# Patient Record
Sex: Male | Born: 1950 | Race: Asian | Hispanic: No | Marital: Married | Smoking: Never smoker
Health system: Southern US, Community
[De-identification: ages and names within clinical notes are randomized; demographics above are authoritative.]

## PROBLEM LIST (undated history)

## (undated) DIAGNOSIS — G8929 Other chronic pain: Secondary | ICD-10-CM

## (undated) DIAGNOSIS — N400 Enlarged prostate without lower urinary tract symptoms: Secondary | ICD-10-CM

## (undated) DIAGNOSIS — K219 Gastro-esophageal reflux disease without esophagitis: Secondary | ICD-10-CM

## (undated) DIAGNOSIS — M1990 Unspecified osteoarthritis, unspecified site: Secondary | ICD-10-CM

## (undated) DIAGNOSIS — E119 Type 2 diabetes mellitus without complications: Secondary | ICD-10-CM

## (undated) DIAGNOSIS — I10 Essential (primary) hypertension: Secondary | ICD-10-CM

## (undated) DIAGNOSIS — E782 Mixed hyperlipidemia: Secondary | ICD-10-CM

## (undated) DIAGNOSIS — M549 Dorsalgia, unspecified: Secondary | ICD-10-CM

## (undated) HISTORY — DX: Benign prostatic hyperplasia without lower urinary tract symptoms: N40.0

## (undated) HISTORY — DX: Type 2 diabetes mellitus without complications: E11.9

## (undated) HISTORY — DX: Essential (primary) hypertension: I10

## (undated) HISTORY — DX: Mixed hyperlipidemia: E78.2

## (undated) HISTORY — DX: Other chronic pain: M54.9

## (undated) HISTORY — DX: Other chronic pain: G89.29

## (undated) HISTORY — PX: CATARACT EXTRACTION: SUR2

---

## 2013-12-14 NOTE — Patient Instructions (Addendum)
Your procedure is scheduled on: 12/20/2013  Report to [facility] at  800   AM.  Call this number if you have problems the morning of surgery: [phone]   Do not eat food or drink liquids :After Midnight.      Take these medicines the morning of surgery with A SIP OF WATER: neurontin, claritin, zantac   Do not wear jewelry, make-up or nail polish.  Do not wear lotions, powders, or perfumes.   Do not shave 48 hours prior to surgery.  Do not bring valuables to the hospital.  Contacts, dentures or bridgework may not be worn into surgery.  Leave suitcase in the car. After surgery it may be brought to your room.  For patients admitted to the hospital, checkout time is 11:00 AM the day of discharge.   Patients discharged the day of surgery will not be allowed to drive home.  :     Please read over the following fact sheets that you were given: Coughing and Deep Breathing, Surgical Site Infection Prevention, Anesthesia Post-op Instructions and Care and Recovery After Surgery    Cataract A cataract is a clouding of the lens of the eye. When a lens becomes cloudy, vision is reduced based on the degree and nature of the clouding. Many cataracts reduce vision to some degree. Some cataracts make people more near-sighted as they develop. Other cataracts increase glare. Cataracts that are ignored and become worse can sometimes look white. The white color can be seen through the pupil. CAUSES   Aging. However, cataracts may occur at any age, even in newborns.   Certain drugs.   Trauma to the eye.   Certain diseases such as diabetes.   Specific eye diseases such as chronic inflammation inside the eye or a sudden attack of a rare form of glaucoma.   Inherited or acquired medical problems.  SYMPTOMS   Gradual, progressive drop in vision in the affected eye.   Severe, rapid visual loss. This most often happens when trauma is the cause.  DIAGNOSIS  To detect a cataract, an eye doctor examines  the lens. Cataracts are best diagnosed with an exam of the eyes with the pupils enlarged (dilated) by drops.  TREATMENT  For an early cataract, vision may improve by using different eyeglasses or stronger lighting. If that does not help your vision, surgery is the only effective treatment. A cataract needs to be surgically removed when vision loss interferes with your everyday activities, such as driving, reading, or watching TV. A cataract may also have to be removed if it prevents examination or treatment of another eye problem. Surgery removes the cloudy lens and usually replaces it with a substitute lens (intraocular lens, IOL).  At a time when both you and your doctor agree, the cataract will be surgically removed. If you have cataracts in both eyes, only one is usually removed at a time. This allows the operated eye to heal and be out of danger from any possible problems after surgery (such as infection or poor wound healing). In rare cases, a cataract may be doing damage to your eye. In these cases, your caregiver may advise surgical removal right away. The vast majority of people who have cataract surgery have better vision afterward. HOME CARE INSTRUCTIONS  If you are not planning surgery, you may be asked to do the following:  Use different eyeglasses.   Use stronger or brighter lighting.   Ask your eye doctor about reducing your medicine dose or  changing medicines if it is thought that a medicine caused your cataract. Changing medicines does not make the cataract go away on its own.   Become familiar with your surroundings. Poor vision can lead to injury. Avoid bumping into things on the affected side. You are at a higher risk for tripping or falling.   Exercise extreme care when driving or operating machinery.   Wear sunglasses if you are sensitive to bright light or experiencing problems with glare.  SEEK IMMEDIATE MEDICAL CARE IF:   You have a worsening or sudden vision loss.    You notice redness, swelling, or increasing pain in the eye.   You have a fever.  Document Released: 07/22/2005 Document Revised: 07/11/2011 Document Reviewed: 03/15/2011 [facility] Patient Information 2012 [place].PATIENT INSTRUCTIONS POST-ANESTHESIA  IMMEDIATELY FOLLOWING SURGERY:  Do not drive or operate machinery for the first twenty four hours after surgery.  Do not make any important decisions for twenty four hours after surgery or while taking narcotic pain medications or sedatives.  If you develop intractable nausea and vomiting or a severe headache please notify your doctor immediately.  FOLLOW-UP:  Please make an appointment with your surgeon as instructed. You do not need to follow up with anesthesia unless specifically instructed to do so.  WOUND CARE INSTRUCTIONS (if applicable):  Keep a dry clean dressing on the anesthesia/puncture wound site if there is drainage.  Once the wound has quit draining you may leave it open to air.  Generally you should leave the bandage intact for twenty four hours unless there is drainage.  If the epidural site drains for more than 36-48 hours please call the anesthesia department.  QUESTIONS?:  Please feel free to call your physician or the hospital operator if you have any questions, and they will be happy to assist you.

## 2013-12-15 ENCOUNTER — Encounter (HOSPITAL_COMMUNITY): Payer: Self-pay

## 2013-12-15 ENCOUNTER — Encounter (HOSPITAL_COMMUNITY)
Admission: RE | Admit: 2013-12-15 | Discharge: 2013-12-15 | Disposition: A | Discharge status: Home / Self Care | Source: Ambulatory Visit | Attending: Ophthalmology | Admitting: Ophthalmology

## 2013-12-15 ENCOUNTER — Encounter (HOSPITAL_COMMUNITY): Payer: Self-pay | Admitting: Pharmacy Technician

## 2013-12-15 DIAGNOSIS — Z01812 Encounter for preprocedural laboratory examination: Secondary | ICD-10-CM | POA: Insufficient documentation

## 2013-12-15 DIAGNOSIS — Z01810 Encounter for preprocedural cardiovascular examination: Secondary | ICD-10-CM | POA: Diagnosis not present

## 2013-12-15 HISTORY — DX: Gastro-esophageal reflux disease without esophagitis: K21.9

## 2013-12-15 HISTORY — DX: Type 2 diabetes mellitus without complications: E11.9

## 2013-12-15 HISTORY — DX: Unspecified osteoarthritis, unspecified site: M19.90

## 2013-12-15 LAB — BASIC METABOLIC PANEL
BUN: 27 mg/dL — ABNORMAL HIGH (ref 6–23)
CO2: 27 meq/L (ref 19–32)
Calcium: 10.4 mg/dL (ref 8.4–10.5)
Chloride: 100 mEq/L (ref 96–112)
Creatinine, Ser: 0.88 mg/dL (ref 0.50–1.35)
GFR calc Af Amer: >90 mL/min (ref >90)
GFR calc non Af Amer: >90 mL/min (ref >90)
Glucose, Bld: 207 mg/dL — ABNORMAL HIGH (ref 70–99)
Potassium: 5 mEq/L (ref 3.7–5.3)
SODIUM: 139 meq/L (ref 137–147)

## 2013-12-15 LAB — HEMOGLOBIN AND HEMATOCRIT, BLOOD
HEMATOCRIT: 40.3 % (ref 39.0–52.0)
HEMOGLOBIN: 13.6 g/dL (ref 13.0–17.0)

## 2013-12-15 NOTE — Pre-Procedure Instructions (Signed)
Patient given information to sign up for my chart at home. 

## 2013-12-20 ENCOUNTER — Ambulatory Visit (HOSPITAL_COMMUNITY): Admitting: Anesthesiology

## 2013-12-20 ENCOUNTER — Ambulatory Visit (HOSPITAL_COMMUNITY)
Admission: RE | Admit: 2013-12-20 | Discharge: 2013-12-20 | Disposition: A | Discharge status: Home / Self Care | Source: Ambulatory Visit | Attending: Ophthalmology | Admitting: Ophthalmology

## 2013-12-20 ENCOUNTER — Encounter (HOSPITAL_COMMUNITY): Admitting: Anesthesiology

## 2013-12-20 ENCOUNTER — Encounter (HOSPITAL_COMMUNITY)
Admission: RE | Disposition: A | Discharge status: Home / Self Care | Payer: Self-pay | Source: Ambulatory Visit | Attending: Ophthalmology

## 2013-12-20 ENCOUNTER — Encounter (HOSPITAL_COMMUNITY): Payer: Self-pay | Admitting: *Deleted

## 2013-12-20 DIAGNOSIS — H524 Presbyopia: Secondary | ICD-10-CM | POA: Insufficient documentation

## 2013-12-20 DIAGNOSIS — E1139 Type 2 diabetes mellitus with other diabetic ophthalmic complication: Secondary | ICD-10-CM | POA: Diagnosis not present

## 2013-12-20 DIAGNOSIS — K219 Gastro-esophageal reflux disease without esophagitis: Secondary | ICD-10-CM | POA: Insufficient documentation

## 2013-12-20 DIAGNOSIS — E11319 Type 2 diabetes mellitus with unspecified diabetic retinopathy without macular edema: Secondary | ICD-10-CM | POA: Insufficient documentation

## 2013-12-20 DIAGNOSIS — H52209 Unspecified astigmatism, unspecified eye: Secondary | ICD-10-CM | POA: Insufficient documentation

## 2013-12-20 DIAGNOSIS — I10 Essential (primary) hypertension: Secondary | ICD-10-CM | POA: Insufficient documentation

## 2013-12-20 DIAGNOSIS — H5210 Myopia, unspecified eye: Secondary | ICD-10-CM | POA: Insufficient documentation

## 2013-12-20 DIAGNOSIS — H2510 Age-related nuclear cataract, unspecified eye: Secondary | ICD-10-CM | POA: Diagnosis present

## 2013-12-20 HISTORY — PX: CATARACT EXTRACTION W/PHACO: SHX586

## 2013-12-20 LAB — GLUCOSE, CAPILLARY: GLUCOSE-CAPILLARY: 177 mg/dL — AB (ref 70–99)

## 2013-12-20 SURGERY — PHACOEMULSIFICATION, CATARACT, WITH IOL INSERTION
Anesthesia: Monitor Anesthesia Care | Site: Eye | Laterality: Left

## 2013-12-20 MED ORDER — LIDOCAINE HCL 3.5 % OP GEL
1.0000 "application " | Freq: Once | OPHTHALMIC | Status: AC
  Administered 2013-12-20: 1 via OPHTHALMIC

## 2013-12-20 MED ORDER — LIDOCAINE HCL 3.5 % OP GEL
OPHTHALMIC | Status: DC | PRN
  Administered 2013-12-20: 1 via OPHTHALMIC

## 2013-12-20 MED ORDER — CYCLOPENTOLATE-PHENYLEPHRINE OP SOLN OPTIME - NO CHARGE
OPHTHALMIC | Status: AC
  Filled 2013-12-20: qty 2

## 2013-12-20 MED ORDER — FENTANYL CITRATE 0.05 MG/ML IJ SOLN
25.0000 ug | INTRAMUSCULAR | Status: AC
  Administered 2013-12-20 (×2): 25 ug via INTRAVENOUS

## 2013-12-20 MED ORDER — FENTANYL CITRATE 0.05 MG/ML IJ SOLN
INTRAMUSCULAR | Status: AC
  Filled 2013-12-20: qty 2

## 2013-12-20 MED ORDER — CYCLOPENTOLATE-PHENYLEPHRINE 0.2-1 % OP SOLN
1.0000 [drp] | OPHTHALMIC | Status: AC
  Administered 2013-12-20 (×3): 1 [drp] via OPHTHALMIC

## 2013-12-20 MED ORDER — LACTATED RINGERS IV SOLN
INTRAVENOUS | Status: DC
  Administered 2013-12-20: 08:00:00 via INTRAVENOUS

## 2013-12-20 MED ORDER — POVIDONE-IODINE 5 % OP SOLN
OPHTHALMIC | Status: DC | PRN
  Administered 2013-12-20: 1 via OPHTHALMIC

## 2013-12-20 MED ORDER — MIDAZOLAM HCL 2 MG/2ML IJ SOLN
INTRAMUSCULAR | Status: AC
  Filled 2013-12-20: qty 2

## 2013-12-20 MED ORDER — EPINEPHRINE HCL 1 MG/ML IJ SOLN
INTRAOCULAR | Status: DC | PRN
  Administered 2013-12-20: 09:00:00

## 2013-12-20 MED ORDER — TETRACAINE HCL 0.5 % OP SOLN
OPHTHALMIC | Status: AC
  Filled 2013-12-20: qty 2

## 2013-12-20 MED ORDER — TETRACAINE HCL 0.5 % OP SOLN
1.0000 [drp] | OPHTHALMIC | Status: AC
  Administered 2013-12-20 (×3): 1 [drp] via OPHTHALMIC

## 2013-12-20 MED ORDER — MIDAZOLAM HCL 2 MG/2ML IJ SOLN
1.0000 mg | INTRAMUSCULAR | Status: DC | PRN
  Administered 2013-12-20: 2 mg via INTRAVENOUS

## 2013-12-20 MED ORDER — NA HYALUR & NA CHOND-NA HYALUR 0.55-0.5 ML IO KIT
PACK | INTRAOCULAR | Status: DC | PRN
  Administered 2013-12-20: 1 via OPHTHALMIC

## 2013-12-20 MED ORDER — BSS IO SOLN
INTRAOCULAR | Status: DC | PRN
  Administered 2013-12-20: 15 mL via INTRAOCULAR

## 2013-12-20 MED ORDER — LIDOCAINE HCL 3.5 % OP GEL
OPHTHALMIC | Status: AC
  Filled 2013-12-20: qty 1

## 2013-12-20 MED ORDER — TETRACAINE 0.5 % OP SOLN OPTIME - NO CHARGE
OPHTHALMIC | Status: DC | PRN
  Administered 2013-12-20: 1 [drp] via OPHTHALMIC

## 2013-12-20 MED ORDER — EPINEPHRINE HCL 1 MG/ML IJ SOLN
INTRAMUSCULAR | Status: AC
  Filled 2013-12-20: qty 1

## 2013-12-20 SURGICAL SUPPLY — 27 items
CAPSULAR TENSION RING-AMO (OPHTHALMIC RELATED) IMPLANT
CLOTH BEACON ORANGE TIMEOUT ST (SAFETY) ×3 IMPLANT
GLOVE BIO SURGEON STRL SZ7.5 (GLOVE) IMPLANT
GLOVE BIOGEL M 6.5 STRL (GLOVE) IMPLANT
GLOVE BIOGEL PI IND STRL 6.5 (GLOVE) ×1 IMPLANT
GLOVE BIOGEL PI IND STRL 7.0 (GLOVE) ×1 IMPLANT
GLOVE BIOGEL PI INDICATOR 6.5 (GLOVE) ×2
GLOVE BIOGEL PI INDICATOR 7.0 (GLOVE) ×2
GLOVE ECLIPSE 6.5 STRL STRAW (GLOVE) IMPLANT
GLOVE ECLIPSE 7.5 STRL STRAW (GLOVE) IMPLANT
GLOVE EXAM NITRILE LRG STRL (GLOVE) IMPLANT
GLOVE EXAM NITRILE MD LF STRL (GLOVE) IMPLANT
GLOVE SKINSENSE NS SZ6.5 (GLOVE)
GLOVE SKINSENSE NS SZ7.0 (GLOVE)
GLOVE SKINSENSE STRL SZ6.5 (GLOVE) IMPLANT
GLOVE SKINSENSE STRL SZ7.0 (GLOVE) IMPLANT
INST SET CATARACT ~~LOC~~ (KITS) ×3 IMPLANT
KIT VITRECTOMY (OPHTHALMIC RELATED) IMPLANT
PAD ARMBOARD 7.5X6 YLW CONV (MISCELLANEOUS) ×3 IMPLANT
PROC W NO LENS (INTRAOCULAR LENS)
PROC W SPEC LENS (INTRAOCULAR LENS)
PROCESS W NO LENS (INTRAOCULAR LENS) IMPLANT
PROCESS W SPEC LENS (INTRAOCULAR LENS) IMPLANT
RING MALYGIN (MISCELLANEOUS) IMPLANT
SIGHTPATH CAT PROC W REG LENS (Ophthalmic Related) ×3 IMPLANT
VISCOELASTIC ADDITIONAL (OPHTHALMIC RELATED) IMPLANT
WATER STERILE IRR 250ML POUR (IV SOLUTION) ×3 IMPLANT

## 2013-12-20 NOTE — H&P (Signed)
I have reviewed the pre printed H&P, the patient was re-examined, and I have identified no significant interval changes in the patient's medical condition.  There is no change in the plan of care since the history and physical of record. 

## 2013-12-20 NOTE — Op Note (Signed)
See scanned op note 

## 2013-12-20 NOTE — Transfer of Care (Signed)
Immediate Anesthesia Transfer of Care Note  Patient: Jimmy CowdenMuhammad Romm  Procedure(s) Performed: Procedure(s) with comments: CATARACT EXTRACTION PHACO AND INTRAOCULAR LENS PLACEMENT (IOC) (Left) - CDE: 0.97  Patient Location: Short Stay  Anesthesia Type:MAC  Level of Consciousness: awake, alert , oriented and patient cooperative  Airway & Oxygen Therapy: Patient Spontanous Breathing  Post-op Assessment: Report given to PACU RN, Post -op Vital signs reviewed and stable and Patient moving all extremities  Post vital signs: Reviewed and stable  Complications: No apparent anesthesia complications

## 2013-12-20 NOTE — Discharge Instructions (Signed)
Jimmy CowdenMuhammad Craig 12/20/2013 Dr. Lita MainsHaines Post operative Instructions for Cataract Patients  These instructions [facility] and pertain to the operative eye.  1.  Resume your normal diet and previous oral medicines.  2. Your Follow-up appointment is at Dr. Lita MainsHaines' office in [place] on 12/21/2013 at 2:45 pm.  3. You may leave the hospital when your driver is present and your nurse releases you.  4. Begin Pred Forte (prednisolone acetate 1%), Acular LS (ketorolac tromethamine .4%) and Gatifloxacin 0.5% eye drops; 1 drop each 4 times daily to operative eye. Begin 3 hours after discharge from Short Stay Unit.  Moxifloxacin 0.5% may be substituted for Gatifloxacin using the same instructions.  5. Page Dr. Lita MainsHaines via beeper [phone] for significant pain in or around operative eye that is not relieved by Tylenol.  6. If you took Plavix before surgery, restart it at the usual dose on the evening of surgery.  7. Wear dark glasses as necessary for excessive light sensitivity.  8. Do no forcefully rub you your operative eye.  9. Keep your operative eye dry for 1 week. You may gently clean your eyelids with a damp washcloth.  10. You may resume normal occupational activities in one week and resume driving as tolerated after the first post operative visit.  11. It is normal to have blurred vision and a scratchy sensation following surgery.  Dr. Lita MainsHaines: [phone]

## 2013-12-20 NOTE — Brief Op Note (Signed)
12/20/2013  9:47 AM  PATIENT:  Jimmy Craig  63 y.o. male  PRE-OPERATIVE DIAGNOSIS:  nuclear cataract left eye  POST-OPERATIVE DIAGNOSIS:  nuclear cataract left eye  PROCEDURE:  Procedure(s): CATARACT EXTRACTION PHACO AND INTRAOCULAR LENS PLACEMENT (IOC)  SURGEON:  Surgeon(s): Susa Simmondsarroll F Kenedee Molesky, MD  ASSISTANTS: Corky Singwendy Kendricfk, CST   ANESTHESIA STAFF: Anesthesiologist: Laurene FootmanLuis Gonzalez, MD CRNA: Despina Hiddenobert J Idacavage, CRNA  ANESTHESIA:   topical and MAC  REQUESTED LENS POWER: 17.50  LENS IMPLANT INFORMATION:  Alcon [id number]  +17.50  S/n [id number]  Exp 06/2014  CUMULATIVE DISSIPATED ENERGY:0.97  INDICATIONS:see office H&P  OP FINDINGS:dense NS  COMPLICATIONS:None  DICTATION #: none  PLAN OF CARE: as above  PATIENT DISPOSITION:  Short Stay

## 2013-12-20 NOTE — Anesthesia Postprocedure Evaluation (Signed)
  Anesthesia Post-op Note  Patient: Jimmy Craig  Procedure(s) Performed: Procedure(s) with comments: CATARACT EXTRACTION PHACO AND INTRAOCULAR LENS PLACEMENT (IOC) (Left) - CDE: 0.97  Patient Location: Short Stay  Anesthesia Type:MAC  Level of Consciousness: awake, alert , oriented and patient cooperative  Airway and Oxygen Therapy: Patient Spontanous Breathing  Post-op Pain: none  Post-op Assessment: Post-op Vital signs reviewed, Patient's Cardiovascular Status Stable, Respiratory Function Stable, Patent Airway and Pain level controlled  Post-op Vital Signs: Reviewed and stable  Last Vitals:  Filed Vitals:   12/20/13 0840  BP: 112/70  Temp:   Resp: 27    Complications: No apparent anesthesia complications

## 2013-12-20 NOTE — Anesthesia Preprocedure Evaluation (Addendum)
Anesthesia Evaluation  Patient identified by MRN, date of birth, ID band  Reviewed: Allergy & Precautions, H&P , NPO status , Patient's Chart, lab work & pertinent test results  Airway Mallampati: II TM Distance: >3 FB     Dental  (+) Teeth Intact   Pulmonary neg pulmonary ROS,  breath sounds clear to auscultation        Cardiovascular negative cardio ROS  Rhythm:Regular Rate:Normal     Neuro/Psych    GI/Hepatic GERD-  Medicated and Controlled,  Endo/Other  diabetes, Type 2, Oral Hypoglycemic Agents  Renal/GU      Musculoskeletal   Abdominal   Peds  Hematology   Anesthesia Other Findings   Reproductive/Obstetrics                         Anesthesia Physical Anesthesia Plan  ASA: II  Anesthesia Plan: MAC   Post-op Pain Management:    Induction: Intravenous  Airway Management Planned: Nasal Cannula  Additional Equipment:   Intra-op Plan:   Post-operative Plan:   Informed Consent: I have reviewed the patients History and Physical, chart, labs and discussed the procedure including the risks, benefits and alternatives for the proposed anesthesia with the patient or authorized representative who has indicated his/her understanding and acceptance.     Plan Discussed with:   Anesthesia Plan Comments:         Anesthesia Quick Evaluation

## 2013-12-21 ENCOUNTER — Encounter (HOSPITAL_COMMUNITY): Payer: Self-pay | Admitting: Ophthalmology

## 2014-11-25 ENCOUNTER — Encounter: Payer: Self-pay | Admitting: Family

## 2014-12-08 DIAGNOSIS — R0602 Shortness of breath: Secondary | ICD-10-CM | POA: Insufficient documentation

## 2014-12-13 ENCOUNTER — Encounter: Payer: Self-pay | Admitting: Family

## 2015-01-20 DIAGNOSIS — R42 Dizziness and giddiness: Secondary | ICD-10-CM | POA: Insufficient documentation

## 2015-08-30 ENCOUNTER — Encounter: Payer: Self-pay | Admitting: Family

## 2017-07-02 ENCOUNTER — Encounter: Payer: Self-pay | Admitting: Family

## 2017-08-08 DIAGNOSIS — M48061 Spinal stenosis, lumbar region without neurogenic claudication: Secondary | ICD-10-CM | POA: Insufficient documentation

## 2017-11-18 ENCOUNTER — Encounter: Payer: Self-pay | Admitting: Family

## 2018-08-14 ENCOUNTER — Ambulatory Visit (INDEPENDENT_AMBULATORY_CARE_PROVIDER_SITE_OTHER): Payer: PRIVATE HEALTH INSURANCE | Admitting: Family

## 2018-08-14 ENCOUNTER — Encounter: Payer: Self-pay | Admitting: Family

## 2018-08-14 VITALS — BP 134/73 | HR 78 | Temp 97.0°F | Ht 65.0 in | Wt 152.2 lb

## 2018-08-14 DIAGNOSIS — Z1159 Encounter for screening for other viral diseases: Secondary | ICD-10-CM

## 2018-08-14 DIAGNOSIS — E1169 Type 2 diabetes mellitus with other specified complication: Secondary | ICD-10-CM

## 2018-08-14 DIAGNOSIS — Z23 Encounter for immunization: Secondary | ICD-10-CM

## 2018-08-14 DIAGNOSIS — N401 Enlarged prostate with lower urinary tract symptoms: Secondary | ICD-10-CM | POA: Diagnosis not present

## 2018-08-14 DIAGNOSIS — K219 Gastro-esophageal reflux disease without esophagitis: Secondary | ICD-10-CM

## 2018-08-14 DIAGNOSIS — N400 Enlarged prostate without lower urinary tract symptoms: Secondary | ICD-10-CM | POA: Insufficient documentation

## 2018-08-14 DIAGNOSIS — E119 Type 2 diabetes mellitus without complications: Secondary | ICD-10-CM | POA: Insufficient documentation

## 2018-08-14 DIAGNOSIS — R351 Nocturia: Secondary | ICD-10-CM

## 2018-08-14 DIAGNOSIS — D509 Iron deficiency anemia, unspecified: Secondary | ICD-10-CM | POA: Insufficient documentation

## 2018-08-14 DIAGNOSIS — E785 Hyperlipidemia, unspecified: Secondary | ICD-10-CM

## 2018-08-14 LAB — BAYER DCA HB A1C WAIVED: HB A1C (BAYER DCA - WAIVED): 10.6 % — ABNORMAL HIGH (ref <7.0)

## 2018-08-14 MED ORDER — ESOMEPRAZOLE MAGNESIUM 40 MG PO CPDR: 40.0000 mg | DELAYED_RELEASE_CAPSULE | Freq: Every day | ORAL | 1 refills | Status: DC

## 2018-08-14 MED ORDER — ATORVASTATIN CALCIUM 40 MG PO TABS: 40.0000 mg | ORAL_TABLET | Freq: Every day | ORAL | 1 refills | Status: DC

## 2018-08-14 MED ORDER — BLOOD GLUCOSE METER KIT: PACK | 0 refills | Status: DC

## 2018-08-14 NOTE — Progress Notes (Signed)
Subjective:    Patient ID: Jimmy Craig, male    DOB: November 27, 1950, 68 y.o.   MRN: [id number]  Chief Complaint  Patient presents with  . New Patient (Initial Visit)   Pt presents to the office today to establish care.  Pt states he has not been on his medications because of costs, but now has Medicaid. He has been buying Human Insulin OTC at [facility] and giving himself 40-50 units BID.  Diabetes  He presents for his follow-up diabetic visit. He has type 2 diabetes mellitus. His disease course has been stable. There are no hypoglycemic associated symptoms. Associated symptoms include visual change. Pertinent negatives for diabetes include no blurred vision, no chest pain, no foot paresthesias and no weight loss. There are no hypoglycemic complications. Symptoms are stable. Diabetic complications include peripheral neuropathy. Pertinent negatives for diabetic complications include no CVA or heart disease. Risk factors for coronary artery disease include dyslipidemia, diabetes mellitus, male sex, hypertension and sedentary lifestyle. He is following a generally unhealthy diet. His overall blood glucose range is 180-200 mg/dl. Eye exam is current.  Hyperlipidemia  This is a chronic problem. The current episode started more than 1 year ago. The problem is uncontrolled. Recent lipid tests were reviewed and are high. Pertinent negatives include no chest pain. Current antihyperlipidemic treatment includes statins. The current treatment provides moderate improvement of lipids. Risk factors for coronary artery disease include dyslipidemia, diabetes mellitus, male sex, hypertension and a sedentary lifestyle.  Gastroesophageal Reflux  He reports no chest pain, no heartburn or no hoarse voice. This is a chronic problem. The current episode started more than 1 year ago. The problem occurs occasionally. The problem has been waxing and waning. The symptoms are aggravated by certain foods. Pertinent negatives  include no weight loss. He has tried a PPI for the symptoms. The treatment provided moderate relief.  Anemia  Presents for follow-up visit. Symptoms include malaise/fatigue. There has been no anorexia, bruising/bleeding easily or weight loss.  Arthritis  Presents for follow-up visit. He complains of pain and stiffness. The symptoms have been stable. Affected locations include the right MCP and left MCP. His pain is at a severity of 8/10. Pertinent negatives include no weight loss.  Benign Prostatic Hypertrophy  This is a recurrent problem. The current episode started more than 1 month ago. The problem has been waxing and waning since onset. Irritative symptoms include nocturia (2-4). Obstructive symptoms include an intermittent stream, a slower stream and straining.  COPD PT taking Symbicort BID.    Review of Systems  Constitutional: Positive for malaise/fatigue. Negative for weight loss.  HENT: Negative for hoarse voice.   Eyes: Negative for blurred vision.  Cardiovascular: Negative for chest pain.  Gastrointestinal: Negative for anorexia and heartburn.  Genitourinary: Positive for nocturia (2-4).  Musculoskeletal: Positive for arthritis and stiffness.  Hematological: Does not bruise/bleed easily.  All other systems reviewed and are negative.  Family History  Problem Relation Age of Onset  . Diabetes Mother     Social History   Socioeconomic History  . Marital status: Single    Spouse name: Not on file  . Number of children: Not on file  . Years of education: Not on file  . Highest education level: Not on file  Occupational History  . Not on file  Social Needs  . Financial resource strain: Not on file  . Food insecurity:    Worry: Not on file    Inability: Not on file  . Transportation needs:  Medical: Not on file    Non-medical: Not on file  Tobacco Use  . Smoking status: Never Smoker  . Smokeless tobacco: Never Used  Substance and Sexual Activity  . Alcohol use:  No  . Drug use: No  . Sexual activity: Yes    Birth control/protection: None  Lifestyle  . Physical activity:    Days per week: Not on file    Minutes per session: Not on file  . Stress: Not on file  Relationships  . Social connections:    Talks on phone: Not on file    Gets together: Not on file    Attends religious service: Not on file    Active member of club or organization: Not on file    Attends meetings of clubs or organizations: Not on file    Relationship status: Not on file  Other Topics Concern  . Not on file  Social History Narrative  . Not on file       Objective:   Physical Exam Vitals signs reviewed.  Constitutional:      General: He is not in acute distress.    Appearance: He is well-developed.  HENT:     Head: Normocephalic.     Right Ear: External ear normal.     Left Ear: External ear normal.  Eyes:     General:        Right eye: No discharge.        Left eye: No discharge.     Pupils: Pupils are equal, round, and reactive to light.  Neck:     Musculoskeletal: Normal range of motion and neck supple.     Thyroid: No thyromegaly.  Cardiovascular:     Rate and Rhythm: Normal rate and regular rhythm.     Heart sounds: Normal heart sounds. No murmur.  Pulmonary:     Effort: Pulmonary effort is normal. No respiratory distress.     Breath sounds: Decreased breath sounds present. No wheezing.  Abdominal:     General: Bowel sounds are normal. There is no distension.     Palpations: Abdomen is soft.     Tenderness: There is no abdominal tenderness.  Musculoskeletal: Normal range of motion.        General: No tenderness.  Skin:    General: Skin is warm and dry.     Findings: No erythema or rash.  Neurological:     Mental Status: He is alert and oriented to person, place, and time.     Cranial Nerves: No cranial nerve deficit.     Deep Tendon Reflexes: Reflexes are normal and symmetric.  Psychiatric:        Behavior: Behavior normal.         Thought Content: Thought content normal.        Judgment: Judgment normal.       BP 134/73   Pulse 78   Temp (!) 97 F (36.1 C) (Oral)   Ht 5' 5"  (1.651 m)   Wt 152 lb 3.2 oz (69 kg)   BMI 25.33 kg/m      Assessment & Plan:  Jimmy Craig comes in today with chief complaint of New Patient (Initial Visit)   Diagnosis and orders addressed:  1. Type 2 diabetes mellitus with other specified complication, without long-term current use of insulin (HCC) - CMP14+EGFR - Bayer DCA Hb A1c Waived - Microalbumin / creatinine urine ratio - blood glucose meter kit and supplies; Dispense based on patient and insurance preference. Use  up to four times daily as directed. (FOR ICD-10 E10.9, E11.9).  Dispense: 1 each; Refill: 0  2. Hyperlipidemia associated with type 2 diabetes mellitus (HCC) - CMP14+EGFR - Lipid panel - atorvastatin (LIPITOR) 40 MG tablet; Take 1 tablet (40 mg total) by mouth daily at 6 PM.  Dispense: 90 tablet; Refill: 1  3. GERD without esophagitis - CMP14+EGFR - esomeprazole (NEXIUM) 40 MG capsule; Take 1 capsule (40 mg total) by mouth daily at 12 noon.  Dispense: 90 capsule; Refill: 1  4. Benign prostatic hyperplasia with nocturia - CMP14+EGFR - PSA, total and free  5. Need for hepatitis C screening test - CMP14+EGFR - Hepatitis C antibody  6. Iron deficiency anemia, unspecified iron deficiency anemia type - Anemia Profile B  7. Encounter for immunization - Flu vaccine HIGH DOSE PF   Labs pending Health Maintenance reviewed- Will get Diabetic eye exam and colonoscopy results faxed to our office. Diet and exercise encouraged  Follow up plan: 1 month    Evelina Dun, FNP

## 2018-08-14 NOTE — Patient Instructions (Signed)
Diabetes Mellitus and Nutrition, Adult  When you have diabetes (diabetes mellitus), it is very important to have healthy eating habits because your blood sugar (glucose) levels are greatly affected by what you eat and drink. Eating healthy foods in the appropriate amounts, at about the same times every day, can help you:  · Control your blood glucose.  · Lower your risk of heart disease.  · Improve your blood pressure.  · Reach or maintain a healthy weight.  Every person with diabetes is different, and each person has different needs for a meal plan. Your health care provider may recommend that you work with a diet and nutrition specialist (dietitian) to make a meal plan that is best for you. Your meal plan may vary depending on factors such as:  · The calories you need.  · The medicines you take.  · Your weight.  · Your blood glucose, blood pressure, and cholesterol levels.  · Your activity level.  · Other health conditions you have, such as heart or kidney disease.  How do carbohydrates affect me?  Carbohydrates, also called carbs, affect your blood glucose level more than any other type of food. Eating carbs naturally raises the amount of glucose in your blood. Carb counting is a method for keeping track of how many carbs you eat. Counting carbs is important to keep your blood glucose at a healthy level, especially if you use insulin or take certain oral diabetes medicines.  It is important to know how many carbs you can safely have in each meal. This is different for every person. Your dietitian can help you calculate how many carbs you should have at each meal and for each snack.  Foods that contain carbs include:  · Bread, cereal, rice, pasta, and crackers.  · Potatoes and corn.  · Peas, beans, and lentils.  · Milk and yogurt.  · Fruit and juice.  · Desserts, such as cakes, cookies, ice cream, and candy.  How does alcohol affect me?  Alcohol can cause a sudden decrease in blood glucose (hypoglycemia),  especially if you use insulin or take certain oral diabetes medicines. Hypoglycemia can be a life-threatening condition. Symptoms of hypoglycemia (sleepiness, dizziness, and confusion) are similar to symptoms of having too much alcohol.  If your health care provider says that alcohol is safe for you, follow these guidelines:  · Limit alcohol intake to no more than 1 drink per day for nonpregnant women and 2 drinks per day for men. One drink equals 12 oz of beer, 5 oz of wine, or 1½ oz of hard liquor.  · Do not drink on an empty stomach.  · Keep yourself hydrated with water, diet soda, or unsweetened iced tea.  · Keep in mind that regular soda, juice, and other mixers may contain a lot of sugar and must be counted as carbs.  What are tips for following this plan?    Reading food labels  · Start by checking the serving size on the "Nutrition Facts" label of packaged foods and drinks. The amount of calories, carbs, fats, and other nutrients listed on the label is based on one serving of the item. Many items contain more than one serving per package.  · Check the total grams (g) of carbs in one serving. You can calculate the number of servings of carbs in one serving by dividing the total carbs by 15. For example, if a food has 30 g of total carbs, it would be equal to 2   servings of carbs.  · Check the number of grams (g) of saturated and trans fats in one serving. Choose foods that have low or no amount of these fats.  · Check the number of milligrams (mg) of salt (sodium) in one serving. Most people should limit total sodium intake to less than 2,300 mg per day.  · Always check the nutrition information of foods labeled as "low-fat" or "nonfat". These foods may be higher in added sugar or refined carbs and should be avoided.  · Talk to your dietitian to identify your daily goals for nutrients listed on the label.  Shopping  · Avoid buying canned, premade, or processed foods. These foods tend to be high in fat, sodium,  and added sugar.  · Shop around the outside edge of the grocery store. This includes fresh fruits and vegetables, bulk grains, fresh meats, and fresh dairy.  Cooking  · Use low-heat cooking methods, such as baking, instead of high-heat cooking methods like deep frying.  · Cook using healthy oils, such as olive, canola, or sunflower oil.  · Avoid cooking with butter, cream, or high-fat meats.  Meal planning  · Eat meals and snacks regularly, preferably at the same times every day. Avoid going long periods of time without eating.  · Eat foods high in fiber, such as fresh fruits, vegetables, beans, and whole grains. Talk to your dietitian about how many servings of carbs you can eat at each meal.  · Eat 4-6 ounces (oz) of lean protein each day, such as lean meat, chicken, fish, eggs, or tofu. One oz of lean protein is equal to:  ? 1 oz of meat, chicken, or fish.  ? 1 egg.  ? ¼ cup of tofu.  · Eat some foods each day that contain healthy fats, such as avocado, nuts, seeds, and fish.  Lifestyle  · Check your blood glucose regularly.  · Exercise regularly as told by your health care provider. This may include:  ? 150 minutes of moderate-intensity or vigorous-intensity exercise each week. This could be brisk walking, biking, or water aerobics.  ? Stretching and doing strength exercises, such as yoga or weightlifting, at least 2 times a week.  · Take medicines as told by your health care provider.  · Do not use any products that contain nicotine or tobacco, such as cigarettes and e-cigarettes. If you need help quitting, ask your health care provider.  · Work with a counselor or diabetes educator to identify strategies to manage stress and any emotional and social challenges.  Questions to ask a health care provider  · Do I need to meet with a diabetes educator?  · Do I need to meet with a dietitian?  · What number can I call if I have questions?  · When are the best times to check my blood glucose?  Where to find more  information:  · American Diabetes Association: diabetes.org  · Academy of Nutrition and Dietetics: www.eatright.org  · National Institute of Diabetes and Digestive and Kidney Diseases (NIH): www.niddk.nih.gov  Summary  · A healthy meal plan will help you control your blood glucose and maintain a healthy lifestyle.  · Working with a diet and nutrition specialist (dietitian) can help you make a meal plan that is best for you.  · Keep in mind that carbohydrates (carbs) and alcohol have immediate effects on your blood glucose levels. It is important to count carbs and to use alcohol carefully.  This information is not intended to   replace advice given to you by your health care provider. Make sure you discuss any questions you have with your health care provider.  Document Released: 04/18/2005 Document Revised: 02/19/2017 Document Reviewed: 08/26/2016  Elsevier Interactive Patient Education © 2019 Elsevier Inc.

## 2018-08-15 LAB — ANEMIA PROFILE B
BASOS: 1 %
Basophils Absolute: 0.1 10*3/uL (ref 0.0–0.2)
EOS (ABSOLUTE): 0.1 10*3/uL (ref 0.0–0.4)
Eos: 2 %
FERRITIN: 75 ng/mL (ref 30–400)
Folate: >20 ng/mL (ref >3.0)
HEMOGLOBIN: 13.5 g/dL (ref 13.0–17.7)
Hematocrit: 39.8 % (ref 37.5–51.0)
IMMATURE GRANS (ABS): 0 10*3/uL (ref 0.0–0.1)
IRON SATURATION: 23 % (ref 15–55)
IRON: 84 ug/dL (ref 38–169)
Immature Granulocytes: 0 %
LYMPHS: 38 %
Lymphocytes Absolute: 2.2 10*3/uL (ref 0.7–3.1)
MCH: 30.2 pg (ref 26.6–33.0)
MCHC: 33.9 g/dL (ref 31.5–35.7)
MCV: 89 fL (ref 79–97)
Monocytes Absolute: 0.5 10*3/uL (ref 0.1–0.9)
Monocytes: 9 %
Neutrophils Absolute: 2.9 10*3/uL (ref 1.4–7.0)
Neutrophils: 50 %
Platelets: 309 10*3/uL (ref 150–450)
RBC: 4.47 x10E6/uL (ref 4.14–5.80)
RDW: 12.6 % (ref 11.6–15.4)
RETIC CT PCT: 1.4 % (ref 0.6–2.6)
TIBC: 373 ug/dL (ref 250–450)
UIBC: 289 ug/dL (ref 111–343)
Vitamin B-12: >2000 pg/mL — ABNORMAL HIGH (ref 232–1245)
WBC: 5.8 10*3/uL (ref 3.4–10.8)

## 2018-08-15 LAB — CMP14+EGFR
ALT: 45 IU/L — ABNORMAL HIGH (ref 0–44)
AST: 29 IU/L (ref 0–40)
Albumin/Globulin Ratio: 1.6 (ref 1.2–2.2)
Albumin: 4.6 g/dL (ref 3.6–4.8)
Alkaline Phosphatase: 89 IU/L (ref 39–117)
BILIRUBIN TOTAL: 0.3 mg/dL (ref 0.0–1.2)
BUN/Creatinine Ratio: 24 (ref 10–24)
BUN: 24 mg/dL (ref 8–27)
CO2: 22 mmol/L (ref 20–29)
CREATININE: 0.98 mg/dL (ref 0.76–1.27)
Calcium: 9.8 mg/dL (ref 8.6–10.2)
Chloride: 98 mmol/L (ref 96–106)
GFR calc Af Amer: 93 mL/min/{1.73_m2} (ref >59)
GFR calc non Af Amer: 81 mL/min/{1.73_m2} (ref >59)
GLOBULIN, TOTAL: 2.9 g/dL (ref 1.5–4.5)
Glucose: 294 mg/dL — ABNORMAL HIGH (ref 65–99)
Potassium: 5 mmol/L (ref 3.5–5.2)
Sodium: 138 mmol/L (ref 134–144)
TOTAL PROTEIN: 7.5 g/dL (ref 6.0–8.5)

## 2018-08-15 LAB — LIPID PANEL
Chol/HDL Ratio: 6.8 ratio — ABNORMAL HIGH (ref 0.0–5.0)
Cholesterol, Total: 298 mg/dL — ABNORMAL HIGH (ref 100–199)
HDL: 44 mg/dL (ref >39)
LDL CALC: 190 mg/dL — AB (ref 0–99)
TRIGLYCERIDES: 320 mg/dL — AB (ref 0–149)
VLDL Cholesterol Cal: 64 mg/dL — ABNORMAL HIGH (ref 5–40)

## 2018-08-15 LAB — HEPATITIS C ANTIBODY: Hep C Virus Ab: <0.1 s/co ratio (ref 0.0–0.9)

## 2018-08-15 LAB — PSA, TOTAL AND FREE
PSA, Free Pct: 36.7 %
PSA, Free: 0.11 ng/mL
Prostate Specific Ag, Serum: 0.3 ng/mL (ref 0.0–4.0)

## 2018-08-18 ENCOUNTER — Other Ambulatory Visit: Payer: Self-pay | Admitting: Family

## 2018-08-18 MED ORDER — METFORMIN HCL 1000 MG PO TABS: 1000.0000 mg | ORAL_TABLET | Freq: Two times a day (BID) | ORAL | 3 refills | Status: DC

## 2018-08-18 MED ORDER — INSULIN DEGLUDEC 100 UNIT/ML ~~LOC~~ SOPN: 15.0000 [IU] | PEN_INJECTOR | Freq: Every day | SUBCUTANEOUS | 2 refills | Status: DC

## 2018-08-20 ENCOUNTER — Telehealth: Payer: Self-pay

## 2018-08-20 ENCOUNTER — Telehealth: Payer: Self-pay | Admitting: Family

## 2018-08-20 LAB — HM DIABETES EYE EXAM

## 2018-08-20 MED ORDER — INSULIN DETEMIR 100 UNIT/ML FLEXPEN: 15.0000 [IU] | PEN_INJECTOR | Freq: Every day | SUBCUTANEOUS | 11 refills | Status: DC

## 2018-08-20 NOTE — Telephone Encounter (Signed)
His blood pressure was 162/82 and 154/78 yesterday.  Advised to decrease sodium and keepa log to report back to provider.  He still suffers with shoulder pain but was advised to stop tylenol due to liver results.  Please send in medicine to help with this pain.  This has been treated at the urgent care before. Thanks

## 2018-08-20 NOTE — Telephone Encounter (Signed)
Insurance denied prior auth for Tresiba  Needs to fail [employer] and [employer]

## 2018-08-20 NOTE — Telephone Encounter (Signed)
[name] changed to Jimmy Craig per insurance. Continue with 15 units every day.

## 2018-08-20 NOTE — Telephone Encounter (Signed)
Patient aware and verbalizes understanding. 

## 2018-08-21 ENCOUNTER — Telehealth: Payer: Self-pay | Admitting: Family

## 2018-08-21 DIAGNOSIS — E1169 Type 2 diabetes mellitus with other specified complication: Secondary | ICD-10-CM

## 2018-08-21 MED ORDER — DICLOFENAC SODIUM 75 MG PO TBEC: 75.0000 mg | DELAYED_RELEASE_TABLET | Freq: Two times a day (BID) | ORAL | 1 refills | Status: DC

## 2018-08-21 NOTE — Telephone Encounter (Signed)
Patient needs his insulin "flex pen needles" called in to [facility].

## 2018-08-21 NOTE — Telephone Encounter (Signed)
Pt aware.

## 2018-08-21 NOTE — Telephone Encounter (Signed)
Diclofenac Prescription sent to pharmacy. This is a NSAID, so do not take any other motrin or ibuprofen with this. Pain can cause increase in blood pressure. If it continues to be elevated we will need to see you in the office. Please make sure he has an appointment to see me in one month from his last appointment.

## 2018-08-24 MED ORDER — PEN NEEDLES 32G X 4 MM MISC: 1.0000 "application " | Freq: Every day | 11 refills | Status: DC

## 2018-08-24 MED ORDER — LISINOPRIL 10 MG PO TABS: 10.0000 mg | ORAL_TABLET | Freq: Every day | ORAL | 3 refills | Status: DC

## 2018-08-24 NOTE — Telephone Encounter (Signed)
PT in the office today with wife. His BP was 151/82, heart rate 94. He states he has been monitoring his BP at [facility] and it was been 150's-160's every time. He is worried about this.  We will send in Lisinopril 10 mg.

## 2018-09-17 ENCOUNTER — Telehealth: Payer: Self-pay | Admitting: Family

## 2018-09-25 NOTE — Telephone Encounter (Signed)
Multiple attempts made to contact patient.  Has upcoming appt on 09/29/2018.  This encounter will now be closed

## 2018-09-29 ENCOUNTER — Ambulatory Visit (INDEPENDENT_AMBULATORY_CARE_PROVIDER_SITE_OTHER): Payer: PRIVATE HEALTH INSURANCE | Admitting: Family

## 2018-09-29 ENCOUNTER — Encounter: Payer: Self-pay | Admitting: Family

## 2018-09-29 VITALS — BP 140/71 | HR 69 | Temp 97.3°F | Ht 65.0 in | Wt 148.8 lb

## 2018-09-29 DIAGNOSIS — M7582 Other shoulder lesions, left shoulder: Secondary | ICD-10-CM

## 2018-09-29 DIAGNOSIS — E1169 Type 2 diabetes mellitus with other specified complication: Secondary | ICD-10-CM

## 2018-09-29 DIAGNOSIS — I10 Essential (primary) hypertension: Secondary | ICD-10-CM

## 2018-09-29 DIAGNOSIS — E1159 Type 2 diabetes mellitus with other circulatory complications: Secondary | ICD-10-CM

## 2018-09-29 DIAGNOSIS — I152 Hypertension secondary to endocrine disorders: Secondary | ICD-10-CM | POA: Insufficient documentation

## 2018-09-29 MED ORDER — FLUTICASONE PROPIONATE 50 MCG/ACT NA SUSP: 2.0000 | Freq: Two times a day (BID) | NASAL | 1 refills | Status: DC

## 2018-09-29 MED ORDER — INSULIN DETEMIR 100 UNIT/ML FLEXPEN: 50.0000 [IU] | PEN_INJECTOR | Freq: Every day | SUBCUTANEOUS | 11 refills | Status: DC

## 2018-09-29 MED ORDER — CETIRIZINE HCL 10 MG PO TABS: 10.0000 mg | ORAL_TABLET | Freq: Every day | ORAL | 2 refills | Status: DC

## 2018-09-29 MED ORDER — ERTUGLIFLOZIN L-PYROGLUTAMICAC 15 MG PO TABS: 15.0000 mg | ORAL_TABLET | Freq: Every day | ORAL | 5 refills | Status: DC

## 2018-09-29 MED ORDER — LISINOPRIL 10 MG PO TABS: 10.0000 mg | ORAL_TABLET | Freq: Every day | ORAL | 3 refills | Status: DC

## 2018-09-29 MED ORDER — INSULIN DEGLUDEC 100 UNIT/ML ~~LOC~~ SOPN: 45.0000 [IU] | PEN_INJECTOR | Freq: Every day | SUBCUTANEOUS | 11 refills | Status: DC

## 2018-09-29 NOTE — Patient Instructions (Signed)
Diabetes Mellitus and Nutrition, Adult  When you have diabetes (diabetes mellitus), it is very important to have healthy eating habits because your blood sugar (glucose) levels are greatly affected by what you eat and drink. Eating healthy foods in the appropriate amounts, at about the same times every day, can help you:  · Control your blood glucose.  · Lower your risk of heart disease.  · Improve your blood pressure.  · Reach or maintain a healthy weight.  Every person with diabetes is different, and each person has different needs for a meal plan. Your health care provider may recommend that you work with a diet and nutrition specialist (dietitian) to make a meal plan that is best for you. Your meal plan may vary depending on factors such as:  · The calories you need.  · The medicines you take.  · Your weight.  · Your blood glucose, blood pressure, and cholesterol levels.  · Your activity level.  · Other health conditions you have, such as heart or kidney disease.  How do carbohydrates affect me?  Carbohydrates, also called carbs, affect your blood glucose level more than any other type of food. Eating carbs naturally raises the amount of glucose in your blood. Carb counting is a method for keeping track of how many carbs you eat. Counting carbs is important to keep your blood glucose at a healthy level, especially if you use insulin or take certain oral diabetes medicines.  It is important to know how many carbs you can safely have in each meal. This is different for every person. Your dietitian can help you calculate how many carbs you should have at each meal and for each snack.  Foods that contain carbs include:  · Bread, cereal, rice, pasta, and crackers.  · Potatoes and corn.  · Peas, beans, and lentils.  · Milk and yogurt.  · Fruit and juice.  · Desserts, such as cakes, cookies, ice cream, and candy.  How does alcohol affect me?  Alcohol can cause a sudden decrease in blood glucose (hypoglycemia),  especially if you use insulin or take certain oral diabetes medicines. Hypoglycemia can be a life-threatening condition. Symptoms of hypoglycemia (sleepiness, dizziness, and confusion) are similar to symptoms of having too much alcohol.  If your health care provider says that alcohol is safe for you, follow these guidelines:  · Limit alcohol intake to no more than 1 drink per day for nonpregnant women and 2 drinks per day for men. One drink equals 12 oz of beer, 5 oz of wine, or 1½ oz of hard liquor.  · Do not drink on an empty stomach.  · Keep yourself hydrated with water, diet soda, or unsweetened iced tea.  · Keep in mind that regular soda, juice, and other mixers may contain a lot of sugar and must be counted as carbs.  What are tips for following this plan?    Reading food labels  · Start by checking the serving size on the "Nutrition Facts" label of packaged foods and drinks. The amount of calories, carbs, fats, and other nutrients listed on the label is based on one serving of the item. Many items contain more than one serving per package.  · Check the total grams (g) of carbs in one serving. You can calculate the number of servings of carbs in one serving by dividing the total carbs by 15. For example, if a food has 30 g of total carbs, it would be equal to 2   servings of carbs.  · Check the number of grams (g) of saturated and trans fats in one serving. Choose foods that have low or no amount of these fats.  · Check the number of milligrams (mg) of salt (sodium) in one serving. Most people should limit total sodium intake to less than 2,300 mg per day.  · Always check the nutrition information of foods labeled as "low-fat" or "nonfat". These foods may be higher in added sugar or refined carbs and should be avoided.  · Talk to your dietitian to identify your daily goals for nutrients listed on the label.  Shopping  · Avoid buying canned, premade, or processed foods. These foods tend to be high in fat, sodium,  and added sugar.  · Shop around the outside edge of the grocery store. This includes fresh fruits and vegetables, bulk grains, fresh meats, and fresh dairy.  Cooking  · Use low-heat cooking methods, such as baking, instead of high-heat cooking methods like deep frying.  · Cook using healthy oils, such as olive, canola, or sunflower oil.  · Avoid cooking with butter, cream, or high-fat meats.  Meal planning  · Eat meals and snacks regularly, preferably at the same times every day. Avoid going long periods of time without eating.  · Eat foods high in fiber, such as fresh fruits, vegetables, beans, and whole grains. Talk to your dietitian about how many servings of carbs you can eat at each meal.  · Eat 4-6 ounces (oz) of lean protein each day, such as lean meat, chicken, fish, eggs, or tofu. One oz of lean protein is equal to:  ? 1 oz of meat, chicken, or fish.  ? 1 egg.  ? ¼ cup of tofu.  · Eat some foods each day that contain healthy fats, such as avocado, nuts, seeds, and fish.  Lifestyle  · Check your blood glucose regularly.  · Exercise regularly as told by your health care provider. This may include:  ? 150 minutes of moderate-intensity or vigorous-intensity exercise each week. This could be brisk walking, biking, or water aerobics.  ? Stretching and doing strength exercises, such as yoga or weightlifting, at least 2 times a week.  · Take medicines as told by your health care provider.  · Do not use any products that contain nicotine or tobacco, such as cigarettes and e-cigarettes. If you need help quitting, ask your health care provider.  · Work with a counselor or diabetes educator to identify strategies to manage stress and any emotional and social challenges.  Questions to ask a health care provider  · Do I need to meet with a diabetes educator?  · Do I need to meet with a dietitian?  · What number can I call if I have questions?  · When are the best times to check my blood glucose?  Where to find more  information:  · American Diabetes Association: diabetes.org  · Academy of Nutrition and Dietetics: www.eatright.org  · National Institute of Diabetes and Digestive and Kidney Diseases (NIH): www.niddk.nih.gov  Summary  · A healthy meal plan will help you control your blood glucose and maintain a healthy lifestyle.  · Working with a diet and nutrition specialist (dietitian) can help you make a meal plan that is best for you.  · Keep in mind that carbohydrates (carbs) and alcohol have immediate effects on your blood glucose levels. It is important to count carbs and to use alcohol carefully.  This information is not intended to   replace advice given to you by your health care provider. Make sure you discuss any questions you have with your health care provider.  Document Released: 04/18/2005 Document Revised: 02/19/2017 Document Reviewed: 08/26/2016  Elsevier Interactive Patient Education © 2019 Elsevier Inc.

## 2018-09-29 NOTE — Progress Notes (Signed)
Subjective:    Patient ID: Jimmy Craig, male    DOB: [birth year], 68 y.o.   MRN: [id number]  Chief Complaint  Patient presents with  . Diabetes    one month recheck   PT presents to the office today to recheck diabetes. He was seen on 08/14/18 with an A1C pf 10.6. We added Levemir 15 units nightly.   He states he blood sugars were elevated so he increased his Levemir to 25 units BID.  Diabetes  He presents for his follow-up diabetic visit. He has type 2 diabetes mellitus. His disease course has been stable. There are no hypoglycemic associated symptoms. Pertinent negatives for diabetes include no blurred vision, no foot paresthesias and no visual change. Symptoms are stable. Pertinent negatives for diabetic complications include no heart disease or nephropathy. His overall blood glucose range is 140-180 mg/dl.  Hypertension  This is a chronic problem. The current episode started more than 1 year ago. The problem has been resolved since onset. The problem is controlled. Pertinent negatives include no blurred vision, peripheral edema or shortness of breath. Risk factors for coronary artery disease include dyslipidemia, diabetes mellitus and sedentary lifestyle. The current treatment provides moderate improvement.  Shoulder Pain   The pain is present in the left shoulder. This is a recurrent problem. The current episode started more than 1 month ago. There has been no history of extremity trauma. The problem occurs intermittently. The problem has been waxing and waning. The quality of the pain is described as aching. The pain is at a severity of 5/10. He has tried acetaminophen for the symptoms.      Review of Systems  Eyes: Negative for blurred vision.  Respiratory: Negative for shortness of breath.   All other systems reviewed and are negative.      Objective:   Physical Exam Vitals signs reviewed.  Constitutional:      General: He is not in acute distress.    Appearance: He is  well-developed.  HENT:     Head: Normocephalic.     Right Ear: Tympanic membrane normal.     Left Ear: Tympanic membrane normal.  Eyes:     General:        Right eye: No discharge.        Left eye: No discharge.     Pupils: Pupils are equal, round, and reactive to light.  Neck:     Musculoskeletal: Normal range of motion and neck supple.     Thyroid: No thyromegaly.  Cardiovascular:     Rate and Rhythm: Normal rate and regular rhythm.     Heart sounds: Normal heart sounds. No murmur.  Pulmonary:     Effort: Pulmonary effort is normal. No respiratory distress.     Breath sounds: Normal breath sounds. No wheezing.  Abdominal:     General: Bowel sounds are normal. There is no distension.     Palpations: Abdomen is soft.     Tenderness: There is no abdominal tenderness.  Musculoskeletal: Normal range of motion.        General: No tenderness.  Skin:    General: Skin is warm and dry.     Findings: No erythema or rash.  Neurological:     Mental Status: He is alert and oriented to person, place, and time.     Cranial Nerves: No cranial nerve deficit.     Deep Tendon Reflexes: Reflexes are normal and symmetric.  Psychiatric:        Behavior: Behavior  normal.        Thought Content: Thought content normal.        Judgment: Judgment normal.       BP 140/71   Pulse 69   Temp (!) 97.3 F (36.3 C) (Oral)   Ht '5\' 5"'$  (1.651 m)   Wt 148 lb 12.8 oz (67.5 kg)   BMI 24.76 kg/m      Assessment & Plan:  Jimmy Craig comes in today with chief complaint of Diabetes (one month recheck)   Diagnosis and orders addressed:  1. Type 2 diabetes mellitus with other specified complication, without long-term current use of insulin ([place]) Will add Steglatro today Will change Levemir to [place], will decrease to 45 units daily from 50 units. Coupon card given.  Strict low carb diet  Follow up in 1 month  - CMP14+EGFR - Microalbumin / creatinine urine ratio - Ertugliflozin  L-PyroglutamicAc (STEGLATRO) 15 MG TABS; Take 15 mg by mouth daily.  Dispense: 30 tablet; Refill: 5  2. Hypertension associated with diabetes ([place]) - CMP14+EGFR - lisinopril (PRINIVIL,ZESTRIL) 10 MG tablet; Take 1 tablet (10 mg total) by mouth daily.  Dispense: 90 tablet; Refill: 3  3. Tendinitis of left rotator cuff Continue Diclofenac 75 mg BID with food - Ambulatory referral to Physical Therapy    Evelina Dun, FNP

## 2018-09-30 LAB — CMP14+EGFR
ALT: 42 IU/L (ref 0–44)
AST: 29 IU/L (ref 0–40)
Albumin/Globulin Ratio: 1.8 (ref 1.2–2.2)
Albumin: 4.4 g/dL (ref 3.8–4.8)
Alkaline Phosphatase: 79 IU/L (ref 39–117)
BUN/Creatinine Ratio: 27 — ABNORMAL HIGH (ref 10–24)
BUN: 26 mg/dL (ref 8–27)
Bilirubin Total: 0.2 mg/dL (ref 0.0–1.2)
CO2: 23 mmol/L (ref 20–29)
CREATININE: 0.98 mg/dL (ref 0.76–1.27)
Calcium: 9.6 mg/dL (ref 8.6–10.2)
Chloride: 101 mmol/L (ref 96–106)
GFR calc Af Amer: 93 mL/min/{1.73_m2} (ref >59)
GFR calc non Af Amer: 81 mL/min/{1.73_m2} (ref >59)
GLOBULIN, TOTAL: 2.5 g/dL (ref 1.5–4.5)
Glucose: 169 mg/dL — ABNORMAL HIGH (ref 65–99)
Potassium: 4.9 mmol/L (ref 3.5–5.2)
Sodium: 140 mmol/L (ref 134–144)
Total Protein: 6.9 g/dL (ref 6.0–8.5)

## 2018-09-30 LAB — MICROALBUMIN / CREATININE URINE RATIO
CREATININE, UR: 211.8 mg/dL
Microalb/Creat Ratio: 16 mg/g creat (ref 0–29)
Microalbumin, Urine: 33.6 ug/mL

## 2018-10-06 ENCOUNTER — Ambulatory Visit: Payer: PRIVATE HEALTH INSURANCE | Attending: Family | Admitting: Physical Therapy

## 2018-10-06 ENCOUNTER — Encounter: Payer: Self-pay | Admitting: Physical Therapy

## 2018-10-06 DIAGNOSIS — G8929 Other chronic pain: Secondary | ICD-10-CM | POA: Insufficient documentation

## 2018-10-06 DIAGNOSIS — M25512 Pain in left shoulder: Secondary | ICD-10-CM | POA: Insufficient documentation

## 2018-10-06 NOTE — Therapy (Signed)
[facility] [address] [place], [place], [address] Phone: [phone]   Fax:  [phone]  Physical Therapy Evaluation  Patient Details  Name: Jimmy Craig MRN: [id number] Date of Birth: 1950/09/08 Referring Provider (PT): Jannifer Rodney   Encounter Date: 10/06/2018  PT End of Session - 10/06/18 1359    Visit Number  1    Number of Visits  12    Date for PT Re-Evaluation  11/17/18    PT Start Time  0110    PT Stop Time  0154    PT Time Calculation (min)  44 min    Activity Tolerance  Patient tolerated treatment well    Behavior During Therapy  [facility] for tasks assessed/performed       Past Medical History:  Diagnosis Date  . Arthritis   . Diabetes mellitus without complication (HCC)   . GERD (gastroesophageal reflux disease)     Past Surgical History:  Procedure Laterality Date  . CATARACT EXTRACTION Right   . CATARACT EXTRACTION W/PHACO Left 12/20/2013   Procedure: CATARACT EXTRACTION PHACO AND INTRAOCULAR LENS PLACEMENT (IOC);  Surgeon: Susa Simmonds, MD;  Location: [facility];  Service: Ophthalmology;  Laterality: Left;  CDE: 0.97    There were no vitals filed for this visit.   Subjective Assessment - 10/06/18 1337    Subjective  The patient presents to PT with c/o left sided neck and shoulder pain.  He reports in July of 2019 lifting something when he felt like a bee sting in his left arm.  He thought the pain would go away but it did not.  He also reports a feeling of "ants" crawling on his left arm.  His pain is rated at a 6/10 and can rise a bit higher when lifting boxes (10-15#) at work.  rest decreases pain.    Pertinent History  DM, arthritis.    Patient Stated Goals  Get out of pain.    Currently in Pain?  Yes    Pain Score  6     Pain Location  Shoulder    Pain Orientation  Left    Pain Descriptors / Indicators  --   Pain.   Pain Type  Acute pain    Pain Frequency  Intermittent    Aggravating Factors   See  above.    Pain Relieving Factors  See above.         [facility] PT Assessment - 10/06/18 0001      Assessment   Medical Diagnosis  Tendonitis of left rotator cuff.    Referring Provider (PT)  Jannifer Rodney    Onset Date/Surgical Date  --   July 2019.     Precautions   Precautions  None      Restrictions   Weight Bearing Restrictions  No      Balance Screen   Has the patient fallen in the past 6 months  No    Has the patient had a decrease in activity level because of a fear of falling?   No    Is the patient reluctant to leave their home because of a fear of falling?   No      Home Environment   Living Environment  Private residence      Prior Function   Level of Independence  Independent      Posture/Postural Control   Posture/Postural Control  Postural limitations    Postural Limitations  Rounded Shoulders;Forward head    Posture Comments  --  Protracted scapulae.     Deep Tendon Reflexes   DTR Assessment Site  Biceps;Brachioradialis;Triceps   Bilateral.   Biceps DTR  0    Brachioradialis DTR  2+    Triceps DTR  2+      ROM / Strength   AROM / PROM / Strength  AROM;Strength      AROM   Overall AROM Comments  Full active left shoulder range of motion.  Active left cervical rotation= 45 degrees and right= 60 degrees.      Strength   Overall Strength Comments  Essentially normal left shoulder strength.      Palpation   Palpation comment  Tender over left cervical paraspinal musculature and left UT.  Mild pain complaints along left scapular border.        Special Tests    Special Tests  Rotator Cuff Impingement    Rotator Cuff Impingment tests  Neer impingement test;Hawkins- Kennedy test      Neer Impingement test    Findings  Negative    Side  Left      Hawkins-Kennedy test   Findings  Negative    Side  Left      Ambulation/Gait   Gait Comments  WNL.                Objective measurements completed on examination: See above findings.       [facility] Adult PT Treatment/Exercise - 10/06/18 0001      Modalities   Modalities  Electrical Stimulation;Moist Heat      Moist Heat Therapy   Number Minutes Moist Heat  15 Minutes    Moist Heat Location  Cervical      Electrical Stimulation   Electrical Stimulation Location  Left cervical/medial scap border and left UT    Electrical Stimulation Action  IFC    Electrical Stimulation Parameters  80-150 Hz x 15 minutes.    Electrical Stimulation Goals  Tone;Pain               PT Short Term Goals - 10/06/18 1426      PT SHORT TERM GOAL #1   Title  STG's=LTG's.        PT Long Term Goals - 10/06/18 1426      PT LONG TERM GOAL #1   Title  Ind with a HEP.    Time  6    Period  Weeks    Status  New      PT LONG TERM GOAL #2   Title  Left cervical rotation= 60 degrees.    Time  6    Period  Weeks    Status  New      PT LONG TERM GOAL #3   Title  Perform ADL's with pain not > 2/10.    Time  6    Period  Weeks    Status  New             Plan - 10/06/18 1407    Clinical Impression Statement  The patient presents to OPPT with an onset of left shoulder pain after lifting an object in July of 2019.  He is also reporting left sided neck pain and a felling likes "ants" crawling of his left UE.  He was found to have limited cervical rotation bilaterally.  He essentially has full left shoulder active range of motion.  Left shoulder special testing is negative.  He is tender over his left cervical paraspinal musculature, medial scpaular border and  UT.  Patient will benefit from skilled physical therapy intervention to address deficits and pain.    Personal Factors and Comorbidities  Comorbidity 1    Comorbidities  DM, arthritis.    Stability/Clinical Decision Making  Stable/Uncomplicated    Clinical Decision Making  Low    Rehab Potential  Good    PT Frequency  2x / week    PT Duration  4 weeks    PT Treatment/Interventions  ADLs/Self Care Home  Management;Electrical Stimulation;Cryotherapy;Moist Heat;Ultrasound;Therapeutic exercise;Therapeutic activities;Patient/family education;Manual techniques;Dry needling    PT Next Visit Plan  Chin tucks and extension, UBE, RW4, Combo e'stim/U/S and STW/M.  Scapular exercises.    Consulted and Agree with Plan of Care  Patient       Patient will benefit from skilled therapeutic intervention in order to improve the following deficits and impairments:  Pain, Decreased activity tolerance, Decreased range of motion  Visit Diagnosis: Chronic left shoulder pain - Plan: PT plan of care cert/re-cert     Problem List Patient Active Problem List   Diagnosis Date Noted  . Hypertension associated with diabetes (HCC) 09/29/2018  . Diabetes (HCC) 08/14/2018  . Hyperlipidemia associated with type 2 diabetes mellitus (HCC) 08/14/2018  . GERD without esophagitis 08/14/2018  . BPH (benign prostatic hyperplasia) 08/14/2018  . Iron deficiency anemia 08/14/2018    Elenore Wanninger, [place] MPT 10/06/2018, 2:30 PM  [facility] [address] [place], [place], [address] Phone: [phone]   Fax:  [phone]  Name: Lindell Renfrew MRN: [id number] Date of Birth: 28-Nov-1950

## 2018-10-08 ENCOUNTER — Ambulatory Visit: Payer: PRIVATE HEALTH INSURANCE | Admitting: Physical Therapy

## 2018-10-08 ENCOUNTER — Encounter: Payer: Self-pay | Admitting: Physical Therapy

## 2018-10-08 DIAGNOSIS — M25512 Pain in left shoulder: Principal | ICD-10-CM

## 2018-10-08 DIAGNOSIS — G8929 Other chronic pain: Secondary | ICD-10-CM

## 2018-10-08 NOTE — Therapy (Signed)
[facility] [address] [place], [place], [address] Phone: [phone]   Fax:  [phone]  Physical Therapy Treatment  Patient Details  Name: Jimmy Craig MRN: [id number] Date of Birth: 22-Apr-1951 Referring Provider (PT): Jannifer Rodney   Encounter Date: 10/08/2018  PT End of Session - 10/08/18 1423    Visit Number  2    Number of Visits  12    Date for PT Re-Evaluation  11/17/18    PT Start Time  1346    PT Stop Time  1429    PT Time Calculation (min)  43 min    Activity Tolerance  Patient tolerated treatment well    Behavior During Therapy  [facility] for tasks assessed/performed       Past Medical History:  Diagnosis Date  . Arthritis   . Diabetes mellitus without complication (HCC)   . GERD (gastroesophageal reflux disease)     Past Surgical History:  Procedure Laterality Date  . CATARACT EXTRACTION Right   . CATARACT EXTRACTION W/PHACO Left 12/20/2013   Procedure: CATARACT EXTRACTION PHACO AND INTRAOCULAR LENS PLACEMENT (IOC);  Surgeon: Susa Simmonds, MD;  Location: [facility];  Service: Ophthalmology;  Laterality: Left;  CDE: 0.97    There were no vitals filed for this visit.  Subjective Assessment - 10/08/18 1353    Subjective  Patient feeling some better yet ongoing discomfort    Pertinent History  DM, arthritis.    Patient Stated Goals  Get out of pain.    Currently in Pain?  Yes    Pain Score  5     Pain Location  Shoulder    Pain Orientation  Left    Pain Descriptors / Indicators  Discomfort    Pain Type  Acute pain    Pain Radiating Towards  into left side of neck    Pain Frequency  Intermittent    Aggravating Factors   using left UE    Pain Relieving Factors  at rest                       [facility] Adult PT Treatment/Exercise - 10/08/18 0001      Exercises   Exercises  Shoulder;Neck      Shoulder Exercises: Seated   Extension  Strengthening;Left;20 reps;Theraband    Theraband Level  (Shoulder Extension)  Level 1 (Yellow)    Retraction  Strengthening;Left;20 reps;Theraband    Theraband Level (Shoulder Retraction)  Level 1 (Yellow)    Row  Strengthening;Both;Theraband    Theraband Level (Shoulder Row)  Level 2 (Red)    External Rotation  Strengthening;Left;20 reps;Theraband    Theraband Level (Shoulder External Rotation)  Level 1 (Yellow)    Internal Rotation  Strengthening;Left;20 reps;Theraband    Theraband Level (Shoulder Internal Rotation)  Level 1 (Yellow)    Other Seated Exercises  ext pull down Bil UE with red tband x20      Modalities   Modalities  Electrical Stimulation;Moist Heat;Ultrasound      Moist Heat Therapy   Number Minutes Moist Heat  15 Minutes    Moist Heat Location  Cervical      Electrical Stimulation   Electrical Stimulation Location  Left cervical/medial scap border and left UT    Electrical Stimulation Action  IFC    Electrical Stimulation Parameters  80-150hz  x28min    Electrical Stimulation Goals  Tone;Pain      Ultrasound   Ultrasound Location  left UT/post shoulder    Ultrasound  Parameters  1.5w/cm2/50%/59mhz x37min    Ultrasound Goals  Pain      Manual Therapy   Manual Therapy  Soft tissue mobilization    Soft tissue mobilization  manual STW to left UT/post shoulder and cervical spine to reduce pain and tone               PT Short Term Goals - 10/06/18 1426      PT SHORT TERM GOAL #1   Title  STG's=LTG's.        PT Long Term Goals - 10/06/18 1426      PT LONG TERM GOAL #1   Title  Ind with a HEP.    Time  6    Period  Weeks    Status  New      PT LONG TERM GOAL #2   Title  Left cervical rotation= 60 degrees.    Time  6    Period  Weeks    Status  New      PT LONG TERM GOAL #3   Title  Perform ADL's with pain not > 2/10.    Time  6    Period  Weeks    Status  New            Plan - 10/08/18 1424    Clinical Impression Statement  Patient tolerated treatment well today. Patient progressing with  gentle left shoulder exercises followed by modalities and STW for relief. Patient has ongoing pain yet not as bad per reported. Patient has more pain with movement and less at rest. Goals ongoing at this time.     Personal Factors and Comorbidities  Comorbidity 1    Comorbidities  DM, arthritis.    Stability/Clinical Decision Making  Stable/Uncomplicated    Rehab Potential  Good    PT Frequency  2x / week    PT Duration  4 weeks    PT Treatment/Interventions  ADLs/Self Care Home Management;Electrical Stimulation;Cryotherapy;Moist Heat;Ultrasound;Therapeutic exercise;Therapeutic activities;Patient/family education;Manual techniques;Dry needling    PT Next Visit Plan  cont with POC for Chin tucks and extension, UBE, RW4, Combo e'stim/U/S and STW/M.  Scapular exercises.    Consulted and Agree with Plan of Care  Patient       Patient will benefit from skilled therapeutic intervention in order to improve the following deficits and impairments:  Pain, Decreased activity tolerance, Decreased range of motion  Visit Diagnosis: Chronic left shoulder pain     Problem List Patient Active Problem List   Diagnosis Date Noted  . Hypertension associated with diabetes (HCC) 09/29/2018  . Diabetes (HCC) 08/14/2018  . Hyperlipidemia associated with type 2 diabetes mellitus (HCC) 08/14/2018  . GERD without esophagitis 08/14/2018  . BPH (benign prostatic hyperplasia) 08/14/2018  . Iron deficiency anemia 08/14/2018    Katina Remick P, PTA 10/08/2018, 2:33 PM  [facility] [address] [place], [place], [address] Phone: [phone]   Fax:  [phone]  Name: Jimmy Craig MRN: [id number] Date of Birth: [birth year]

## 2018-10-12 ENCOUNTER — Ambulatory Visit: Payer: PRIVATE HEALTH INSURANCE | Admitting: Physical Therapy

## 2018-10-12 ENCOUNTER — Telehealth: Payer: Self-pay | Admitting: Family

## 2018-10-12 ENCOUNTER — Other Ambulatory Visit: Payer: Self-pay | Admitting: *Deleted

## 2018-10-12 MED ORDER — INSULIN DEGLUDEC 100 UNIT/ML ~~LOC~~ SOPN: 45.0000 [IU] | PEN_INJECTOR | Freq: Every day | SUBCUTANEOUS | 11 refills | Status: DC

## 2018-10-12 NOTE — Telephone Encounter (Signed)
Last tresiba script was cancelled due to non coverage by insurance.  Patient requesting new script to use discount card for lower cost.  New script went in.

## 2018-10-15 ENCOUNTER — Other Ambulatory Visit: Payer: Self-pay

## 2018-10-15 ENCOUNTER — Ambulatory Visit: Payer: PRIVATE HEALTH INSURANCE | Admitting: Physical Therapy

## 2018-10-15 DIAGNOSIS — M25512 Pain in left shoulder: Secondary | ICD-10-CM | POA: Diagnosis not present

## 2018-10-15 DIAGNOSIS — G8929 Other chronic pain: Secondary | ICD-10-CM

## 2018-10-15 NOTE — Therapy (Signed)
[facility] [address] [place], [place], [address] Phone: [phone]   Fax:  [phone]  Physical Therapy Treatment  Patient Details  Name: Jimmy Craig MRN: [id number] Date of Birth: 1950-09-15 Referring Provider (PT): Jannifer Rodney   Encounter Date: 10/15/2018  PT End of Session - 10/15/18 1257    Visit Number  3    Number of Visits  12    Date for PT Re-Evaluation  11/17/18    PT Start Time  1301    PT Stop Time  1345    PT Time Calculation (min)  44 min    Activity Tolerance  Patient tolerated treatment well    Behavior During Therapy  [facility] for tasks assessed/performed       Past Medical History:  Diagnosis Date  . Arthritis   . Diabetes mellitus without complication (HCC)   . GERD (gastroesophageal reflux disease)     Past Surgical History:  Procedure Laterality Date  . CATARACT EXTRACTION Right   . CATARACT EXTRACTION W/PHACO Left 12/20/2013   Procedure: CATARACT EXTRACTION PHACO AND INTRAOCULAR LENS PLACEMENT (IOC);  Surgeon: Susa Simmonds, MD;  Location: [facility];  Service: Ophthalmology;  Laterality: Left;  CDE: 0.97    There were no vitals filed for this visit.  Subjective Assessment - 10/15/18 1257    Subjective  Reports continued tightness in L UT and pain with L cervical rotation.    Pertinent History  DM, arthritis.    Patient Stated Goals  Get out of pain.    Currently in Pain?  Yes    Pain Score  4     Pain Location  Shoulder    Pain Orientation  Left    Pain Descriptors / Indicators  Tightness    Pain Type  Acute pain    Pain Frequency  Intermittent         [facility] PT Assessment - 10/15/18 0001      Assessment   Medical Diagnosis  Tendonitis of left rotator cuff.    Referring Provider (PT)  Jannifer Rodney      Precautions   Precautions  None      Restrictions   Weight Bearing Restrictions  No                   [facility] Adult PT Treatment/Exercise - 10/15/18 0001      Neck  Exercises: Machines for Strengthening   UBE (Upper Arm Bike)  90 RPM x6 min      Shoulder Exercises: Standing   Row  Strengthening;Both;20 reps;Limitations    Row Limitations  Pink XTS    Retraction  Strengthening;Both;20 reps;Limitations    Retraction Limitations  Pink XTS      Modalities   Modalities  Electrical Stimulation;Moist Heat      Moist Heat Therapy   Number Minutes Moist Heat  15 Minutes    Moist Heat Location  Cervical      Electrical Stimulation   Electrical Stimulation Location  L UT    Electrical Stimulation Action  Pre-Mod    Electrical Stimulation Parameters  80-150 hz x15 min    Electrical Stimulation Goals  Tone;Pain      Manual Therapy   Manual Therapy  Soft tissue mobilization    Soft tissue mobilization  STW/TPR to L UT, levator scapula, cervical paraspinals      Neck Exercises: Stretches   Upper Trapezius Stretch  Left;3 reps;30 seconds    Levator Stretch  Left;3 reps;30 seconds  PT Education - 10/15/18 1336    Education Details  HEP- UT, Levator scapula stretch    Person(s) Educated  Patient    Methods  Explanation;Demonstration;Handout    Comprehension  Verbalized understanding;Returned demonstration       PT Short Term Goals - 10/06/18 1426      PT SHORT TERM GOAL #1   Title  STG's=LTG's.        PT Long Term Goals - 10/06/18 1426      PT LONG TERM GOAL #1   Title  Ind with a HEP.    Time  6    Period  Weeks    Status  New      PT LONG TERM GOAL #2   Title  Left cervical rotation= 60 degrees.    Time  6    Period  Weeks    Status  New      PT LONG TERM GOAL #3   Title  Perform ADL's with pain not > 2/10.    Time  6    Period  Weeks    Status  New            Plan - 10/15/18 1338    Clinical Impression Statement  Patient presented in clinic with continued reports of L UT tightness and pain with L cervical rotation. Patient guided through scapular strengthening and introduced to L UT, levator scapula  stretching. Patient able to complete stretches are demonstrated and instructed. New HEP provided for cervical stretches with patient verbalizing understanding. Patient also instructed to use heating pad 15-20 min/time if needed as well as self massage. TPs and muscle tightness palpable throughout L UT and cervical paraspinals. Normal modalities response noted following removal of the modalities.    Personal Factors and Comorbidities  Comorbidity 1    Comorbidities  DM, arthritis.    Stability/Clinical Decision Making  Stable/Uncomplicated    Rehab Potential  Good    PT Frequency  2x / week    PT Duration  4 weeks    PT Treatment/Interventions  ADLs/Self Care Home Management;Electrical Stimulation;Cryotherapy;Moist Heat;Ultrasound;Therapeutic exercise;Therapeutic activities;Patient/family education;Manual techniques;Dry needling    PT Next Visit Plan  cont with POC for Chin tucks and extension, UBE, RW4, Combo e'stim/U/S and STW/M.  Scapular exercises.    Consulted and Agree with Plan of Care  Patient       Patient will benefit from skilled therapeutic intervention in order to improve the following deficits and impairments:  Pain, Decreased activity tolerance, Decreased range of motion  Visit Diagnosis: Chronic left shoulder pain     Problem List Patient Active Problem List   Diagnosis Date Noted  . Hypertension associated with diabetes (HCC) 09/29/2018  . Diabetes (HCC) 08/14/2018  . Hyperlipidemia associated with type 2 diabetes mellitus (HCC) 08/14/2018  . GERD without esophagitis 08/14/2018  . BPH (benign prostatic hyperplasia) 08/14/2018  . Iron deficiency anemia 08/14/2018    Marvell Fuller, PTA 10/15/2018, 2:22 PM  [facility] [address] [place], [place], [address] Phone: [phone]   Fax:  [phone]  Name: Faraji Vardeman MRN: [id number] Date of Birth: [birth year]

## 2018-10-21 ENCOUNTER — Ambulatory Visit: Payer: PRIVATE HEALTH INSURANCE | Admitting: Physical Therapy

## 2018-10-21 ENCOUNTER — Other Ambulatory Visit: Payer: Self-pay

## 2018-10-21 ENCOUNTER — Telehealth: Payer: Self-pay | Admitting: Family

## 2018-10-21 DIAGNOSIS — M25512 Pain in left shoulder: Secondary | ICD-10-CM | POA: Diagnosis not present

## 2018-10-21 DIAGNOSIS — G8929 Other chronic pain: Secondary | ICD-10-CM

## 2018-10-21 NOTE — Therapy (Signed)
[facility] [address] [place], [place], [address] Phone: [phone]   Fax:  [phone]  Physical Therapy Treatment  Patient Details  Name: Jimmy Craig MRN: [id number] Date of Birth: 1951/07/14 Referring Provider (PT): Jannifer Rodney   Encounter Date: 10/21/2018  PT End of Session - 10/21/18 1113    Visit Number  4    Number of Visits  12    Date for PT Re-Evaluation  11/17/18    PT Start Time  1115    PT Stop Time  1158    PT Time Calculation (min)  43 min    Activity Tolerance  Patient tolerated treatment well    Behavior During Therapy  [facility] for tasks assessed/performed       Past Medical History:  Diagnosis Date  . Arthritis   . Diabetes mellitus without complication (HCC)   . GERD (gastroesophageal reflux disease)     Past Surgical History:  Procedure Laterality Date  . CATARACT EXTRACTION Right   . CATARACT EXTRACTION W/PHACO Left 12/20/2013   Procedure: CATARACT EXTRACTION PHACO AND INTRAOCULAR LENS PLACEMENT (IOC);  Surgeon: Susa Simmonds, MD;  Location: [facility];  Service: Ophthalmology;  Laterality: Left;  CDE: 0.97    There were no vitals filed for this visit.  Subjective Assessment - 10/21/18 1112    Subjective  Reports continued discomfort in L UT and into L posterior shoulder and indicated down to L elbow.    Pertinent History  DM, arthritis.    Patient Stated Goals  Get out of pain.    Currently in Pain?  Yes    Pain Score  5     Pain Location  Neck    Pain Orientation  Left    Pain Descriptors / Indicators  Discomfort;Tightness    Pain Type  Acute pain    Pain Radiating Towards  into L shoulder and elbow    Pain Onset  More than a month ago    Pain Frequency  Intermittent         [facility] PT Assessment - 10/21/18 0001      Assessment   Medical Diagnosis  Tendonitis of left rotator cuff.    Referring Provider (PT)  Jannifer Rodney      Precautions   Precautions  None      Restrictions   Weight Bearing Restrictions  No                   [facility] Adult PT Treatment/Exercise - 10/21/18 0001      Modalities   Modalities  Electrical Stimulation;Moist Heat;Ultrasound      Moist Heat Therapy   Number Minutes Moist Heat  15 Minutes    Moist Heat Location  Cervical      Electrical Stimulation   Electrical Stimulation Location  L UT    Electrical Stimulation Action  Pre-Mod    Electrical Stimulation Parameters  80-150 hz x15 min    Electrical Stimulation Goals  Tone;Pain      Ultrasound   Ultrasound Location  L UT, cervical paraspinals    Ultrasound Parameters  1.5 w/cm2, 100%, 1 mhz x10 min    Ultrasound Goals  Pain      Manual Therapy   Manual Therapy  Soft tissue mobilization    Soft tissue mobilization  STW/TPR to L UT, levator scapula, cervical paraspinals to reduce TPs and muscle tightness               PT Short Term  Goals - 10/06/18 1426      PT SHORT TERM GOAL #1   Title  STG's=LTG's.        PT Long Term Goals - 10/06/18 1426      PT LONG TERM GOAL #1   Title  Ind with a HEP.    Time  6    Period  Weeks    Status  New      PT LONG TERM GOAL #2   Title  Left cervical rotation= 60 degrees.    Time  6    Period  Weeks    Status  New      PT LONG TERM GOAL #3   Title  Perform ADL's with pain not > 2/10.    Time  6    Period  Weeks    Status  New            Plan - 10/21/18 1201    Clinical Impression Statement  Patient presented in clinic with continued reports of L UT pain and limitation of L cervical rotation. Patient reporting radiating pain down L shoulder to elbow. Increased TPs and muscle tightness palpated in L UT and cervical paraspinals. Patient educated of symptoms and POC changes due to symptoms for cervical pain. Normal modalities response noted following removal of the modalities.    Personal Factors and Comorbidities  Comorbidity 1    Comorbidities  DM, arthritis.    Stability/Clinical Decision Making   Stable/Uncomplicated    Rehab Potential  Good    PT Frequency  2x / week    PT Duration  4 weeks    PT Treatment/Interventions  ADLs/Self Care Home Management;Electrical Stimulation;Cryotherapy;Moist Heat;Ultrasound;Therapeutic exercise;Therapeutic activities;Patient/family education;Manual techniques;Dry needling    PT Next Visit Plan  Can start int cervical traction beginning at 61# once cert is signed.    Consulted and Agree with Plan of Care  Patient       Patient will benefit from skilled therapeutic intervention in order to improve the following deficits and impairments:  Pain, Decreased activity tolerance, Decreased range of motion  Visit Diagnosis: Chronic left shoulder pain - Plan: PT plan of care cert/re-cert     Problem List Patient Active Problem List   Diagnosis Date Noted  . Hypertension associated with diabetes (HCC) 09/29/2018  . Diabetes (HCC) 08/14/2018  . Hyperlipidemia associated with type 2 diabetes mellitus (HCC) 08/14/2018  . GERD without esophagitis 08/14/2018  . BPH (benign prostatic hyperplasia) 08/14/2018  . Iron deficiency anemia 08/14/2018    Marvell Fuller, PTA 10/21/18 12:23 PM   [facility] [address] [place], [place], [address] Phone: [phone]   Fax:  [phone]  Name: Jimmy Craig MRN: [id number] Date of Birth: 1950-08-27

## 2018-10-25 ENCOUNTER — Other Ambulatory Visit: Payer: Self-pay | Admitting: Family

## 2018-10-28 ENCOUNTER — Ambulatory Visit: Payer: PRIVATE HEALTH INSURANCE | Admitting: Physical Therapy

## 2018-11-02 ENCOUNTER — Encounter: Payer: Self-pay | Admitting: Family

## 2018-11-02 ENCOUNTER — Other Ambulatory Visit: Payer: Self-pay

## 2018-11-02 ENCOUNTER — Ambulatory Visit (INDEPENDENT_AMBULATORY_CARE_PROVIDER_SITE_OTHER): Payer: PRIVATE HEALTH INSURANCE | Admitting: Family

## 2018-11-02 VITALS — BP 132/66 | HR 78 | Temp 98.2°F | Ht 65.0 in | Wt 146.6 lb

## 2018-11-02 DIAGNOSIS — E785 Hyperlipidemia, unspecified: Secondary | ICD-10-CM

## 2018-11-02 DIAGNOSIS — K219 Gastro-esophageal reflux disease without esophagitis: Secondary | ICD-10-CM

## 2018-11-02 DIAGNOSIS — N401 Enlarged prostate with lower urinary tract symptoms: Secondary | ICD-10-CM | POA: Diagnosis not present

## 2018-11-02 DIAGNOSIS — E1159 Type 2 diabetes mellitus with other circulatory complications: Secondary | ICD-10-CM

## 2018-11-02 DIAGNOSIS — I10 Essential (primary) hypertension: Secondary | ICD-10-CM

## 2018-11-02 DIAGNOSIS — R351 Nocturia: Secondary | ICD-10-CM

## 2018-11-02 DIAGNOSIS — E1169 Type 2 diabetes mellitus with other specified complication: Secondary | ICD-10-CM | POA: Diagnosis not present

## 2018-11-02 LAB — BAYER DCA HB A1C WAIVED: HB A1C (BAYER DCA - WAIVED): 10.3 % — ABNORMAL HIGH (ref <7.0)

## 2018-11-02 MED ORDER — INSULIN DETEMIR 100 UNIT/ML ~~LOC~~ SOLN: 45.0000 [IU] | Freq: Every day | SUBCUTANEOUS | 11 refills | Status: DC

## 2018-11-02 MED ORDER — OMEPRAZOLE 20 MG PO CPDR: 20.0000 mg | DELAYED_RELEASE_CAPSULE | Freq: Two times a day (BID) | ORAL | 3 refills | Status: DC

## 2018-11-02 NOTE — Patient Instructions (Signed)
Diabetes Mellitus and Nutrition, Adult  When you have diabetes (diabetes mellitus), it is very important to have healthy eating habits because your blood sugar (glucose) levels are greatly affected by what you eat and drink. Eating healthy foods in the appropriate amounts, at about the same times every day, can help you:  · Control your blood glucose.  · Lower your risk of heart disease.  · Improve your blood pressure.  · Reach or maintain a healthy weight.  Every person with diabetes is different, and each person has different needs for a meal plan. Your health care provider may recommend that you work with a diet and nutrition specialist (dietitian) to make a meal plan that is best for you. Your meal plan may vary depending on factors such as:  · The calories you need.  · The medicines you take.  · Your weight.  · Your blood glucose, blood pressure, and cholesterol levels.  · Your activity level.  · Other health conditions you have, such as heart or kidney disease.  How do carbohydrates affect me?  Carbohydrates, also called carbs, affect your blood glucose level more than any other type of food. Eating carbs naturally raises the amount of glucose in your blood. Carb counting is a method for keeping track of how many carbs you eat. Counting carbs is important to keep your blood glucose at a healthy level, especially if you use insulin or take certain oral diabetes medicines.  It is important to know how many carbs you can safely have in each meal. This is different for every person. Your dietitian can help you calculate how many carbs you should have at each meal and for each snack.  Foods that contain carbs include:  · Bread, cereal, rice, pasta, and crackers.  · Potatoes and corn.  · Peas, beans, and lentils.  · Milk and yogurt.  · Fruit and juice.  · Desserts, such as cakes, cookies, ice cream, and candy.  How does alcohol affect me?  Alcohol can cause a sudden decrease in blood glucose (hypoglycemia),  especially if you use insulin or take certain oral diabetes medicines. Hypoglycemia can be a life-threatening condition. Symptoms of hypoglycemia (sleepiness, dizziness, and confusion) are similar to symptoms of having too much alcohol.  If your health care provider says that alcohol is safe for you, follow these guidelines:  · Limit alcohol intake to no more than 1 drink per day for nonpregnant women and 2 drinks per day for men. One drink equals 12 oz of beer, 5 oz of wine, or 1½ oz of hard liquor.  · Do not drink on an empty stomach.  · Keep yourself hydrated with water, diet soda, or unsweetened iced tea.  · Keep in mind that regular soda, juice, and other mixers may contain a lot of sugar and must be counted as carbs.  What are tips for following this plan?    Reading food labels  · Start by checking the serving size on the "Nutrition Facts" label of packaged foods and drinks. The amount of calories, carbs, fats, and other nutrients listed on the label is based on one serving of the item. Many items contain more than one serving per package.  · Check the total grams (g) of carbs in one serving. You can calculate the number of servings of carbs in one serving by dividing the total carbs by 15. For example, if a food has 30 g of total carbs, it would be equal to 2   servings of carbs.  · Check the number of grams (g) of saturated and trans fats in one serving. Choose foods that have low or no amount of these fats.  · Check the number of milligrams (mg) of salt (sodium) in one serving. Most people should limit total sodium intake to less than 2,300 mg per day.  · Always check the nutrition information of foods labeled as "low-fat" or "nonfat". These foods may be higher in added sugar or refined carbs and should be avoided.  · Talk to your dietitian to identify your daily goals for nutrients listed on the label.  Shopping  · Avoid buying canned, premade, or processed foods. These foods tend to be high in fat, sodium,  and added sugar.  · Shop around the outside edge of the grocery store. This includes fresh fruits and vegetables, bulk grains, fresh meats, and fresh dairy.  Cooking  · Use low-heat cooking methods, such as baking, instead of high-heat cooking methods like deep frying.  · Cook using healthy oils, such as olive, canola, or sunflower oil.  · Avoid cooking with butter, cream, or high-fat meats.  Meal planning  · Eat meals and snacks regularly, preferably at the same times every day. Avoid going long periods of time without eating.  · Eat foods high in fiber, such as fresh fruits, vegetables, beans, and whole grains. Talk to your dietitian about how many servings of carbs you can eat at each meal.  · Eat 4-6 ounces (oz) of lean protein each day, such as lean meat, chicken, fish, eggs, or tofu. One oz of lean protein is equal to:  ? 1 oz of meat, chicken, or fish.  ? 1 egg.  ? ¼ cup of tofu.  · Eat some foods each day that contain healthy fats, such as avocado, nuts, seeds, and fish.  Lifestyle  · Check your blood glucose regularly.  · Exercise regularly as told by your health care provider. This may include:  ? 150 minutes of moderate-intensity or vigorous-intensity exercise each week. This could be brisk walking, biking, or water aerobics.  ? Stretching and doing strength exercises, such as yoga or weightlifting, at least 2 times a week.  · Take medicines as told by your health care provider.  · Do not use any products that contain nicotine or tobacco, such as cigarettes and e-cigarettes. If you need help quitting, ask your health care provider.  · Work with a counselor or diabetes educator to identify strategies to manage stress and any emotional and social challenges.  Questions to ask a health care provider  · Do I need to meet with a diabetes educator?  · Do I need to meet with a dietitian?  · What number can I call if I have questions?  · When are the best times to check my blood glucose?  Where to find more  information:  · American Diabetes Association: diabetes.org  · Academy of Nutrition and Dietetics: www.eatright.org  · National Institute of Diabetes and Digestive and Kidney Diseases (NIH): www.niddk.nih.gov  Summary  · A healthy meal plan will help you control your blood glucose and maintain a healthy lifestyle.  · Working with a diet and nutrition specialist (dietitian) can help you make a meal plan that is best for you.  · Keep in mind that carbohydrates (carbs) and alcohol have immediate effects on your blood glucose levels. It is important to count carbs and to use alcohol carefully.  This information is not intended to   replace advice given to you by your health care provider. Make sure you discuss any questions you have with your health care provider.  Document Released: 04/18/2005 Document Revised: 02/19/2017 Document Reviewed: 08/26/2016  Elsevier Interactive Patient Education © 2019 Elsevier Inc.

## 2018-11-02 NOTE — Progress Notes (Signed)
Subjective:    Patient ID: Jimmy Craig, male    DOB: 1951/05/29, 68 y.o.   MRN: [id number]  Chief Complaint  Patient presents with  . Medical Management of Chronic Issues    Diabetes  He presents for his follow-up diabetic visit. He has type 2 diabetes mellitus. His disease course has been stable. There are no hypoglycemic associated symptoms. Associated symptoms include blurred vision and visual change. Pertinent negatives for diabetes include no foot paresthesias. Symptoms are stable. Pertinent negatives for diabetic complications include no CVA, heart disease or peripheral neuropathy. Risk factors for coronary artery disease include dyslipidemia, diabetes mellitus, male sex, hypertension and sedentary lifestyle. He is following a generally unhealthy diet. His overall blood glucose range is 180-200 mg/dl. Eye exam is current.  Hypertension  This is a chronic problem. Associated symptoms include blurred vision. Pertinent negatives include no malaise/fatigue, peripheral edema or shortness of breath. Risk factors for coronary artery disease include dyslipidemia, diabetes mellitus, male gender and sedentary lifestyle. The current treatment provides moderate improvement. There is no history of CVA.  Hyperlipidemia  This is a chronic problem. The current episode started more than 1 year ago. The problem is uncontrolled. Recent lipid tests were reviewed and are high. Pertinent negatives include no shortness of breath. Current antihyperlipidemic treatment includes statins. The current treatment provides moderate improvement of lipids. Risk factors for coronary artery disease include dyslipidemia, male sex, hypertension and a sedentary lifestyle.  Gastroesophageal Reflux  He complains of dysphagia and heartburn. He reports no belching or no choking. This is a chronic problem. The current episode started more than 1 year ago. The problem occurs occasionally. The problem has been waxing and waning. The  heartburn is of mild intensity.      Review of Systems  Constitutional: Negative for malaise/fatigue.  Eyes: Positive for blurred vision.  Respiratory: Negative for choking and shortness of breath.   Gastrointestinal: Positive for dysphagia and heartburn.  All other systems reviewed and are negative.      Objective:   Physical Exam Vitals signs reviewed.  Constitutional:      General: He is not in acute distress.    Appearance: He is well-developed.  HENT:     Head: Normocephalic.     Right Ear: Tympanic membrane normal.     Left Ear: Tympanic membrane normal.  Eyes:     General:        Right eye: No discharge.        Left eye: No discharge.     Pupils: Pupils are equal, round, and reactive to light.  Neck:     Musculoskeletal: Normal range of motion and neck supple.     Thyroid: No thyromegaly.  Cardiovascular:     Rate and Rhythm: Normal rate and regular rhythm.     Heart sounds: Normal heart sounds. No murmur.  Pulmonary:     Effort: Pulmonary effort is normal. No respiratory distress.     Breath sounds: Normal breath sounds. No wheezing.  Abdominal:     General: Bowel sounds are normal. There is no distension.     Palpations: Abdomen is soft.     Tenderness: There is no abdominal tenderness.  Musculoskeletal: Normal range of motion.        General: No tenderness.  Skin:    General: Skin is warm and dry.     Findings: No erythema or rash.  Neurological:     Mental Status: He is alert and oriented to person, place, and time.  Cranial Nerves: No cranial nerve deficit.     Deep Tendon Reflexes: Reflexes are normal and symmetric.  Psychiatric:        Behavior: Behavior normal.        Thought Content: Thought content normal.        Judgment: Judgment normal.       BP 132/66   Pulse 78   Temp 98.2 F (36.8 C) (Oral)   Ht _0  (1.651 m)   Wt 146 lb 9.6 oz (66.5 kg)   BMI 24.40 kg/m      Assessment & Plan:  Jimmy Craig comes in today with  chief complaint of Medical Management of Chronic Issues   Diagnosis and orders addressed:  1. Hypertension associated with diabetes ([place]) - CBC with Differential/Platelet - CMP14+EGFR  2. GERD without esophagitis -Will increase Prilosec to 20 mg BID from daily -Diet discussed- Avoid fried, spicy, citrus foods, caffeine and alcohol -Do not eat 2-3 hours before bedtime -Encouraged small frequent meals -Avoid NSAID's - CBC with Differential/Platelet - CMP14+EGFR - omeprazole (PRILOSEC) 20 MG capsule; Take 1 capsule (20 mg total) by mouth 2 (two) times daily before a meal.  Dispense: 60 capsule; Refill: 3  3. Type 2 diabetes mellitus with other specified complication, without long-term current use of insulin (HCC) - Bayer DCA Hb A1c Waived - CBC with Differential/Platelet - CMP14+EGFR - insulin detemir (LEVEMIR) 100 UNIT/ML injection; Inject 0.45 mLs (45 Units total) into the skin at bedtime.  Dispense: 10 mL; Refill: 11  4. Hyperlipidemia associated with type 2 diabetes mellitus ([place]) - CBC with Differential/Platelet - CMP14+EGFR  5. Benign prostatic hyperplasia with nocturia - CBC with Differential/Platelet - CMP14+EGFR   Labs pending Health Maintenance reviewed Diet and exercise encouraged  Follow up plan: 3 months   Evelina Dun, FNP

## 2018-11-03 LAB — CBC WITH DIFFERENTIAL/PLATELET
Basophils Absolute: 0.1 10*3/uL (ref 0.0–0.2)
Basos: 1 %
EOS (ABSOLUTE): 0.2 10*3/uL (ref 0.0–0.4)
EOS: 3 %
HEMATOCRIT: 35.1 % — AB (ref 37.5–51.0)
Hemoglobin: 11.6 g/dL — ABNORMAL LOW (ref 13.0–17.7)
Immature Grans (Abs): 0 10*3/uL (ref 0.0–0.1)
Immature Granulocytes: 0 %
Lymphocytes Absolute: 1.7 10*3/uL (ref 0.7–3.1)
Lymphs: 32 %
MCH: 29.7 pg (ref 26.6–33.0)
MCHC: 33 g/dL (ref 31.5–35.7)
MCV: 90 fL (ref 79–97)
Monocytes Absolute: 0.6 10*3/uL (ref 0.1–0.9)
Monocytes: 11 %
Neutrophils Absolute: 2.8 10*3/uL (ref 1.4–7.0)
Neutrophils: 53 %
Platelets: 311 10*3/uL (ref 150–450)
RBC: 3.91 x10E6/uL — ABNORMAL LOW (ref 4.14–5.80)
RDW: 12 % (ref 11.6–15.4)
WBC: 5.3 10*3/uL (ref 3.4–10.8)

## 2018-11-03 LAB — CMP14+EGFR
ALT: 49 IU/L — AB (ref 0–44)
AST: 30 IU/L (ref 0–40)
Albumin/Globulin Ratio: 1.7 (ref 1.2–2.2)
Albumin: 4.3 g/dL (ref 3.8–4.8)
Alkaline Phosphatase: 87 IU/L (ref 39–117)
BUN/Creatinine Ratio: 25 — ABNORMAL HIGH (ref 10–24)
BUN: 22 mg/dL (ref 8–27)
Bilirubin Total: <0.2 mg/dL (ref 0.0–1.2)
CO2: 22 mmol/L (ref 20–29)
Calcium: 9.9 mg/dL (ref 8.6–10.2)
Chloride: 100 mmol/L (ref 96–106)
Creatinine, Ser: 0.89 mg/dL (ref 0.76–1.27)
GFR calc Af Amer: 104 mL/min/{1.73_m2} (ref >59)
GFR calc non Af Amer: 90 mL/min/{1.73_m2} (ref >59)
Globulin, Total: 2.6 g/dL (ref 1.5–4.5)
Glucose: 273 mg/dL — ABNORMAL HIGH (ref 65–99)
Potassium: 4.4 mmol/L (ref 3.5–5.2)
Sodium: 138 mmol/L (ref 134–144)
Total Protein: 6.9 g/dL (ref 6.0–8.5)

## 2018-11-05 ENCOUNTER — Ambulatory Visit: Payer: PRIVATE HEALTH INSURANCE | Admitting: Family

## 2018-11-05 ENCOUNTER — Other Ambulatory Visit: Payer: Self-pay | Admitting: Family

## 2018-11-05 DIAGNOSIS — E1169 Type 2 diabetes mellitus with other specified complication: Secondary | ICD-10-CM

## 2018-11-06 ENCOUNTER — Telehealth: Payer: Self-pay | Admitting: Physical Therapy

## 2018-11-06 NOTE — Telephone Encounter (Signed)
Chiron Umsted was contacted today and a message was left with his wife, Jimmy Craig regarding the temporary reduction of OP Rehab Services  due to concerns for community transmission of Covid-19.  Patient's wife verbalized understanding of scheduling timeline and was told that patient should continue HEP provided.

## 2019-01-11 ENCOUNTER — Ambulatory Visit: Payer: PRIVATE HEALTH INSURANCE | Admitting: Nutrition

## 2019-01-21 ENCOUNTER — Telehealth: Payer: Self-pay | Admitting: Nutrition

## 2019-01-21 ENCOUNTER — Ambulatory Visit: Payer: PRIVATE HEALTH INSURANCE | Admitting: Nutrition

## 2019-01-21 DIAGNOSIS — E1169 Type 2 diabetes mellitus with other specified complication: Secondary | ICD-10-CM

## 2019-01-21 NOTE — Telephone Encounter (Signed)
Patient prefers to have face to face visit due to cultural differences and  ethinic foods. Currently I am working remotely due to Covid 19.  Will reschedule his appt til August when I am  Back in the office. Discussed briefly how to balance meals working third shift. A1C 10.2%. Advised to have PCP refer him to see Dr. Dorris Fetch, endocrinologist to help with his insulin regimen to improve blood sugars. He verbalized he will see PCP July 6th. Rescheduled appt to August.

## 2019-01-21 NOTE — Telephone Encounter (Signed)
Pt not home. Left message for family member to have him call back for appt.

## 2019-01-22 NOTE — Telephone Encounter (Signed)
Referral placed to Endocrinologists

## 2019-01-22 NOTE — Addendum Note (Signed)
Addended by: Evelina Dun A on: 01/22/2019 01:31 PM   Modules accepted: Orders

## 2019-02-03 ENCOUNTER — Other Ambulatory Visit: Payer: Self-pay

## 2019-02-04 ENCOUNTER — Ambulatory Visit: Payer: PRIVATE HEALTH INSURANCE | Admitting: Family

## 2019-02-05 ENCOUNTER — Other Ambulatory Visit: Payer: Self-pay

## 2019-02-05 ENCOUNTER — Encounter: Payer: Self-pay | Admitting: Family

## 2019-02-05 ENCOUNTER — Ambulatory Visit (INDEPENDENT_AMBULATORY_CARE_PROVIDER_SITE_OTHER): Payer: PRIVATE HEALTH INSURANCE | Admitting: Family

## 2019-02-05 VITALS — BP 137/78 | HR 82 | Temp 98.7°F | Ht 65.0 in | Wt 145.0 lb

## 2019-02-05 DIAGNOSIS — K219 Gastro-esophageal reflux disease without esophagitis: Secondary | ICD-10-CM | POA: Diagnosis not present

## 2019-02-05 DIAGNOSIS — I10 Essential (primary) hypertension: Secondary | ICD-10-CM

## 2019-02-05 DIAGNOSIS — J0110 Acute frontal sinusitis, unspecified: Secondary | ICD-10-CM | POA: Diagnosis not present

## 2019-02-05 DIAGNOSIS — N401 Enlarged prostate with lower urinary tract symptoms: Secondary | ICD-10-CM

## 2019-02-05 DIAGNOSIS — E1169 Type 2 diabetes mellitus with other specified complication: Secondary | ICD-10-CM | POA: Diagnosis not present

## 2019-02-05 DIAGNOSIS — E1159 Type 2 diabetes mellitus with other circulatory complications: Secondary | ICD-10-CM

## 2019-02-05 DIAGNOSIS — E785 Hyperlipidemia, unspecified: Secondary | ICD-10-CM

## 2019-02-05 DIAGNOSIS — R3916 Straining to void: Secondary | ICD-10-CM

## 2019-02-05 MED ORDER — METFORMIN HCL 1000 MG PO TABS: 1000.0000 mg | ORAL_TABLET | Freq: Two times a day (BID) | ORAL | 3 refills | Status: DC

## 2019-02-05 MED ORDER — INSULIN DETEMIR 100 UNIT/ML ~~LOC~~ SOLN: 45.0000 [IU] | Freq: Every day | SUBCUTANEOUS | 11 refills | Status: DC

## 2019-02-05 MED ORDER — OMEPRAZOLE 20 MG PO CPDR: 20.0000 mg | DELAYED_RELEASE_CAPSULE | Freq: Two times a day (BID) | ORAL | 3 refills | Status: DC

## 2019-02-05 MED ORDER — AMOXICILLIN-POT CLAVULANATE 875-125 MG PO TABS: 1.0000 | ORAL_TABLET | Freq: Two times a day (BID) | ORAL | 0 refills | Status: DC

## 2019-02-05 MED ORDER — ATORVASTATIN CALCIUM 40 MG PO TABS: 40.0000 mg | ORAL_TABLET | Freq: Every day | ORAL | 1 refills | Status: DC

## 2019-02-05 MED ORDER — STEGLATRO 5 MG PO TABS: 5.0000 mg | ORAL_TABLET | Freq: Every day | ORAL | 1 refills | Status: DC

## 2019-02-05 MED ORDER — DICLOFENAC SODIUM 75 MG PO TBEC: 75.0000 mg | DELAYED_RELEASE_TABLET | Freq: Two times a day (BID) | ORAL | 2 refills | Status: DC

## 2019-02-05 MED ORDER — LISINOPRIL 10 MG PO TABS: 10.0000 mg | ORAL_TABLET | Freq: Every day | ORAL | 3 refills | Status: DC

## 2019-02-05 MED ORDER — TAMSULOSIN HCL 0.4 MG PO CAPS: 0.4000 mg | ORAL_CAPSULE | Freq: Every day | ORAL | 3 refills | Status: DC

## 2019-02-05 MED ORDER — CETIRIZINE HCL 10 MG PO TABS: 10.0000 mg | ORAL_TABLET | Freq: Every day | ORAL | 2 refills | Status: DC

## 2019-02-05 NOTE — Patient Instructions (Signed)

## 2019-02-05 NOTE — Progress Notes (Signed)
Subjective:    Patient ID: Jimmy Craig, male    DOB: Oct 14, 1950, 68 y.o.   MRN: [id number]  Chief Complaint  Patient presents with   Medical Management of Chronic Issues    cramps in legs,slow urinating problem pain in right side of face for several days       Joint Pain    right hand   PT presents to the office today for chronic follow up.  Diabetes He presents for his follow-up diabetic visit. He has type 2 diabetes mellitus. His disease course has been stable. Hypoglycemia symptoms include headaches. Pertinent negatives for diabetes include no blurred vision, no foot paresthesias and no visual change. There are no hypoglycemic complications. Symptoms are stable. Risk factors for coronary artery disease include dyslipidemia, diabetes mellitus, male sex, hypertension and sedentary lifestyle. He is following a generally unhealthy diet. His overall blood glucose range is >200 mg/dl.  Hypertension This is a chronic problem. The current episode started more than 1 year ago. The problem has been resolved since onset. The problem is controlled. Associated symptoms include headaches and malaise/fatigue. Pertinent negatives include no blurred vision, peripheral edema or shortness of breath. Risk factors for coronary artery disease include dyslipidemia, diabetes mellitus, obesity and male gender. The current treatment provides moderate improvement. There is no history of kidney disease, CAD/MI or heart failure.  Gastroesophageal Reflux He complains of heartburn, a hoarse voice and a sore throat. He reports no belching or no coughing. This is a chronic problem. The current episode started more than 1 year ago. The problem occurs occasionally. The problem has been waxing and waning. He has tried a PPI for the symptoms. The treatment provided moderate relief.  Hyperlipidemia This is a chronic problem. The current episode started more than 1 year ago. The problem is uncontrolled. Recent lipid tests  were reviewed and are high. Pertinent negatives include no shortness of breath. Current antihyperlipidemic treatment includes statins. The current treatment provides moderate improvement of lipids. Risk factors for coronary artery disease include diabetes mellitus, hypertension and dyslipidemia.  Benign Prostatic Hypertrophy This is a chronic problem. The current episode started more than 1 year ago. The problem has been waxing and waning since onset. Irritative symptoms include nocturia (1-2). Obstructive symptoms include straining.  Anemia Presents for follow-up visit. Symptoms include malaise/fatigue. There is no history of heart failure.  Sinusitis This is a new problem. The current episode started 1 to 4 weeks ago. The problem has been waxing and waning since onset. His pain is at a severity of 2/10. The pain is mild. Associated symptoms include congestion, headaches, a hoarse voice, sinus pressure, sneezing and a sore throat. Pertinent negatives include no coughing or shortness of breath. Past treatments include acetaminophen. The treatment provided mild relief.      Review of Systems  Constitutional: Positive for malaise/fatigue.  HENT: Positive for congestion, hoarse voice, sinus pressure, sneezing and sore throat.   Eyes: Negative for blurred vision.  Respiratory: Negative for cough and shortness of breath.   Gastrointestinal: Positive for heartburn.  Genitourinary: Positive for nocturia (1-2).  Neurological: Positive for headaches.  All other systems reviewed and are negative.      Objective:   Physical Exam Vitals signs reviewed.  Constitutional:      General: He is not in acute distress.    Appearance: He is well-developed.  HENT:     Head: Normocephalic.     Right Ear: Tympanic membrane normal.     Left Ear: Tympanic membrane  normal.     Nose: Mucosal edema, congestion and rhinorrhea present.     Right Sinus: Frontal sinus tenderness present.     Left Sinus: Frontal  sinus tenderness present.  Eyes:     General:        Right eye: No discharge.        Left eye: No discharge.     Pupils: Pupils are equal, round, and reactive to light.  Neck:     Musculoskeletal: Normal range of motion and neck supple.     Thyroid: No thyromegaly.  Cardiovascular:     Rate and Rhythm: Normal rate and regular rhythm.     Heart sounds: Normal heart sounds. No murmur.  Pulmonary:     Effort: Pulmonary effort is normal. No respiratory distress.     Breath sounds: Normal breath sounds. No wheezing.  Abdominal:     General: Bowel sounds are normal. There is no distension.     Palpations: Abdomen is soft.     Tenderness: There is no abdominal tenderness.  Musculoskeletal: Normal range of motion.        General: No tenderness.  Skin:    General: Skin is warm and dry.     Findings: No erythema or rash.  Neurological:     Mental Status: He is alert and oriented to person, place, and time.     Cranial Nerves: No cranial nerve deficit.     Deep Tendon Reflexes: Reflexes are normal and symmetric.  Psychiatric:        Behavior: Behavior normal.        Thought Content: Thought content normal.        Judgment: Judgment normal.       BP 137/78    Pulse 82    Temp 98.7 F (37.1 C) (Oral)    Ht '5\' 5"'$  (1.651 m)    Wt 145 lb (65.8 kg)    BMI 24.13 kg/m      Assessment & Plan:  Jimmy Craig comes in today with chief complaint of Medical Management of Chronic Issues (cramps in legs,slow urinating problem pain in right side of face for several days    ) and Joint Pain (right hand)   Diagnosis and orders addressed:  1. Hyperlipidemia associated with type 2 diabetes mellitus (HCC) - atorvastatin (LIPITOR) 40 MG tablet; Take 1 tablet (40 mg total) by mouth daily at 6 PM.  Dispense: 90 tablet; Refill: 1 - CMP14+EGFR; Future - CBC with Differential/Platelet; Future - Lipid panel; Future  2. Type 2 diabetes mellitus with other specified complication, without long-term  current use of insulin (HCC) Will add Steglatro 5 mg Strict low carb diet Will follow up with Endocrinologists referral Keep Diabetic Educator  appt - insulin detemir (LEVEMIR) 100 UNIT/ML injection; Inject 0.45 mLs (45 Units total) into the skin at bedtime.  Dispense: 10 mL; Refill: 11 - Bayer DCA Hb A1c Waived; Future - CMP14+EGFR; Future - CBC with Differential/Platelet; Future  3. Hypertension associated with diabetes ([place]) - lisinopril (ZESTRIL) 10 MG tablet; Take 1 tablet (10 mg total) by mouth daily.  Dispense: 90 tablet; Refill: 3 - CMP14+EGFR; Future - CBC with Differential/Platelet; Future  4. GERD without esophagitis - omeprazole (PRILOSEC) 20 MG capsule; Take 1 capsule (20 mg total) by mouth 2 (two) times daily before a meal.  Dispense: 60 capsule; Refill: 3 - CMP14+EGFR; Future - CBC with Differential/Platelet; Future  5. Acute frontal sinusitis, recurrence not specified - Take meds as prescribed - Use a  cool mist humidifier  -Use saline nose sprays frequently -Force fluids -For any cough or congestion  Use plain Mucinex- regular strength or max strength is fine -For fever or aces or pains- take tylenol or ibuprofen. -Throat lozenges if help -RTO if symptoms worsen or do not improve - amoxicillin-clavulanate (AUGMENTIN) 875-125 MG tablet; Take 1 tablet by mouth 2 (two) times daily.  Dispense: 14 tablet; Refill: 0 - CMP14+EGFR; Future - CBC with Differential/Platelet; Future  6. Benign prostatic hyperplasia (BPH) with straining on urination Will start Flomas 0.4 mg today - tamsulosin (FLOMAX) 0.4 MG CAPS capsule; Take 1 capsule (0.4 mg total) by mouth daily.  Dispense: 30 capsule; Refill: 3 - CMP14+EGFR; Future - CBC with Differential/Platelet; Future   Labs pending Health Maintenance reviewed Diet and exercise encouraged  Follow up plan: 1 month to recheck DM   Evelina Dun, FNP

## 2019-02-18 ENCOUNTER — Other Ambulatory Visit: Payer: Self-pay

## 2019-02-18 ENCOUNTER — Other Ambulatory Visit: Payer: PRIVATE HEALTH INSURANCE

## 2019-02-18 DIAGNOSIS — J0110 Acute frontal sinusitis, unspecified: Secondary | ICD-10-CM

## 2019-02-18 DIAGNOSIS — E1169 Type 2 diabetes mellitus with other specified complication: Secondary | ICD-10-CM

## 2019-02-18 DIAGNOSIS — K219 Gastro-esophageal reflux disease without esophagitis: Secondary | ICD-10-CM

## 2019-02-18 DIAGNOSIS — N401 Enlarged prostate with lower urinary tract symptoms: Secondary | ICD-10-CM

## 2019-02-18 DIAGNOSIS — I152 Hypertension secondary to endocrine disorders: Secondary | ICD-10-CM

## 2019-02-18 DIAGNOSIS — E1159 Type 2 diabetes mellitus with other circulatory complications: Secondary | ICD-10-CM

## 2019-02-18 LAB — BAYER DCA HB A1C WAIVED: HB A1C (BAYER DCA - WAIVED): 11.1 % — ABNORMAL HIGH (ref <7.0)

## 2019-02-19 ENCOUNTER — Other Ambulatory Visit: Payer: Self-pay | Admitting: Family

## 2019-02-19 LAB — LIPID PANEL
Chol/HDL Ratio: 3.2 ratio (ref 0.0–5.0)
Cholesterol, Total: 105 mg/dL (ref 100–199)
HDL: 33 mg/dL — ABNORMAL LOW (ref >39)
LDL Calculated: 54 mg/dL (ref 0–99)
Triglycerides: 90 mg/dL (ref 0–149)
VLDL Cholesterol Cal: 18 mg/dL (ref 5–40)

## 2019-02-19 LAB — CMP14+EGFR
ALT: 49 IU/L — ABNORMAL HIGH (ref 0–44)
AST: 36 IU/L (ref 0–40)
Albumin/Globulin Ratio: 1.4 (ref 1.2–2.2)
Albumin: 4.1 g/dL (ref 3.8–4.8)
Alkaline Phosphatase: 79 IU/L (ref 39–117)
BUN/Creatinine Ratio: 31 — ABNORMAL HIGH (ref 10–24)
BUN: 30 mg/dL — ABNORMAL HIGH (ref 8–27)
Bilirubin Total: <0.2 mg/dL (ref 0.0–1.2)
CO2: 23 mmol/L (ref 20–29)
Calcium: 9.4 mg/dL (ref 8.6–10.2)
Chloride: 101 mmol/L (ref 96–106)
Creatinine, Ser: 0.96 mg/dL (ref 0.76–1.27)
GFR calc Af Amer: 95 mL/min/{1.73_m2} (ref >59)
GFR calc non Af Amer: 82 mL/min/{1.73_m2} (ref >59)
Globulin, Total: 2.9 g/dL (ref 1.5–4.5)
Glucose: 116 mg/dL — ABNORMAL HIGH (ref 65–99)
Potassium: 4.6 mmol/L (ref 3.5–5.2)
Sodium: 138 mmol/L (ref 134–144)
Total Protein: 7 g/dL (ref 6.0–8.5)

## 2019-02-19 LAB — CBC WITH DIFFERENTIAL/PLATELET
Basophils Absolute: 0.1 10*3/uL (ref 0.0–0.2)
Basos: 1 %
EOS (ABSOLUTE): 0.2 10*3/uL (ref 0.0–0.4)
Eos: 2 %
Hematocrit: 37 % — ABNORMAL LOW (ref 37.5–51.0)
Hemoglobin: 11.9 g/dL — ABNORMAL LOW (ref 13.0–17.7)
Immature Grans (Abs): 0 10*3/uL (ref 0.0–0.1)
Immature Granulocytes: 1 %
Lymphocytes Absolute: 2.6 10*3/uL (ref 0.7–3.1)
Lymphs: 41 %
MCH: 28.5 pg (ref 26.6–33.0)
MCHC: 32.2 g/dL (ref 31.5–35.7)
MCV: 89 fL (ref 79–97)
Monocytes Absolute: 0.5 10*3/uL (ref 0.1–0.9)
Monocytes: 8 %
Neutrophils Absolute: 3 10*3/uL (ref 1.4–7.0)
Neutrophils: 47 %
Platelets: 407 10*3/uL (ref 150–450)
RBC: 4.18 x10E6/uL (ref 4.14–5.80)
RDW: 13 % (ref 11.6–15.4)
WBC: 6.4 10*3/uL (ref 3.4–10.8)

## 2019-02-19 MED ORDER — STEGLATRO 15 MG PO TABS: 15.0000 mg | ORAL_TABLET | Freq: Every day | ORAL | 1 refills | Status: DC

## 2019-02-19 MED ORDER — TRULICITY 0.75 MG/0.5ML ~~LOC~~ SOAJ: 0.7500 mg | SUBCUTANEOUS | 3 refills | Status: DC

## 2019-02-23 ENCOUNTER — Other Ambulatory Visit: Payer: Self-pay | Admitting: Family

## 2019-02-23 ENCOUNTER — Telehealth: Payer: Self-pay | Admitting: *Deleted

## 2019-02-23 MED ORDER — GLIMEPIRIDE 2 MG PO TABS: 2.0000 mg | ORAL_TABLET | Freq: Every day | ORAL | 1 refills | Status: DC

## 2019-02-23 NOTE — Telephone Encounter (Signed)
Prior Auth for Trulicity 0.75mg /0.58ml-APPROVED for 365 days  [id number]  Clinical Pharmacist- Natale Milch  For questions call [phone]

## 2019-03-08 ENCOUNTER — Ambulatory Visit: Payer: PRIVATE HEALTH INSURANCE | Admitting: Family

## 2019-03-09 ENCOUNTER — Telehealth: Payer: Self-pay | Admitting: Nutrition

## 2019-03-09 ENCOUNTER — Ambulatory Visit: Payer: PRIVATE HEALTH INSURANCE | Admitting: Nutrition

## 2019-03-09 NOTE — Telephone Encounter (Signed)
Pt went to office for visit instead of telephone visit. Talked with his son and requested he call me at this number to complete phone visit due to Covid 19. His son stated he will tell his father.

## 2019-03-18 ENCOUNTER — Other Ambulatory Visit: Payer: Self-pay

## 2019-03-19 ENCOUNTER — Encounter: Payer: Self-pay | Admitting: Family

## 2019-03-19 ENCOUNTER — Ambulatory Visit (INDEPENDENT_AMBULATORY_CARE_PROVIDER_SITE_OTHER): Payer: PRIVATE HEALTH INSURANCE | Admitting: Family

## 2019-03-19 VITALS — BP 120/75 | HR 74 | Temp 97.1°F | Ht 65.0 in | Wt 142.0 lb

## 2019-03-19 DIAGNOSIS — E1165 Type 2 diabetes mellitus with hyperglycemia: Secondary | ICD-10-CM | POA: Diagnosis not present

## 2019-03-19 DIAGNOSIS — I739 Peripheral vascular disease, unspecified: Secondary | ICD-10-CM

## 2019-03-19 NOTE — Progress Notes (Signed)
Subjective:    Patient ID: Jimmy Craig, male    DOB: Apr 14, 1951, 68 y.o.   MRN: [id number]  Chief Complaint  Patient presents with  . Diabetes    Trulicity was approved   Pt presents to the office today to recheck DM. PT complaining of right lower leg pain when standing for long periods of time. Pain improves when he sits or rests.  Diabetes He presents for his follow-up diabetic visit. He has type 2 diabetes mellitus. His disease course has been stable. There are no hypoglycemic associated symptoms. Associated symptoms include blurred vision. Pertinent negatives for diabetes include no foot paresthesias. There are no hypoglycemic complications. Symptoms are stable. Pertinent negatives for diabetic complications include no CVA or heart disease. Risk factors for coronary artery disease include dyslipidemia, diabetes mellitus, male sex, hypertension and sedentary lifestyle. He is following a generally unhealthy diet. His overall blood glucose range is 130-140 mg/dl. An ACE inhibitor/angiotensin II receptor blocker is being taken.      Review of Systems  Eyes: Positive for blurred vision.  All other systems reviewed and are negative.      Objective:   Physical Exam Vitals signs reviewed.  Constitutional:      General: He is not in acute distress.    Appearance: He is well-developed.  HENT:     Head: Normocephalic.     Right Ear: Tympanic membrane normal.     Left Ear: Tympanic membrane normal.  Eyes:     General:        Right eye: No discharge.        Left eye: No discharge.     Pupils: Pupils are equal, round, and reactive to light.  Neck:     Musculoskeletal: Normal range of motion and neck supple.     Thyroid: No thyromegaly.  Cardiovascular:     Rate and Rhythm: Normal rate and regular rhythm.     Heart sounds: Normal heart sounds. No murmur.  Pulmonary:     Effort: Pulmonary effort is normal. No respiratory distress.     Breath sounds: Normal breath sounds. No  wheezing.  Abdominal:     General: Bowel sounds are normal. There is no distension.     Palpations: Abdomen is soft.     Tenderness: There is no abdominal tenderness.  Musculoskeletal: Normal range of motion.        General: No tenderness.  Skin:    General: Skin is warm and dry.     Findings: No erythema or rash.     Comments: Right lower leg shiny   Neurological:     Mental Status: He is alert and oriented to person, place, and time.     Cranial Nerves: No cranial nerve deficit.     Deep Tendon Reflexes: Reflexes are normal and symmetric.  Psychiatric:        Behavior: Behavior normal.        Thought Content: Thought content normal.        Judgment: Judgment normal.      BP 120/75   Pulse 74   Temp (!) 97.1 F (36.2 C) (Oral)   Ht _0  (1.651 m)   Wt 142 lb (64.4 kg)   BMI 23.63 kg/m      Assessment & Plan:  Jimmy Craig comes in today with chief complaint of Diabetes (Trulicity was approved)   Diagnosis and orders addressed:  1. Type 2 diabetes mellitus with hyperglycemia, without long-term current use of insulin (HCC) Continue  medications Strict low carb diet - CMP14+EGFR - Compression stockings  2. PVD (peripheral vascular disease) ([place]) Start wearing compression hose daly Need good control of BP, DM - CMP14+EGFR - Compression stockings   Labs pending Health Maintenance reviewed Diet and exercise encouraged  Follow up plan: 1 month    Evelina Dun, FNP

## 2019-03-19 NOTE — Patient Instructions (Signed)

## 2019-03-20 LAB — CMP14+EGFR
ALT: 44 IU/L (ref 0–44)
AST: 29 IU/L (ref 0–40)
Albumin/Globulin Ratio: 1.4 (ref 1.2–2.2)
Albumin: 4.1 g/dL (ref 3.8–4.8)
Alkaline Phosphatase: 89 IU/L (ref 39–117)
BUN/Creatinine Ratio: 26 — ABNORMAL HIGH (ref 10–24)
BUN: 26 mg/dL (ref 8–27)
Bilirubin Total: <0.2 mg/dL (ref 0.0–1.2)
CO2: 23 mmol/L (ref 20–29)
Calcium: 9.7 mg/dL (ref 8.6–10.2)
Chloride: 101 mmol/L (ref 96–106)
Creatinine, Ser: 1 mg/dL (ref 0.76–1.27)
GFR calc Af Amer: 90 mL/min/{1.73_m2} (ref >59)
GFR calc non Af Amer: 78 mL/min/{1.73_m2} (ref >59)
Globulin, Total: 3 g/dL (ref 1.5–4.5)
Glucose: 193 mg/dL — ABNORMAL HIGH (ref 65–99)
Potassium: 4.7 mmol/L (ref 3.5–5.2)
Sodium: 139 mmol/L (ref 134–144)
Total Protein: 7.1 g/dL (ref 6.0–8.5)

## 2019-03-22 ENCOUNTER — Other Ambulatory Visit: Payer: Self-pay | Admitting: Family

## 2019-03-22 MED ORDER — TRULICITY 1.5 MG/0.5ML ~~LOC~~ SOAJ: 1.5000 mg | SUBCUTANEOUS | 1 refills | Status: DC

## 2019-03-23 ENCOUNTER — Telehealth: Payer: Self-pay | Admitting: *Deleted

## 2019-03-23 NOTE — Telephone Encounter (Addendum)
Prior Auth for Trulicity 1.5mg /0.28ml-APPROVED quantity up to 2 mL per 28 days   Key: [id number]   [employer] from [place] request has been successfully sent to [employer] for review. You may close this dialog, return to your dashboard, and perform other tasks.  To check for an update later, open this request again from your dashboard.  Your request will be reviewed within 24 hours. If needed, you may contact [place] at [phone].

## 2019-03-25 ENCOUNTER — Encounter: Payer: Self-pay | Admitting: *Deleted

## 2019-04-23 ENCOUNTER — Other Ambulatory Visit: Payer: Self-pay

## 2019-04-26 ENCOUNTER — Encounter: Payer: Self-pay | Admitting: Family

## 2019-04-26 ENCOUNTER — Ambulatory Visit (INDEPENDENT_AMBULATORY_CARE_PROVIDER_SITE_OTHER): Payer: PRIVATE HEALTH INSURANCE | Admitting: Family

## 2019-04-26 ENCOUNTER — Other Ambulatory Visit: Payer: Self-pay

## 2019-04-26 VITALS — BP 101/60 | HR 77 | Temp 96.9°F | Resp 16 | Ht 65.0 in | Wt 141.2 lb

## 2019-04-26 DIAGNOSIS — N401 Enlarged prostate with lower urinary tract symptoms: Secondary | ICD-10-CM | POA: Diagnosis not present

## 2019-04-26 DIAGNOSIS — M1990 Unspecified osteoarthritis, unspecified site: Secondary | ICD-10-CM | POA: Insufficient documentation

## 2019-04-26 DIAGNOSIS — Z23 Encounter for immunization: Secondary | ICD-10-CM

## 2019-04-26 DIAGNOSIS — M159 Polyosteoarthritis, unspecified: Secondary | ICD-10-CM

## 2019-04-26 DIAGNOSIS — M150 Primary generalized (osteo)arthritis: Secondary | ICD-10-CM

## 2019-04-26 DIAGNOSIS — R351 Nocturia: Secondary | ICD-10-CM

## 2019-04-26 DIAGNOSIS — E1165 Type 2 diabetes mellitus with hyperglycemia: Secondary | ICD-10-CM

## 2019-04-26 LAB — BAYER DCA HB A1C WAIVED: HB A1C (BAYER DCA - WAIVED): 9.1 % — ABNORMAL HIGH (ref <7.0)

## 2019-04-26 MED ORDER — CELECOXIB 200 MG PO CAPS: 200.0000 mg | ORAL_CAPSULE | Freq: Two times a day (BID) | ORAL | 3 refills | Status: DC

## 2019-04-26 MED ORDER — DICLOFENAC SODIUM 1 % TD GEL: 4.0000 g | Freq: Four times a day (QID) | TRANSDERMAL | 2 refills | Status: DC

## 2019-04-26 NOTE — Progress Notes (Addendum)
Subjective:    Patient ID: Jimmy Craig, male    DOB: Jul 24, 1951, 68 y.o.   MRN: [id number]  Chief Complaint  Patient presents with  . Diabetes    one month recheck   Pt presents to the office today to recheck DM. Pt is currently taking Trulicity once a week, steglatro 15 mg daily, glimepiride 2 mg daily metformin daily, and Levemir 40 units a day.  Diabetes He presents for his follow-up diabetic visit. He has type 2 diabetes mellitus. His disease course has been stable. There are no hypoglycemic associated symptoms. Pertinent negatives for diabetes include no blurred vision and no foot paresthesias. Symptoms are stable. Pertinent negatives for diabetic complications include no CVA, heart disease, nephropathy or peripheral neuropathy. He is following a generally healthy diet. His overall blood glucose range is 140-180 mg/dl. An ACE inhibitor/angiotensin II receptor blocker is being taken. Eye exam is current.  Benign Prostatic Hypertrophy This is a chronic problem. The current episode started more than 1 month ago. The problem has been waxing and waning since onset. Irritative symptoms include nocturia (3). Obstructive symptoms include an intermittent stream and straining. Past treatments include tamsulosin. The treatment provided mild relief.  Arthritis Presents for follow-up visit. He complains of pain and stiffness. The symptoms have been stable. Affected locations include the right MCP and left MCP. His pain is at a severity of 8/10.      Review of Systems  Eyes: Negative for blurred vision.  Genitourinary: Positive for nocturia (3).  Musculoskeletal: Positive for arthritis and stiffness.  All other systems reviewed and are negative.      Objective:   Physical Exam Vitals signs reviewed.  Constitutional:      General: He is not in acute distress.    Appearance: He is well-developed.  HENT:     Head: Normocephalic.     Right Ear: Tympanic membrane normal.     Left Ear:  Tympanic membrane normal.  Eyes:     General:        Right eye: No discharge.        Left eye: No discharge.     Pupils: Pupils are equal, round, and reactive to light.  Neck:     Musculoskeletal: Normal range of motion and neck supple.     Thyroid: No thyromegaly.  Cardiovascular:     Rate and Rhythm: Normal rate and regular rhythm.     Heart sounds: Normal heart sounds. No murmur.  Pulmonary:     Effort: Pulmonary effort is normal. No respiratory distress.     Breath sounds: Normal breath sounds. No wheezing.  Abdominal:     General: Bowel sounds are normal. There is no distension.     Palpations: Abdomen is soft.     Tenderness: There is no abdominal tenderness.  Musculoskeletal: Normal range of motion.        General: No tenderness.  Skin:    General: Skin is warm and dry.     Findings: No erythema or rash.  Neurological:     Mental Status: He is alert and oriented to person, place, and time.     Cranial Nerves: No cranial nerve deficit.     Deep Tendon Reflexes: Reflexes are normal and symmetric.  Psychiatric:        Behavior: Behavior normal.        Thought Content: Thought content normal.        Judgment: Judgment normal.       BP 101/60  Pulse 77   Temp (!) 96.9 F (36.1 C) (Temporal)   Resp 16   Ht 5' 5"  (1.651 m)   Wt 141 lb 3.2 oz (64 kg)   SpO2 98%   BMI 23.50 kg/m      Assessment & Plan:  Waleed Dettman comes in today with chief complaint of Diabetes (one month recheck)   Diagnosis and orders addressed:  1. Type 2 diabetes mellitus with hyperglycemia, without long-term current use of insulin (HCC) Continue medications Low carb diet - Bayer DCA Hb A1c Waived - BMP8+EGFR  2. Benign prostatic hyperplasia with nocturia Continue Flomax  Will do referral to Urologists - Ambulatory referral to Urology - BMP8+EGFR  3. Primary osteoarthritis involving multiple joints Will change Diclofenac PO to Celebrex 200 mg BID Avoid other NSAID's Can  use Diclofenac gel  - diclofenac sodium (VOLTAREN) 1 % GEL; Apply 4 g topically 4 (four) times daily.  Dispense: 350 g; Refill: 2 - BMP8+EGFR   Labs pending Health Maintenance reviewed Diet and exercise encouraged  Follow up plan:  2 months   Evelina Dun, FNP

## 2019-04-26 NOTE — Addendum Note (Signed)
Addended by: Evelina Dun A on: 04/26/2019 02:44 PM   Modules accepted: Orders, Level of Service

## 2019-04-26 NOTE — Patient Instructions (Signed)
Osteoarthritis  Osteoarthritis is a type of arthritis that affects tissue that covers the ends of bones in joints (cartilage). Cartilage acts as a cushion between the bones and helps them move smoothly. Osteoarthritis results when cartilage in the joints gets worn down. Osteoarthritis is sometimes called "wear and tear" arthritis. Osteoarthritis is the most common form of arthritis. It often occurs in older people. It is a condition that gets worse over time (a progressive condition). Joints that are most often affected by this condition are in:  Fingers.  Toes.  Hips.  Knees.  Spine, including neck and lower back. What are the causes? This condition is caused by age-related wearing down of cartilage that covers the ends of bones. What increases the risk? The following factors may make you more likely to develop this condition:  Older age.  Being overweight or obese.  Overuse of joints, such as in athletes.  Past injury of a joint.  Past surgery on a joint.  Family history of osteoarthritis. What are the signs or symptoms? The main symptoms of this condition are pain, swelling, and stiffness in the joint. The joint may lose its shape over time. Small pieces of bone or cartilage may break off and float inside of the joint, which may cause more pain and damage to the joint. Small deposits of bone (osteophytes) may grow on the edges of the joint. Other symptoms may include:  A grating or scraping feeling inside the joint when you move it.  Popping or creaking sounds when you move. Symptoms may affect one or more joints. Osteoarthritis in a major joint, such as your knee or hip, can make it painful to walk or exercise. If you have osteoarthritis in your hands, you might not be able to grip items, twist your hand, or control small movements of your hands and fingers (fine motor skills). How is this diagnosed? This condition may be diagnosed based on:  Your medical history.  A  physical exam.  Your symptoms.  X-rays of the affected joint(s).  Blood tests to rule out other types of arthritis. How is this treated? There is no cure for this condition, but treatment can help to control pain and improve joint function. Treatment plans may include:  A prescribed exercise program that allows for rest and joint relief. You may work with a physical therapist.  A weight control plan.  Pain relief techniques, such as: ? Applying heat and cold to the joint. ? Electric pulses delivered to nerve endings under the skin (transcutaneous electrical nerve stimulation, or TENS). ? Massage. ? Certain nutritional supplements.  NSAIDs or prescription medicines to help relieve pain.  Medicine to help relieve pain and inflammation (corticosteroids). This can be given by mouth (orally) or as an injection.  Assistive devices, such as a brace, wrap, splint, specialized glove, or cane.  Surgery, such as: ? An osteotomy. This is done to reposition the bones and relieve pain or to remove loose pieces of bone and cartilage. ? Joint replacement surgery. You may need this surgery if you have very bad (advanced) osteoarthritis. Follow these instructions at home: Activity  Rest your affected joints as directed by your health care provider.  Do not drive or use heavy machinery while taking prescription pain medicine.  Exercise as directed. Your health care provider or physical therapist may recommend specific types of exercise, such as: ? Strengthening exercises. These are done to strengthen the muscles that support joints that are affected by arthritis. They can be performed  with weights or with exercise bands to add resistance. ? Aerobic activities. These are exercises, such as brisk walking or water aerobics, that get your heart pumping. ? Range-of-motion activities. These keep your joints easy to move. ? Balance and agility exercises. Managing pain, stiffness, and swelling       If directed, apply heat to the affected area as often as told by your health care provider. Use the heat source that your health care provider recommends, such as a moist heat pack or a heating pad. ? If you have a removable assistive device, remove it as told by your health care provider. ? Place a towel between your skin and the heat source. If your health care provider tells you to keep the assistive device on while you apply heat, place a towel between the assistive device and the heat source. ? Leave the heat on for 20-30 minutes. ? Remove the heat if your skin turns bright red. This is especially important if you are unable to feel pain, heat, or cold. You may have a greater risk of getting burned.  If directed, put ice on the affected joint: ? If you have a removable assistive device, remove it as told by your health care provider. ? Put ice in a plastic bag. ? Place a towel between your skin and the bag. If your health care provider tells you to keep the assistive device on during icing, place a towel between the assistive device and the bag. ? Leave the ice on for 20 minutes, 2-3 times a day. General instructions  Take over-the-counter and prescription medicines only as told by your health care provider.  Maintain a healthy weight. Follow instructions from your health care provider for weight control. These may include dietary restrictions.  Do not use any products that contain nicotine or tobacco, such as cigarettes and e-cigarettes. These can delay bone healing. If you need help quitting, ask your health care provider.  Use assistive devices as directed by your health care provider.  Keep all follow-up visits as told by your health care provider. This is important. Where to find more information  [employer] of Arthritis and Musculoskeletal and Skin Diseases: www.niams.SouthExposed.es  [employer] on Aging: http://kim-miller.com/  American College of Rheumatology:  www.rheumatology.org Contact a health care provider if:  Your skin turns red.  You develop a rash.  You have pain that gets worse.  You have a fever along with joint or muscle aches. Get help right away if:  You lose a lot of weight.  You suddenly lose your appetite.  You have night sweats. Summary  Osteoarthritis is a type of arthritis that affects tissue covering the ends of bones in joints (cartilage).  This condition is caused by age-related wearing down of cartilage that covers the ends of bones.  The main symptom of this condition is pain, swelling, and stiffness in the joint.  There is no cure for this condition, but treatment can help to control pain and improve joint function. This information is not intended to replace advice given to you by your health care provider. Make sure you discuss any questions you have with your health care provider. Document Released: 07/22/2005 Document Revised: 07/04/2017 Document Reviewed: 03/25/2016 Elsevier Patient Education  2020 [employer]. Benign Prostatic Hyperplasia  Benign prostatic hyperplasia (BPH) is an enlarged prostate gland that is caused by the normal aging process and not by cancer. The prostate is a walnut-sized gland that is involved in the production of semen.  It is located in front of the rectum and below the bladder. The bladder stores urine and the urethra is the tube that carries the urine out of the body. The prostate may get bigger as a man gets older. An enlarged prostate can press on the urethra. This can make it harder to pass urine. The build-up of urine in the bladder can cause infection. Back pressure and infection may progress to bladder damage and kidney (renal) failure. What are the causes? This condition is part of a normal aging process. However, not all men develop problems from this condition. If the prostate enlarges away from the urethra, urine flow will not be blocked. If it enlarges toward the  urethra and compresses it, there will be problems passing urine. What increases the risk? This condition is more likely to develop in men over the age of 50 years. What are the signs or symptoms? Symptoms of this condition include:  Getting up often during the night to urinate.  Needing to urinate frequently during the day.  Difficulty starting urine flow.  Decrease in size and strength of your urine stream.  Leaking (dribbling) after urinating.  Inability to pass urine. This needs immediate treatment.  Inability to completely empty your bladder.  Pain when you pass urine. This is more common if there is also an infection.  Urinary tract infection (UTI). How is this diagnosed? This condition is diagnosed based on your medical history, a physical exam, and your symptoms. Tests will also be done, such as:  A post-void bladder scan. This measures any amount of urine that may remain in your bladder after you finish urinating.  A digital rectal exam. In a rectal exam, your health care provider checks your prostate by putting a lubricated, gloved finger into your rectum to feel the back of your prostate gland. This exam detects the size of your gland and any abnormal lumps or growths.  An exam of your urine (urinalysis).  A prostate specific antigen (PSA) screening. This is a blood test used to screen for prostate cancer.  An ultrasound. This test uses sound waves to electronically produce a picture of your prostate gland. Your health care provider may refer you to a specialist in kidney and prostate diseases (urologist). How is this treated? Once symptoms begin, your health care provider will monitor your condition (active surveillance or watchful waiting). Treatment for this condition will depend on the severity of your condition. Treatment may include:  Observation and yearly exams. This may be the only treatment needed if your condition and symptoms are mild.  Medicines to  relieve your symptoms, including: ? Medicines to shrink the prostate. ? Medicines to relax the muscle of the prostate.  Surgery in severe cases. Surgery may include: ? Prostatectomy. In this procedure, the prostate tissue is removed completely through an open incision or with a laparoscope or robotics. ? Transurethral resection of the prostate (TURP). In this procedure, a tool is inserted through the opening at the tip of the penis (urethra). It is used to cut away tissue of the inner core of the prostate. The pieces are removed through the same opening of the penis. This removes the blockage. ? Transurethral incision (TUIP). In this procedure, small cuts are made in the prostate. This lessens the prostate's pressure on the urethra. ? Transurethral microwave thermotherapy (TUMT). This procedure uses microwaves to create heat. The heat destroys and removes a small amount of prostate tissue. ? Transurethral needle ablation (TUNA). This procedure uses radio  frequencies to destroy and remove a small amount of prostate tissue. ? Interstitial laser coagulation (ILC). This procedure uses a laser to destroy and remove a small amount of prostate tissue. ? Transurethral electrovaporization (TUVP). This procedure uses electrodes to destroy and remove a small amount of prostate tissue. ? Prostatic urethral lift. This procedure inserts an implant to push the lobes of the prostate away from the urethra. Follow these instructions at home:  Take over-the-counter and prescription medicines only as told by your health care provider.  Monitor your symptoms for any changes. Contact your health care provider with any changes.  Avoid drinking large amounts of liquid before going to bed or out in public.  Avoid or reduce how much caffeine or alcohol you drink.  Give yourself time when you urinate.  Keep all follow-up visits as told by your health care provider. This is important. Contact a health care provider  if:  You have unexplained back pain.  Your symptoms do not get better with treatment.  You develop side effects from the medicine you are taking.  Your urine becomes very dark or has a bad smell.  Your lower abdomen becomes distended and you have trouble passing your urine. Get help right away if:  You have a fever or chills.  You suddenly cannot urinate.  You feel lightheaded, or very dizzy, or you faint.  There are large amounts of blood or clots in the urine.  Your urinary problems become hard to manage.  You develop moderate to severe low back or flank pain. The flank is the side of your body between the ribs and the hip. These symptoms may represent a serious problem that is an emergency. Do not wait to see if the symptoms will go away. Get medical help right away. Call your local emergency services (911 in the [place] Do not drive yourself to the hospital. Summary  Benign prostatic hyperplasia (BPH) is an enlarged prostate that is caused by the normal aging process and not by cancer.  An enlarged prostate can press on the urethra. This can make it hard to pass urine.  This condition is part of a normal aging process and is more likely to develop in men over the age of 50 years.  Get help right away if you suddenly cannot urinate. This information is not intended to replace advice given to you by your health care provider. Make sure you discuss any questions you have with your health care provider. Document Released: 07/22/2005 Document Revised: 06/16/2018 Document Reviewed: 08/26/2016 Elsevier Patient Education  2020 [employer].

## 2019-04-27 ENCOUNTER — Telehealth: Payer: Self-pay | Admitting: *Deleted

## 2019-04-27 LAB — BMP8+EGFR
BUN/Creatinine Ratio: 24 (ref 10–24)
BUN: 25 mg/dL (ref 8–27)
CO2: 20 mmol/L (ref 20–29)
Calcium: 9.3 mg/dL (ref 8.6–10.2)
Chloride: 101 mmol/L (ref 96–106)
Creatinine, Ser: 1.03 mg/dL (ref 0.76–1.27)
GFR calc Af Amer: 87 mL/min/{1.73_m2} (ref >59)
GFR calc non Af Amer: 75 mL/min/{1.73_m2} (ref >59)
Glucose: 277 mg/dL — ABNORMAL HIGH (ref 65–99)
Potassium: 4.9 mmol/L (ref 3.5–5.2)
Sodium: 140 mmol/L (ref 134–144)

## 2019-04-27 NOTE — Telephone Encounter (Addendum)
Prior Auth for Celebrex 200mg  caps-APPROVED  730 capsules for 12 months  Key: [id number]  Your request has been successfully sent to [facility] for review. You may close this dialog, return to your dashboard, and perform other tasks.  To check for an update later, open this request again from your dashboard.  Your request will be reviewed within 24 hours. If needed, you may contact [facility] at [phone].  Pharmacy notified.

## 2019-04-27 NOTE — Telephone Encounter (Addendum)
Prior Auth for Diclofenac Sodium 1% gel-APPROVED for generic  For 365 days  Key: [id number]  Your request has been successfully sent to [facility] for review. You may close this dialog, return to your dashboard, and perform other tasks.  To check for an update later, open this request again from your dashboard.  Your request will be reviewed within 24 hours. If needed, you may contact [facility] at [phone].  Pharmacy notified.

## 2019-05-10 ENCOUNTER — Other Ambulatory Visit: Payer: Self-pay | Admitting: Family

## 2019-06-16 ENCOUNTER — Other Ambulatory Visit: Payer: Self-pay | Admitting: Family

## 2019-06-16 DIAGNOSIS — N401 Enlarged prostate with lower urinary tract symptoms: Secondary | ICD-10-CM

## 2019-06-23 ENCOUNTER — Other Ambulatory Visit: Payer: Self-pay | Admitting: Family

## 2019-06-29 ENCOUNTER — Encounter: Payer: Self-pay | Admitting: Nutrition

## 2019-06-29 ENCOUNTER — Encounter: Payer: PRIVATE HEALTH INSURANCE | Attending: Family | Admitting: Nutrition

## 2019-06-29 ENCOUNTER — Other Ambulatory Visit: Payer: Self-pay

## 2019-06-29 VITALS — Ht 65.0 in | Wt 134.0 lb

## 2019-06-29 DIAGNOSIS — E785 Hyperlipidemia, unspecified: Secondary | ICD-10-CM | POA: Diagnosis present

## 2019-06-29 DIAGNOSIS — E1169 Type 2 diabetes mellitus with other specified complication: Secondary | ICD-10-CM | POA: Insufficient documentation

## 2019-06-29 NOTE — Progress Notes (Signed)
Medical Nutrition Therapy:  Appt start time: 1000 end time:  1100.  Assessment:  Primary concerns today: Diabetes Type 2. Works at [employer], [place] Lives with wife and children. PCP Jannifer Rodney FNP. Trulicity, Steglatro, Metformin 1000 mg BID, Glimepiride 2 mg and Levemeir 45 units a day- He has been taking 20, 25 units twice a day instead of once a day.Marland Kitchen His BMI is 22. He has never been obese.Marland Kitchen He has lost from 152 down to 134 lbs in the last 11 months. Kidney WNL, Lipids WNL.  FBS: 140-160's in am. He only checks once a day usually. Denies low blood sugars. But snacks often due to work schedule to prevent blood sugars from dropping. .A1C 9.1% down from 11.1%. HIs diet is excessive in CHO due to his culture of leavened bread and frequent snacks.  He is motivated and willing to make changes with his diet for improved blood sugars.  He would benefit from simplified treatment of his diabetes medications. He is not a good candidate for SLGT 2 inhibitors or increatin therapy due to not being overweight/obese and having urinary/prostate issues.   He would benefit from maximizing more of his levermir and reducing his Metformin while discontinuing his Steglatro and Trulicity.  CMP Latest Ref Rng & Units 04/26/2019 03/19/2019 02/18/2019  Glucose 65 - 99 mg/dL 062(I) 948(N) 462(V)  BUN 8 - 27 mg/dL 25 26 03(J)  Creatinine 0.76 - 1.27 mg/dL 0.09 3.81 8.29  Sodium 134 - 144 mmol/L 140 139 138  Potassium 3.5 - 5.2 mmol/L 4.9 4.7 4.6  Chloride 96 - 106 mmol/L 101 101 101  CO2 20 - 29 mmol/L 20 23 23   Calcium 8.6 - 10.2 mg/dL 9.3 9.7 9.4  Total Protein 6.0 - 8.5 g/dL - 7.1 7.0  Total Bilirubin 0.0 - 1.2 mg/dL - <9.3  Alkaline Phos 39 - 117 IU/L - 89 79  AST 0 - 40 IU/L - 29 36  ALT 0 - 44 IU/L - 44 49(H)   Lipid Panel     Component Value Date/Time   CHOL 105 02/18/2019 1123   TRIG 90 02/18/2019 1123   HDL 33 (L) 02/18/2019 1123   CHOLHDL 3.2 02/18/2019 1123   LDLCALC 54  02/18/2019 1123   LABVLDL 18 02/18/2019 1123   Lab Results  Component Value Date   HGBA1C 9.1 (H) 04/26/2019   Preferred Learning Style:   No preference indicated   Learning Readiness:     Ready  Change in progress   MEDICATIONS:    DIETARY INTAKE:  24-hr recall:  B ( AM): flatbread, tea, or bagel with cream cheese with tea and milk. Snk ( AM):  Diet sodas, chips or nabs or candy L ( PM): 4 pm flatbread, toat meat, potaotes and vegetables. Snk ( PM): D ( PM): bowl of fruit, Snk ( PM): yogurt  Beverages: diet soda,   Usual physical activity:  Walks some and yard work  Estimated energy needs: 1800-2000  calories 225 g carbohydrates 150 g protein 56 g fat  Progress Towards Goal(s):  In progress.   Nutritional Diagnosis:  NB-1.1 Food and nutrition-related knowledge deficit As related to Diabetes Type 2.  As evidenced by A1C 9.8%.    Intervention:  Nutrition and Diabetes education provided on My Plate, CHO counting, meal planning, portion sizes, timing of meals, avoiding snacks between meals unless having a low blood sugar, target ranges for A1C and blood sugars, signs/symptoms and treatment of hyper/hypoglycemia, monitoring blood sugars, taking medications  as prescribed, benefits of exercising 30 minutes per day and prevention of complications of DM.   Goals Follow My Plate Eat three balanced meals per day at times discussed based on your work schedule. Avoid snacks unless blood sugar is less than 70 mg/dl. Increase lower carb vegetables with meals Cut back on flatbread as this in the quantity you are eating may be contributing to higher blood sugars. Cut out diet sodas and drink only water-4-5  16 oz bottles of water per day.--important to stay hydrated. Eat protein at each meal Take Levermeir 45 units at one time a day before going to bed when you sleep instead of 2 separate doses. Ask your PCP for referral to Endocrinology for your diabetes. Stay active. Don't  lose anymore weight-would like to see you gain 10 lbs in the next 6 months.. Get A1C down to 7%. Call if questions. Let MD know if your blood sugars are less than 70 mg/dl 2-3 times in a row or over 300's.  Teaching Method Utilized:  Visual Auditory Hands on  Handouts given during visit include:  The Plate Method  Diabetes Instructions  Meal Plan Card   Barriers to learning/adherence to lifestyle change: none  Demonstrated degree of understanding via:  Teach Back   Monitoring/Evaluation:  Dietary intake, exercise,  and body weight in 1 month(s).  He would benefit from simplified treatment of his diabetes medications. He is not a good candidate for SLGT 2 inhibitors or increatin therapy due to not being overweight/obese and having urinary and prostate issues..   He would benefit from maximizing his levermir and reducing his Metformin while discontinuing his Steglatro and Trulicity.

## 2019-06-29 NOTE — Patient Instructions (Signed)
Goals Follow My Plate Eat three balanced meals per day at times discussed based on your work schedule. Avoid snacks unless blood sugar is less than 70 mg/dl. Increase lower carb vegetables with meals Cut back on flatbread as this in the quantity you are eating may be contributing to higher blood sugars. Cut out diet sodas and drink only water-4-5  16 oz bottles of water per day.--important to stay hydrated. Eat protein at each meal Take Levermeir 45 units at one time a day before going to bed when you sleep instead of 2 separate doses. Ask your PCP for referral to Endocrinology for your diabetes. Stay active. Don't lose anymore weight-would like to see you gain 10 lbs in the next 6 months.. Get A1C down to 7%. Call if questions. Let MD know if your blood sugars are less than 70 mg/dl 2-3 times in a row or over 300's.

## 2019-07-21 ENCOUNTER — Ambulatory Visit (INDEPENDENT_AMBULATORY_CARE_PROVIDER_SITE_OTHER): Payer: PRIVATE HEALTH INSURANCE | Admitting: Family Medicine

## 2019-07-21 ENCOUNTER — Encounter: Payer: Self-pay | Admitting: Family Medicine

## 2019-07-21 DIAGNOSIS — H66002 Acute suppurative otitis media without spontaneous rupture of ear drum, left ear: Secondary | ICD-10-CM | POA: Diagnosis not present

## 2019-07-21 MED ORDER — AMOXICILLIN-POT CLAVULANATE 875-125 MG PO TABS: 1.0000 | ORAL_TABLET | Freq: Two times a day (BID) | ORAL | 0 refills | Status: DC

## 2019-07-21 NOTE — Progress Notes (Signed)
Virtual Visit via telephone Note  I connected with Jimmy Craig on 07/21/19 at [facility] by telephone and verified that I am speaking with the correct person using two identifiers. Jimmy Craig is currently located at home and no other people are currently with her during visit. The provider, Fransisca Kaufmann Era Parr, MD is located in their office at time of visit.  Call ended at [phone]  I discussed the limitations, risks, security and privacy concerns of performing an evaluation and management service by telephone and the availability of in person appointments. I also discussed with the patient that there may be a patient responsible charge related to this service. The patient expressed understanding and agreed to proceed.   History and Present Illness: Patient is calling in with pain in the right ear and right side of the face. He just started having it 2-3 days ago again. The pain this time is more towards ear.  He denies any fevers or chills or cough.   No diagnosis found.  Outpatient Encounter Medications as of 07/21/2019  Medication Sig  . aspirin EC 81 MG tablet Take 81 mg by mouth daily.  Marland Kitchen atorvastatin (LIPITOR) 40 MG tablet Take 1 tablet (40 mg total) by mouth daily at 6 PM.  . blood glucose meter kit and supplies Dispense based on patient and insurance preference. Use up to four times daily as directed. (FOR ICD-10 E10.9, E11.9).  . celecoxib (CELEBREX) 200 MG capsule Take 1 capsule (200 mg total) by mouth 2 (two) times daily.  . cetirizine (ZYRTEC) 10 MG tablet Take 1 tablet (10 mg total) by mouth daily.  . diclofenac (VOLTAREN) 75 MG EC tablet Take 1 tablet by mouth twice daily  . diclofenac sodium (VOLTAREN) 1 % GEL Apply 4 g topically 4 (four) times daily.  . Dulaglutide (TRULICITY) 1.5 MS/1.1DB SOPN Inject 1.5 mg into the skin once a week.  Marland Kitchen Ertugliflozin L-PyroglutamicAc (STEGLATRO) 15 MG TABS Take 15 mg by mouth daily.  . Ferrous Sulfate (IRON) 325 (65 Fe) MG TABS Take by  mouth.  . fluticasone (FLONASE) 50 MCG/ACT nasal spray Place 2 sprays into both nostrils 2 (two) times daily.  Marland Kitchen glimepiride (AMARYL) 2 MG tablet Take 1 tablet (2 mg total) by mouth daily with breakfast.  . glucose blood (ACCU-CHEK GUIDE) test strip Use 1 strip up to 4 times daily.  E10.9, E11.9  . insulin detemir (LEVEMIR) 100 UNIT/ML injection Inject 0.45 mLs (45 Units total) into the skin at bedtime.  . Insulin Pen Needle (PEN NEEDLES) 32G X 4 MM MISC 1 application by Does not apply route daily.  Marland Kitchen lisinopril (ZESTRIL) 10 MG tablet Take 1 tablet (10 mg total) by mouth daily.  . metFORMIN (GLUCOPHAGE) 1000 MG tablet Take 1 tablet (1,000 mg total) by mouth 2 (two) times daily with a meal.  . Multiple Vitamin (MULTIVITAMIN WITH MINERALS) TABS tablet Take 1 tablet by mouth daily.  Marland Kitchen omeprazole (PRILOSEC) 20 MG capsule Take 1 capsule (20 mg total) by mouth 2 (two) times daily before a meal.  . tamsulosin (FLOMAX) 0.4 MG CAPS capsule Take 1 capsule by mouth once daily   No facility-administered encounter medications on file as of 07/21/2019.    Review of Systems  Constitutional: Negative for chills and fever.  HENT: Positive for ear pain and sinus pain. Negative for congestion, sore throat, tinnitus and trouble swallowing.   Eyes: Negative for visual disturbance.  Respiratory: Negative for cough, shortness of breath and wheezing.   Cardiovascular: Negative for  chest pain and leg swelling.  Musculoskeletal: Negative for back pain and gait problem.  Skin: Negative for rash.  All other systems reviewed and are negative.   Observations/Objective: Patient sounds comfortable and in no acute distress  Assessment and Plan: Problem List Items Addressed This Visit    None    Visit Diagnoses    Non-recurrent acute suppurative otitis media of left ear without spontaneous rupture of tympanic membrane    -  Primary   Relevant Medications   amoxicillin-clavulanate (AUGMENTIN) 875-125 MG tablet         Follow Up Instructions:  Sounds like possible sinusitis versus otitis media on the right side of the his face, left side.  Will treat with Augmentin, if does not improve will give [place] call back.   I discussed the assessment and treatment plan with the patient. The patient was provided an opportunity to ask questions and all were answered. The patient agreed with the plan and demonstrated an understanding of the instructions.   The patient was advised to call back or seek an in-person evaluation if the symptoms worsen or if the condition fails to improve as anticipated.  The above assessment and management plan was discussed with the patient. The patient verbalized understanding of and has agreed to the management plan. Patient is aware to call the clinic if symptoms persist or worsen. Patient is aware when to return to the clinic for a follow-up visit. Patient educated on when it is appropriate to go to the emergency department.    I provided 8 minutes of non-face-to-face time during this encounter.    Worthy Rancher, MD

## 2019-08-12 ENCOUNTER — Ambulatory Visit: Payer: PRIVATE HEALTH INSURANCE | Admitting: Family Medicine

## 2019-08-18 ENCOUNTER — Other Ambulatory Visit: Payer: Self-pay | Admitting: Family

## 2019-08-18 DIAGNOSIS — E785 Hyperlipidemia, unspecified: Secondary | ICD-10-CM

## 2019-08-18 DIAGNOSIS — E1169 Type 2 diabetes mellitus with other specified complication: Secondary | ICD-10-CM

## 2019-08-19 ENCOUNTER — Other Ambulatory Visit: Payer: Self-pay | Admitting: Family

## 2019-08-19 NOTE — Telephone Encounter (Signed)
OV 08/30/19

## 2019-08-24 ENCOUNTER — Telehealth: Payer: Self-pay | Admitting: Nutrition

## 2019-08-24 NOTE — Telephone Encounter (Signed)
He notes he has changed insurance companies and not sure if insurance covers. Will call back to reshedule appt. Advised to talk to PCP about medication assistance from companies for his medications. He notes his BS are running in the 130's now. Advised to keep appt with PCP. Doing much better.

## 2019-08-25 ENCOUNTER — Ambulatory Visit: Payer: PRIVATE HEALTH INSURANCE | Admitting: Nutrition

## 2019-08-27 ENCOUNTER — Ambulatory Visit: Payer: PRIVATE HEALTH INSURANCE | Admitting: Family

## 2019-08-27 ENCOUNTER — Other Ambulatory Visit: Payer: Self-pay

## 2019-08-30 ENCOUNTER — Ambulatory Visit (INDEPENDENT_AMBULATORY_CARE_PROVIDER_SITE_OTHER): Payer: 59 | Admitting: Family

## 2019-08-30 ENCOUNTER — Encounter: Payer: Self-pay | Admitting: Family

## 2019-08-30 ENCOUNTER — Other Ambulatory Visit: Payer: Self-pay

## 2019-08-30 VITALS — BP 140/82 | HR 74 | Temp 96.2°F | Ht 65.0 in | Wt 134.2 lb

## 2019-08-30 DIAGNOSIS — K219 Gastro-esophageal reflux disease without esophagitis: Secondary | ICD-10-CM | POA: Diagnosis not present

## 2019-08-30 DIAGNOSIS — N401 Enlarged prostate with lower urinary tract symptoms: Secondary | ICD-10-CM

## 2019-08-30 DIAGNOSIS — E1159 Type 2 diabetes mellitus with other circulatory complications: Secondary | ICD-10-CM | POA: Diagnosis not present

## 2019-08-30 DIAGNOSIS — H66002 Acute suppurative otitis media without spontaneous rupture of ear drum, left ear: Secondary | ICD-10-CM

## 2019-08-30 DIAGNOSIS — Z23 Encounter for immunization: Secondary | ICD-10-CM

## 2019-08-30 DIAGNOSIS — R351 Nocturia: Secondary | ICD-10-CM

## 2019-08-30 DIAGNOSIS — I10 Essential (primary) hypertension: Secondary | ICD-10-CM

## 2019-08-30 DIAGNOSIS — E1169 Type 2 diabetes mellitus with other specified complication: Secondary | ICD-10-CM

## 2019-08-30 DIAGNOSIS — M79604 Pain in right leg: Secondary | ICD-10-CM

## 2019-08-30 DIAGNOSIS — E785 Hyperlipidemia, unspecified: Secondary | ICD-10-CM

## 2019-08-30 DIAGNOSIS — D509 Iron deficiency anemia, unspecified: Secondary | ICD-10-CM

## 2019-08-30 DIAGNOSIS — E1165 Type 2 diabetes mellitus with hyperglycemia: Secondary | ICD-10-CM

## 2019-08-30 DIAGNOSIS — M8949 Other hypertrophic osteoarthropathy, multiple sites: Secondary | ICD-10-CM

## 2019-08-30 DIAGNOSIS — M159 Polyosteoarthritis, unspecified: Secondary | ICD-10-CM

## 2019-08-30 LAB — BAYER DCA HB A1C WAIVED: HB A1C (BAYER DCA - WAIVED): 7.9 % — ABNORMAL HIGH (ref <7.0)

## 2019-08-30 MED ORDER — GABAPENTIN 100 MG PO CAPS: 100.0000 mg | ORAL_CAPSULE | Freq: Three times a day (TID) | ORAL | 3 refills | Status: DC

## 2019-08-30 NOTE — Progress Notes (Signed)
Subjective:    Patient ID: Jimmy Craig, male    DOB: 05/26/1951, 69 y.o.   MRN: [id number]  Chief Complaint  Patient presents with  . Medical Management of Chronic Issues  . Pain    leg, elbow, hand right side diclofenac gel not helping. patient would like referral    PT presents to the office today for chronic follow up.  Diabetes He presents for his follow-up diabetic visit. He has type 2 diabetes mellitus. His disease course has been stable. There are no hypoglycemic associated symptoms. Associated symptoms include blurred vision ("at times"). Pertinent negatives for diabetes include no foot paresthesias. There are no hypoglycemic complications. Symptoms are stable. Diabetic complications include heart disease. Risk factors for coronary artery disease include dyslipidemia, diabetes mellitus, male sex, hypertension and sedentary lifestyle. He is following a generally unhealthy diet. His overall blood glucose range is 130-140 mg/dl. Eye exam is current.  Hyperlipidemia This is a chronic problem. The current episode started more than 1 year ago. The problem is controlled. Recent lipid tests were reviewed and are normal. Current antihyperlipidemic treatment includes diet change and statins. The current treatment provides moderate improvement of lipids. Risk factors for coronary artery disease include dyslipidemia, diabetes mellitus, male sex, hypertension and a sedentary lifestyle.  Arthritis Presents for follow-up visit. He complains of pain and stiffness. Affected locations include the right MCP, left MCP, right elbow, right knee and right hip. His pain is at a severity of 8/10.  Anemia Presents for follow-up visit. Symptoms include malaise/fatigue. There has been no bruising/bleeding easily.  Gastroesophageal Reflux He complains of belching and heartburn. This is a chronic problem. The current episode started more than 1 year ago. The problem occurs occasionally. The problem has been  waxing and waning. Risk factors include NSAIDs. He has tried a PPI for the symptoms. The treatment provided mild relief.  Benign Prostatic Hypertrophy This is a chronic problem. The current episode started more than 1 year ago. The problem has been waxing and waning since onset. Irritative symptoms include nocturia (3).      Review of Systems  Constitutional: Positive for malaise/fatigue.  Eyes: Positive for blurred vision ("at times").  Gastrointestinal: Positive for heartburn.  Genitourinary: Positive for nocturia (3).  Musculoskeletal: Positive for arthritis and stiffness.  Hematological: Does not bruise/bleed easily.  All other systems reviewed and are negative.      Objective:   Physical Exam Vitals reviewed.  Constitutional:      General: He is not in acute distress.    Appearance: He is well-developed.  HENT:     Head: Normocephalic.     Right Ear: Tympanic membrane normal.     Left Ear: Tympanic membrane normal.  Eyes:     General:        Right eye: No discharge.        Left eye: No discharge.     Pupils: Pupils are equal, round, and reactive to light.  Neck:     Thyroid: No thyromegaly.  Cardiovascular:     Rate and Rhythm: Normal rate and regular rhythm.     Heart sounds: Normal heart sounds. No murmur.  Pulmonary:     Effort: Pulmonary effort is normal. No respiratory distress.     Breath sounds: Normal breath sounds. No wheezing.  Abdominal:     General: Bowel sounds are normal. There is no distension.     Palpations: Abdomen is soft.     Tenderness: There is no abdominal tenderness.  Musculoskeletal:  General: No tenderness. Normal range of motion.     Cervical back: Normal range of motion and neck supple.  Skin:    General: Skin is warm and dry.     Findings: No erythema or rash.  Neurological:     Mental Status: He is alert and oriented to person, place, and time.     Cranial Nerves: No cranial nerve deficit.     Deep Tendon Reflexes:  Reflexes are normal and symmetric.  Psychiatric:        Behavior: Behavior normal.        Thought Content: Thought content normal.        Judgment: Judgment normal.      BP 140/82   Pulse 74   Temp (!) 96.2 F (35.7 C) (Temporal)   Ht 5' 5"  (1.651 m)   Wt 134 lb 3.2 oz (60.9 kg)   SpO2 99%   BMI 22.33 kg/m       Assessment & Plan:  Jimmy Craig comes in today with chief complaint of Medical Management of Chronic Issues and Pain (leg, elbow, hand right side diclofenac gel not helping. patient would like referral )   Diagnosis and orders addressed:  1. Non-recurrent acute suppurative otitis media of left ear without spontaneous rupture of tympanic membrane - CMP14+EGFR - CBC with Differential/Platelet  2. Type 2 diabetes mellitus with hyperglycemia, without long-term current use of insulin (HCC) - Bayer DCA Hb A1c Waived - CMP14+EGFR - CBC with Differential/Platelet - Microalbumin / creatinine urine ratio  3. Hypertension associated with diabetes ([place]) - CMP14+EGFR - CBC with Differential/Platelet  4. GERD without esophagitis - CMP14+EGFR - CBC with Differential/Platelet  5. Hyperlipidemia associated with type 2 diabetes mellitus (HCC) - CMP14+EGFR - CBC with Differential/Platelet  6. Primary osteoarthritis involving multiple joints - CMP14+EGFR - CBC with Differential/Platelet - Ambulatory referral to Orthopedic Surgery  7. Benign prostatic hyperplasia with nocturia - CMP14+EGFR - CBC with Differential/Platelet - PSA, total and free  8. Iron deficiency anemia, unspecified iron deficiency anemia type - CMP14+EGFR - CBC with Differential/Platelet  9. Pain of right lower extremity Will start gabapentin today  - gabapentin (NEURONTIN) 100 MG capsule; Take 1 capsule (100 mg total) by mouth 3 (three) times daily.  Dispense: 90 capsule; Refill: 3   Labs pending Health Maintenance reviewed-TDAP and Prevnar 13 given today Diet and exercise  encouraged  Follow up plan: 3 months    Evelina Dun, FNP

## 2019-08-30 NOTE — Patient Instructions (Addendum)

## 2019-08-31 LAB — CMP14+EGFR
ALT: 26 IU/L (ref 0–44)
AST: 20 IU/L (ref 0–40)
Albumin/Globulin Ratio: 1.4 (ref 1.2–2.2)
Albumin: 4.4 g/dL (ref 3.8–4.8)
Alkaline Phosphatase: 82 IU/L (ref 39–117)
BUN/Creatinine Ratio: 24 (ref 10–24)
BUN: 20 mg/dL (ref 8–27)
Bilirubin Total: 0.2 mg/dL (ref 0.0–1.2)
CO2: 26 mmol/L (ref 20–29)
Calcium: 9.6 mg/dL (ref 8.6–10.2)
Chloride: 98 mmol/L (ref 96–106)
Creatinine, Ser: 0.84 mg/dL (ref 0.76–1.27)
GFR calc Af Amer: 105 mL/min/{1.73_m2} (ref >59)
GFR calc non Af Amer: 91 mL/min/{1.73_m2} (ref >59)
Globulin, Total: 3.1 g/dL (ref 1.5–4.5)
Glucose: 114 mg/dL — ABNORMAL HIGH (ref 65–99)
Potassium: 4.6 mmol/L (ref 3.5–5.2)
Sodium: 137 mmol/L (ref 134–144)
Total Protein: 7.5 g/dL (ref 6.0–8.5)

## 2019-08-31 LAB — CBC WITH DIFFERENTIAL/PLATELET
Basophils Absolute: 0 10*3/uL (ref 0.0–0.2)
Basos: 1 %
EOS (ABSOLUTE): 0.1 10*3/uL (ref 0.0–0.4)
Eos: 2 %
Hematocrit: 42.2 % (ref 37.5–51.0)
Hemoglobin: 13.4 g/dL (ref 13.0–17.7)
Immature Grans (Abs): 0 10*3/uL (ref 0.0–0.1)
Immature Granulocytes: 0 %
Lymphocytes Absolute: 2.3 10*3/uL (ref 0.7–3.1)
Lymphs: 41 %
MCH: 26.7 pg (ref 26.6–33.0)
MCHC: 31.8 g/dL (ref 31.5–35.7)
MCV: 84 fL (ref 79–97)
Monocytes Absolute: 0.5 10*3/uL (ref 0.1–0.9)
Monocytes: 9 %
Neutrophils Absolute: 2.7 10*3/uL (ref 1.4–7.0)
Neutrophils: 47 %
Platelets: 347 10*3/uL (ref 150–450)
RBC: 5.01 x10E6/uL (ref 4.14–5.80)
RDW: 14 % (ref 11.6–15.4)
WBC: 5.7 10*3/uL (ref 3.4–10.8)

## 2019-08-31 LAB — PSA, TOTAL AND FREE
PSA, Free Pct: 36.7 %
PSA, Free: 0.11 ng/mL
Prostate Specific Ag, Serum: 0.3 ng/mL (ref 0.0–4.0)

## 2019-08-31 LAB — MICROALBUMIN / CREATININE URINE RATIO
Creatinine, Urine: 91.3 mg/dL
Microalb/Creat Ratio: 17 mg/g creat (ref 0–29)
Microalbumin, Urine: 15.2 ug/mL

## 2019-09-01 ENCOUNTER — Encounter: Payer: Self-pay | Admitting: Family Medicine

## 2019-09-02 ENCOUNTER — Other Ambulatory Visit: Payer: Self-pay | Admitting: Family

## 2019-09-02 ENCOUNTER — Encounter: Payer: Self-pay | Admitting: Orthopaedic Surgery

## 2019-09-13 ENCOUNTER — Telehealth: Payer: Self-pay | Admitting: *Deleted

## 2019-09-13 NOTE — Telephone Encounter (Signed)
Prior Auth for [facility] 15mg  tabs-In Process  Key: -   PA Case ID: [id number]  [employer] has received your information, and the request will be reviewed. You may close this dialog, return to your dashboard, and perform other tasks.  You will receive an electronic determination in CoverMyMeds. You can see the latest determination by locating this request on your dashboard or by reopening this request. You will also receive a faxed copy of the determination. If you have any questions please contact [employer] at [phone].  If you need assistance, please chat with CoverMyMeds or call [phone] at [phone].

## 2019-09-14 NOTE — Telephone Encounter (Signed)
Prior Auth for [employer] 15mg  tabs-APPROVED till 09/12/20  Pharmacy notified

## 2019-09-21 ENCOUNTER — Other Ambulatory Visit: Payer: Self-pay

## 2019-09-21 ENCOUNTER — Encounter: Payer: Self-pay | Admitting: Orthopaedic Surgery

## 2019-09-21 ENCOUNTER — Ambulatory Visit: Payer: 59

## 2019-09-21 ENCOUNTER — Ambulatory Visit (INDEPENDENT_AMBULATORY_CARE_PROVIDER_SITE_OTHER): Payer: 59 | Admitting: Orthopaedic Surgery

## 2019-09-21 VITALS — BP 136/80 | HR 85 | Ht 65.0 in | Wt 137.0 lb

## 2019-09-21 DIAGNOSIS — M25512 Pain in left shoulder: Secondary | ICD-10-CM

## 2019-09-21 DIAGNOSIS — G8929 Other chronic pain: Secondary | ICD-10-CM

## 2019-09-21 MED ORDER — NAPROXEN 500 MG PO TABS: 500.0000 mg | ORAL_TABLET | Freq: Two times a day (BID) | ORAL | 5 refills | Status: DC

## 2019-09-21 NOTE — Progress Notes (Signed)
Subjective:    Patient ID: Jimmy Craig, male    DOB: 1950-10-09, 69 y.o.   MRN: [id number]  HPI He has had left shoulder pain for over a year. He has no trauma.  He was seen about a year ago and had x-rays in [place].  He did not bring the disk.  He has been seen at [facility] and I have reviewed the notes.  He has pain with overhead use of the hand and sleeping on it at night.  He has no numbness, no swelling, no redness.  He has tried ice, heat and tylenol with little help.  He recently got insurance and wants it checked out now.    He has some slight neck pain at times but no numbness.  No trauma.   Review of Systems  Musculoskeletal: Positive for arthralgias and neck pain.  All other systems reviewed and are negative.  For Review of Systems, all other systems reviewed and are negative.  The following is a summary of the past history medically, past history surgically, known current medicines, social history and family history.  This information is gathered electronically by the computer from prior information and documentation.  I review this each visit and have found including this information at this point in the chart is beneficial and informative.   Past Medical History:  Diagnosis Date  . Arthritis   . Diabetes mellitus without complication ([place])   . GERD (gastroesophageal reflux disease)     Past Surgical History:  Procedure Laterality Date  . CATARACT EXTRACTION Right   . CATARACT EXTRACTION W/PHACO Left 12/20/2013   Procedure: CATARACT EXTRACTION PHACO AND INTRAOCULAR LENS PLACEMENT (IOC);  Surgeon: Williams Che, MD;  Location: [facility];  Service: Ophthalmology;  Laterality: Left;  CDE: 0.97    Current Outpatient Medications on File Prior to Visit  Medication Sig Dispense Refill  . aspirin EC 81 MG tablet Take 81 mg by mouth daily.    Marland Kitchen atorvastatin (LIPITOR) 40 MG tablet TAKE 1 TABLET BY MOUTH ONCE DAILY AT 6 PM 90 tablet 0  . blood glucose meter kit and  supplies Dispense based on patient and insurance preference. Use up to four times daily as directed. (FOR ICD-10 E10.9, E11.9). 1 each 0  . cetirizine (ZYRTEC) 10 MG tablet Take 1 tablet (10 mg total) by mouth daily. 90 tablet 2  . diclofenac sodium (VOLTAREN) 1 % GEL Apply 4 g topically 4 (four) times daily. 350 g 2  . Ertugliflozin L-PyroglutamicAc (STEGLATRO) 15 MG TABS Take 15 mg by mouth daily. 90 tablet 1  . fluticasone (FLONASE) 50 MCG/ACT nasal spray Place 2 sprays into both nostrils 2 (two) times daily. 11.1 g 1  . gabapentin (NEURONTIN) 100 MG capsule Take 1 capsule (100 mg total) by mouth 3 (three) times daily. 90 capsule 3  . glimepiride (AMARYL) 2 MG tablet Take 1 tablet by mouth once daily with breakfast 90 tablet 0  . glucose blood (ACCU-CHEK GUIDE) test strip Use 1 strip up to 4 times daily.  E10.9, E11.9 100 each 12  . insulin detemir (LEVEMIR) 100 UNIT/ML injection Inject 0.45 mLs (45 Units total) into the skin at bedtime. 10 mL 11  . Insulin Pen Needle (PEN NEEDLES) 32G X 4 MM MISC 1 application by Does not apply route daily. 100 each 11  . lisinopril (ZESTRIL) 10 MG tablet Take 1 tablet (10 mg total) by mouth daily. 90 tablet 3  . metFORMIN (GLUCOPHAGE) 1000 MG tablet Take 1  tablet (1,000 mg total) by mouth 2 (two) times daily with a meal. 180 tablet 3  . Multiple Vitamin (MULTIVITAMIN WITH MINERALS) TABS tablet Take 1 tablet by mouth daily.    Marland Kitchen omeprazole (PRILOSEC) 20 MG capsule Take 1 capsule (20 mg total) by mouth 2 (two) times daily before a meal. 60 capsule 3  . tamsulosin (FLOMAX) 0.4 MG CAPS capsule Take 1 capsule by mouth once daily 90 capsule 1  . TRULICITY 1.5 MH/6.8GS SOPN INJECT CONTENTS OF ONE PEN INTO THE SKIN ONCE A WEEK 12 mL 0   No current facility-administered medications on file prior to visit.    Social History   Socioeconomic History  . Marital status: Single    Spouse name: Not on file  . Number of children: Not on file  . Years of education: Not  on file  . Highest education level: Not on file  Occupational History  . Not on file  Tobacco Use  . Smoking status: Never Smoker  . Smokeless tobacco: Never Used  Substance and Sexual Activity  . Alcohol use: No  . Drug use: No  . Sexual activity: Yes    Birth control/protection: None  Other Topics Concern  . Not on file  Social History Narrative  . Not on file   Social Determinants of Health   Financial Resource Strain:   . Difficulty of Paying Living Expenses: Not on file  Food Insecurity:   . Worried About Charity fundraiser in the Last Year: Not on file  . Ran Out of Food in the Last Year: Not on file  Transportation Needs:   . Lack of Transportation (Medical): Not on file  . Lack of Transportation (Non-Medical): Not on file  Physical Activity:   . Days of Exercise per Week: Not on file  . Minutes of Exercise per Session: Not on file  Stress:   . Feeling of Stress : Not on file  Social Connections:   . Frequency of Communication with Friends and Family: Not on file  . Frequency of Social Gatherings with Friends and Family: Not on file  . Attends Religious Services: Not on file  . Active Member of Clubs or Organizations: Not on file  . Attends Archivist Meetings: Not on file  . Marital Status: Not on file  Intimate Partner Violence:   . Fear of Current or Ex-Partner: Not on file  . Emotionally Abused: Not on file  . Physically Abused: Not on file  . Sexually Abused: Not on file    Family History  Problem Relation Age of Onset  . Diabetes Mother     BP 136/80   Pulse 85   Ht '5\' 5"'$  (1.651 m)   Wt 137 lb (62.1 kg)   BMI 22.80 kg/m   Body mass index is 22.8 kg/m.     Objective:   Physical Exam Vitals and nursing note reviewed.  Constitutional:      Appearance: He is well-developed.  HENT:     Head: Normocephalic and atraumatic.  Eyes:     Conjunctiva/sclera: Conjunctivae normal.     Pupils: Pupils are equal, round, and reactive to  light.  Cardiovascular:     Rate and Rhythm: Normal rate and regular rhythm.  Pulmonary:     Effort: Pulmonary effort is normal.  Abdominal:     Palpations: Abdomen is soft.  Musculoskeletal:       Arms:     Cervical back: Normal range of motion and neck  supple.  Skin:    General: Skin is warm and dry.  Neurological:     Mental Status: He is alert and oriented to person, place, and time.     Cranial Nerves: No cranial nerve deficit.     Motor: No abnormal muscle tone.     Coordination: Coordination normal.     Deep Tendon Reflexes: Reflexes are normal and symmetric. Reflexes normal.  Psychiatric:        Behavior: Behavior normal.        Thought Content: Thought content normal.        Judgment: Judgment normal.    X-rays were done of the left shoulder, reported separately.       Assessment & Plan:   Encounter Diagnosis  Name Primary?  . Chronic left shoulder pain Yes   PROCEDURE NOTE:  The patient request injection, verbal consent was obtained.  The left shoulder was prepped appropriately after time out was performed.   Sterile technique was observed and injection of 1 cc of Depo-Medrol 40 mg with several cc's of plain xylocaine. Anesthesia was provided by ethyl chloride and a 20-gauge needle was used to inject the shoulder area. A posterior approach was used.  The injection was tolerated well.  A band aid dressing was applied.  The patient was advised to apply ice later today and tomorrow to the injection sight as needed.  I will call in Naprosyn 500 po bid pc.  Return in three weeks.  Call if any problem.  Precautions discussed.   Electronically Signed Sanjuana Kava, MD 2/16/20212:48 PM

## 2019-10-08 ENCOUNTER — Ambulatory Visit: Payer: 59 | Attending: Internal Medicine

## 2019-10-08 ENCOUNTER — Ambulatory Visit: Payer: 59

## 2019-10-08 DIAGNOSIS — Z23 Encounter for immunization: Secondary | ICD-10-CM

## 2019-10-08 NOTE — Progress Notes (Signed)
   Covid-19 Vaccination Clinic  Name:  Uthman Mroczkowski    MRN: [id number] DOB: [birth year]  10/08/2019  Mr. House was observed post Covid-19 immunization for 15 minutes without incident. He was provided with Vaccine Information Sheet and instruction to access the V-Safe system.   Mr. Wardlow was instructed to call 911 with any severe reactions post vaccine: Marland Kitchen Difficulty breathing  . Swelling of face and throat  . A fast heartbeat  . A bad rash all over body  . Dizziness and weakness   Immunizations Administered    Name Date Dose VIS Date Route   Moderna COVID-19 Vaccine 10/08/2019 11:16 AM 0.5 mL 07/06/2019 Intramuscular   Manufacturer: Moderna   Lot: [id number]   NDC: 13143-888-75

## 2019-10-10 ENCOUNTER — Other Ambulatory Visit: Payer: Self-pay | Admitting: Family

## 2019-10-20 ENCOUNTER — Encounter: Payer: Self-pay | Admitting: Orthopaedic Surgery

## 2019-10-20 ENCOUNTER — Ambulatory Visit (INDEPENDENT_AMBULATORY_CARE_PROVIDER_SITE_OTHER): Payer: 59 | Admitting: Orthopaedic Surgery

## 2019-10-20 ENCOUNTER — Other Ambulatory Visit: Payer: Self-pay

## 2019-10-20 VITALS — BP 132/70 | HR 78 | Ht 65.0 in | Wt 138.0 lb

## 2019-10-20 DIAGNOSIS — M25512 Pain in left shoulder: Secondary | ICD-10-CM

## 2019-10-20 DIAGNOSIS — G8929 Other chronic pain: Secondary | ICD-10-CM | POA: Diagnosis not present

## 2019-10-20 NOTE — Progress Notes (Signed)
PROCEDURE NOTE:  The patient request injection, verbal consent was obtained.  The left shoulder was prepped appropriately after time out was performed.   Sterile technique was observed and injection of 1 cc of Depo-Medrol 40 mg with several cc's of plain xylocaine. Anesthesia was provided by ethyl chloride and a 20-gauge needle was used to inject the shoulder area. A posterior approach was used.  The injection was tolerated well.  A band aid dressing was applied.  The patient was advised to apply ice later today and tomorrow to the injection sight as needed.  He has other joint problems of right knee, right hip and right elbow.  I can see him for that in another appointment.  He will call the [facility]  office for this.    Electronically Signed Darreld Mclean, MD 3/17/20219:28 AM

## 2019-10-30 ENCOUNTER — Other Ambulatory Visit: Payer: Self-pay | Admitting: Family

## 2019-10-30 DIAGNOSIS — K219 Gastro-esophageal reflux disease without esophagitis: Secondary | ICD-10-CM

## 2019-11-02 ENCOUNTER — Other Ambulatory Visit: Payer: Self-pay

## 2019-11-02 ENCOUNTER — Encounter: Payer: Self-pay | Admitting: Orthopaedic Surgery

## 2019-11-02 ENCOUNTER — Ambulatory Visit (INDEPENDENT_AMBULATORY_CARE_PROVIDER_SITE_OTHER): Payer: 59 | Admitting: Orthopaedic Surgery

## 2019-11-02 ENCOUNTER — Ambulatory Visit: Payer: 59

## 2019-11-02 VITALS — Ht 65.0 in | Wt 138.0 lb

## 2019-11-02 DIAGNOSIS — G8929 Other chronic pain: Secondary | ICD-10-CM | POA: Diagnosis not present

## 2019-11-02 DIAGNOSIS — M25561 Pain in right knee: Secondary | ICD-10-CM

## 2019-11-02 DIAGNOSIS — M79641 Pain in right hand: Secondary | ICD-10-CM

## 2019-11-02 NOTE — Progress Notes (Signed)
Patient [place]:HCWCBJSE Jimmy Craig, male DOB:July 26, 1951, 69 y.o. GBT:[id number]  Chief Complaint  Patient presents with  . Hand Pain  . Leg Pain    HPI  Jimmy Craig is a 69 y.o. male who has pain of the right long finger. It hurts and "sticks" sometimes.  He has no trauma, no redness, no numbness.  He has pain of the right calf at times, more after walking a while.  He gets cramps at times.  He has no trauma.  He has some knee pain on the right as well.  He has no swelling or giving way.  He is taking Neurontin, Naprosyn and uses Voltaren Gel.   Body mass index is 22.96 kg/m.  ROS  Review of Systems  Musculoskeletal: Positive for arthralgias and neck pain.  All other systems reviewed and are negative.   All other systems reviewed and are negative.  The following is a summary of the past history medically, past history surgically, known current medicines, social history and family history.  This information is gathered electronically by the computer from prior information and documentation.  I review this each visit and have found including this information at this point in the chart is beneficial and informative.    Past Medical History:  Diagnosis Date  . Arthritis   . Diabetes mellitus without complication ([place])   . GERD (gastroesophageal reflux disease)     Past Surgical History:  Procedure Laterality Date  . CATARACT EXTRACTION Right   . CATARACT EXTRACTION W/PHACO Left 12/20/2013   Procedure: CATARACT EXTRACTION PHACO AND INTRAOCULAR LENS PLACEMENT (IOC);  Surgeon: Williams Che, MD;  Location: [facility];  Service: Ophthalmology;  Laterality: Left;  CDE: 0.97    Family History  Problem Relation Age of Onset  . Diabetes Mother     Social History Social History   Tobacco Use  . Smoking status: Never Smoker  . Smokeless tobacco: Never Used  Substance Use Topics  . Alcohol use: No  . Drug use: No    No Known Allergies  Current Outpatient Medications   Medication Sig Dispense Refill  . aspirin EC 81 MG tablet Take 81 mg by mouth daily.    Marland Kitchen atorvastatin (LIPITOR) 40 MG tablet TAKE 1 TABLET BY MOUTH ONCE DAILY AT 6 PM 90 tablet 0  . blood glucose meter kit and supplies Dispense based on patient and insurance preference. Use up to four times daily as directed. (FOR ICD-10 E10.9, E11.9). 1 each 0  . cetirizine (ZYRTEC) 10 MG tablet Take 1 tablet (10 mg total) by mouth daily. 90 tablet 2  . diclofenac sodium (VOLTAREN) 1 % GEL Apply 4 g topically 4 (four) times daily. 350 g 2  . fluticasone (FLONASE) 50 MCG/ACT nasal spray Place 2 sprays into both nostrils 2 (two) times daily. 11.1 g 1  . gabapentin (NEURONTIN) 100 MG capsule Take 1 capsule (100 mg total) by mouth 3 (three) times daily. 90 capsule 3  . glimepiride (AMARYL) 2 MG tablet Take 1 tablet by mouth once daily with breakfast 90 tablet 0  . glucose blood (ACCU-CHEK GUIDE) test strip Use 1 strip up to 4 times daily.  E10.9, E11.9 100 each 12  . insulin detemir (LEVEMIR) 100 UNIT/ML injection Inject 0.45 mLs (45 Units total) into the skin at bedtime. 10 mL 11  . Insulin Pen Needle (PEN NEEDLES) 32G X 4 MM MISC 1 application by Does not apply route daily. 100 each 11  . lisinopril (ZESTRIL) 10 MG tablet Take 1  tablet (10 mg total) by mouth daily. 90 tablet 3  . metFORMIN (GLUCOPHAGE) 1000 MG tablet Take 1 tablet (1,000 mg total) by mouth 2 (two) times daily with a meal. 180 tablet 3  . Multiple Vitamin (MULTIVITAMIN WITH MINERALS) TABS tablet Take 1 tablet by mouth daily.    . naproxen (NAPROSYN) 500 MG tablet Take 1 tablet (500 mg total) by mouth 2 (two) times daily with a meal. 60 tablet 5  . omeprazole (PRILOSEC) 20 MG capsule TAKE 1 CAPSULE BY MOUTH TWICE DAILY BEFORE A  MEAL 60 capsule 0  . STEGLATRO 15 MG TABS Take 1 tablet by mouth once daily 30 tablet 0  . tamsulosin (FLOMAX) 0.4 MG CAPS capsule Take 1 capsule by mouth once daily 90 capsule 1  . TRULICITY 1.5 BF/3.8VA SOPN INJECT  CONTENTS OF ONE PEN INTO THE SKIN ONCE A WEEK 12 mL 0   No current facility-administered medications for this visit.     Physical Exam  Height 5' 5"  (1.651 m), weight 138 lb (62.6 kg).  Constitutional: overall normal hygiene, normal nutrition, well developed, normal grooming, normal body habitus. Assistive device:none  Musculoskeletal: gait and station Limp none, muscle tone and strength are normal, no tremors or atrophy is present.  .  Neurological: coordination overall normal.  Deep tendon reflex/nerve stretch intact.  Sensation normal.  Cranial nerves II-XII intact.   Skin:   Normal overall no scars, lesions, ulcers or rashes. No psoriasis.  Psychiatric: Alert and oriented x 3.  Recent memory intact, remote memory unclear.  Normal mood and affect. Well groomed.  Good eye contact.  Cardiovascular: overall no swelling, no varicosities, no edema bilaterally, normal temperatures of the legs and arms, no clubbing, cyanosis and good capillary refill.  Lymphatic: palpation is normal.  He is tender over the A1 pulley of the right long finger but has no locking today.  NV intact.  ROM is full. He has tenderness dorsal side as well.  He has no effusion of the right knee, has full motion, stable, normal gait.  He has normal NV status.  Calf is not tender.  He has no edema.  All other systems reviewed and are negative   The patient has been educated about the nature of the problem(s) and counseled on treatment options.  The patient appeared to understand what I have discussed and is in agreement with it.  X-rays were done of the right hand and right knee, reported separately. Encounter Diagnoses  Name Primary?  . Chronic pain of right hand Yes  . Chronic pain of right knee     PLAN Call if any problems.  Precautions discussed.  Continue current medications. Use the Voltaren Gel on the hand tid dorsally over the third metacarpal head.  Continue the Naprosyn.  Return to clinic 3  weeks   Electronically Signed Sanjuana Kava, MD 3/30/20212:34 PM

## 2019-11-10 ENCOUNTER — Ambulatory Visit: Payer: 59 | Attending: Internal Medicine

## 2019-11-10 DIAGNOSIS — Z23 Encounter for immunization: Secondary | ICD-10-CM

## 2019-11-10 NOTE — Progress Notes (Signed)
   Covid-19 Vaccination Clinic  Name:  Jimmy Craig    MRN: [id number] DOB: Apr 05, 1951  11/10/2019  Jimmy Craig was observed post Covid-19 immunization for 15 minutes without incident. He was provided with Vaccine Information Sheet and instruction to access the V-Safe system.   Jimmy Craig was instructed to call 911 with any severe reactions post vaccine: Marland Kitchen Difficulty breathing  . Swelling of face and throat  . A fast heartbeat  . A bad rash all over body  . Dizziness and weakness   Immunizations Administered    Name Date Dose VIS Date Route   Moderna COVID-19 Vaccine 11/10/2019 10:57 AM 0.5 mL 07/06/2019 Intramuscular   Manufacturer: Gala Murdoch   Lot: [id number]   NDC: 94854-627-03

## 2019-12-01 ENCOUNTER — Ambulatory Visit: Payer: 59 | Admitting: Orthopaedic Surgery

## 2019-12-05 ENCOUNTER — Other Ambulatory Visit: Payer: Self-pay | Admitting: Family

## 2019-12-05 DIAGNOSIS — E785 Hyperlipidemia, unspecified: Secondary | ICD-10-CM

## 2019-12-05 DIAGNOSIS — E1169 Type 2 diabetes mellitus with other specified complication: Secondary | ICD-10-CM

## 2019-12-07 ENCOUNTER — Ambulatory Visit: Payer: 59

## 2019-12-07 ENCOUNTER — Other Ambulatory Visit: Payer: Self-pay | Admitting: Family

## 2019-12-07 ENCOUNTER — Other Ambulatory Visit: Payer: Self-pay

## 2019-12-07 ENCOUNTER — Encounter: Payer: Self-pay | Admitting: Orthopaedic Surgery

## 2019-12-07 ENCOUNTER — Ambulatory Visit (INDEPENDENT_AMBULATORY_CARE_PROVIDER_SITE_OTHER): Payer: 59 | Admitting: Orthopaedic Surgery

## 2019-12-07 VITALS — BP 132/65 | HR 87 | Temp 97.5°F | Wt 136.0 lb

## 2019-12-07 DIAGNOSIS — M5440 Lumbago with sciatica, unspecified side: Secondary | ICD-10-CM

## 2019-12-07 DIAGNOSIS — K219 Gastro-esophageal reflux disease without esophagitis: Secondary | ICD-10-CM

## 2019-12-07 DIAGNOSIS — M5441 Lumbago with sciatica, right side: Secondary | ICD-10-CM | POA: Diagnosis not present

## 2019-12-07 DIAGNOSIS — G8929 Other chronic pain: Secondary | ICD-10-CM

## 2019-12-07 MED ORDER — DICLOFENAC SODIUM 75 MG PO TBEC: 75.0000 mg | DELAYED_RELEASE_TABLET | Freq: Two times a day (BID) | ORAL | 2 refills | Status: DC

## 2019-12-07 NOTE — Progress Notes (Signed)
Patient Jimmy Craig, male DOB:1951-05-10, 69 y.o. DJM:[id number]  Chief Complaint  Patient presents with  . Hand Pain    still has pain in right hand and arm  . Arm Pain  . Leg Pain    still has calf pain, hurts badly if stands for > 15 ,minutes, denies n/t, no specific LBP, right troch hurts    HPI  Jimmy Craig is a 69 y.o. male who has multiple complaints.  First, he has had some pain in the left shoulder.  It is better.  He has no trauma, no numbness.  He has had some right calf pain that is worse at night.  He has right hip pain that is worse at night and after walking a while.  He has had some right knee pain but that is better.  On further questioning, his pain to the hip and lower leg runs down the leg from the buttocks to the right lateral foot.  He has some back pain but not much.  He has no trauma, no weakness.    I am concerned about pain from the back to the foot, sciatica.  He had a [facility] years ago at [facility]  He will get the information.  I will begin PT for his lower back in [place].  I will see him in the [place] office in three weeks.   Body mass index is 22.63 kg/m.  ROS  Review of Systems  Musculoskeletal: Positive for arthralgias and neck pain.  All other systems reviewed and are negative.   All other systems reviewed and are negative.  The following is a summary of the past history medically, past history surgically, known current medicines, social history and family history.  This information is gathered electronically by the computer from prior information and documentation.  I review this each visit and have found including this information at this point in the chart is beneficial and informative.    Past Medical History:  Diagnosis Date  . Arthritis   . Diabetes mellitus without complication ([place])   . GERD (gastroesophageal reflux disease)     Past Surgical History:  Procedure Laterality Date  . CATARACT EXTRACTION Right    . CATARACT EXTRACTION W/PHACO Left 12/20/2013   Procedure: CATARACT EXTRACTION PHACO AND INTRAOCULAR LENS PLACEMENT (IOC);  Surgeon: Williams Che, MD;  Location: [facility];  Service: Ophthalmology;  Laterality: Left;  CDE: 0.97    Family History  Problem Relation Age of Onset  . Diabetes Mother     Social History Social History   Tobacco Use  . Smoking status: Never Smoker  . Smokeless tobacco: Never Used  Substance Use Topics  . Alcohol use: No  . Drug use: No    No Known Allergies  Current Outpatient Medications  Medication Sig Dispense Refill  . aspirin EC 81 MG tablet Take 81 mg by mouth daily.    Marland Kitchen atorvastatin (LIPITOR) 40 MG tablet Take 1 tablet (40 mg total) by mouth daily. (Needs to be seen before next refill) 30 tablet 0  . blood glucose meter kit and supplies Dispense based on patient and insurance preference. Use up to four times daily as directed. (FOR ICD-10 E10.9, E11.9). 1 each 0  . cetirizine (ZYRTEC) 10 MG tablet Take 1 tablet (10 mg total) by mouth daily. 90 tablet 2  . diclofenac sodium (VOLTAREN) 1 % GEL Apply 4 g topically 4 (four) times daily. 350 g 2  . Ertugliflozin L-PyroglutamicAc (STEGLATRO) 15 MG TABS Take 1  tablet by mouth daily. (Needs to be seen before next refill) 30 tablet 0  . fluticasone (FLONASE) 50 MCG/ACT nasal spray Place 2 sprays into both nostrils 2 (two) times daily. 11.1 g 1  . gabapentin (NEURONTIN) 100 MG capsule Take 1 capsule (100 mg total) by mouth 3 (three) times daily. 90 capsule 3  . glimepiride (AMARYL) 2 MG tablet Take 1 tablet by mouth once daily with breakfast 90 tablet 0  . glucose blood (ACCU-CHEK GUIDE) test strip Use 1 strip up to 4 times daily.  E10.9, E11.9 100 each 12  . insulin detemir (LEVEMIR) 100 UNIT/ML injection Inject 0.45 mLs (45 Units total) into the skin at bedtime. 10 mL 11  . Insulin Pen Needle (PEN NEEDLES) 32G X 4 MM MISC 1 application by Does not apply route daily. 100 each 11  . lisinopril (ZESTRIL)  10 MG tablet Take 1 tablet (10 mg total) by mouth daily. 90 tablet 3  . metFORMIN (GLUCOPHAGE) 1000 MG tablet Take 1 tablet (1,000 mg total) by mouth 2 (two) times daily with a meal. 180 tablet 3  . Multiple Vitamin (MULTIVITAMIN WITH MINERALS) TABS tablet Take 1 tablet by mouth daily.    . tamsulosin (FLOMAX) 0.4 MG CAPS capsule Take 1 capsule by mouth once daily 90 capsule 1  . TRULICITY 1.5 XV/4.0GQ SOPN INJECT CONTENTS OF ONE PEN INTO THE SKIN ONCE A WEEK 12 mL 0  . diclofenac (VOLTAREN) 75 MG EC tablet Take 1 tablet (75 mg total) by mouth 2 (two) times daily with a meal. 60 tablet 2  . omeprazole (PRILOSEC) 20 MG capsule TAKE 1 CAPSULE BY MOUTH TWICE DAILY BEFORE A MEAL 60 capsule 1   No current facility-administered medications for this visit.     Physical Exam  Blood pressure 132/65, pulse 87, temperature (!) 97.5 F (36.4 C), weight 136 lb (61.7 kg).  Constitutional: overall normal hygiene, normal nutrition, well developed, normal grooming, normal body habitus. Assistive device:none  Musculoskeletal: gait and station Limp none, muscle tone and strength are normal, no tremors or atrophy is present.  .  Neurological: coordination overall normal.  Deep tendon reflex/nerve stretch intact.  Sensation normal.  Cranial nerves II-XII intact.   Skin:   Normal overall no scars, lesions, ulcers or rashes. No psoriasis.  Psychiatric: Alert and oriented x 3.  Recent memory intact, remote memory unclear.  Normal mood and affect. Well groomed.  Good eye contact.  Cardiovascular: overall no swelling, no varicosities, no edema bilaterally, normal temperatures of the legs and arms, no clubbing, cyanosis and good capillary refill.  Lymphatic: palpation is normal.  Spine/Pelvis examination:  Inspection:  Overall, sacoiliac joint benign and hips nontender; without crepitus or defects.   Thoracic spine inspection: Alignment normal without kyphosis present   Lumbar spine inspection:  Alignment   with normal lumbar lordosis, without scoliosis apparent.   Thoracic spine palpation:  without tenderness of spinal processes   Lumbar spine palpation: without tenderness of lumbar area; without tightness of lumbar muscles    Range of Motion:   Lumbar flexion, forward flexion is normal without pain or tenderness    Lumbar extension is full without pain or tenderness   Left lateral bend is normal without pain or tenderness   Right lateral bend is normal without pain or tenderness   Straight leg raising is normal  Strength & tone: normal   Stability overall normal stability  Left shoulder has full ROM.  Right knee has full ROM.  NV intact.  All other systems reviewed and are negative   The patient has been educated about the nature of the problem(s) and counseled on treatment options.  The patient appeared to understand what I have discussed and is in agreement with it.  Encounter Diagnosis  Name Primary?  . Chronic midline low back pain with right-sided sciatica Yes   X-rays were done of the lumbar spine, reported separately.  PLAN Call if any problems.  Precautions discussed.  Continue current medications but stop the Naprosyn, begin diclofenac.   Return to clinic 3 weeks   I will see in [facility].  Begin PT in [place] of lower back.  Electronically Signed Sanjuana Kava, MD 5/4/20213:32 PM

## 2019-12-09 NOTE — Addendum Note (Signed)
Addended by: Baird Kay on: 12/09/2019 10:14 AM   Modules accepted: Orders

## 2019-12-13 ENCOUNTER — Telehealth: Payer: Self-pay | Admitting: Family

## 2019-12-13 DIAGNOSIS — E1169 Type 2 diabetes mellitus with other specified complication: Secondary | ICD-10-CM

## 2019-12-13 MED ORDER — BLOOD GLUCOSE METER KIT: PACK | 0 refills | Status: AC

## 2019-12-13 MED ORDER — PEN NEEDLES 32G X 4 MM MISC: 1.0000 "application " | Freq: Every day | 11 refills | Status: AC

## 2019-12-13 MED ORDER — ACCU-CHEK GUIDE VI STRP: ORAL_STRIP | 12 refills | Status: DC

## 2019-12-13 NOTE — Telephone Encounter (Signed)
Rx left for provider to sign and her nurse will fax.

## 2019-12-13 NOTE — Telephone Encounter (Signed)
Prescription sent to pharmacy, please fax rx to [facility] for meter.

## 2019-12-13 NOTE — Telephone Encounter (Signed)
Pt called stating that he needs glucose meter, glucose strips, and syringes. Pt has an appt on 12/27/19 to see [facility] for follow up.

## 2019-12-15 ENCOUNTER — Other Ambulatory Visit: Payer: Self-pay

## 2019-12-15 ENCOUNTER — Ambulatory Visit (HOSPITAL_COMMUNITY): Payer: 59 | Attending: Orthopaedic Surgery | Admitting: Physical Therapy

## 2019-12-15 ENCOUNTER — Encounter (HOSPITAL_COMMUNITY): Payer: Self-pay | Admitting: Physical Therapy

## 2019-12-15 DIAGNOSIS — M545 Low back pain, unspecified: Secondary | ICD-10-CM

## 2019-12-15 DIAGNOSIS — M6281 Muscle weakness (generalized): Secondary | ICD-10-CM | POA: Diagnosis present

## 2019-12-15 DIAGNOSIS — R29898 Other symptoms and signs involving the musculoskeletal system: Secondary | ICD-10-CM | POA: Diagnosis present

## 2019-12-15 DIAGNOSIS — R2689 Other abnormalities of gait and mobility: Secondary | ICD-10-CM

## 2019-12-15 NOTE — Therapy (Signed)
[place], [place], [address] Phone: [phone]   Fax:  [phone]  Physical Therapy Evaluation  Patient Details  Name: Jimmy Craig MRN: [id number] Date of Birth: Jan 07, 1951 Referring Provider (PT): Sanjuana Kava MD   Encounter Date: 12/15/2019  PT End of Session - 12/15/19 1559    Visit Number  1    Number of Visits  8    Date for PT Re-Evaluation  01/12/20    Authorization Type  [employer] (V.L. 30 combined, no auth)    Progress Note Due on Visit  8    PT Start Time  1350    PT Stop Time  1429    PT Time Calculation (min)  39 min    Activity Tolerance  Patient tolerated treatment well    Behavior During Therapy  [facility] for tasks assessed/performed       Past Medical History:  Diagnosis Date  . Arthritis   . Diabetes mellitus without complication ([place])   . GERD (gastroesophageal reflux disease)     Past Surgical History:  Procedure Laterality Date  . CATARACT EXTRACTION Right   . CATARACT EXTRACTION W/PHACO Left 12/20/2013   Procedure: CATARACT EXTRACTION PHACO AND INTRAOCULAR LENS PLACEMENT (IOC);  Surgeon: Williams Che, MD;  Location: [facility];  Service: Ophthalmology;  Laterality: Left;  CDE: 0.97    There were no vitals filed for this visit.   Subjective Assessment - 12/15/19 1350    Subjective  Patient is a 69 y.o. male who presents to physical therapy with c/o low back pain and RLE pain. Patient states pain in RLE is sharp intermittently and painful after standing 20 minutes while at work. He feels that his leg is going to give out because of pain. He has been having trouble sleeping because he normally sleeps on right and is not able to due to increase in symptoms. He has these symptoms for last 6 months mostly while at work. He notes increased symptoms with sitting and standing. He raps a strap around his R calf to help decrease symptoms. Meds have not helped much. Patient states main goal  is to decrease pain. Pain only goes into R calf/foot when standing longer periods of time.    Pertinent History  DM, arthritis.    Limitations  Sitting;Standing;Walking    How long can you stand comfortably?  20 minutes    How long can you walk comfortably?  20 minutes    Patient Stated Goals  decrease pain    Currently in Pain?  Yes    Pain Score  7    at worst   Pain Location  Back    Pain Onset  More than a month ago         [facility] PT Assessment - 12/15/19 0001      Assessment   Medical Diagnosis  LBP with R sciatica    Referring Provider (PT)  Sanjuana Kava MD    Onset Date/Surgical Date  06/17/19    Next MD Visit  May 26    Prior Therapy  Yes not for back      Precautions   Precautions  None      Restrictions   Weight Bearing Restrictions  No      Balance Screen   Has the patient fallen in the past 6 months  No    Has the patient had a decrease in activity level because of a fear of falling?  No    Is the patient reluctant to leave their home because of a fear of falling?   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      Cognition   Overall Cognitive Status  Within Functional Limits for tasks assessed      Observation/Other Assessments   Observations  Ambulates without AD    Focus on Therapeutic Outcomes (FOTO)   49% limited      Sensation   Light Touch  Impaired Detail    Additional Comments  increased sensation L4,L5, S1 on R      Posture/Postural Control   Posture/Postural Control  Postural limitations    Postural Limitations  Rounded Shoulders;Forward head    Posture Comments  slouched in chair      ROM / Strength   AROM / PROM / Strength  AROM;Strength      AROM   AROM Assessment Site  Lumbar    Lumbar Flexion  0% limited     Lumbar Extension  25% limited pain in back and R Leg to calf    Lumbar -  Right Side Bend  25% limited pain in back and leg    Lumbar - Left Side Bend  25% limited pain in back    Lumbar - Right Rotation  0% limited    Lumbar - Left Rotation  0% limited      Strength   Strength Assessment Site  Hip;Knee;Ankle    Right/Left Hip  Right;Left    Right Hip Flexion  4-/5   hip and back pain   Left Hip Flexion  4+/5    Right/Left Knee  Right;Left    Right Knee Flexion  5/5    Right Knee Extension  5/5    Left Knee Flexion  5/5    Left Knee Extension  5/5    Right/Left Ankle  Right;Left    Right Ankle Dorsiflexion  5/5    Left Ankle Dorsiflexion  5/5      Palpation   Spinal mobility  WFL T/sp and upper lumbar, hypomobile and painful lower lumbar    Palpation comment  TTP lumbar paraspinals, R glutes, R piriformis      Transfers   Five time sit to stand comments   12.47 seconds    Comments  without UE use                  Objective measurements completed on examination: See above findings.              PT Education - 12/15/19 1358    Education Details  Patient educated on exam findings, POC, scope of PT, LBP, radicular symptoms    Person(s) Educated  Patient    Methods  Explanation;Demonstration    Comprehension  Verbalized understanding;Returned demonstration       PT Short Term Goals - 12/15/19 1616      PT SHORT TERM GOAL #1   Title  Patient will be independent with HEP in order to improve functional outcomes.    Time  2    Period  Weeks    Status  New    Target Date  12/29/19      PT SHORT TERM GOAL #2   Title  Patient will report at least 25%  improvement in symptoms for improved quality of life.    Time  2    Period  Weeks    Status  New    Target Date  12/29/19        PT Long Term Goals - 12/15/19 1617      PT LONG TERM GOAL #1   Title  Patient will report at least 75% improvement in symptoms for improved quality of life.    Time  4    Period  Weeks    Status  New    Target Date  01/12/20      PT LONG  TERM GOAL #2   Title  Patient will improve FOTO score by at least 5 points in order to indicate improved tolerance to activity.    Time  4    Period  Weeks    Status  New    Target Date  01/12/20      PT LONG TERM GOAL #3   Title  Patient will be able to ambulate for at least 30 minutes with pain no greater than 1/10 in order to demonstrate improved ability to ambulate in the community.    Time  4    Period  Weeks    Status  New    Target Date  01/12/20      PT LONG TERM GOAL #4   Title  Patient will be able to complete 5x STS in under 11.4 seconds in order to reduce the risk of falls.    Time  4    Period  Weeks    Status  New    Target Date  01/12/20             Plan - 12/15/19 1601    Clinical Impression Statement  Patient is a 69 y.o. male who presents to physical therapy with c/o low back pain and R radicular pain with insidious onset about 6 months ago. He presents with pain limited deficits in lumbar and LE strength, ROM, endurance, postural impairments, spinal mobility, hyper sensation, activity tolerance and functional mobility with ADL. He is having to modify and restrict ADL as indicated by FOTO score as well as subjective information and objective measures which is affecting overall participation. Patient will benefit from skilled physical therapy in order to improve function and reduce impairment.    Personal Factors and Comorbidities  Comorbidity 2;Age;Past/Current Experience;Social Background;Time since onset of injury/illness/exacerbation    Comorbidities  DM, arthritis.    Examination-Activity Limitations  Bed Mobility;Carry;Sleep;Squat;Stairs;Stand;Locomotion Level;Transfers    Examination-Participation Restrictions  Brewing technologist;Shop;Meal Prep;Cleaning    Stability/Clinical Decision Making  Evolving/Moderate complexity    Clinical Decision Making  Moderate    Rehab Potential  Fair    PT Frequency  2x / week    PT Duration  4 weeks    PT  Treatment/Interventions  ADLs/Self Care Home Management;Electrical Stimulation;Cryotherapy;Moist Heat;Ultrasound;Therapeutic exercise;Therapeutic activities;Patient/family education;Manual techniques;Dry needling;Aquatic Therapy;Iontophoresis 4mg /ml Dexamethasone;Biofeedback;Traction;DME Instruction;Gait training;Stair training;Functional mobility training;Balance training;Neuromuscular re-education;Orthotic Fit/Training;Passive range of motion;Energy conservation;Taping;Joint Manipulations;Spinal Manipulations    PT Next Visit Plan  Begin manual therapy for pain relief and mobility with mobs/STM, begin core and LE strengthing, possibly assess balance and add balance training    PT Home Exercise Plan  initiate next session    Consulted and Agree with Plan of Care  Patient       Patient will benefit from skilled therapeutic intervention in order to improve the following deficits and impairments:  Pain, Decreased activity tolerance, Decreased range  of motion, Decreased mobility, Decreased endurance, Decreased balance, Decreased strength, Hypomobility, Increased muscle spasms, Postural dysfunction  Visit Diagnosis: Low back pain, unspecified back pain laterality, unspecified chronicity, unspecified whether sciatica present  Muscle weakness (generalized)  Other abnormalities of gait and mobility  Other symptoms and signs involving the musculoskeletal system     Problem List Patient Active Problem List   Diagnosis Date Noted  . Osteoarthritis 04/26/2019  . Hypertension associated with diabetes (HCC) 09/29/2018  . Diabetes (HCC) 08/14/2018  . Hyperlipidemia associated with type 2 diabetes mellitus (HCC) 08/14/2018  . GERD without esophagitis 08/14/2018  . BPH (benign prostatic hyperplasia) 08/14/2018  . Iron deficiency anemia 08/14/2018  . Dizzy spells 01/20/2015    4:20 PM, 12/15/19 Wyman Songster PT, DPT Physical Therapist at [facility]  [facility] [address] [place], [place], [address] Phone: [phone]   Fax:  [phone]  Name: [facility] MRN: [id number] Date of Birth: 01/08/1951

## 2019-12-17 ENCOUNTER — Other Ambulatory Visit: Payer: Self-pay

## 2019-12-17 ENCOUNTER — Encounter (HOSPITAL_COMMUNITY): Payer: Self-pay

## 2019-12-17 ENCOUNTER — Ambulatory Visit (HOSPITAL_COMMUNITY): Payer: 59

## 2019-12-17 DIAGNOSIS — M6281 Muscle weakness (generalized): Secondary | ICD-10-CM

## 2019-12-17 DIAGNOSIS — M545 Low back pain, unspecified: Secondary | ICD-10-CM

## 2019-12-17 DIAGNOSIS — R29898 Other symptoms and signs involving the musculoskeletal system: Secondary | ICD-10-CM

## 2019-12-17 DIAGNOSIS — R2689 Other abnormalities of gait and mobility: Secondary | ICD-10-CM

## 2019-12-17 NOTE — Therapy (Signed)
[place], [place], [address] Phone: [phone]   Fax:  [phone]  Physical Therapy Treatment  Patient Details  Name: Jimmy Craig MRN: [id number] Date of Birth: 1950-11-28 Referring Provider (PT): Sanjuana Kava MD   Encounter Date: 12/17/2019  PT End of Session - 12/17/19 1320    Visit Number  2    Number of Visits  8    Date for PT Re-Evaluation  01/12/20    Authorization Type  [employer] (V.L. 30 combined, no auth)    Progress Note Due on Visit  8    PT Start Time  4259    PT Stop Time  1354    PT Time Calculation (min)  41 min    Activity Tolerance  Patient tolerated treatment well    Behavior During Therapy  [facility] for tasks assessed/performed       Past Medical History:  Diagnosis Date  . Arthritis   . Diabetes mellitus without complication ([place])   . GERD (gastroesophageal reflux disease)     Past Surgical History:  Procedure Laterality Date  . CATARACT EXTRACTION Right   . CATARACT EXTRACTION W/PHACO Left 12/20/2013   Procedure: CATARACT EXTRACTION PHACO AND INTRAOCULAR LENS PLACEMENT (IOC);  Surgeon: Williams Che, MD;  Location: [facility];  Service: Ophthalmology;  Laterality: Left;  CDE: 0.97    There were no vitals filed for this visit.  Subjective Assessment - 12/17/19 1317    Subjective  Pt stated he has LBP and Rt LE pain down to foot, pain scale 5/10 especially in Rt calf and Rt knee.  Reports difficulty sleep at night due to Rt LE pain.    Pertinent History  DM, arthritis.    Patient Stated Goals  decrease pain    Currently in Pain?  Yes    Pain Score  5     Pain Location  Back   Radicular Rt LE pain to foot   Pain Orientation  Right;Lower    Pain Descriptors / Indicators  Dull    Pain Type  Chronic pain    Pain Radiating Towards  Radicular Rt LE pain to foot    Pain Onset  More than a month ago    Pain Frequency  Constant    Aggravating Factors   standing, walking    Pain  Relieving Factors  rest    Effect of Pain on Daily Activities  limits         [facility] PT Assessment - 12/17/19 0001      Strength   Right Hip Extension  3+/5    Right Hip ABduction  4-/5   pain in hip   Left Hip Extension  4/5    Left Hip ABduction  4+/5                    [facility] Adult PT Treatment/Exercise - 12/17/19 0001      Posture/Postural Control   Posture/Postural Control  Postural limitations    Postural Limitations  Rounded Shoulders;Forward head    Posture Comments  slouched in chair      Exercises   Exercises  Lumbar      Lumbar Exercises: Stretches   Active Hamstring Stretch  Right;Left;2 reps;30 seconds    Lower Trunk Rotation  5 reps;10 seconds    Prone on Elbows Stretch  Other (comment)    Prone on Elbows Stretch Limitations  2 minutes    Piriformis Stretch  2  reps;30 seconds    Piriformis Stretch Limitations  supine       Lumbar Exercises: Supine   Bridge  10 reps      Manual Therapy   Manual Therapy  Soft tissue mobilization    Manual therapy comments  Manual complete separate than rest of tx    Soft tissue mobilization  Prone position, STM to Rt glut, most tenderness Rt piriformis             PT Education - 12/17/19 1328    Education Details  Reviewed goals, educated importance of HEP compliance and established home exercise program this session.    Person(s) Educated  Patient    Methods  Explanation;Demonstration;Verbal cues;Handout    Comprehension  Verbalized understanding;Returned demonstration       PT Short Term Goals - 12/15/19 1616      PT SHORT TERM GOAL #1   Title  Patient will be independent with HEP in order to improve functional outcomes.    Time  2    Period  Weeks    Status  New    Target Date  12/29/19      PT SHORT TERM GOAL #2   Title  Patient will report at least 25% improvement in symptoms for improved quality of life.    Time  2    Period  Weeks    Status  New    Target Date  12/29/19         PT Long Term Goals - 12/15/19 1617      PT LONG TERM GOAL #1   Title  Patient will report at least 75% improvement in symptoms for improved quality of life.    Time  4    Period  Weeks    Status  New    Target Date  01/12/20      PT LONG TERM GOAL #2   Title  Patient will improve FOTO score by at least 5 points in order to indicate improved tolerance to activity.    Time  4    Period  Weeks    Status  New    Target Date  01/12/20      PT LONG TERM GOAL #3   Title  Patient will be able to ambulate for at least 30 minutes with pain no greater than 1/10 in order to demonstrate improved ability to ambulate in the community.    Time  4    Period  Weeks    Status  New    Target Date  01/12/20      PT LONG TERM GOAL #4   Title  Patient will be able to complete 5x STS in under 11.4 seconds in order to reduce the risk of falls.    Time  4    Period  Weeks    Status  New    Target Date  01/12/20            Plan - 12/17/19 1502    Clinical Impression Statement  Reviewed goals, educated importance of HEP compliance and established home exercise this session.  Session focus with spinal mobility, LE stretches and proximal strengthening to assist with LBP.  Manual STM complete with noted increased tendence with Rt piriformis.  No reports of increased pain through session.    Personal Factors and Comorbidities  Comorbidity 2;Age;Past/Current Experience;Social Background;Time since onset of injury/illness/exacerbation    Comorbidities  DM, arthritis.    Examination-Activity Limitations  Bed Mobility;Carry;Sleep;Squat;Stairs;Stand;Locomotion Level;Transfers  Examination-Participation Restrictions  [employer];Shop;Meal Prep;Cleaning    Stability/Clinical Decision Making  Evolving/Moderate complexity    Clinical Decision Making  Moderate    Rehab Potential  Fair    PT Frequency  2x / week    PT Duration  4 weeks    PT Treatment/Interventions  ADLs/Self Care Home  Management;Electrical Stimulation;Cryotherapy;Moist Heat;Ultrasound;Therapeutic exercise;Therapeutic activities;Patient/family education;Manual techniques;Dry needling;Aquatic Therapy;Iontophoresis 4mg /ml Dexamethasone;Biofeedback;Traction;DME Instruction;Gait training;Stair training;Functional mobility training;Balance training;Neuromuscular re-education;Orthotic Fit/Training;Passive range of motion;Energy conservation;Taping;Joint Manipulations;Spinal Manipulations    PT Next Visit Plan  F/U with response to manual and HEP compliance.  Begin manual therapy for pain relief and mobility with mobs/STM, begin core and LE strengthing, possibly assess balance and add balance training    PT Home Exercise Plan  5/14: bridge, LTR, hamstring and piriformis stretch, POE       Patient will benefit from skilled therapeutic intervention in order to improve the following deficits and impairments:  Pain, Decreased activity tolerance, Decreased range of motion, Decreased mobility, Decreased endurance, Decreased balance, Decreased strength, Hypomobility, Increased muscle spasms, Postural dysfunction  Visit Diagnosis: Low back pain, unspecified back pain laterality, unspecified chronicity, unspecified whether sciatica present  Muscle weakness (generalized)  Other abnormalities of gait and mobility  Other symptoms and signs involving the musculoskeletal system     Problem List Patient Active Problem List   Diagnosis Date Noted  . Osteoarthritis 04/26/2019  . Hypertension associated with diabetes (HCC) 09/29/2018  . Diabetes (HCC) 08/14/2018  . Hyperlipidemia associated with type 2 diabetes mellitus (HCC) 08/14/2018  . GERD without esophagitis 08/14/2018  . BPH (benign prostatic hyperplasia) 08/14/2018  . Iron deficiency anemia 08/14/2018  . Dizzy spells 01/20/2015   01/22/2015, LPTA/CLT; CBIS [phone]  [phone] 12/17/2019, 7:16 PM  [place] [facility] [address] [place], [place], [place] Phone: [phone]   Fax:  [phone]  Name: Unknown Schleyer MRN: Nyoka Cowden Date of Birth: 1951/07/31

## 2019-12-17 NOTE — Patient Instructions (Addendum)
Lower Trunk Rotation Stretch    Keeping back flat and feet together, rotate knees to left side. Hold 10 seconds. Repeat 5 times per set. Do 2 sets per day  http://orth.exer.us/122   Copyright  VHI. All rights reserved.   Supine    Lie on back, legs bent and feet flat. Grasp behind one leg and slowly try to straighten knee. Hold 30 seconds.  Repeat 3 times per session. Do 2 sessions per day.  Copyright  VHI. All rights reserved.   Bridging    Slowly raise buttocks from floor, keeping stomach tight. Repeat 10 times per set, hold for 5 seconds. Do 2 sets per day.  http://orth.exer.us/1096   Copyright  VHI. All rights reserved.    On Elbows (Prone)    Rise up on elbows as high as possible, keeping hips on floor. Hold 2 minutes. Repeat 2 times per set. Do 1 set per day. http://orth.exer.us/92   Copyright  VHI. All rights reserved.   Piriformis Stretch, Supine    Lie supine, one ankle crossed onto opposite knee. Holding bottom leg behind knee, gently pull legs toward chest until stretch is felt in buttock of top leg. Hold 30 seconds. For deeper stretch gently push top knee away from body.  Repeat 3 times per session. Do 2 sessions per day.  Copyright  VHI. All rights reserved.

## 2019-12-19 ENCOUNTER — Other Ambulatory Visit: Payer: Self-pay | Admitting: Family

## 2019-12-22 ENCOUNTER — Encounter (HOSPITAL_COMMUNITY): Payer: Self-pay

## 2019-12-22 ENCOUNTER — Other Ambulatory Visit: Payer: Self-pay

## 2019-12-22 ENCOUNTER — Ambulatory Visit (HOSPITAL_COMMUNITY): Payer: 59

## 2019-12-22 DIAGNOSIS — M545 Low back pain, unspecified: Secondary | ICD-10-CM

## 2019-12-22 DIAGNOSIS — R29898 Other symptoms and signs involving the musculoskeletal system: Secondary | ICD-10-CM

## 2019-12-22 DIAGNOSIS — M6281 Muscle weakness (generalized): Secondary | ICD-10-CM

## 2019-12-22 DIAGNOSIS — R2689 Other abnormalities of gait and mobility: Secondary | ICD-10-CM

## 2019-12-22 NOTE — Therapy (Signed)
[place], [place], [address] Phone: [phone]   Fax:  [phone]  Physical Therapy Treatment  Patient Details  Name: Jimmy Craig MRN: [id number] Date of Birth: [birth year] Referring Provider (PT): Sanjuana Kava MD   Encounter Date: 12/22/2019  PT End of Session - 12/22/19 1720    Visit Number  3    Number of Visits  8    Date for PT Re-Evaluation  01/12/20    Authorization Type  [employer] (V.L. 30 combined, no auth)    Progress Note Due on Visit  8    PT Start Time  1704    PT Stop Time  1745    PT Time Calculation (min)  41 min    Activity Tolerance  Patient tolerated treatment well    Behavior During Therapy  [facility] for tasks assessed/performed       Past Medical History:  Diagnosis Date  . Arthritis   . Diabetes mellitus without complication ([place])   . GERD (gastroesophageal reflux disease)     Past Surgical History:  Procedure Laterality Date  . CATARACT EXTRACTION Right   . CATARACT EXTRACTION W/PHACO Left 12/20/2013   Procedure: CATARACT EXTRACTION PHACO AND INTRAOCULAR LENS PLACEMENT (IOC);  Surgeon: Williams Che, MD;  Location: [facility];  Service: Ophthalmology;  Laterality: Left;  CDE: 0.97    There were no vitals filed for this visit.  Subjective Assessment - 12/22/19 1708    Subjective  Pt stated he has some LBP and RT LE down to ankle, pain scale 7/10 especially Rt hip, knee and calf.  Reports some relief from massage last sessoin.    Pertinent History  DM, arthritis.    Patient Stated Goals  decrease pain    Currently in Pain?  Yes    Pain Score  7     Pain Location  Back    Pain Orientation  Right;Lower    Pain Descriptors / Indicators  Aching;Dull;Sharp    Pain Type  Chronic pain    Pain Radiating Towards  Radicular Rt LE down to foot    Pain Onset  More than a month ago    Pain Frequency  Constant    Aggravating Factors   standing, walking    Pain Relieving Factors  rest          [facility] PT Assessment - 12/22/19 0001      Balance   Balance Assessed  Yes      Static Standing Balance   Static Standing - Balance Support  No upper extremity supported    Static Standing - Level of Assistance  5: Stand by assistance    Static Standing Balance -  Activities   Single Leg Stance - Right Leg;Single Leg Stance - Left Leg;Tandam Stance - Right Leg;Tandam Stance - Left Leg    Static Standing - Comment/# of Minutes  SLS Lt: 32", Rt 9"; tandem stance with LT behind x19", Rt 39" prior LOB                    [facility] Adult PT Treatment/Exercise - 12/22/19 0001      Posture/Postural Control   Posture/Postural Control  Postural limitations    Postural Limitations  Rounded Shoulders;Forward head    Posture Comments  slouched in chair      Exercises   Exercises  Lumbar      Lumbar Exercises: Stretches   Active Hamstring Stretch  Right;Left;2 reps;30 seconds  Active Hamstring Stretch Limitations  supine, hands behind knees    Lower Trunk Rotation  5 reps;10 seconds    Standing Extension  10 reps    Press Ups  5 reps;10 reps    Piriformis Stretch  2 reps;30 seconds    Piriformis Stretch Limitations  supine       Lumbar Exercises: Standing   Functional Squats  10 reps    Functional Squats Limitations  cueing for mechanics    Other Standing Lumbar Exercises  3D hip excursion    Other Standing Lumbar Exercises  balance assessed tandem and SLS      Lumbar Exercises: Seated   Sit to Stand  10 reps    Sit to Stand Limitations  no HHA      Lumbar Exercises: Supine   Bridge  10 reps    Bridge Limitations  2 sets               PT Short Term Goals - 12/15/19 1616      PT SHORT TERM GOAL #1   Title  Patient will be independent with HEP in order to improve functional outcomes.    Time  2    Period  Weeks    Status  New    Target Date  12/29/19      PT SHORT TERM GOAL #2   Title  Patient will report at least 25% improvement in symptoms for  improved quality of life.    Time  2    Period  Weeks    Status  New    Target Date  12/29/19        PT Long Term Goals - 12/15/19 1617      PT LONG TERM GOAL #1   Title  Patient will report at least 75% improvement in symptoms for improved quality of life.    Time  4    Period  Weeks    Status  New    Target Date  01/12/20      PT LONG TERM GOAL #2   Title  Patient will improve FOTO score by at least 5 points in order to indicate improved tolerance to activity.    Time  4    Period  Weeks    Status  New    Target Date  01/12/20      PT LONG TERM GOAL #3   Title  Patient will be able to ambulate for at least 30 minutes with pain no greater than 1/10 in order to demonstrate improved ability to ambulate in the community.    Time  4    Period  Weeks    Status  New    Target Date  01/12/20      PT LONG TERM GOAL #4   Title  Patient will be able to complete 5x STS in under 11.4 seconds in order to reduce the risk of falls.    Time  4    Period  Weeks    Status  New    Target Date  01/12/20            Plan - 12/22/19 1720    Clinical Impression Statement  Assessed static standing balance and reviewed mechanics with HEP, pt able to recall all exercises except for HS stretch and required cueing for foot placement with bridges.  Pt with increased difficulty with Rt LE weight bearing during tandem stance and SLS.  Session focus with spinal mobility exercises for pain  control and core/LE strengthening.  Pt with tendency to stand with increased lumbar lordosis, educated importance of good posture.  Added abdominal sets with multimodal cueing to improve activaiton and breathing.  EOS with manual soft tissue mobilization for piriformis mobility with reports of pain reduced following.    Personal Factors and Comorbidities  Comorbidity 2;Age;Past/Current Experience;Social Background;Time since onset of injury/illness/exacerbation    Comorbidities  DM, arthritis.     Examination-Activity Limitations  Bed Mobility;Carry;Sleep;Squat;Stairs;Stand;Locomotion Level;Transfers    Examination-Participation Restrictions  Brewing technologist;Shop;Meal Prep;Cleaning    Stability/Clinical Decision Making  Evolving/Moderate complexity    Clinical Decision Making  Moderate    Rehab Potential  Fair    PT Frequency  2x / week    PT Duration  4 weeks    PT Treatment/Interventions  ADLs/Self Care Home Management;Electrical Stimulation;Cryotherapy;Moist Heat;Ultrasound;Therapeutic exercise;Therapeutic activities;Patient/family education;Manual techniques;Dry needling;Aquatic Therapy;Iontophoresis 4mg /ml Dexamethasone;Biofeedback;Traction;DME Instruction;Gait training;Stair training;Functional mobility training;Balance training;Neuromuscular re-education;Orthotic Fit/Training;Passive range of motion;Energy conservation;Taping;Joint Manipulations;Spinal Manipulations    PT Next Visit Plan  Educate self mobs with tennis ball for gluteal next session.  Add core strengthening exercises.  Cotninues LE and spinal mobility exercises.  Manual therapy for pain relief and mobility with mobs/ STM.    PT Home Exercise Plan  5/14: bridge, LTR, hamstring and piriformis stretch, POE       Patient will benefit from skilled therapeutic intervention in order to improve the following deficits and impairments:  Pain, Decreased activity tolerance, Decreased range of motion, Decreased mobility, Decreased endurance, Decreased balance, Decreased strength, Hypomobility, Increased muscle spasms, Postural dysfunction  Visit Diagnosis: Low back pain, unspecified back pain laterality, unspecified chronicity, unspecified whether sciatica present  Muscle weakness (generalized)  Other abnormalities of gait and mobility  Other symptoms and signs involving the musculoskeletal system     Problem List Patient Active Problem List   Diagnosis Date Noted  . Osteoarthritis 04/26/2019  . Hypertension  associated with diabetes (HCC) 09/29/2018  . Diabetes (HCC) 08/14/2018  . Hyperlipidemia associated with type 2 diabetes mellitus (HCC) 08/14/2018  . GERD without esophagitis 08/14/2018  . BPH (benign prostatic hyperplasia) 08/14/2018  . Iron deficiency anemia 08/14/2018  . Dizzy spells 01/20/2015   01/22/2015, LPTA/CLT; CBIS [phone]  [phone] 12/22/2019, 5:53 PM  [facility] [address] [place], [place], [place] Phone: [phone]   Fax:  [phone]  Name: Jimmy Craig MRN: Nyoka Cowden Date of Birth: 05-Oct-1950

## 2019-12-24 ENCOUNTER — Other Ambulatory Visit: Payer: Self-pay

## 2019-12-24 ENCOUNTER — Ambulatory Visit (HOSPITAL_COMMUNITY): Payer: 59 | Admitting: Physical Therapy

## 2019-12-24 ENCOUNTER — Encounter (HOSPITAL_COMMUNITY): Payer: Self-pay | Admitting: Physical Therapy

## 2019-12-24 DIAGNOSIS — M545 Low back pain, unspecified: Secondary | ICD-10-CM

## 2019-12-24 DIAGNOSIS — R2689 Other abnormalities of gait and mobility: Secondary | ICD-10-CM

## 2019-12-24 DIAGNOSIS — M6281 Muscle weakness (generalized): Secondary | ICD-10-CM

## 2019-12-24 DIAGNOSIS — R29898 Other symptoms and signs involving the musculoskeletal system: Secondary | ICD-10-CM

## 2019-12-24 NOTE — Patient Instructions (Signed)
Access Code: [email or web address] URL: [email or web address] Date: 12/24/2019 Prepared by: Georges Lynch  Exercises Right Standing Lateral Shift Correction at Wall - Repetitions - 3 x daily - 7 x weekly - 2 sets - 10 reps Sidelying Hip Abduction - 2 x daily - 7 x weekly - 2 sets - 10 reps Supine March - 2 x daily - 7 x weekly - 2 sets - 10 reps

## 2019-12-24 NOTE — Therapy (Signed)
[facility] [address] [place], [place], [address] Phone: [phone]   Fax:  [phone]  Physical Therapy Treatment  Patient Details  Name: Jimmy Craig MRN: [id number] Date of Birth: October 09, 1950 Referring Provider (PT): Darreld Mclean MD   Encounter Date: 12/24/2019  PT End of Session - 12/24/19 1122    Visit Number  4    Number of Visits  8    Date for PT Re-Evaluation  01/12/20    Authorization Type  [employer] (V.L. 30 combined, no auth)    Progress Note Due on Visit  8    PT Start Time  1118    PT Stop Time  1158    PT Time Calculation (min)  40 min    Activity Tolerance  Patient tolerated treatment well;Patient limited by fatigue    Behavior During Therapy  [facility] for tasks assessed/performed       Past Medical History:  Diagnosis Date  . Arthritis   . Diabetes mellitus without complication (HCC)   . GERD (gastroesophageal reflux disease)     Past Surgical History:  Procedure Laterality Date  . CATARACT EXTRACTION Right   . CATARACT EXTRACTION W/PHACO Left 12/20/2013   Procedure: CATARACT EXTRACTION PHACO AND INTRAOCULAR LENS PLACEMENT (IOC);  Surgeon: Susa Simmonds, MD;  Location: [facility];  Service: Ophthalmology;  Laterality: Left;  CDE: 0.97    There were no vitals filed for this visit.  Subjective Assessment - 12/24/19 1120    Subjective  Patient says he cannot yet tell a significant improvement in back pain. says it is about a 6/10 today. Says pain is worse when he is standing. Still has radicular pain in RLE.    Pertinent History  DM, arthritis.    Patient Stated Goals  decrease pain    Currently in Pain?  Yes    Pain Score  6     Pain Location  Back    Pain Orientation  Right;Lower    Pain Descriptors / Indicators  Aching;Dull    Pain Radiating Towards  into RLE    Pain Onset  More than a month ago    Pain Frequency  Intermittent                        [facility]  Treatment/Exercise - 12/24/19 0001      Lumbar Exercises: Stretches   Lower Trunk Rotation  5 reps;10 seconds    Piriformis Stretch  2 reps;30 seconds    Piriformis Stretch Limitations  supine (hip flexion and adduction)      Lumbar Exercises: Standing   Other Standing Lumbar Exercises  standing RT sideglides at wall x10       Lumbar Exercises: Seated   Sit to Stand  20 reps    Sit to Stand Limitations  no UEs, arms straight out for increased core activation       Lumbar Exercises: Supine   Ab Set  10 reps;5 seconds    Bent Knee Raise  10 reps    Bridge  10 reps;5 seconds      Lumbar Exercises: Sidelying   Hip Abduction  Both;10 reps             PT Education - 12/24/19 1155    Education Details  on exercise technique, purpose of side glides for lateral shift correction and updated HEP handout    Person(s) Educated  Patient    Methods  Explanation;Handout  Comprehension  Verbalized understanding       PT Short Term Goals - 12/15/19 1616      PT SHORT TERM GOAL #1   Title  Patient will be independent with HEP in order to improve functional outcomes.    Time  2    Period  Weeks    Status  New    Target Date  12/29/19      PT SHORT TERM GOAL #2   Title  Patient will report at least 25% improvement in symptoms for improved quality of life.    Time  2    Period  Weeks    Status  New    Target Date  12/29/19        PT Long Term Goals - 12/15/19 1617      PT LONG TERM GOAL #1   Title  Patient will report at least 75% improvement in symptoms for improved quality of life.    Time  4    Period  Weeks    Status  New    Target Date  01/12/20      PT LONG TERM GOAL #2   Title  Patient will improve FOTO score by at least 5 points in order to indicate improved tolerance to activity.    Time  4    Period  Weeks    Status  New    Target Date  01/12/20      PT LONG TERM GOAL #3   Title  Patient will be able to ambulate for at least 30 minutes with pain no  greater than 1/10 in order to demonstrate improved ability to ambulate in the community.    Time  4    Period  Weeks    Status  New    Target Date  01/12/20      PT LONG TERM GOAL #4   Title  Patient will be able to complete 5x STS in under 11.4 seconds in order to reduce the risk of falls.    Time  4    Period  Weeks    Status  New    Target Date  01/12/20            Plan - 12/24/19 1203    Clinical Impression Statement  Patient with noted RT lateral shift upon observation. Added RT side glide correction exercise to address. Also progressed core strengthening and glute med strengthening for improved lateral stabilization about hip/ lumbar. Patient educate on purpose and function of each added exercise. Patient reports no increased complaint of pain with todays activity. Patient does note slight increase in muscle fatigue with added SL hip abduction and sit to stands. Patient issued updated HEP handout. Patient will continue to benefit from skilled therapy series to progress hip and core strength to reduce pain and improve LOF with ADLs.    Personal Factors and Comorbidities  Comorbidity 2;Age;Past/Current Experience;Social Background;Time since onset of injury/illness/exacerbation    Comorbidities  DM, arthritis.    Examination-Activity Limitations  Bed Mobility;Carry;Sleep;Squat;Stairs;Stand;Locomotion Level;Transfers    Examination-Participation Restrictions  Lawyer;Shop;Meal Prep;Cleaning    Stability/Clinical Decision Making  Evolving/Moderate complexity    Rehab Potential  Fair    PT Frequency  2x / week    PT Duration  4 weeks    PT Treatment/Interventions  ADLs/Self Care Home Management;Electrical Stimulation;Cryotherapy;Moist Heat;Ultrasound;Therapeutic exercise;Therapeutic activities;Patient/family education;Manual techniques;Dry needling;Aquatic Therapy;Iontophoresis 4mg /ml Dexamethasone;Biofeedback;Traction;DME Instruction;Gait training;Stair training;Functional  mobility training;Balance training;Neuromuscular re-education;Orthotic Fit/Training;Passive range of motion;Energy conservation;Taping;Joint Manipulations;Spinal Manipulations  PT Next Visit Plan  Assess response to HEP. Educate self mobs with tennis ball for gluteal next session.  Add core strengthening exercises.  Cotninues LE and spinal mobility exercises.  Manual therapy for pain relief and mobility with mobs/ STM.    PT Home Exercise Plan  5/14: bridge, LTR, hamstring and piriformis stretch, POE; 12/24/19: RT side glides at wall, ab march, SL hip abduction    Consulted and Agree with Plan of Care  Patient       Patient will benefit from skilled therapeutic intervention in order to improve the following deficits and impairments:  Pain, Decreased activity tolerance, Decreased range of motion, Decreased mobility, Decreased endurance, Decreased balance, Decreased strength, Hypomobility, Increased muscle spasms, Postural dysfunction  Visit Diagnosis: Low back pain, unspecified back pain laterality, unspecified chronicity, unspecified whether sciatica present  Muscle weakness (generalized)  Other abnormalities of gait and mobility  Other symptoms and signs involving the musculoskeletal system     Problem List Patient Active Problem List   Diagnosis Date Noted  . Osteoarthritis 04/26/2019  . Hypertension associated with diabetes (HCC) 09/29/2018  . Diabetes (HCC) 08/14/2018  . Hyperlipidemia associated with type 2 diabetes mellitus (HCC) 08/14/2018  . GERD without esophagitis 08/14/2018  . BPH (benign prostatic hyperplasia) 08/14/2018  . Iron deficiency anemia 08/14/2018  . Dizzy spells 01/20/2015   12:05 PM, 12/24/19 Georges Lynch PT DPT  Physical Therapist with [facility]  [facility]  [phone]   [facility] [address] [place], [place], [address] Phone: [phone]   Fax:  [phone]  Name: Malachi Kinzler MRN: [id number] Date of Birth: Jul 23, 1951

## 2019-12-27 ENCOUNTER — Ambulatory Visit (INDEPENDENT_AMBULATORY_CARE_PROVIDER_SITE_OTHER): Payer: 59 | Admitting: Family

## 2019-12-27 ENCOUNTER — Encounter: Payer: Self-pay | Admitting: Family

## 2019-12-27 ENCOUNTER — Other Ambulatory Visit: Payer: Self-pay

## 2019-12-27 VITALS — BP 127/78 | HR 71 | Temp 98.3°F | Ht 65.0 in | Wt 138.0 lb

## 2019-12-27 DIAGNOSIS — D509 Iron deficiency anemia, unspecified: Secondary | ICD-10-CM

## 2019-12-27 DIAGNOSIS — I10 Essential (primary) hypertension: Secondary | ICD-10-CM

## 2019-12-27 DIAGNOSIS — I152 Hypertension secondary to endocrine disorders: Secondary | ICD-10-CM

## 2019-12-27 DIAGNOSIS — E1169 Type 2 diabetes mellitus with other specified complication: Secondary | ICD-10-CM

## 2019-12-27 DIAGNOSIS — R351 Nocturia: Secondary | ICD-10-CM

## 2019-12-27 DIAGNOSIS — K219 Gastro-esophageal reflux disease without esophagitis: Secondary | ICD-10-CM | POA: Diagnosis not present

## 2019-12-27 DIAGNOSIS — E1159 Type 2 diabetes mellitus with other circulatory complications: Secondary | ICD-10-CM

## 2019-12-27 DIAGNOSIS — M159 Polyosteoarthritis, unspecified: Secondary | ICD-10-CM

## 2019-12-27 DIAGNOSIS — M8949 Other hypertrophic osteoarthropathy, multiple sites: Secondary | ICD-10-CM

## 2019-12-27 DIAGNOSIS — N401 Enlarged prostate with lower urinary tract symptoms: Secondary | ICD-10-CM

## 2019-12-27 DIAGNOSIS — E785 Hyperlipidemia, unspecified: Secondary | ICD-10-CM

## 2019-12-27 LAB — BAYER DCA HB A1C WAIVED: HB A1C (BAYER DCA - WAIVED): 8.4 % — ABNORMAL HIGH (ref <7.0)

## 2019-12-27 MED ORDER — PANTOPRAZOLE SODIUM 40 MG PO TBEC
40.0000 mg | DELAYED_RELEASE_TABLET | Freq: Every day | ORAL | 3 refills | Status: DC
Start: 2019-12-27 — End: 2021-01-08

## 2019-12-27 MED ORDER — ACCU-CHEK GUIDE VI STRP: ORAL_STRIP | 12 refills | Status: DC

## 2019-12-27 NOTE — Progress Notes (Signed)
Subjective:    Patient ID: Jimmy Craig, male    DOB: May 16, 1951, 69 y.o.   MRN: [id number]  Chief Complaint  Patient presents with  . Medical Management of Chronic Issues    diabetes   Pt presents to the office today for chronic follow up. He is followed by Ortho.  Hypertension This is a chronic problem. The current episode started more than 1 year ago. The problem has been resolved since onset. The problem is controlled. Associated symptoms include malaise/fatigue. Pertinent negatives include no anxiety, peripheral edema or shortness of breath. Risk factors for coronary artery disease include dyslipidemia, obesity, male gender and sedentary lifestyle. The current treatment provides moderate improvement. There is no history of CAD/MI, CVA or heart failure.  Gastroesophageal Reflux He complains of belching, dysphagia and heartburn. This is a chronic problem. The current episode started more than 1 year ago. The problem occurs occasionally. The problem has been waxing and waning. The symptoms are aggravated by medications. He has tried a PPI for the symptoms. The treatment provided moderate relief.  Hyperlipidemia This is a chronic problem. The current episode started more than 1 year ago. The problem is controlled. Recent lipid tests were reviewed and are normal. Pertinent negatives include no shortness of breath. Current antihyperlipidemic treatment includes statins. The current treatment provides moderate improvement of lipids. Risk factors for coronary artery disease include dyslipidemia, diabetes mellitus, male sex, hypertension and a sedentary lifestyle.  Arthritis Presents for follow-up visit. He complains of pain and stiffness. Affected locations include the left knee, right hip, right knee and right ankle (back). His pain is at a severity of 8/10 (when standing).  Benign Prostatic Hypertrophy This is a chronic problem. The current episode started more than 1 year ago. The problem has  been waxing and waning since onset. Irritative symptoms include nocturia (2-3 ).  Anemia Presents for follow-up visit. Symptoms include malaise/fatigue. There is no history of heart failure.      Review of Systems  Constitutional: Positive for malaise/fatigue.  Respiratory: Negative for shortness of breath.   Gastrointestinal: Positive for dysphagia and heartburn.  Genitourinary: Positive for nocturia (2-3 ).  Musculoskeletal: Positive for arthritis and stiffness.  All other systems reviewed and are negative.      Objective:   Physical Exam Vitals reviewed.  Constitutional:      General: He is not in acute distress.    Appearance: He is well-developed.  HENT:     Head: Normocephalic.     Right Ear: Tympanic membrane normal.     Left Ear: Tympanic membrane normal.  Eyes:     General:        Right eye: No discharge.        Left eye: No discharge.     Pupils: Pupils are equal, round, and reactive to light.  Neck:     Thyroid: No thyromegaly.  Cardiovascular:     Rate and Rhythm: Normal rate and regular rhythm.     Heart sounds: Normal heart sounds. No murmur.  Pulmonary:     Effort: Pulmonary effort is normal. No respiratory distress.     Breath sounds: Normal breath sounds. No wheezing.  Abdominal:     General: Bowel sounds are normal. There is no distension.     Palpations: Abdomen is soft.     Tenderness: There is no abdominal tenderness.  Musculoskeletal:        General: No tenderness. Normal range of motion.     Cervical back: Normal range  of motion and neck supple.  Skin:    General: Skin is warm and dry.     Findings: No erythema or rash.  Neurological:     Mental Status: He is alert and oriented to person, place, and time.     Cranial Nerves: No cranial nerve deficit.     Deep Tendon Reflexes: Reflexes are normal and symmetric.  Psychiatric:        Behavior: Behavior normal.        Thought Content: Thought content normal.        Judgment: Judgment normal.        BP 127/78   Pulse 71   Temp 98.3 F (36.8 C) (Temporal)   Ht 5' 5" (1.651 m)   Wt 138 lb (62.6 kg)   SpO2 97%   BMI 22.96 kg/m      Assessment & Plan:  Jimmy Craig comes in today with chief complaint of Medical Management of Chronic Issues (diabetes)   Diagnosis and orders addressed:  1. Type 2 diabetes mellitus with other specified complication, without long-term current use of insulin ([place]) Pt will make appt with Almyra Free. He does report he has had a poor diet recently and will restart his diet - Bayer DCA Hb A1c Waived - glucose blood (ACCU-CHEK GUIDE) test strip; Use 1 strip up to 4 times daily.  E10.9, E11.9  Dispense: 100 each; Refill: 12 - CMP14+EGFR - CBC with Differential/Platelet  2. Hypertension associated with diabetes ([place]) - CMP14+EGFR - CBC with Differential/Platelet  3. GERD without esophagitis - CMP14+EGFR - CBC with Differential/Platelet  4. Hyperlipidemia associated with type 2 diabetes mellitus (HCC) - CMP14+EGFR - CBC with Differential/Platelet  5. Primary osteoarthritis involving multiple joints - CMP14+EGFR - CBC with Differential/Platelet  6. Benign prostatic hyperplasia with nocturia - CMP14+EGFR - CBC with Differential/Platelet  7. Iron deficiency anemia, unspecified iron deficiency anemia type - CMP14+EGFR - CBC with Differential/Platelet   Labs pending Health Maintenance reviewed Diet and exercise encouraged  Follow up plan: 2 months    Evelina Dun, FNP

## 2019-12-27 NOTE — Patient Instructions (Signed)
Dysphagia  Dysphagia is trouble swallowing. This condition occurs when solids and liquids stick in a person's throat on the way down to the stomach, or when food takes longer to get to the stomach than usual. You may have problems swallowing food, liquids, or both. You may also have pain while trying to swallow. It may take you more time and effort to swallow something. What are the causes? This condition may be caused by:  Muscle problems. They may make it difficult for you to move food and liquids through the esophagus, which is the tube that connects your mouth to your stomach.  Blockages. You may have ulcers, scar tissue, or inflammation that blocks the normal passage of food and liquids. Causes of these problems include: ? Acid reflux from your stomach into your esophagus (gastroesophageal reflux). ? Infections. ? Radiation treatment for cancer. ? Medicines taken without enough fluids to wash them down into your stomach.  Stroke. This can affect the nerves and make it difficult to swallow.  Nerve problems. These prevent signals from being sent to the muscles of your esophagus to squeeze (contract) and move what you swallow down to your stomach.  Globus pharyngeus. This is a common problem that involves a feeling like something is stuck in your throat or a sense of trouble with swallowing, even though nothing is wrong with the swallowing passages.  Certain conditions, such as cerebral palsy or Parkinson's disease. What are the signs or symptoms? Common symptoms of this condition include:  A feeling that solids or liquids are stuck in your throat on the way down to the stomach.  Pain while swallowing.  Coughing or gagging while trying to swallow. Other symptoms include:  Food moving back from your stomach to your mouth (regurgitation).  Noises coming from your throat.  Chest discomfort with swallowing.  A feeling of fullness when swallowing.  Drooling, especially when the  throat is blocked.  Heartburn. How is this diagnosed? This condition may be diagnosed by:  Barium X-ray. In this test, you will swallow a white liquid that sticks to the inside of your esophagus. X-ray images are then taken.  Endoscopy. In this test, a flexible telescope is inserted down your throat to look at your esophagus and your stomach.  CT scans and an MRI. How is this treated? Treatment for dysphagia depends on the cause of this condition, such as:  If the dysphagia is caused by acid reflux or infection, medicines may be used. They may include antibiotics and heartburn medicines.  If the dysphagia is caused by problems with the muscles, swallowing therapy may be used to help you strengthen your swallowing muscles. You may have to do specific exercises to strengthen the muscles or stretch them.  If the dysphagia is caused by a blockage or mass, procedures to remove the blockage may be done. You may need surgery and a feeding tube. You may need to make diet changes. Ask your health care provider for specific instructions. Follow these instructions at home: Medicines  Take over-the-counter and prescription medicines only as told by your health care provider.  If you were prescribed an antibiotic medicine, take it as told by your health care provider. Do not stop taking the antibiotic even if you start to feel better. Eating and drinking   Follow any diet changes as told by your health care provider.  Work with a diet and nutrition specialist (dietitian) to create an eating plan that will help you get the nutrients you need in   order to stay healthy.  Eat soft foods that are easier to swallow.  Cut your food into small pieces and eat slowly. Take small bites.  Eat and drink only when you are sitting upright.  Do not drink alcohol or caffeine. If you need help quitting, ask your health care provider. General instructions  Check your weight every day to make sure you are  not losing weight.  Do not use any products that contain nicotine or tobacco, such as cigarettes, e-cigarettes, and chewing tobacco. If you need help quitting, ask your health care provider.  Keep all follow-up visits as told by your health care provider. This is important. Contact a health care provider if you:  Lose weight because you cannot swallow.  Cough when you drink liquids.  Cough up partially digested food. Get help right away if you:  Cannot swallow your saliva.  Have shortness of breath, a fever, or both.  Have a hoarse voice and also have trouble swallowing. Summary  Dysphagia is trouble swallowing. This condition occurs when solids and liquids stick in a person's throat on the way down to the stomach. You may cough or gag while trying to swallow.  Dysphagia has many possible causes.  Treatment for dysphagia depends on the cause of the condition.  Keep all follow-up visits as told by your health care provider. This is important. This information is not intended to replace advice given to you by your health care provider. Make sure you discuss any questions you have with your health care provider. Document Revised: 12/16/2018 Document Reviewed: 12/16/2018 Elsevier Patient Education  2020 [employer]  

## 2019-12-28 ENCOUNTER — Ambulatory Visit (HOSPITAL_COMMUNITY): Payer: 59 | Admitting: Physical Therapy

## 2019-12-28 DIAGNOSIS — R2689 Other abnormalities of gait and mobility: Secondary | ICD-10-CM

## 2019-12-28 DIAGNOSIS — M6281 Muscle weakness (generalized): Secondary | ICD-10-CM

## 2019-12-28 DIAGNOSIS — M545 Low back pain, unspecified: Secondary | ICD-10-CM

## 2019-12-28 LAB — CBC WITH DIFFERENTIAL/PLATELET
Basophils Absolute: 0.1 10*3/uL (ref 0.0–0.2)
Basos: 1 %
EOS (ABSOLUTE): 0.2 10*3/uL (ref 0.0–0.4)
Eos: 3 %
Hematocrit: 37.7 % (ref 37.5–51.0)
Hemoglobin: 12.1 g/dL — ABNORMAL LOW (ref 13.0–17.7)
Immature Grans (Abs): 0 10*3/uL (ref 0.0–0.1)
Immature Granulocytes: 1 %
Lymphocytes Absolute: 1.8 10*3/uL (ref 0.7–3.1)
Lymphs: 32 %
MCH: 26.5 pg — ABNORMAL LOW (ref 26.6–33.0)
MCHC: 32.1 g/dL (ref 31.5–35.7)
MCV: 83 fL (ref 79–97)
Monocytes Absolute: 0.5 10*3/uL (ref 0.1–0.9)
Monocytes: 9 %
Neutrophils Absolute: 3 10*3/uL (ref 1.4–7.0)
Neutrophils: 54 %
Platelets: 300 10*3/uL (ref 150–450)
RBC: 4.56 x10E6/uL (ref 4.14–5.80)
RDW: 15.2 % (ref 11.6–15.4)
WBC: 5.5 10*3/uL (ref 3.4–10.8)

## 2019-12-28 LAB — CMP14+EGFR
ALT: 23 IU/L (ref 0–44)
AST: 21 IU/L (ref 0–40)
Albumin/Globulin Ratio: 1.6 (ref 1.2–2.2)
Albumin: 4.4 g/dL (ref 3.8–4.8)
Alkaline Phosphatase: 67 IU/L (ref 48–121)
BUN/Creatinine Ratio: 19 (ref 10–24)
BUN: 18 mg/dL (ref 8–27)
Bilirubin Total: <0.2 mg/dL (ref 0.0–1.2)
CO2: 24 mmol/L (ref 20–29)
Calcium: 9.4 mg/dL (ref 8.6–10.2)
Chloride: 102 mmol/L (ref 96–106)
Creatinine, Ser: 0.95 mg/dL (ref 0.76–1.27)
GFR calc Af Amer: 96 mL/min/{1.73_m2} (ref >59)
GFR calc non Af Amer: 83 mL/min/{1.73_m2} (ref >59)
Globulin, Total: 2.8 g/dL (ref 1.5–4.5)
Glucose: 110 mg/dL — ABNORMAL HIGH (ref 65–99)
Potassium: 4.2 mmol/L (ref 3.5–5.2)
Sodium: 139 mmol/L (ref 134–144)
Total Protein: 7.2 g/dL (ref 6.0–8.5)

## 2019-12-28 NOTE — Therapy (Signed)
[facility] [address] [place], [place], [address] Phone: [phone]   Fax:  [phone]  Physical Therapy Treatment  Patient Details  Name: Jimmy Craig MRN: [id number] Date of Birth: 01-15-1951 Referring Provider (PT): Darreld Mclean MD   Encounter Date: 12/28/2019  PT End of Session - 12/28/19 1313    Visit Number  5    Number of Visits  8    Date for PT Re-Evaluation  01/12/20    Authorization Type  [employer] (V.L. 30 combined, no auth)    Progress Note Due on Visit  8    PT Start Time  1135    PT Stop Time  1215    PT Time Calculation (min)  40 min    Activity Tolerance  Patient tolerated treatment well;Patient limited by fatigue    Behavior During Therapy  [facility] for tasks assessed/performed       Past Medical History:  Diagnosis Date  . Arthritis   . Diabetes mellitus without complication (HCC)   . GERD (gastroesophageal reflux disease)     Past Surgical History:  Procedure Laterality Date  . CATARACT EXTRACTION Right   . CATARACT EXTRACTION W/PHACO Left 12/20/2013   Procedure: CATARACT EXTRACTION PHACO AND INTRAOCULAR LENS PLACEMENT (IOC);  Surgeon: Susa Simmonds, MD;  Location: [facility];  Service: Ophthalmology;  Laterality: Left;  CDE: 0.97    There were no vitals filed for this visit.  Subjective Assessment - 12/28/19 1138    Subjective  pt states his pain is the same, no worse or better.  No pin point pain in glute or LB just radiating pain down Rt LE.    Currently in Pain?  Yes    Pain Score  6     Pain Location  Leg    Pain Orientation  Right    Pain Descriptors / Indicators  Radiating;Aching                        [facility] Adult PT Treatment/Exercise - 12/28/19 0001      Lumbar Exercises: Stretches   Active Hamstring Stretch  Right;Left;2 reps;30 seconds    Active Hamstring Stretch Limitations  supine, hands behind knees    Lower Trunk Rotation  5 reps;10 seconds    Press Ups  5  reps;10 reps    Piriformis Stretch  2 reps;30 seconds    Piriformis Stretch Limitations  supine (hip flexion and adduction)    Other Lumbar Stretch Exercise  also shown piriformis stretch in seated position      Lumbar Exercises: Standing   Functional Squats  10 reps    Other Standing Lumbar Exercises  3D hip excursion 10 reps each      Lumbar Exercises: Seated   Sit to Stand  20 reps    Sit to Stand Limitations  no UEs, arms straight out for increased core activation       Lumbar Exercises: Supine   Ab Set  10 reps;5 seconds    Bent Knee Raise  10 reps;Limitations    Bent Knee Raise Limitations  2 sets    Bridge  10 reps;Other (comment)    Bridge Limitations  2 sets      Lumbar Exercises: Sidelying   Hip Abduction  10 reps;Both;Limitations    Hip Abduction Weights (lbs)  2 sets               PT Short Term Goals - 12/15/19 1616  PT SHORT TERM GOAL #1   Title  Patient will be independent with HEP in order to improve functional outcomes.    Time  2    Period  Weeks    Status  New    Target Date  12/29/19      PT SHORT TERM GOAL #2   Title  Patient will report at least 25% improvement in symptoms for improved quality of life.    Time  2    Period  Weeks    Status  New    Target Date  12/29/19        PT Long Term Goals - 12/15/19 1617      PT LONG TERM GOAL #1   Title  Patient will report at least 75% improvement in symptoms for improved quality of life.    Time  4    Period  Weeks    Status  New    Target Date  01/12/20      PT LONG TERM GOAL #2   Title  Patient will improve FOTO score by at least 5 points in order to indicate improved tolerance to activity.    Time  4    Period  Weeks    Status  New    Target Date  01/12/20      PT LONG TERM GOAL #3   Title  Patient will be able to ambulate for at least 30 minutes with pain no greater than 1/10 in order to demonstrate improved ability to ambulate in the community.    Time  4    Period  Weeks     Status  New    Target Date  01/12/20      PT LONG TERM GOAL #4   Title  Patient will be able to complete 5x STS in under 11.4 seconds in order to reduce the risk of falls.    Time  4    Period  Weeks    Status  New    Target Date  01/12/20            Plan - 12/28/19 1314    Clinical Impression Statement  Continued with established therex and added seated piriformis stretch this session.  Pt requires cues  complete all exercises slowly and controlled.  Educated on reason why to complete exercises sslowly and stretches are to be held longer.  Pt does not have any lateral shifts this session wihtout need for correction.  Able to complete all exericises without complaints or increased pain.    Personal Factors and Comorbidities  Comorbidity 2;Age;Past/Current Experience;Social Background;Time since onset of injury/illness/exacerbation    Comorbidities  DM, arthritis.    Examination-Activity Limitations  Bed Mobility;Carry;Sleep;Squat;Stairs;Stand;Locomotion Level;Transfers    Examination-Participation Restrictions  Lawyer;Shop;Meal Prep;Cleaning    Stability/Clinical Decision Making  Evolving/Moderate complexity    Rehab Potential  Fair    PT Frequency  2x / week    PT Duration  4 weeks    PT Treatment/Interventions  ADLs/Self Care Home Management;Electrical Stimulation;Cryotherapy;Moist Heat;Ultrasound;Therapeutic exercise;Therapeutic activities;Patient/family education;Manual techniques;Dry needling;Aquatic Therapy;Iontophoresis 4mg /ml Dexamethasone;Biofeedback;Traction;DME Instruction;Gait training;Stair training;Functional mobility training;Balance training;Neuromuscular re-education;Orthotic Fit/Training;Passive range of motion;Energy conservation;Taping;Joint Manipulations;Spinal Manipulations    PT Next Visit Plan  Educate self mobs with tennis ball for gluteal next session.  progress core strengthening exercises.    PT Home Exercise Plan  5/14: bridge, LTR, hamstring  and piriformis stretch, POE; 12/24/19: RT side glides at wall, ab march, SL hip abduction    Consulted and Agree  with Plan of Care  Patient       Patient will benefit from skilled therapeutic intervention in order to improve the following deficits and impairments:  Pain, Decreased activity tolerance, Decreased range of motion, Decreased mobility, Decreased endurance, Decreased balance, Decreased strength, Hypomobility, Increased muscle spasms, Postural dysfunction  Visit Diagnosis: Low back pain, unspecified back pain laterality, unspecified chronicity, unspecified whether sciatica present  Other abnormalities of gait and mobility  Muscle weakness (generalized)     Problem List Patient Active Problem List   Diagnosis Date Noted  . Osteoarthritis 04/26/2019  . Hypertension associated with diabetes (HCC) 09/29/2018  . Diabetes (HCC) 08/14/2018  . Hyperlipidemia associated with type 2 diabetes mellitus (HCC) 08/14/2018  . GERD without esophagitis 08/14/2018  . BPH (benign prostatic hyperplasia) 08/14/2018  . Iron deficiency anemia 08/14/2018  . Dizzy spells 01/20/2015   Lurena Nida, PTA/CLT [phone]  Lurena Nida 12/28/2019, 1:17 PM  [facility] [address] [place], [place], [address] Phone: [phone]   Fax:  [phone]  Name: Jimmy Craig MRN: [id number] Date of Birth: November 21, 1950

## 2019-12-29 ENCOUNTER — Encounter (HOSPITAL_COMMUNITY): Payer: Self-pay | Admitting: Physical Therapy

## 2019-12-29 ENCOUNTER — Encounter: Payer: Self-pay | Admitting: Orthopaedic Surgery

## 2019-12-29 ENCOUNTER — Ambulatory Visit (INDEPENDENT_AMBULATORY_CARE_PROVIDER_SITE_OTHER): Payer: 59 | Admitting: Orthopaedic Surgery

## 2019-12-29 ENCOUNTER — Other Ambulatory Visit: Payer: Self-pay

## 2019-12-29 VITALS — Ht 65.0 in | Wt 138.0 lb

## 2019-12-29 DIAGNOSIS — M5441 Lumbago with sciatica, right side: Secondary | ICD-10-CM

## 2019-12-29 DIAGNOSIS — G8929 Other chronic pain: Secondary | ICD-10-CM | POA: Diagnosis not present

## 2019-12-29 NOTE — Progress Notes (Signed)
Patient Jimmy Craig, male DOB:29-Jul-1951, 70 y.o. PJK:[id number]  Chief Complaint  Patient presents with  . Back Pain    No change/feeling pain down entire R side/don't feel like PT is helping    HPI  Jimmy Craig is a 69 y.o. male who has worsening of lower back pain and right sided sciatica.  He has been to PT and is not any better, maybe a little worse.  He has been taking his medicine with no help.  I got the MRI report from [facility] done 07-02-2017.  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  He was advised to see neurosurgeon, Dr. Arnoldo Morale.  The patient said he was never told this.  I have read the report to him.  I would like for him to have a new MRI of the lumbar spine.  He will most likely need to see the neurosurgeon.  This explains why he is not improving.  He appears to understand.    Body mass index is 22.96 kg/m.  ROS  Review of Systems  Musculoskeletal: Positive for arthralgias and neck pain.  All other systems reviewed and are negative.   All other systems reviewed and are negative.  The following is a summary of the past history medically, past history surgically, known current medicines, social history and family history.  This information is gathered electronically by the computer from prior information and documentation.  I review this each visit and have found including this information at this point in the chart is beneficial and informative.    Past Medical History:  Diagnosis Date  . Arthritis   . Diabetes mellitus without complication ([place])   . GERD (gastroesophageal reflux disease)     Past Surgical History:  Procedure Laterality Date  . CATARACT EXTRACTION Right   . CATARACT EXTRACTION W/PHACO Left 12/20/2013   Procedure: CATARACT EXTRACTION PHACO AND INTRAOCULAR LENS PLACEMENT (IOC);  Surgeon: Williams Che, MD;  Location: [facility];  Service: Ophthalmology;  Laterality: Left;  CDE: 0.97    Family History  Problem  Relation Age of Onset  . Diabetes Mother     Social History Social History   Tobacco Use  . Smoking status: Never Smoker  . Smokeless tobacco: Never Used  Substance Use Topics  . Alcohol use: No  . Drug use: No    No Known Allergies  Current Outpatient Medications  Medication Sig Dispense Refill  . aspirin EC 81 MG tablet Take 81 mg by mouth daily.    Marland Kitchen atorvastatin (LIPITOR) 40 MG tablet Take 1 tablet (40 mg total) by mouth daily. (Needs to be seen before next refill) 30 tablet 0  . blood glucose meter kit and supplies Dispense based on patient and insurance preference. Use up to four times daily as directed. (FOR ICD-10 E10.9, E11.9). 1 each 0  . cetirizine (ZYRTEC) 10 MG tablet Take 1 tablet (10 mg total) by mouth daily. 90 tablet 2  . diclofenac (VOLTAREN) 75 MG EC tablet Take 1 tablet (75 mg total) by mouth 2 (two) times daily with a meal. 60 tablet 2  . Ertugliflozin L-PyroglutamicAc (STEGLATRO) 15 MG TABS Take 1 tablet by mouth daily. (Needs to be seen before next refill) 30 tablet 0  . fluticasone (FLONASE) 50 MCG/ACT nasal spray Place 2 sprays into both nostrils 2 (two) times daily. 11.1 g 1  . gabapentin (NEURONTIN) 100 MG capsule Take 1 capsule (100 mg total) by mouth 3 (three) times daily. 90 capsule 3  .  glimepiride (AMARYL) 2 MG tablet Take 1 tablet by mouth once daily with breakfast 90 tablet 0  . glucose blood (ACCU-CHEK GUIDE) test strip Use 1 strip up to 4 times daily.  E10.9, E11.9 100 each 12  . insulin detemir (LEVEMIR) 100 UNIT/ML injection Inject 0.45 mLs (45 Units total) into the skin at bedtime. 10 mL 11  . Insulin Pen Needle (PEN NEEDLES) 32G X 4 MM MISC 1 application by Does not apply route daily. 100 each 11  . Lancets (ONETOUCH DELICA PLUS [id number]) MISC Apply 1 each topically 4 (four) times daily.    Marland Kitchen lisinopril (ZESTRIL) 10 MG tablet Take 1 tablet (10 mg total) by mouth daily. 90 tablet 3  . metFORMIN (GLUCOPHAGE) 1000 MG tablet Take 1 tablet (1,000  mg total) by mouth 2 (two) times daily with a meal. 180 tablet 3  . Multiple Vitamin (MULTIVITAMIN WITH MINERALS) TABS tablet Take 1 tablet by mouth daily.    . pantoprazole (PROTONIX) 40 MG tablet Take 1 tablet (40 mg total) by mouth daily. 90 tablet 3  . tamsulosin (FLOMAX) 0.4 MG CAPS capsule Take 1 capsule by mouth once daily 90 capsule 1  . TRULICITY 1.5 ZN/3.5AP SOPN INJECT CONTENTS OF ONE PEN INTO THE SKIN ONCE A WEEK 12 mL 0   No current facility-administered medications for this visit.     Physical Exam  Height '5\' 5"'$  (1.651 m), weight 138 lb (62.6 kg).  Constitutional: overall normal hygiene, normal nutrition, well developed, normal grooming, normal body habitus. Assistive device:none  Musculoskeletal: gait and station Limp none, muscle tone and strength are normal, no tremors or atrophy is present.  .  Neurological: coordination overall normal.  Deep tendon reflex/nerve stretch intact.  Sensation normal.  Cranial nerves II-XII intact.   Skin:   Normal overall no scars, lesions, ulcers or rashes. No psoriasis.  Psychiatric: Alert and oriented x 3.  Recent memory intact, remote memory unclear.  Normal mood and affect. Well groomed.  Good eye contact.  Cardiovascular: overall no swelling, no varicosities, no edema bilaterally, normal temperatures of the legs and arms, no clubbing, cyanosis and good capillary refill.  Lymphatic: palpation is normal.  He has pain of the lower back with right sided paresthesias, muscle tone and strength are normal, ROM decreased secondary to pain and positive SLR right 30 degrees.    All other systems reviewed and are negative   The patient has been educated about the nature of the problem(s) and counseled on treatment options.  The patient appeared to understand what I have discussed and is in agreement with it.  Encounter Diagnosis  Name Primary?  . Chronic midline low back pain with right-sided sciatica Yes    PLAN Call if any  problems.  Precautions discussed.  Continue current medications.   Return to clinic 2 weeks   Get MRI of lumbar spine  Electronically Signed Sanjuana Kava, MD 5/26/202110:59 AM

## 2019-12-29 NOTE — Therapy (Signed)
[place], [place], [address] Phone: [phone]   Fax:  [phone]  Patient Details  Name: Jimmy Craig MRN: [id number] Date of Birth: [birth year] Referring Provider:  No ref. provider found  Encounter Date: 12/29/2019   PHYSICAL THERAPY DISCHARGE SUMMARY  Visits from Start of Care: 5  Current functional level related to goals / functional outcomes: No significant change since initial evaluation reported   Remaining deficits: Limited by ongoing RLE radicular pain    Education / Equipment: Patient requests DC per MD at this time.  Plan: Patient agrees to discharge.  Patient goals were not met. Patient is being discharged due to the physician's request.  ?????        5:25 PM, 12/29/19 Josue Hector PT DPT  Physical Therapist with [facility]  [phone] [place], [place], [address] Phone: [phone]   Fax:  [phone]

## 2019-12-30 ENCOUNTER — Encounter (HOSPITAL_COMMUNITY): Payer: 59 | Admitting: Physical Therapy

## 2020-01-01 ENCOUNTER — Other Ambulatory Visit: Payer: Self-pay | Admitting: Family

## 2020-01-01 DIAGNOSIS — M79604 Pain in right leg: Secondary | ICD-10-CM

## 2020-01-01 DIAGNOSIS — E1169 Type 2 diabetes mellitus with other specified complication: Secondary | ICD-10-CM

## 2020-01-05 ENCOUNTER — Encounter (HOSPITAL_COMMUNITY): Payer: 59 | Admitting: Physical Therapy

## 2020-01-07 ENCOUNTER — Encounter (HOSPITAL_COMMUNITY): Payer: 59

## 2020-01-10 ENCOUNTER — Telehealth: Payer: Self-pay | Admitting: *Deleted

## 2020-01-10 NOTE — Telephone Encounter (Signed)
Pt returning a call to the nurse

## 2020-01-12 ENCOUNTER — Encounter: Payer: Self-pay | Admitting: Family Medicine

## 2020-01-12 ENCOUNTER — Ambulatory Visit: Payer: 59 | Admitting: Orthopaedic Surgery

## 2020-01-12 ENCOUNTER — Ambulatory Visit (INDEPENDENT_AMBULATORY_CARE_PROVIDER_SITE_OTHER): Payer: 59 | Admitting: Family Medicine

## 2020-01-12 ENCOUNTER — Telehealth: Payer: Self-pay | Admitting: *Deleted

## 2020-01-12 DIAGNOSIS — E1140 Type 2 diabetes mellitus with diabetic neuropathy, unspecified: Secondary | ICD-10-CM

## 2020-01-12 DIAGNOSIS — M48062 Spinal stenosis, lumbar region with neurogenic claudication: Secondary | ICD-10-CM

## 2020-01-12 DIAGNOSIS — Z794 Long term (current) use of insulin: Secondary | ICD-10-CM | POA: Diagnosis not present

## 2020-01-12 DIAGNOSIS — G9519 Other vascular myelopathies: Secondary | ICD-10-CM

## 2020-01-12 MED ORDER — PREDNISONE 10 MG PO TABS
ORAL_TABLET | ORAL | 0 refills | Status: DC
Start: 2020-01-12 — End: 2020-02-25

## 2020-01-12 NOTE — Telephone Encounter (Signed)
Complaining with numbness in legs last night causing him not to be able to work.  Appointment for phone call visit scheduled.

## 2020-01-12 NOTE — Progress Notes (Signed)
Subjective:    Patient ID: Jimmy Craig, male    DOB: 1951/07/11, 69 y.o.   MRN: [id number]   HPI: Jimmy Craig is a 69 y.o. male presenting for standing a long time. Right leg started having pain. Saw specialist. Has MRI scheduled for 6/17. Last night walked 12 minutes and started feeling numbness in the legs from knee down. Sat for 1/2 hour. Then walked back 12 more minutes and sx returned. Could not walk. Had to sit to avoid falling after 15 min was able to walk to his car. Also noticed that his urine color has been reddish. Denies dysuria. Onset 2-3 weeks ago occurring 3-4 times. Saw Dr. Hilda Lias for this. He feels this is lumbar in origin.   Depression screen [facility] 2/9 12/27/2019 08/30/2019 06/29/2019 04/26/2019 03/19/2019  Decreased Interest 0 0 0 0 0  Down, Depressed, Hopeless 0 0 0 0 0  PHQ - 2 Score 0 0 0 0 0     Relevant past medical, surgical, family and social history reviewed and updated as indicated.  Interim medical history since our last visit reviewed. Allergies and medications reviewed and updated.  ROS:  Review of Systems  Constitutional: Negative for chills, diaphoresis and fever.  HENT: Negative for sore throat.   Respiratory: Negative for shortness of breath.   Musculoskeletal: Positive for arthralgias, back pain, gait problem and myalgias. Negative for neck pain.  Skin: Negative for rash.  Neurological: Positive for numbness. Negative for weakness.     Social History   Tobacco Use  Smoking Status Never Smoker  Smokeless Tobacco Never Used       Objective:     Wt Readings from Last 3 Encounters:  12/29/19 138 lb (62.6 kg)  12/27/19 138 lb (62.6 kg)  12/07/19 136 lb (61.7 kg)     Exam deferred. Pt. Harboring due to COVID 19. Phone visit performed.   Assessment & Plan:   1. Neurogenic claudication   2. Type 2 diabetes mellitus with diabetic neuropathy, with long-term current use of insulin (HCC)     Meds ordered this encounter   Medications  . predniSONE (DELTASONE) 10 MG tablet    Sig: Take 5 daily for 2 days followed by 4,3,2 and 1 for 2 days each.    Dispense:  30 tablet    Refill:  0    No orders of the defined types were placed in this encounter.     Diagnoses and all orders for this visit:  Neurogenic claudication  Type 2 diabetes mellitus with diabetic neuropathy, with long-term current use of insulin (HCC)  Other orders -     predniSONE (DELTASONE) 10 MG tablet; Take 5 daily for 2 days followed by 4,3,2 and 1 for 2 days each.  We discussed the possibility of his driving his blood sugar higher.  He will let us know if it starts going over 300.  Otherwise it should come back down nicely once he finishes the short course of the medicine.  I do believe this represents some neurogenic claudication which is likely secondary to the lumbar radiculopathy and sciatica described in Dr. Sanjuan Dame note.  Virtual Visit via telephone Note  I discussed the limitations, risks, security and privacy concerns of performing an evaluation and management service by telephone and the availability of in person appointments. The patient was identified with two identifiers. Pt.expressed understanding and agreed to proceed. Pt. Is at home. Dr. Darlyn Read is in his office.  Follow Up Instructions:   I discussed  the assessment and treatment plan with the patient. The patient was provided an opportunity to ask questions and all were answered. The patient agreed with the plan and demonstrated an understanding of the instructions.   The patient was advised to call back or seek an in-person evaluation if the symptoms worsen or if the condition fails to improve as anticipated.   Total minutes including chart review and phone contact time: 14   Follow up plan: Return if symptoms worsen or fail to improve and as previously arranged with Dr. Luna Glasgow and the MRI.  Claretta Fraise, MD [place] [place]

## 2020-01-12 NOTE — Telephone Encounter (Signed)
Called patient on labs no answer will mail

## 2020-01-20 ENCOUNTER — Other Ambulatory Visit: Payer: Self-pay

## 2020-01-20 ENCOUNTER — Ambulatory Visit (HOSPITAL_COMMUNITY)
Admission: RE | Admit: 2020-01-20 | Discharge: 2020-01-20 | Disposition: A | Discharge status: Home / Self Care | Payer: 59 | Source: Ambulatory Visit | Attending: Orthopaedic Surgery | Admitting: Orthopaedic Surgery

## 2020-01-20 ENCOUNTER — Ambulatory Visit (HOSPITAL_COMMUNITY): Payer: 59

## 2020-01-20 DIAGNOSIS — G8929 Other chronic pain: Secondary | ICD-10-CM

## 2020-01-20 DIAGNOSIS — M5441 Lumbago with sciatica, right side: Secondary | ICD-10-CM | POA: Diagnosis present

## 2020-01-20 IMAGING — MR MR LUMBAR SPINE W/O CM
4 of 5 series · 12 of 48 positions shown · non-contrast
Comparison: Lumbar radiographs [DATE].

CLINICAL DATA: 69-year-old male with low back pain radiating down
both legs for 3 months greater on the right. No known injury.

EXAM:
MRI LUMBAR SPINE WITHOUT CONTRAST
TECHNIQUE: Multiplanar, multisequence MR imaging of the lumbar spine was
performed. No intravenous contrast was administered.

[Series 4: T2 · sagittal · 4.0mm · 0.39mm/px · 3 of 18 slices shown (1 of 3)]
[im 4/18]
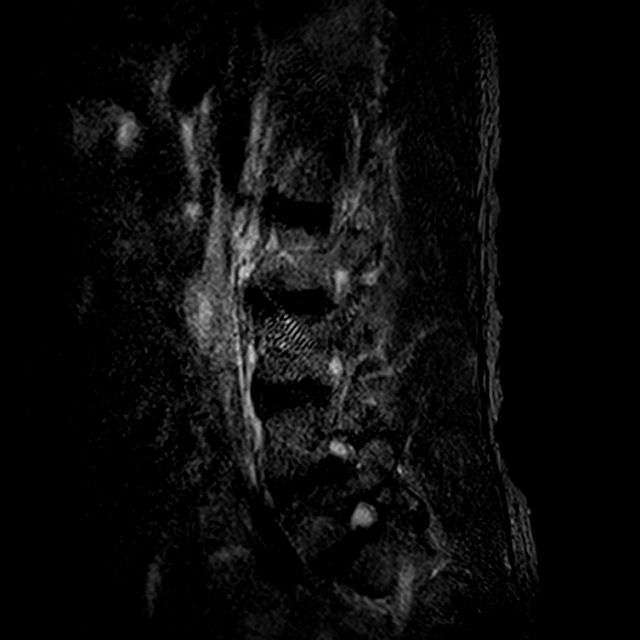
[im 11/18]
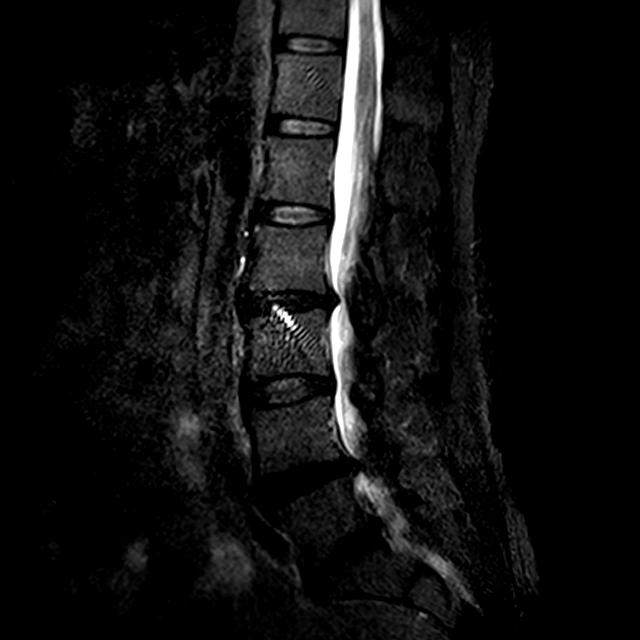
[im 18/18]
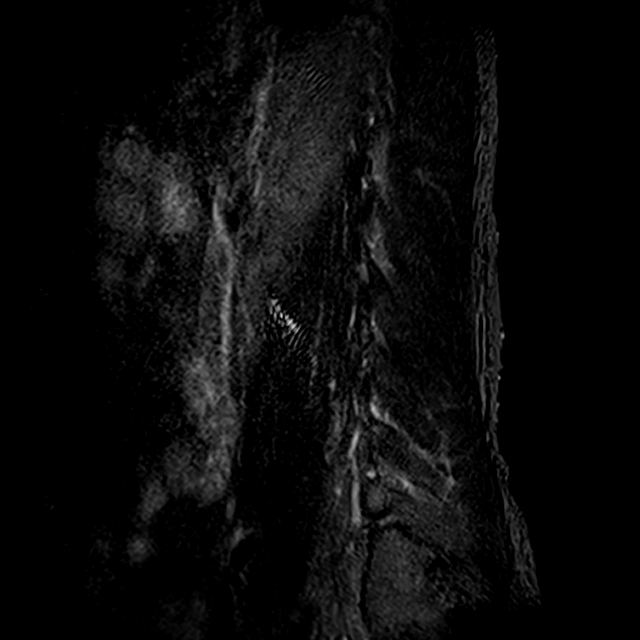

[Series 5: T2 · sagittal · 4.0mm · 0.39mm/px · 3 of 15 slices shown (2 of 3)]
[im 3/15]
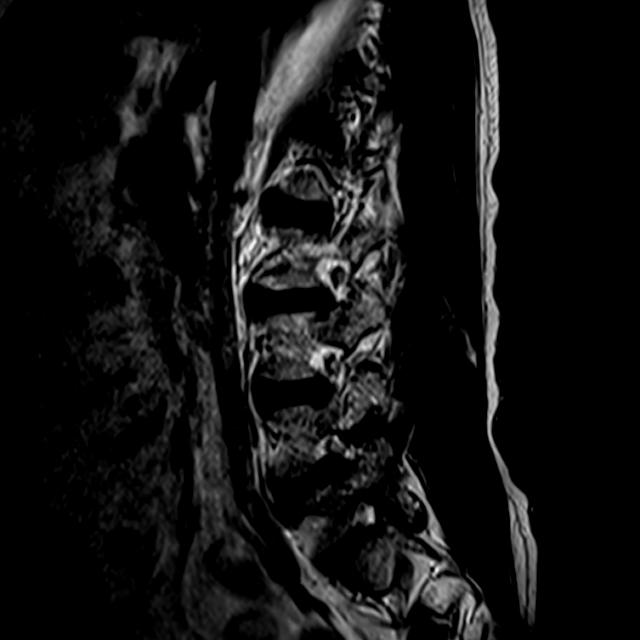
[im 9/15]
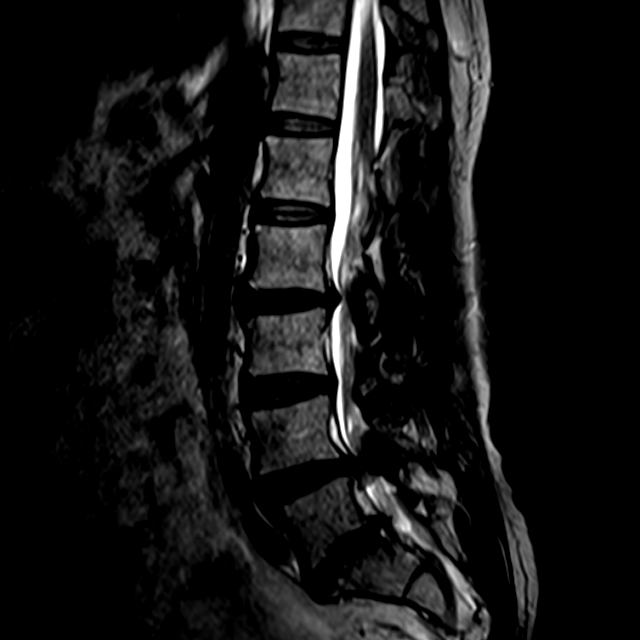
[im 15/15]
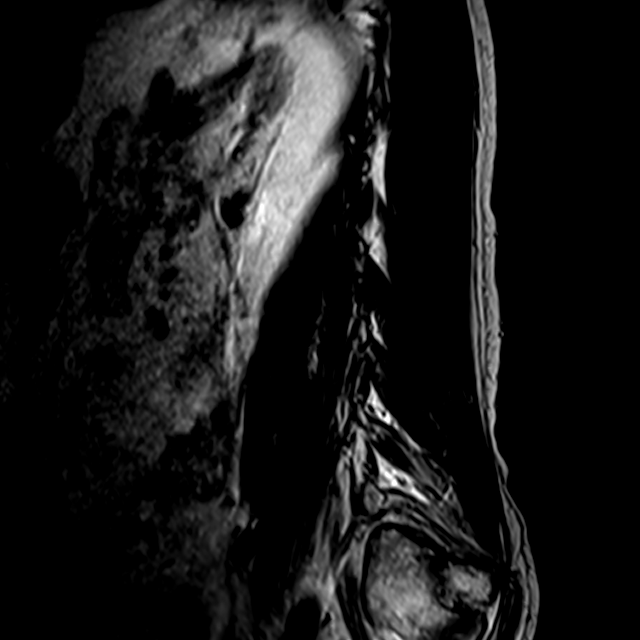

[Series 6: T1 · sagittal · 4.0mm · 0.39mm/px · 3 of 15 slices shown]
[im 3/15]
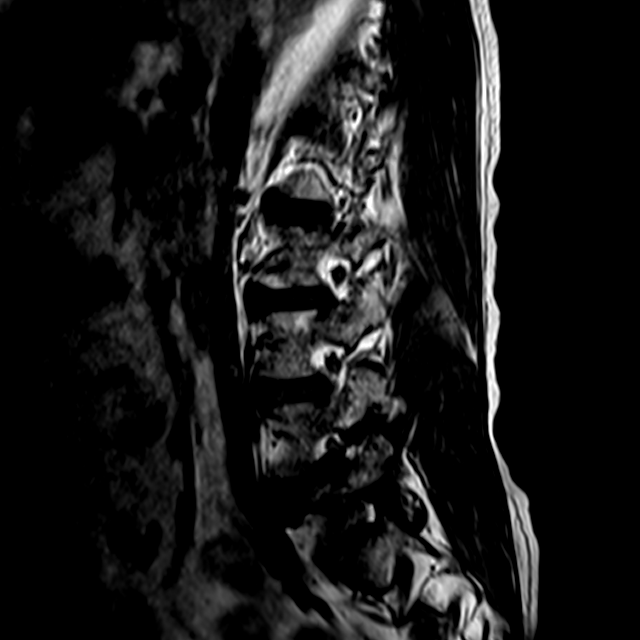
[im 9/15]
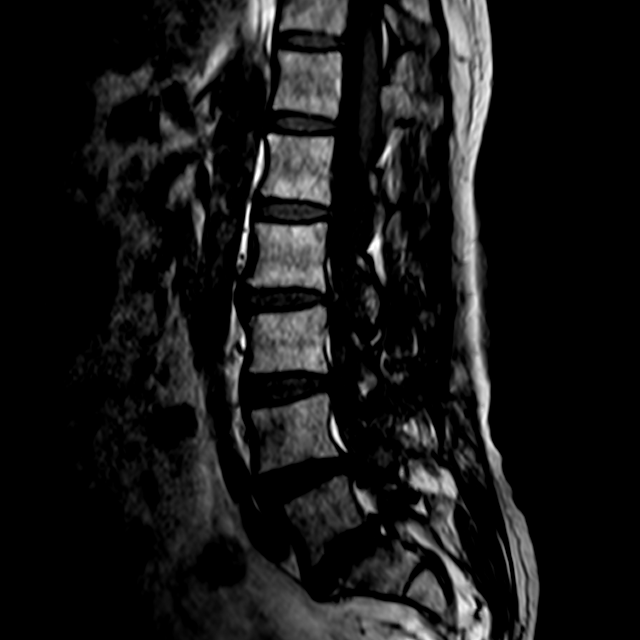
[im 15/15]
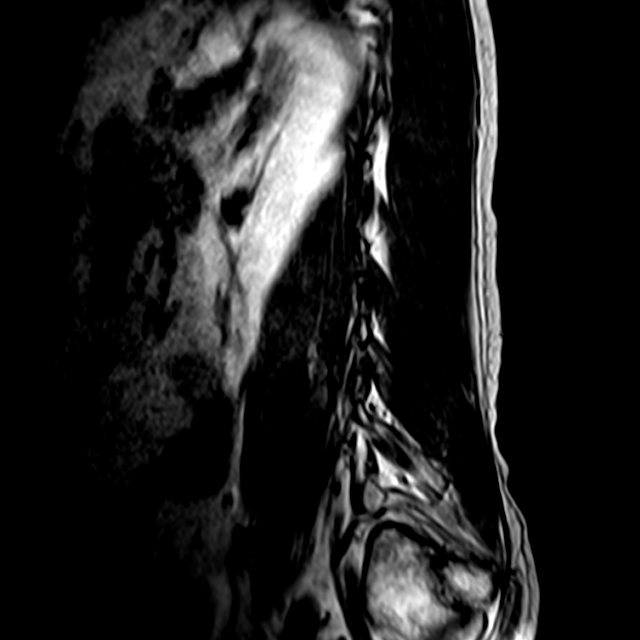

[Series 7: T2 · axial · 4.0mm · 0.25mm/px · z∈[-96,+34]mm · 3 of 40 slices shown (3 of 3)]
[im 6/40]
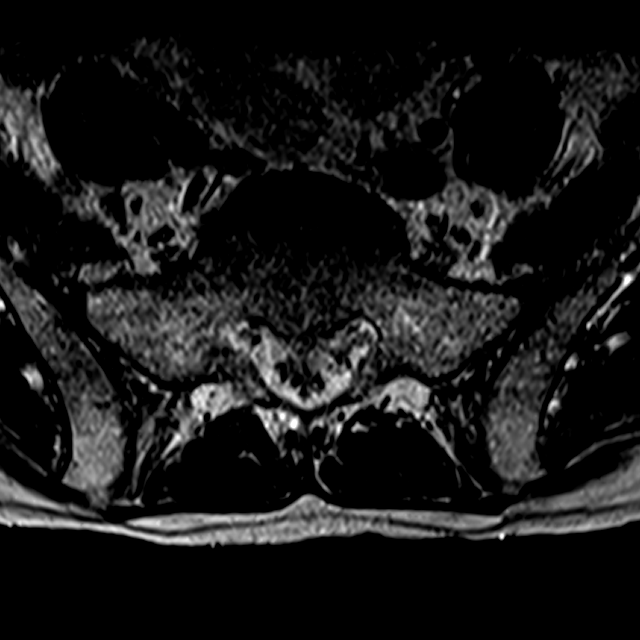
[im 20/40]
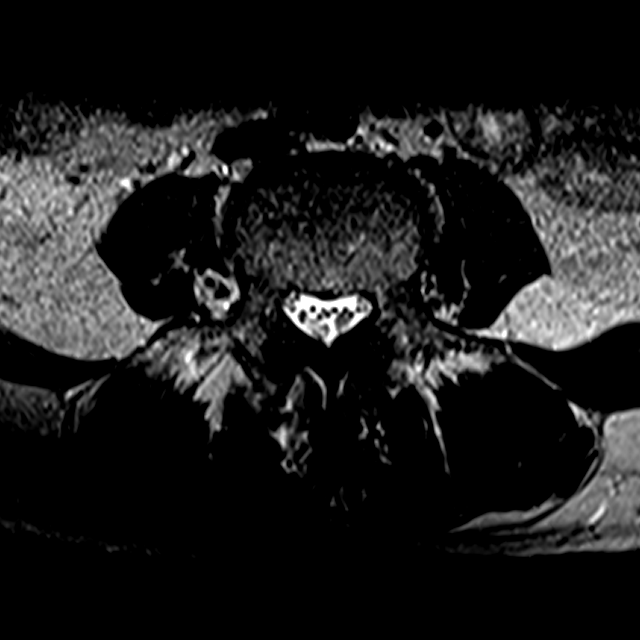
[im 34/40]
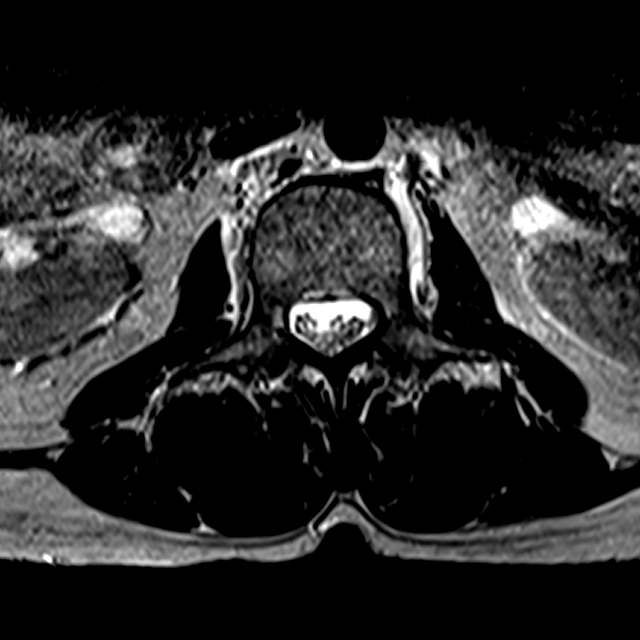

[12 of 48 positions shown; findings below may reference images not displayed]

FINDINGS: Segmentation:  Normal on the comparison.

Alignment: Preserved lumbar lordosis. Subtle anterolisthesis at
L4-L5.

Vertebrae: No marrow edema or evidence of acute osseous abnormality.
Chronic degenerative endplate signal changes at L5-S1. Normal
background bone marrow signal. Intact visible sacrum and SI joints.

Conus medullaris and cauda equina: Conus extends to the T12-L1
level. No lower spinal cord or conus signal abnormality.

Paraspinal and other soft tissues: Negative.

Disc levels:

T11-T12 through L1-L2: Negative.

L2-L3: Disc space loss with circumferential disc bulge and
superimposed broad-based moderate size central disc extrusion with
mild lobulation (series 5, image 8). Mild posterior element
hypertrophy and epidural lipomatosis. Moderate spinal and left
lateral recess stenosis (left L3 nerve level). Mild left L2
foraminal stenosis.

L3-L4: Circumferential but mostly far lateral disc bulging with mild
to moderate posterior element hypertrophy. Mild spinal stenosis.
Borderline to mild foraminal stenosis greater on the right.

L4-L5: Bulky circumferential disc bulge or protrusion. Severe
posterior element degeneration including ligament flavum
hypertrophy. Trace degenerative facet joint fluid on the right.
Severe spinal and lateral recess stenosis (bilateral L5 nerve
levels). Moderate right L4 foraminal stenosis. Borderline to mild on
the left.

L5-S1: Disc space loss with vacuum disc. Circumferential disc
osteophyte complex and mild to moderate posterior element
hypertrophy. No spinal stenosis. Mild to moderate lateral recess
stenosis greater on the right (S1 nerve levels). Moderate left L5
foraminal stenosis.
IMPRESSION: 1. Severe multifactorial spinal and lateral recess stenosis at L4-L5
with moderate right foraminal stenosis. Query L5 radiculitis.
2. Moderate spinal and left lateral recess stenosis at L2-L3 related
to central disc herniation. Mild multifactorial spinal stenosis at
L3-L4.
3. Advanced chronic disc and endplate degeneration at L5-S1 with
lateral recess and left foraminal stenosis.

## 2020-01-24 ENCOUNTER — Other Ambulatory Visit: Payer: Self-pay | Admitting: *Deleted

## 2020-01-26 ENCOUNTER — Ambulatory Visit (INDEPENDENT_AMBULATORY_CARE_PROVIDER_SITE_OTHER): Payer: 59 | Admitting: Orthopaedic Surgery

## 2020-01-26 ENCOUNTER — Encounter: Payer: Self-pay | Admitting: Orthopaedic Surgery

## 2020-01-26 ENCOUNTER — Other Ambulatory Visit: Payer: Self-pay

## 2020-01-26 VITALS — Ht 65.0 in | Wt 138.0 lb

## 2020-01-26 DIAGNOSIS — G8929 Other chronic pain: Secondary | ICD-10-CM | POA: Diagnosis not present

## 2020-01-26 DIAGNOSIS — M5441 Lumbago with sciatica, right side: Secondary | ICD-10-CM

## 2020-01-26 NOTE — Progress Notes (Signed)
Patient Jimmy Craig, male DOB:[birth year], 69 y.o. VMO:[id number]  Chief Complaint  Patient presents with  . Back Pain    its worse/ here to go over MRI    HPI  Jimmy Craig is a 69 y.o. male who continues to have lower back pain with paresthesias on the right.  He had his MRI which showed: IMPRESSION: 1. Severe multifactorial spinal and lateral recess stenosis at L4-L5 with moderate right foraminal stenosis. Query L5 radiculitis. 2. Moderate spinal and left lateral recess stenosis at L2-L3 related to central disc herniation. Mild multifactorial spinal stenosis at L3-L4. 3. 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  I have independently reviewed the MRI.  I have told him of the findings.  I will have him see neurosurgery.  He is agreeable to this.   Body mass index is 22.96 kg/m.  ROS  Review of Systems  Musculoskeletal: Positive for arthralgias and neck pain.  All other systems reviewed and are negative.   All other systems reviewed and are negative.  The following is a summary of the past history medically, past history surgically, known current medicines, social history and family history.  This information is gathered electronically by the computer from prior information and documentation.  I review this each visit and have found including this information at this point in the chart is beneficial and informative.    Past Medical History:  Diagnosis Date  . Arthritis   . Diabetes mellitus without complication (HCC)   . GERD (gastroesophageal reflux disease)     Past Surgical History:  Procedure Laterality Date  . CATARACT EXTRACTION Right   . CATARACT EXTRACTION W/PHACO Left 12/20/2013   Procedure: CATARACT EXTRACTION PHACO AND INTRAOCULAR LENS PLACEMENT (IOC);  Surgeon: Susa Simmonds, MD;  Location: [facility];  Service: Ophthalmology;  Laterality: Left;  CDE: 0.97    Family History   Problem Relation Age of Onset  . Diabetes Mother     Social History Social History   Tobacco Use  . Smoking status: Never Smoker  . Smokeless tobacco: Never Used  Substance Use Topics  . Alcohol use: No  . Drug use: No    No Known Allergies  Current Outpatient Medications  Medication Sig Dispense Refill  . aspirin EC 81 MG tablet Take 81 mg by mouth daily.    Marland Kitchen atorvastatin (LIPITOR) 40 MG tablet TAKE 1 TABLET BY MOUTH ONCE DAILY (  NEEDS  TO  BE  SEEN  BEFORE  NEXT  REFILL) 30 tablet 0  . blood glucose meter kit and supplies Dispense based on patient and insurance preference. Use up to four times daily as directed. (FOR ICD-10 E10.9, E11.9). 1 each 0  . cetirizine (ZYRTEC) 10 MG tablet Take 1 tablet (10 mg total) by mouth daily. 90 tablet 2  . diclofenac (VOLTAREN) 75 MG EC tablet Take 1 tablet (75 mg total) by mouth 2 (two) times daily with a meal. 60 tablet 2  . fluticasone (FLONASE) 50 MCG/ACT nasal spray Place 2 sprays into both nostrils 2 (two) times daily. 11.1 g 1  . gabapentin (NEURONTIN) 100 MG capsule TAKE 1 CAPSULE BY MOUTH THREE TIMES DAILY 90 capsule 0  . glimepiride (AMARYL) 2 MG tablet Take 1 tablet by mouth once daily with breakfast 90 tablet 0  . glucose blood (ACCU-CHEK GUIDE) test strip Use 1 strip up to 4 times daily.  E10.9, E11.9 100 each 12  . insulin detemir (LEVEMIR) 100 UNIT/ML  injection Inject 0.45 mLs (45 Units total) into the skin at bedtime. 10 mL 11  . Insulin Pen Needle (PEN NEEDLES) 32G X 4 MM MISC 1 application by Does not apply route daily. 100 each 11  . Lancets (ONETOUCH DELICA PLUS SMOLMB86L) MISC Apply 1 each topically 4 (four) times daily.    Marland Kitchen lisinopril (ZESTRIL) 10 MG tablet Take 1 tablet (10 mg total) by mouth daily. 90 tablet 3  . metFORMIN (GLUCOPHAGE) 1000 MG tablet Take 1 tablet (1,000 mg total) by mouth 2 (two) times daily with a meal. 180 tablet 3  . Multiple Vitamin (MULTIVITAMIN WITH MINERALS) TABS tablet Take 1 tablet by mouth  daily.    . pantoprazole (PROTONIX) 40 MG tablet Take 1 tablet (40 mg total) by mouth daily. 90 tablet 3  . predniSONE (DELTASONE) 10 MG tablet Take 5 daily for 2 days followed by 4,3,2 and 1 for 2 days each. 30 tablet 0  . STEGLATRO 15 MG TABS tablet TAKE 1 TABLET BY MOUTH ONCE DAILY (  NEEDS  TO  BE  SEEN  BEFORE  NEXT  REFILL) 30 tablet 0  . tamsulosin (FLOMAX) 0.4 MG CAPS capsule Take 1 capsule by mouth once daily 90 capsule 1  . TRULICITY 1.5 JQ/4.9EE SOPN INJECT CONTENTS OF  1 PEN SUBCUTANEOUSLY ONCE A WEEK 12 mL 0   No current facility-administered medications for this visit.     Physical Exam  Height '5\' 5"'$  (1.651 m), weight 138 lb (62.6 kg).  Constitutional: overall normal hygiene, normal nutrition, well developed, normal grooming, normal body habitus. Assistive device:none  Musculoskeletal: gait and station Limp none, muscle tone and strength are normal, no tremors or atrophy is present.  .  Neurological: coordination overall normal.  Deep tendon reflex/nerve stretch intact.  Sensation normal.  Cranial nerves II-XII intact.   Skin:   Normal overall no scars, lesions, ulcers or rashes. No psoriasis.  Psychiatric: Alert and oriented x 3.  Recent memory intact, remote memory unclear.  Normal mood and affect. Well groomed.  Good eye contact.  Cardiovascular: overall no swelling, no varicosities, no edema bilaterally, normal temperatures of the legs and arms, no clubbing, cyanosis and good capillary refill.  Lymphatic: palpation is normal.  Lower back is tender, more on the right, but no spasm.  ROM is good.  He has positive SLR on the right at 30 degrees and decreased Achilles reflex on the right.  Gait is good.  Muscle strength good.   All other systems reviewed and are negative   The patient has been educated about the nature of the problem(s) and counseled on treatment options.  The patient appeared to understand what I have discussed and is in agreement with  it.  Encounter Diagnosis  Name Primary?  . Chronic midline low back pain with right-sided sciatica Yes    PLAN Call if any problems.  Precautions discussed.  Continue current medications.   Return to clinic to neurosurgery for further evaluation.   Electronically Signed Sanjuana Kava, MD 6/23/202110:08 AM

## 2020-02-04 ENCOUNTER — Other Ambulatory Visit: Payer: Self-pay | Admitting: Family

## 2020-02-04 DIAGNOSIS — E785 Hyperlipidemia, unspecified: Secondary | ICD-10-CM

## 2020-02-04 DIAGNOSIS — N401 Enlarged prostate with lower urinary tract symptoms: Secondary | ICD-10-CM

## 2020-02-07 ENCOUNTER — Other Ambulatory Visit: Payer: Self-pay | Admitting: Family

## 2020-02-13 ENCOUNTER — Other Ambulatory Visit: Payer: Self-pay | Admitting: Family

## 2020-02-13 DIAGNOSIS — E1169 Type 2 diabetes mellitus with other specified complication: Secondary | ICD-10-CM

## 2020-02-13 DIAGNOSIS — M79604 Pain in right leg: Secondary | ICD-10-CM

## 2020-02-25 ENCOUNTER — Ambulatory Visit (INDEPENDENT_AMBULATORY_CARE_PROVIDER_SITE_OTHER): Payer: 59

## 2020-02-25 ENCOUNTER — Encounter: Payer: Self-pay | Admitting: Family

## 2020-02-25 ENCOUNTER — Other Ambulatory Visit: Payer: Self-pay

## 2020-02-25 ENCOUNTER — Ambulatory Visit (INDEPENDENT_AMBULATORY_CARE_PROVIDER_SITE_OTHER): Payer: 59 | Admitting: Family

## 2020-02-25 VITALS — BP 137/74 | HR 82 | Temp 97.6°F | Ht 65.0 in | Wt 139.0 lb

## 2020-02-25 DIAGNOSIS — E1142 Type 2 diabetes mellitus with diabetic polyneuropathy: Secondary | ICD-10-CM

## 2020-02-25 DIAGNOSIS — Z794 Long term (current) use of insulin: Secondary | ICD-10-CM | POA: Diagnosis not present

## 2020-02-25 DIAGNOSIS — M8949 Other hypertrophic osteoarthropathy, multiple sites: Secondary | ICD-10-CM | POA: Diagnosis not present

## 2020-02-25 DIAGNOSIS — M25559 Pain in unspecified hip: Secondary | ICD-10-CM | POA: Diagnosis not present

## 2020-02-25 DIAGNOSIS — M159 Polyosteoarthritis, unspecified: Secondary | ICD-10-CM

## 2020-02-25 LAB — BAYER DCA HB A1C WAIVED: HB A1C (BAYER DCA - WAIVED): 7.8 % — ABNORMAL HIGH (ref <7.0)

## 2020-02-25 IMAGING — DX DG HIP (WITH OR WITHOUT PELVIS) 2-3V*R*
3 series · 3 of 3 positions shown · non-contrast
Comparison: Lumbar MRI, [DATE].

CLINICAL DATA: Right hip pain.

EXAM:
DG HIP (WITH OR WITHOUT PELVIS) 2-3V RIGHT

[pelvis ap]
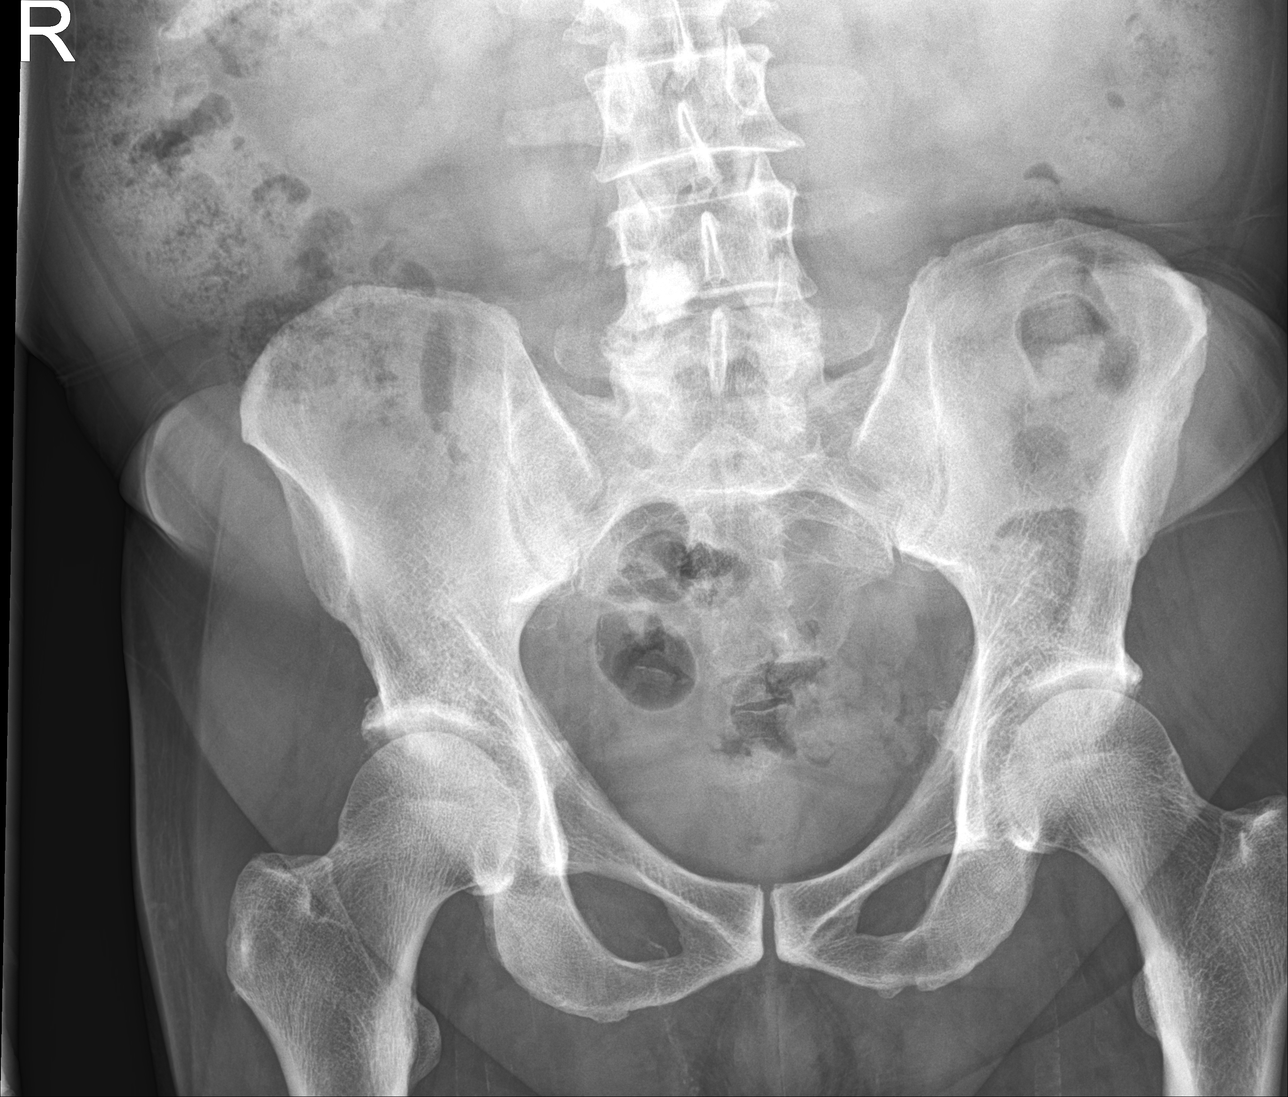

[hip ap]
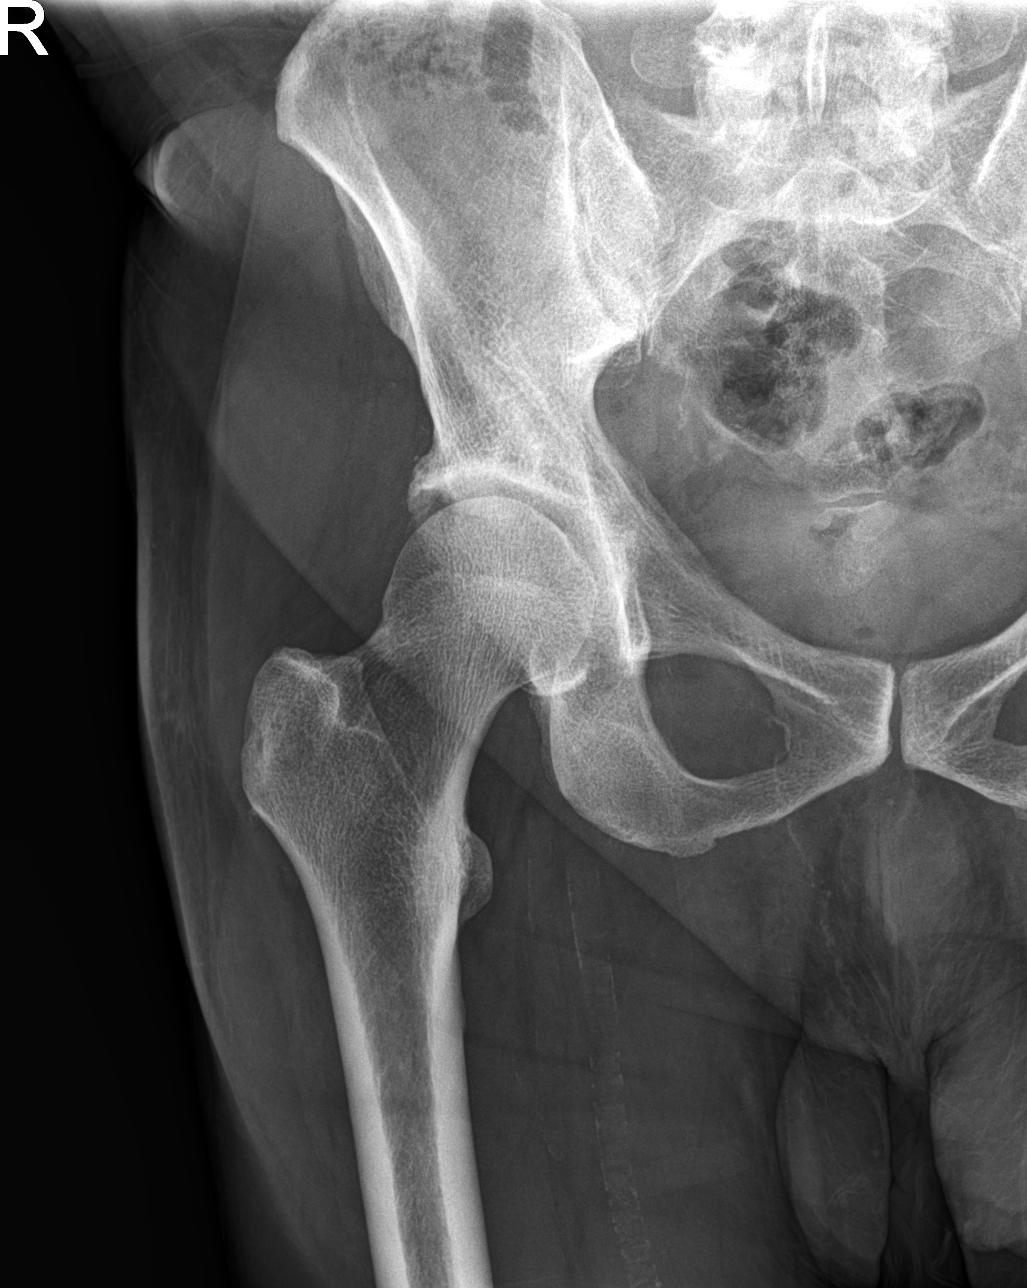

[hip lat]
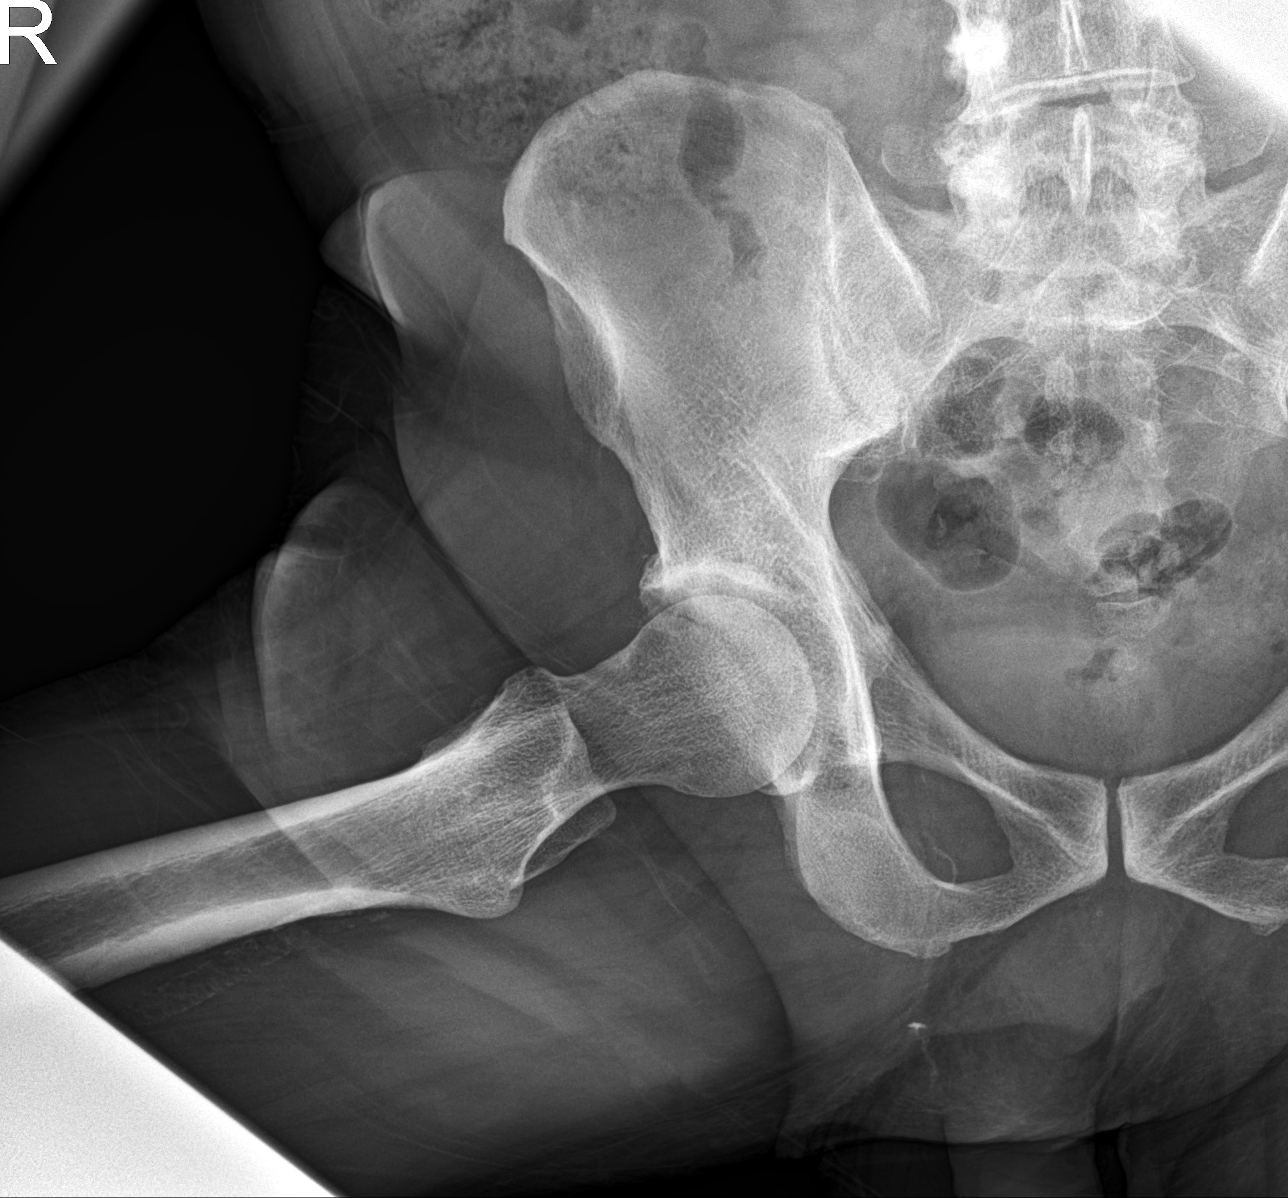

[3 of 3 positions shown; findings below may reference images not displayed]

FINDINGS: No fracture. There is degenerative sclerosis projecting along the
right facets at L4-L5 and L5-S1. No bone lesion.

Both hip joints are normally spaced and aligned. No significant
arthropathic/degenerative change.

There are scattered femoral artery atherosclerotic calcifications.
Soft tissues are otherwise unremarkable.
IMPRESSION: 1. No fracture, bone lesion or hip joint abnormality.

## 2020-02-25 MED ORDER — CELECOXIB 200 MG PO CAPS: 200.0000 mg | ORAL_CAPSULE | Freq: Every day | ORAL | 1 refills | Status: DC

## 2020-02-25 NOTE — Progress Notes (Signed)
Subjective:    Patient ID: Jimmy Craig, male    DOB: 05-17-1951, 69 y.o.   MRN: [id number]  Chief Complaint  Patient presents with  . Diabetes    2 mth rck, wants referral for eye exam  . Leg Pain    local pain   Pt presents to the office today for follow up on diabetes. He has chronic back pain and is followed by Neurosurgeon. He had a MRI in 01/2020 and is going to have surgery. He has bilateral leg pain, but is complaining of right hip pain and right knee pain that is a "different type of pain".  Diabetes He presents for his follow-up diabetic visit. He has type 2 diabetes mellitus. His disease course has been stable. There are no hypoglycemic associated symptoms. Associated symptoms include blurred vision and foot paresthesias. Symptoms are stable. Diabetic complications include heart disease and peripheral neuropathy. Pertinent negatives for diabetic complications include no nephropathy. Risk factors for coronary artery disease include dyslipidemia, diabetes mellitus, hypertension, male sex and sedentary lifestyle. He is following a generally healthy diet. His overall blood glucose range is 130-140 mg/dl. An ACE inhibitor/angiotensin II receptor blocker is being taken. Eye exam is not current.  Leg Pain  The incident occurred more than 1 week ago. There was no injury mechanism. The pain is present in the right leg, right hip, right thigh and left thigh. The pain is moderate. Associated symptoms include numbness and tingling. Pertinent negatives include no muscle weakness.  Hip Pain  The incident occurred more than 1 week ago. There was no injury mechanism. The pain is present in the right hip. The pain is at a severity of 7/10. The pain is moderate. The pain has been intermittent since onset. Associated symptoms include numbness and tingling. Pertinent negatives include no muscle weakness. He reports no foreign bodies present. The symptoms are aggravated by palpation and weight bearing.  He has tried NSAIDs for the symptoms. The treatment provided mild relief.      Review of Systems  Eyes: Positive for blurred vision.  Neurological: Positive for tingling and numbness.  All other systems reviewed and are negative.      Objective:   Physical Exam Vitals reviewed.  Constitutional:      General: He is not in acute distress.    Appearance: He is well-developed.  HENT:     Head: Normocephalic.     Right Ear: Tympanic membrane normal.     Left Ear: Tympanic membrane normal.  Eyes:     General:        Right eye: No discharge.        Left eye: No discharge.     Pupils: Pupils are equal, round, and reactive to light.  Neck:     Thyroid: No thyromegaly.  Cardiovascular:     Rate and Rhythm: Normal rate and regular rhythm.     Heart sounds: Normal heart sounds. No murmur heard.   Pulmonary:     Effort: Pulmonary effort is normal. No respiratory distress.     Breath sounds: Normal breath sounds. No wheezing.  Abdominal:     General: Bowel sounds are normal. There is no distension.     Palpations: Abdomen is soft.     Tenderness: There is no abdominal tenderness.  Musculoskeletal:        General: No tenderness.     Cervical back: Normal range of motion and neck supple.     Comments: Pain in right hip with flexion  and extension  Skin:    General: Skin is warm and dry.     Findings: No erythema or rash.  Neurological:     Mental Status: He is alert and oriented to person, place, and time.     Cranial Nerves: No cranial nerve deficit.     Deep Tendon Reflexes: Reflexes are normal and symmetric.  Psychiatric:        Behavior: Behavior normal.        Thought Content: Thought content normal.        Judgment: Judgment normal.       BP (!) 137/74   Pulse 82   Temp 97.6 F (36.4 C) (Temporal)   Ht 5' 5"  (1.651 m)   Wt 139 lb (63 kg)   SpO2 96%   BMI 23.13 kg/m      Assessment & Plan:  Jimmy Craig comes in today with chief complaint of Diabetes (2  mth rck, wants referral for eye exam) and Leg Pain (local pain)   Diagnosis and orders addressed:  1. Type 2 diabetes mellitus with diabetic polyneuropathy, with long-term current use of insulin ([place]) Continue medication  - Ambulatory referral to Ophthalmology - Bayer DCA Hb A1c Waived - BMP8+EGFR  2. Primary osteoarthritis involving multiple joints Stop Diclofenac and start Celebrex   Don't take any other NSAID"s - celecoxib (CELEBREX) 200 MG capsule; Take 1 capsule (200 mg total) by mouth daily.  Dispense: 90 capsule; Refill: 1 - BMP8+EGFR  3. Hip pain - DG HIP UNILAT W OR W/O PELVIS 2-3 VIEWS RIGHT; Future - celecoxib (CELEBREX) 200 MG capsule; Take 1 capsule (200 mg total) by mouth daily.  Dispense: 90 capsule; Refill: 1 - BMP8+EGFR   Labs pending Health Maintenance reviewed Diet and exercise encouraged  Follow up plan: 3 months    Evelina Dun, FNP

## 2020-02-25 NOTE — Patient Instructions (Signed)

## 2020-02-26 LAB — BMP8+EGFR
BUN/Creatinine Ratio: 27 — ABNORMAL HIGH (ref 10–24)
BUN: 27 mg/dL (ref 8–27)
CO2: 23 mmol/L (ref 20–29)
Calcium: 9.2 mg/dL (ref 8.6–10.2)
Chloride: 102 mmol/L (ref 96–106)
Creatinine, Ser: 1 mg/dL (ref 0.76–1.27)
GFR calc Af Amer: 90 mL/min/{1.73_m2} (ref >59)
GFR calc non Af Amer: 78 mL/min/{1.73_m2} (ref >59)
Glucose: 197 mg/dL — ABNORMAL HIGH (ref 65–99)
Potassium: 4.2 mmol/L (ref 3.5–5.2)
Sodium: 138 mmol/L (ref 134–144)

## 2020-03-07 ENCOUNTER — Other Ambulatory Visit: Payer: Self-pay | Admitting: Family

## 2020-03-07 DIAGNOSIS — E1169 Type 2 diabetes mellitus with other specified complication: Secondary | ICD-10-CM

## 2020-03-15 ENCOUNTER — Other Ambulatory Visit: Payer: Self-pay | Admitting: Family

## 2020-03-15 DIAGNOSIS — M79604 Pain in right leg: Secondary | ICD-10-CM

## 2020-03-22 ENCOUNTER — Other Ambulatory Visit: Payer: Self-pay | Admitting: Family

## 2020-03-22 DIAGNOSIS — E1169 Type 2 diabetes mellitus with other specified complication: Secondary | ICD-10-CM

## 2020-04-18 ENCOUNTER — Other Ambulatory Visit: Payer: Self-pay | Admitting: Family

## 2020-04-18 DIAGNOSIS — E1169 Type 2 diabetes mellitus with other specified complication: Secondary | ICD-10-CM

## 2020-04-18 DIAGNOSIS — M79604 Pain in right leg: Secondary | ICD-10-CM

## 2020-05-04 ENCOUNTER — Other Ambulatory Visit: Payer: Self-pay | Admitting: Family

## 2020-05-04 DIAGNOSIS — N401 Enlarged prostate with lower urinary tract symptoms: Secondary | ICD-10-CM

## 2020-05-04 DIAGNOSIS — E785 Hyperlipidemia, unspecified: Secondary | ICD-10-CM

## 2020-05-07 ENCOUNTER — Other Ambulatory Visit: Payer: Self-pay | Admitting: Family

## 2020-05-07 DIAGNOSIS — E1169 Type 2 diabetes mellitus with other specified complication: Secondary | ICD-10-CM

## 2020-05-16 ENCOUNTER — Other Ambulatory Visit: Payer: Self-pay | Admitting: Family

## 2020-05-16 DIAGNOSIS — M79604 Pain in right leg: Secondary | ICD-10-CM

## 2020-05-29 ENCOUNTER — Other Ambulatory Visit: Payer: Self-pay | Admitting: Family

## 2020-05-29 DIAGNOSIS — E1169 Type 2 diabetes mellitus with other specified complication: Secondary | ICD-10-CM

## 2020-06-01 ENCOUNTER — Ambulatory Visit (INDEPENDENT_AMBULATORY_CARE_PROVIDER_SITE_OTHER): Payer: 59 | Admitting: Family

## 2020-06-01 ENCOUNTER — Other Ambulatory Visit: Payer: Self-pay

## 2020-06-01 ENCOUNTER — Ambulatory Visit (INDEPENDENT_AMBULATORY_CARE_PROVIDER_SITE_OTHER): Payer: 59

## 2020-06-01 ENCOUNTER — Encounter: Payer: Self-pay | Admitting: Family

## 2020-06-01 VITALS — BP 137/71 | HR 90 | Temp 97.2°F | Ht 65.0 in | Wt 136.6 lb

## 2020-06-01 DIAGNOSIS — R0602 Shortness of breath: Secondary | ICD-10-CM | POA: Diagnosis not present

## 2020-06-01 DIAGNOSIS — E1169 Type 2 diabetes mellitus with other specified complication: Secondary | ICD-10-CM

## 2020-06-01 DIAGNOSIS — R351 Nocturia: Secondary | ICD-10-CM

## 2020-06-01 DIAGNOSIS — E1142 Type 2 diabetes mellitus with diabetic polyneuropathy: Secondary | ICD-10-CM | POA: Diagnosis not present

## 2020-06-01 DIAGNOSIS — E1159 Type 2 diabetes mellitus with other circulatory complications: Secondary | ICD-10-CM

## 2020-06-01 DIAGNOSIS — N401 Enlarged prostate with lower urinary tract symptoms: Secondary | ICD-10-CM

## 2020-06-01 DIAGNOSIS — K5900 Constipation, unspecified: Secondary | ICD-10-CM

## 2020-06-01 DIAGNOSIS — Z23 Encounter for immunization: Secondary | ICD-10-CM

## 2020-06-01 DIAGNOSIS — K219 Gastro-esophageal reflux disease without esophagitis: Secondary | ICD-10-CM | POA: Diagnosis not present

## 2020-06-01 DIAGNOSIS — E785 Hyperlipidemia, unspecified: Secondary | ICD-10-CM

## 2020-06-01 DIAGNOSIS — M8949 Other hypertrophic osteoarthropathy, multiple sites: Secondary | ICD-10-CM

## 2020-06-01 DIAGNOSIS — I152 Hypertension secondary to endocrine disorders: Secondary | ICD-10-CM

## 2020-06-01 DIAGNOSIS — M159 Polyosteoarthritis, unspecified: Secondary | ICD-10-CM

## 2020-06-01 DIAGNOSIS — D509 Iron deficiency anemia, unspecified: Secondary | ICD-10-CM

## 2020-06-01 DIAGNOSIS — M150 Primary generalized (osteo)arthritis: Secondary | ICD-10-CM

## 2020-06-01 DIAGNOSIS — Z794 Long term (current) use of insulin: Secondary | ICD-10-CM

## 2020-06-01 LAB — BAYER DCA HB A1C WAIVED: HB A1C (BAYER DCA - WAIVED): 8.7 % — ABNORMAL HIGH (ref <7.0)

## 2020-06-01 IMAGING — DX DG CHEST 2V
2 series · 2 of 2 positions shown · non-contrast
Comparison: None.

CLINICAL DATA: Shortness of breath

EXAM:
CHEST - 2 VIEW

[chest pa]
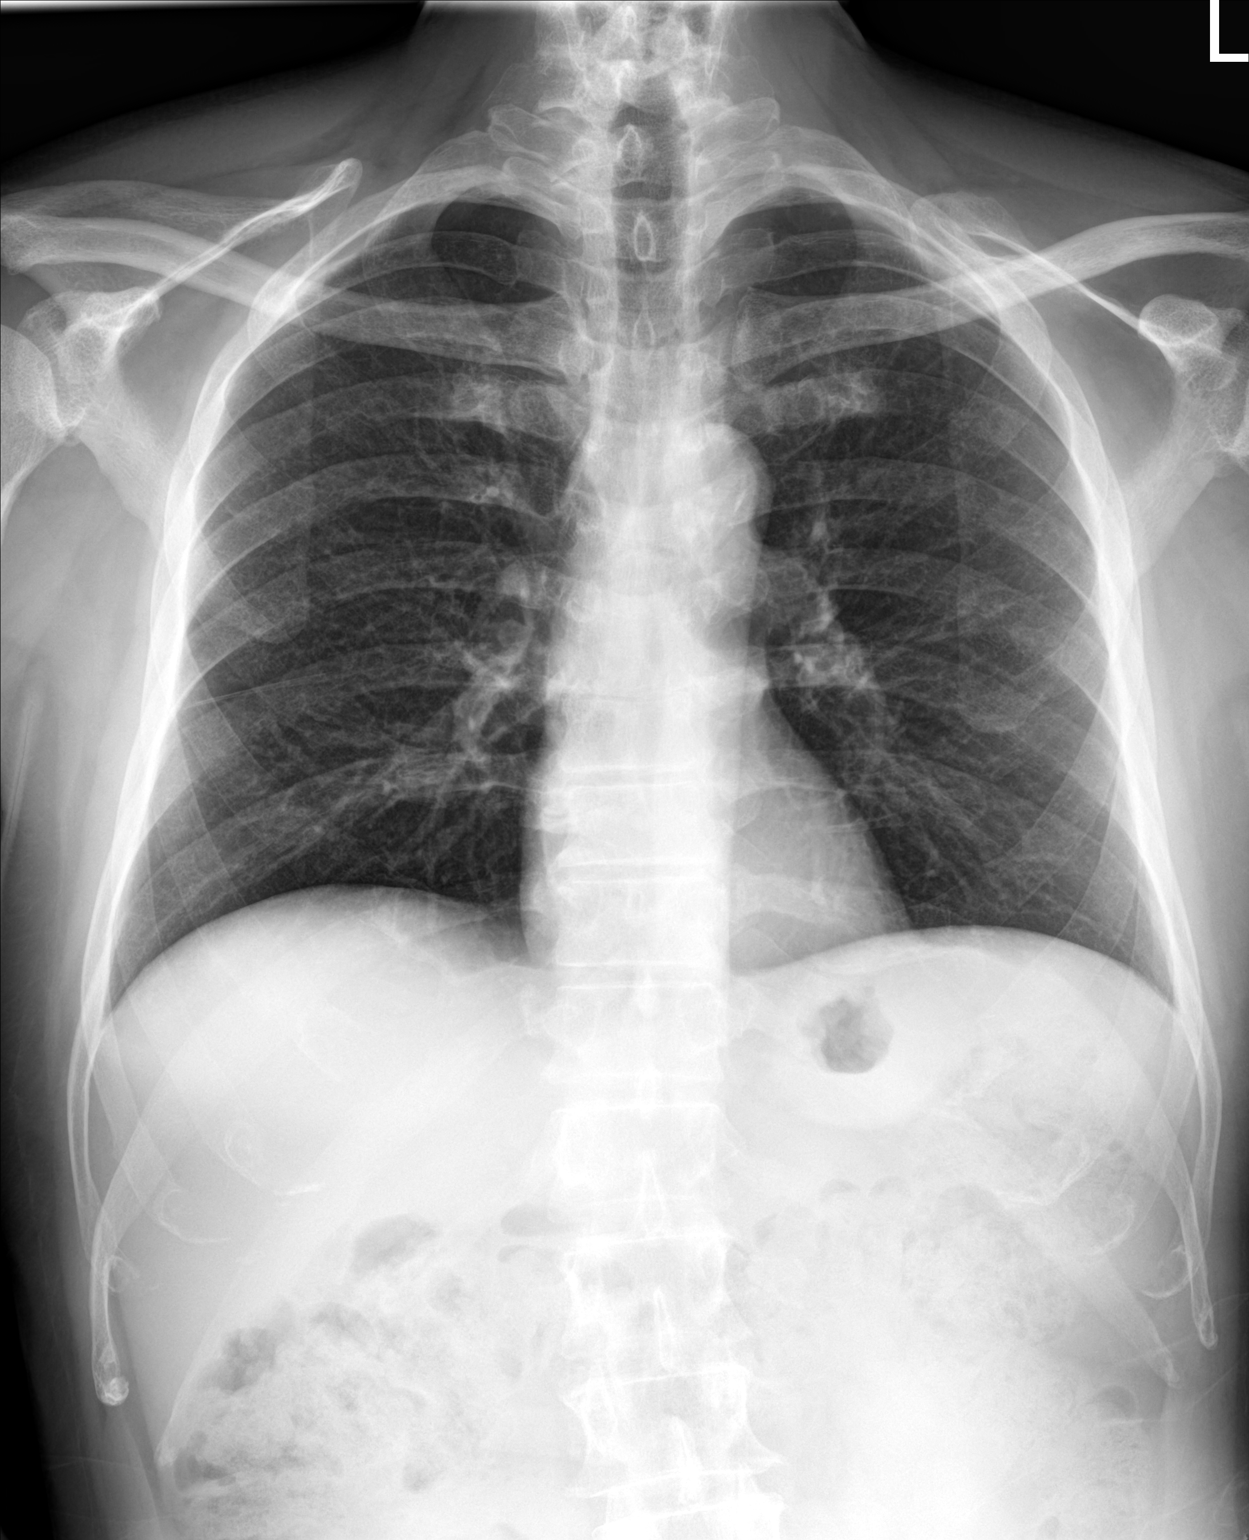

[chest lat]
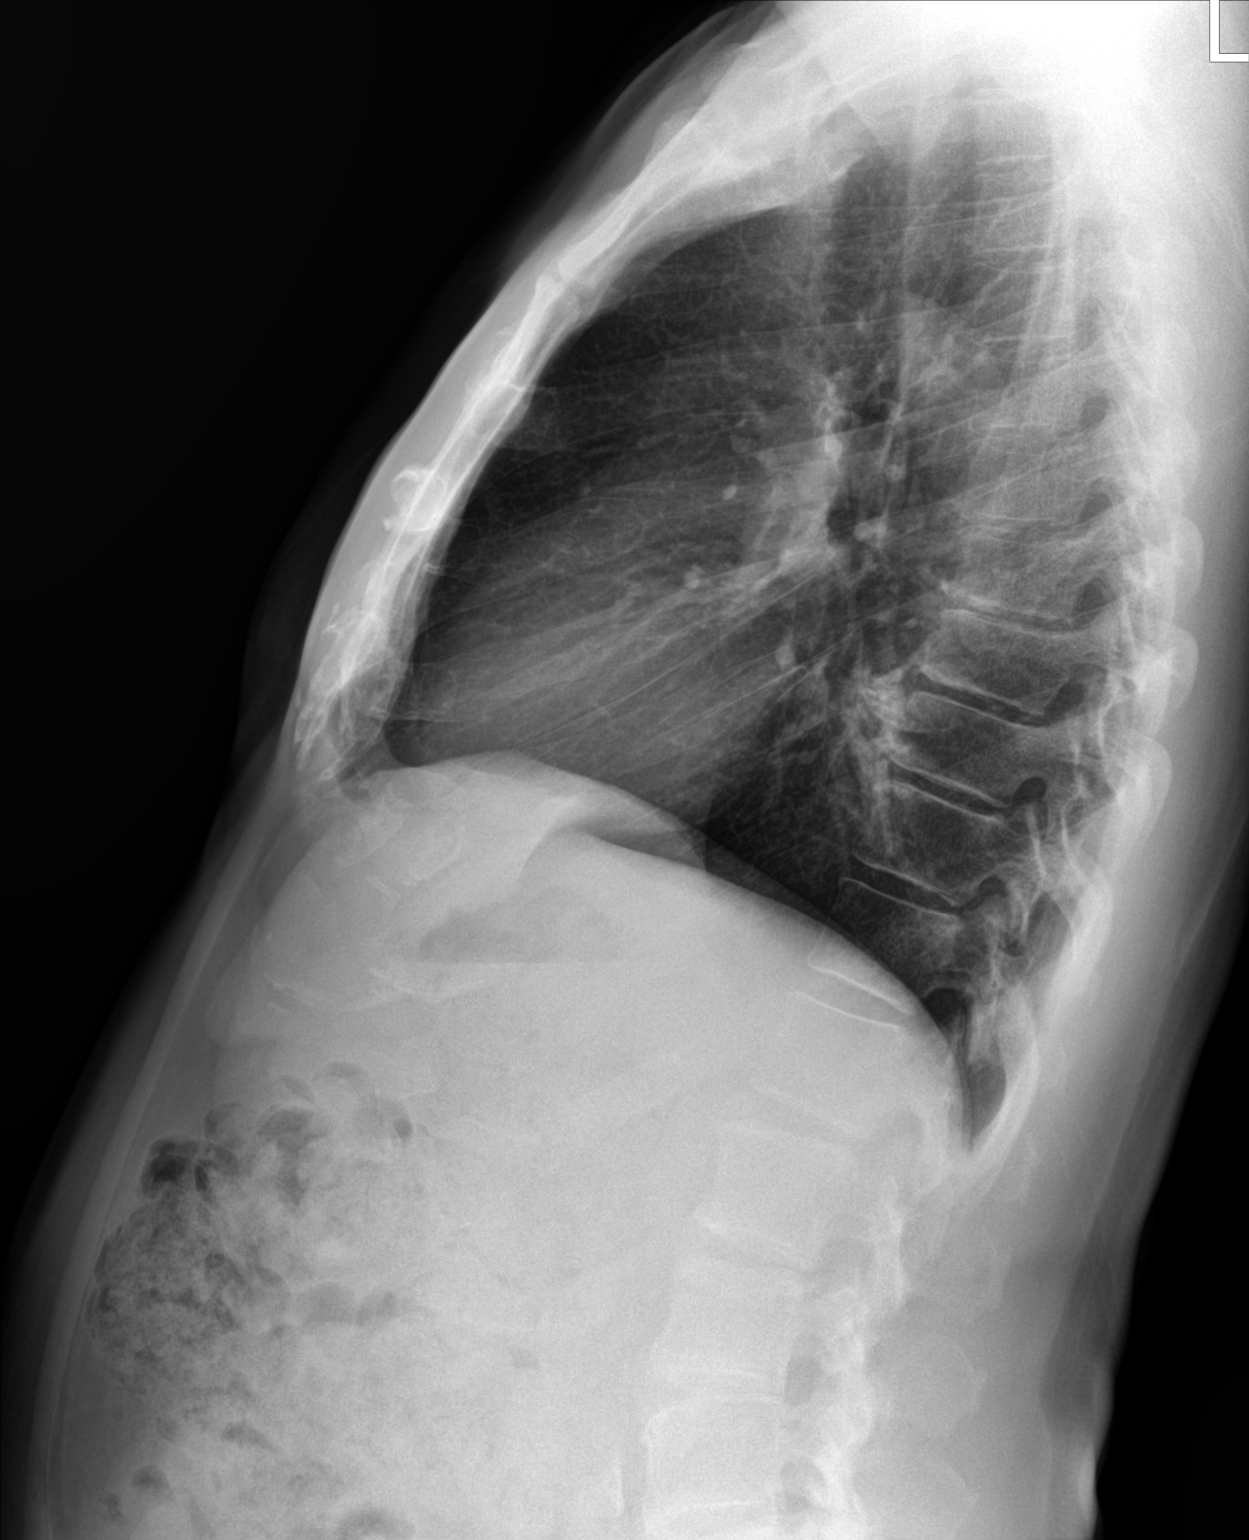

[2 of 2 positions shown; findings below may reference images not displayed]

FINDINGS: The heart size and mediastinal contours are within normal limits.
Both lungs are clear. The visualized skeletal structures are
unremarkable.
IMPRESSION: No active cardiopulmonary disease.

## 2020-06-01 MED ORDER — TRULICITY 3 MG/0.5ML ~~LOC~~ SOAJ: 3.0000 mg | SUBCUTANEOUS | 2 refills | Status: DC

## 2020-06-01 MED ORDER — POLYETHYLENE GLYCOL 3350 17 GM/SCOOP PO POWD: 17.0000 g | Freq: Two times a day (BID) | ORAL | 1 refills | Status: DC | PRN

## 2020-06-01 NOTE — Patient Instructions (Signed)

## 2020-06-01 NOTE — Progress Notes (Signed)
Subjective:    Patient ID: Jimmy Craig, male    DOB: 1951-05-19, 69 y.o.   MRN: [id number]  Chief Complaint  Patient presents with  . Diabetes    3 mth  . Hypertension  . Leg Pain  . Urinary Urgency    urinary flow not much output   . Shortness of Breath    with doing yard work    Pt presents to the office today for chronic follow up. He has chronic back pain and is followed by Neurosurgeon. He had a MRI in 01/2020 and is waiting for insurance to approve an injection. He has bilateral leg pain.  Diabetes He presents for his follow-up diabetic visit. He has type 2 diabetes mellitus. His disease course has been improving. There are no hypoglycemic associated symptoms. Associated symptoms include blurred vision and foot paresthesias. Pertinent negatives for diabetes include no chest pain. Symptoms are improving. There are no diabetic complications. Pertinent negatives for diabetic complications include no CVA. Risk factors for coronary artery disease include dyslipidemia, diabetes mellitus, hypertension and sedentary lifestyle. His overall blood glucose range is 130-140 mg/dl. An ACE inhibitor/angiotensin II receptor blocker is being taken. Eye exam is not current.  Hypertension This is a chronic problem. The current episode started more than 1 year ago. The problem has been waxing and waning since onset. The problem is uncontrolled. Associated symptoms include blurred vision and shortness of breath. Pertinent negatives include no chest pain, malaise/fatigue, orthopnea or peripheral edema. Risk factors for coronary artery disease include dyslipidemia, diabetes mellitus and male gender. The current treatment provides moderate improvement. There is no history of CVA or heart failure.  Leg Pain  There was no injury mechanism. The pain is present in the left thigh, right thigh, right leg and left leg. The pain is at a severity of 9/10 (at times, but sitting 5 out 10). The pain is moderate. The  pain has been intermittent since onset. Associated symptoms include numbness and tingling. He reports no foreign bodies present. Exacerbated by: walking. He has tried acetaminophen, non-weight bearing and rest for the symptoms. The treatment provided mild relief.  Shortness of Breath This is a new problem. The current episode started more than 1 month ago. The problem occurs intermittently (with exertion). Associated symptoms include leg pain. Pertinent negatives include no chest pain, orthopnea or sore throat. The symptoms are aggravated by any activity. The treatment provided mild relief. There is no history of a heart failure.  Hyperlipidemia This is a new problem. The current episode started more than 1 month ago. The problem is controlled. Recent lipid tests were reviewed and are normal. Associated symptoms include leg pain and shortness of breath. Pertinent negatives include no chest pain. Current antihyperlipidemic treatment includes statins. The current treatment provides moderate improvement of lipids. Risk factors for coronary artery disease include diabetes mellitus, dyslipidemia, hypertension, male sex and a sedentary lifestyle.  Gastroesophageal Reflux He complains of belching and heartburn. He reports no chest pain or no sore throat. This is a chronic problem. The current episode started more than 1 year ago. The problem occurs occasionally. He has tried a PPI for the symptoms. The treatment provided moderate relief.  Benign Prostatic Hypertrophy This is a chronic problem. The current episode started more than 1 year ago. The problem has been resolved since onset. Irritative symptoms include nocturia (3). Past treatments include tamsulosin. The treatment provided mild relief.  Constipation This is a chronic problem. The problem has been waxing and waning since  onset. His stool frequency is 1 time per day. He has tried nothing for the symptoms. The treatment provided no relief.   Anemia Presents for follow-up visit. There has been no malaise/fatigue. There is no history of heart failure.      Review of Systems  Constitutional: Negative for malaise/fatigue.  HENT: Negative for sore throat.   Eyes: Positive for blurred vision.  Respiratory: Positive for shortness of breath.   Cardiovascular: Negative for chest pain and orthopnea.  Gastrointestinal: Positive for constipation and heartburn.  Genitourinary: Positive for nocturia (3).  Neurological: Positive for tingling and numbness.  All other systems reviewed and are negative.      Objective:   Physical Exam Vitals reviewed.  Constitutional:      General: He is not in acute distress.    Appearance: He is well-developed.  HENT:     Head: Normocephalic.     Right Ear: External ear normal.     Left Ear: External ear normal.  Eyes:     General:        Right eye: No discharge.        Left eye: No discharge.     Pupils: Pupils are equal, round, and reactive to light.  Neck:     Thyroid: No thyromegaly.  Cardiovascular:     Rate and Rhythm: Normal rate and regular rhythm.     Heart sounds: Normal heart sounds. No murmur heard.   Pulmonary:     Effort: Pulmonary effort is normal. No respiratory distress.     Breath sounds: Normal breath sounds. No wheezing.  Abdominal:     General: Bowel sounds are normal. There is no distension.     Palpations: Abdomen is soft.     Tenderness: There is no abdominal tenderness.  Musculoskeletal:        General: No tenderness. Normal range of motion.     Cervical back: Normal range of motion and neck supple.  Skin:    General: Skin is warm and dry.     Findings: No erythema or rash.  Neurological:     Mental Status: He is alert and oriented to person, place, and time.     Cranial Nerves: No cranial nerve deficit.     Deep Tendon Reflexes: Reflexes are normal and symmetric.  Psychiatric:        Behavior: Behavior normal.        Thought Content: Thought content  normal.        Judgment: Judgment normal.       BP (!) 143/80   Pulse 87   Temp (!) 97.2 F (36.2 C) (Temporal)   Ht _0  (1.651 m)   Wt 136 lb 9.6 oz (62 kg)   SpO2 98%   BMI 22.73 kg/m      Assessment & Plan:  Jimmy Craig comes in today with chief complaint of Diabetes (3 mth), Hypertension, Leg Pain, Urinary Urgency (urinary flow not much output ), and Shortness of Breath (with doing yard work )   Diagnosis and orders addressed:  1. Type 2 diabetes mellitus with diabetic polyneuropathy, with long-term current use of insulin (HCC) -Will increase Trulicity to 3 mg from 1.5 mg Strict low carb Needs appt with Almyra Free for diabetic education - Bayer DCA Hb A1c Waived - Ambulatory referral to Ophthalmology - CMP14+EGFR - CBC with Differential/Platelet  2. Need for immunization against influenza - Flu Vaccine QUAD High Dose(Fluad) - CMP14+EGFR - CBC with Differential/Platelet  3. Hypertension associated with diabetes (  [place]) - CMP14+EGFR - CBC with Differential/Platelet  4. GERD without esophagitis - CMP14+EGFR - CBC with Differential/Platelet  5. Hyperlipidemia associated with type 2 diabetes mellitus (HCC) - CMP14+EGFR - CBC with Differential/Platelet - Lipid panel  6. Primary osteoarthritis involving multiple joints - CMP14+EGFR - CBC with Differential/Platelet  7. Benign prostatic hyperplasia with nocturia - CMP14+EGFR - CBC with Differential/Platelet  8. Iron deficiency anemia, unspecified iron deficiency anemia type - CMP14+EGFR - CBC with Differential/Platelet  9. Constipation, unspecified constipation type Will add Miralax today - polyethylene glycol powder (GLYCOLAX/MIRALAX) 17 GM/SCOOP powder; Take 17 g by mouth 2 (two) times daily as needed.  Dispense: 3350 g; Refill: 1 - CMP14+EGFR - CBC with Differential/Platelet  10. SOB (shortness of breath) on exertion - DG Chest 2 View; Future - EKG 12-Lead   Labs pending Health Maintenance  reviewed Diet and exercise encouraged  Follow up plan: 2 months    Evelina Dun, FNP

## 2020-06-02 LAB — CBC WITH DIFFERENTIAL/PLATELET
Basophils Absolute: 0 10*3/uL (ref 0.0–0.2)
Basos: 1 %
EOS (ABSOLUTE): 0.1 10*3/uL (ref 0.0–0.4)
Eos: 3 %
Hematocrit: 36.6 % — ABNORMAL LOW (ref 37.5–51.0)
Hemoglobin: 11.1 g/dL — ABNORMAL LOW (ref 13.0–17.7)
Immature Grans (Abs): 0 10*3/uL (ref 0.0–0.1)
Immature Granulocytes: 0 %
Lymphocytes Absolute: 1.8 10*3/uL (ref 0.7–3.1)
Lymphs: 41 %
MCH: 24.9 pg — ABNORMAL LOW (ref 26.6–33.0)
MCHC: 30.3 g/dL — ABNORMAL LOW (ref 31.5–35.7)
MCV: 82 fL (ref 79–97)
Monocytes Absolute: 0.5 10*3/uL (ref 0.1–0.9)
Monocytes: 12 %
Neutrophils Absolute: 1.9 10*3/uL (ref 1.4–7.0)
Neutrophils: 43 %
Platelets: 289 10*3/uL (ref 150–450)
RBC: 4.46 x10E6/uL (ref 4.14–5.80)
RDW: 14.1 % (ref 11.6–15.4)
WBC: 4.3 10*3/uL (ref 3.4–10.8)

## 2020-06-02 LAB — CMP14+EGFR
ALT: 25 IU/L (ref 0–44)
AST: 21 IU/L (ref 0–40)
Albumin/Globulin Ratio: 1.5 (ref 1.2–2.2)
Albumin: 4.2 g/dL (ref 3.8–4.8)
Alkaline Phosphatase: 92 IU/L (ref 44–121)
BUN/Creatinine Ratio: 22 (ref 10–24)
BUN: 17 mg/dL (ref 8–27)
Bilirubin Total: 0.2 mg/dL (ref 0.0–1.2)
CO2: 25 mmol/L (ref 20–29)
Calcium: 9 mg/dL (ref 8.6–10.2)
Chloride: 103 mmol/L (ref 96–106)
Creatinine, Ser: 0.76 mg/dL (ref 0.76–1.27)
GFR calc Af Amer: 109 mL/min/{1.73_m2} (ref >59)
GFR calc non Af Amer: 94 mL/min/{1.73_m2} (ref >59)
Globulin, Total: 2.8 g/dL (ref 1.5–4.5)
Glucose: 173 mg/dL — ABNORMAL HIGH (ref 65–99)
Potassium: 4.1 mmol/L (ref 3.5–5.2)
Sodium: 141 mmol/L (ref 134–144)
Total Protein: 7 g/dL (ref 6.0–8.5)

## 2020-06-02 LAB — LIPID PANEL
Chol/HDL Ratio: 3.4 ratio (ref 0.0–5.0)
Cholesterol, Total: 124 mg/dL (ref 100–199)
HDL: 36 mg/dL — ABNORMAL LOW (ref >39)
LDL Chol Calc (NIH): 65 mg/dL (ref 0–99)
Triglycerides: 129 mg/dL (ref 0–149)
VLDL Cholesterol Cal: 23 mg/dL (ref 5–40)

## 2020-06-06 ENCOUNTER — Ambulatory Visit (INDEPENDENT_AMBULATORY_CARE_PROVIDER_SITE_OTHER): Payer: 59 | Admitting: Pharmacist

## 2020-06-06 ENCOUNTER — Other Ambulatory Visit: Payer: Self-pay | Admitting: Family

## 2020-06-06 ENCOUNTER — Other Ambulatory Visit: Payer: Self-pay

## 2020-06-06 DIAGNOSIS — E119 Type 2 diabetes mellitus without complications: Secondary | ICD-10-CM

## 2020-06-06 MED ORDER — TRESIBA FLEXTOUCH 100 UNIT/ML ~~LOC~~ SOPN: 42.0000 [IU] | PEN_INJECTOR | Freq: Every day | SUBCUTANEOUS | 4 refills | Status: DC

## 2020-06-06 NOTE — Progress Notes (Signed)
    06/06/2020 Name: Ferdinando Lodge MRN: [id number] DOB: Jun 02, 1951   S:  78 yoM Presents for diabetes evaluation, education, and management Patient was referred and last seen by Primary Care Provider on 06/01/20.  Insurance coverage/medication affordability: [employer]  Patient reports adherence with medications. . Current diabetes medications include: trulicity, levemir, glimepiride, steglatro, metformin . Current hypertension medications include: lisinopril Goal 130/80 . Current hyperlipidemia medications include: atorvastatin   Patient denies hypoglycemic events.   Patient reported dietary habits: Eats 2-3 meals/day Discussed meal planning options and Plate method for healthy eating . Avoid sugary drinks and desserts . Incorporate balanced protein, non starchy veggies, 1 serving of carbohydrate with each meal . Increase water intake . Increase physical activity as able The patient is asked to make an attempt to improve diet and exercise patterns to aid in medical management of this problem.  Patient-reported exercise habits: n/a, encouraged   O:  Lab Results  Component Value Date   HGBA1C 8.7 (H) 06/01/2020    Lipid Panel     Component Value Date/Time   CHOL 124 06/01/2020 1525   TRIG 129 06/01/2020 1525   HDL 36 (L) 06/01/2020 1525   CHOLHDL 3.4 06/01/2020 1525   LDLCALC 65 06/01/2020 1525     Home fasting blood sugars: 99  2 hour post-meal/random blood sugars: n/a.   A/P:  Diabetes T2DM currently uncontrolled. Patient is adherent with medication. Control is suboptimal due to diet/lifestyle.  -Switch levemir to tresiba 42 units every morning  -Continue metformin  -Increased dose of GLP-1 TRULICITY (generic name DULATGLUTIDE) to 3MG  SQ WEEKLY.  -Continued SGLT2-I STEGLATRO (generic name ERTUGLIFLOZIN)   Counseled Sick day rules: if sick, vomiting, have diarrhea, or  cannot drink enough fluids, you should stop taking SGLT-2 inhibitors until your  symptoms go away. In rare cases, these medicines can cause diabetic ketoacidosis (DKA). DKA is acid buildup in the blood.  -Discontinued glimepiride  -Extensively discussed pathophysiology of diabetes, recommended lifestyle interventions, dietary effects on blood sugar control  -Counseled on s/sx of and management of hypoglycemia  -Next A1C anticipated 3 months.   Written patient instructions provided.  Total time in face to face counseling 30 minutes.   Follow up PCP Clinic Visit in 3 months.    , PharmD, BCPS Clinical Pharmacist, [facility]  II Phone [phone]

## 2020-06-08 ENCOUNTER — Other Ambulatory Visit: Payer: Self-pay | Admitting: Family

## 2020-06-08 DIAGNOSIS — E1169 Type 2 diabetes mellitus with other specified complication: Secondary | ICD-10-CM

## 2020-06-08 DIAGNOSIS — E785 Hyperlipidemia, unspecified: Secondary | ICD-10-CM

## 2020-06-26 ENCOUNTER — Ambulatory Visit: Payer: 59 | Admitting: Family

## 2020-06-27 ENCOUNTER — Encounter: Payer: Self-pay | Admitting: Family

## 2020-06-27 ENCOUNTER — Other Ambulatory Visit: Payer: Self-pay | Admitting: Family

## 2020-06-27 DIAGNOSIS — M79604 Pain in right leg: Secondary | ICD-10-CM

## 2020-07-04 DIAGNOSIS — R030 Elevated blood-pressure reading, without diagnosis of hypertension: Secondary | ICD-10-CM | POA: Insufficient documentation

## 2020-07-07 ENCOUNTER — Other Ambulatory Visit: Payer: Self-pay | Admitting: Family

## 2020-07-07 DIAGNOSIS — E785 Hyperlipidemia, unspecified: Secondary | ICD-10-CM

## 2020-07-18 ENCOUNTER — Ambulatory Visit (INDEPENDENT_AMBULATORY_CARE_PROVIDER_SITE_OTHER): Payer: 59 | Admitting: Pharmacist

## 2020-07-18 ENCOUNTER — Other Ambulatory Visit: Payer: Self-pay

## 2020-07-18 DIAGNOSIS — E119 Type 2 diabetes mellitus without complications: Secondary | ICD-10-CM | POA: Diagnosis not present

## 2020-07-18 NOTE — Progress Notes (Signed)
    07/18/2020 Name: Jimmy Craig MRN: [id number] DOB: 1951-01-20   S:  86 yoM Presents for diabetes evaluation, education, and management. Patient was referred and last seen by Primary Care Provider on 06/01/20.  Patient works 3rd shift which makes [employer]: [employer]  Patient reports adherence with medications.  Current diabetes medications include: trulicity, tresiba, steglatro, metformin  Current hypertension medications include: lisinopril Goal 130/80  Current hyperlipidemia medications include: atorvastatin   Patient denies hypoglycemic events   Patient reported dietary habits: Eats 2-3 meals/day Discussed meal planning options and Plate method for healthy eating . Avoid sugary drinks and desserts . Incorporate balanced protein, non starchy veggies, 1 serving of carbohydrate with each meal . Increase water intake . Increase physical activity as able  Patient-reported exercise habits: n/a, having left leg pain  Patient reports visual changes.  Patient reports self foot exams.    O:  Lab Results  Component Value Date   HGBA1C 8.7 (H) 06/01/2020    There were no vitals filed for this visit.     Lipid Panel     Component Value Date/Time   CHOL 124 06/01/2020 1525   TRIG 129 06/01/2020 1525   HDL 36 (L) 06/01/2020 1525   CHOLHDL 3.4 06/01/2020 1525   LDLCALC 65 06/01/2020 1525    Home fasting blood sugars: 100-150, has gotten as high as 205 when he went without medicine  2 hour post-meal/random blood sugars: n/a.   A/P:  Diabetes T2DM currently uncontrolled, however patient reports he feels better now and his sugars are coming down.   -Patient has not had eyes checked in 2 years--will send message to PCP/RN  -Patient did not bring in glucometer.  He reports he is self-adjusting Insulin (and trulicity) at home based on what he is eating. Encouraged patient to continue on insulin and  trulicity as directed.  We will make adjustments in [place] pending patient's BG readings (encouraged to bring meter, he is not checking daily) and A1c.  -Continue Tresiba 42 units sq every morning (9am)  -Continue metformin-GFR94  -Continue dose of GLP-1 TRULICITY (generic name DULATGLUTIDE) to 3MG  SQ WEEKLY.  -Continued SGLT2-I STEGLATRO 15mg  daily (generic name ERTUGLIFLOZIN)              CounseledSick day rules: ifsick, vomiting, have diarrhea, or cannot drink enough fluids, you should stop taking SGLT-2 inhibitors until your symptoms go away. In rare cases, these medicines can cause diabetic ketoacidosis (DKA). DKA is acid buildup in the blood.  -Extensively discussed pathophysiology of diabetes, recommended lifestyle interventions, dietary effects on blood sugar control  -Counseled on s/sx of and management of hypoglycemia  -Next A1C anticipated 09/01/20.  Written patient instructions provided.  Total time in face to face counseling 30 minutes.   Follow up PCP Clinic Visit ON 09/01/20.    09/03/20, PharmD, BCPS Clinical Pharmacist, [facility]  II Phone [phone]

## 2020-08-08 ENCOUNTER — Other Ambulatory Visit: Payer: Self-pay | Admitting: Family

## 2020-08-08 DIAGNOSIS — N401 Enlarged prostate with lower urinary tract symptoms: Secondary | ICD-10-CM

## 2020-08-08 DIAGNOSIS — M150 Primary generalized (osteo)arthritis: Secondary | ICD-10-CM

## 2020-08-08 DIAGNOSIS — R3916 Straining to void: Secondary | ICD-10-CM

## 2020-08-08 DIAGNOSIS — M159 Polyosteoarthritis, unspecified: Secondary | ICD-10-CM

## 2020-08-08 DIAGNOSIS — M8949 Other hypertrophic osteoarthropathy, multiple sites: Secondary | ICD-10-CM

## 2020-08-08 DIAGNOSIS — M25559 Pain in unspecified hip: Secondary | ICD-10-CM

## 2020-08-31 ENCOUNTER — Telehealth: Payer: Self-pay | Admitting: Cardiology

## 2020-08-31 ENCOUNTER — Encounter: Payer: Self-pay | Admitting: *Deleted

## 2020-08-31 ENCOUNTER — Encounter: Payer: Self-pay | Admitting: Cardiology

## 2020-08-31 ENCOUNTER — Other Ambulatory Visit: Payer: Self-pay

## 2020-08-31 ENCOUNTER — Ambulatory Visit (INDEPENDENT_AMBULATORY_CARE_PROVIDER_SITE_OTHER): Payer: 59 | Admitting: Cardiology

## 2020-08-31 VITALS — BP 150/74 | HR 76 | Ht 65.0 in | Wt 136.8 lb

## 2020-08-31 DIAGNOSIS — E1165 Type 2 diabetes mellitus with hyperglycemia: Secondary | ICD-10-CM

## 2020-08-31 DIAGNOSIS — R0600 Dyspnea, unspecified: Secondary | ICD-10-CM

## 2020-08-31 DIAGNOSIS — R0609 Other forms of dyspnea: Secondary | ICD-10-CM

## 2020-08-31 DIAGNOSIS — E782 Mixed hyperlipidemia: Secondary | ICD-10-CM

## 2020-08-31 NOTE — Telephone Encounter (Signed)
Pre-cert Verification for the following procedure    EXERCISE STRESS ECHO   DATE:  09/14/2020  LOCATION:[facility]

## 2020-08-31 NOTE — Progress Notes (Signed)
Cardiology Office Note  Date: 08/31/2020   ID: Jimmy Craig, DOB April 24, 1951, MRN [id number]  PCP:  Sharion Balloon, FNP  Cardiologist:  Rozann Lesches, MD Electrophysiologist:  None   Chief Complaint  Patient presents with  . Shortness of Breath    History of Present Illness: Jimmy Craig is a 70 y.o. male referred for cardiology consultation by Ms. Hawks NP for the evaluation of dyspnea on exertion.  He tells me that over the last 4 or 5 months he has been experiencing shortness of breath with activity, working outside in his garden, doing other chores around the house, walking across a parking lot.  He does not describe any definite chest pressure, but general sense of fatigue and need to rest.  He has had some trouble with bilateral leg paresthesias, sounds to be related to disc disease and possible neurogenic impingement.  He has seen a neurosurgeon and undergone injections with transient improvement.  Does not describe definitive claudication.  Cardiac risk factors include age and gender, also known type 2 diabetes mellitus and hyperlipidemia.  He underwent cardiac testing through [facility] back in 2016, recalls a normal stress test and echocardiogram at that time.  I am unable to pull these results.  I reviewed his medications which are outlined below, also most recent lab work.  His LDL was 65 most recently.  He works at [employer].  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  He tells me that he has a collection of 800-900 books.  Past Medical History:  Diagnosis Date  . Arthritis   . BPH (benign prostatic hyperplasia)   . Chronic back pain   . GERD (gastroesophageal reflux disease)   . Mixed hyperlipidemia   . Type 2 diabetes mellitus ([place])     Past Surgical History:  Procedure Laterality Date  . CATARACT EXTRACTION Right   . CATARACT EXTRACTION W/PHACO Left 12/20/2013   Procedure:  CATARACT EXTRACTION PHACO AND INTRAOCULAR LENS PLACEMENT (IOC);  Surgeon: Williams Che, MD;  Location: [facility];  Service: Ophthalmology;  Laterality: Left;  CDE: 0.97    Current Outpatient Medications  Medication Sig Dispense Refill  . aspirin EC 81 MG tablet Take 81 mg by mouth daily.    Marland Kitchen atorvastatin (LIPITOR) 40 MG tablet Take 1 tablet by mouth once daily 30 tablet 5  . blood glucose meter kit and supplies Dispense based on patient and insurance preference. Use up to four times daily as directed. (FOR ICD-10 E10.9, E11.9). 1 each 0  . celecoxib (CELEBREX) 200 MG capsule Take 1 capsule by mouth once daily 90 capsule 0  . cetirizine (ZYRTEC) 10 MG tablet Take 1 tablet by mouth once daily 30 tablet 5  . Dulaglutide (TRULICITY) 3 IR/4.8NI SOPN Inject 3 mg as directed once a week. 12 mL 2  . gabapentin (NEURONTIN) 100 MG capsule TAKE 1 CAPSULE BY MOUTH THREE TIMES DAILY 90 capsule 1  . glucose blood (ACCU-CHEK GUIDE) test strip Use 1 strip up to 4 times daily.  E10.9, E11.9 100 each 12  . insulin degludec (TRESIBA FLEXTOUCH) 100 UNIT/ML FlexTouch Pen Inject 42 Units into the skin daily. Discontinue levemir 15 mL 4  . Insulin Pen Needle (PEN NEEDLES) 32G X 4 MM MISC 1 application by Does not apply route daily. 100 each 11  . Lancets (ONETOUCH DELICA PLUS OEVOJJ00X) MISC Apply 1 each topically 4 (four) times daily.    Marland Kitchen  lisinopril (ZESTRIL) 10 MG tablet Take 1 tablet (10 mg total) by mouth daily. 90 tablet 3  . metFORMIN (GLUCOPHAGE) 1000 MG tablet TAKE 1 TABLET BY MOUTH TWICE DAILY WITH A MEAL 180 tablet 0  . Multiple Vitamin (MULTIVITAMIN WITH MINERALS) TABS tablet Take 1 tablet by mouth daily.    . pantoprazole (PROTONIX) 40 MG tablet Take 1 tablet (40 mg total) by mouth daily. 90 tablet 3  . polyethylene glycol powder (GLYCOLAX/MIRALAX) 17 GM/SCOOP powder Take 17 g by mouth 2 (two) times daily as needed. 3350 g 1  . STEGLATRO 15 MG TABS tablet Take 1 tablet by mouth once daily 30 tablet 0   . tamsulosin (FLOMAX) 0.4 MG CAPS capsule Take 1 capsule by mouth once daily 90 capsule 0   No current facility-administered medications for this visit.   Allergies:  Patient has no known allergies.   Social History: The patient  reports that he has never smoked. He has never used smokeless tobacco. He reports that he does not drink alcohol and does not use drugs.   Family History: The patient's family history includes Diabetes in his mother.   ROS: No palpitations or syncope.  Physical Exam: VS:  BP (!) 150/74   Pulse 76   Ht $R'5\' 5"'DV$  (1.651 m)   Wt 136 lb 12.8 oz (62.1 kg)   SpO2 98%   BMI 22.76 kg/m , BMI Body mass index is 22.76 kg/m.  Wt Readings from Last 3 Encounters:  08/31/20 136 lb 12.8 oz (62.1 kg)  06/01/20 136 lb 9.6 oz (62 kg)  02/25/20 139 lb (63 kg)    General: Patient appears comfortable at rest. HEENT: Conjunctiva and lids normal, wearing a mask. Neck: Supple, no elevated JVP or carotid bruits, no thyromegaly. Lungs: Clear to auscultation, nonlabored breathing at rest. Cardiac: Regular rate and rhythm, no S3 or significant systolic murmur, no pericardial rub. Abdomen: Soft, bowel sounds present. Extremities: No pitting edema. Skin: Warm and dry. Musculoskeletal: No kyphosis. Neuropsychiatric: Alert and oriented x3, affect grossly appropriate.  ECG:  An ECG dated 06/01/2020 was personally reviewed today and demonstrated:  Sinus rhythm with left anterior fascicular block, nonspecific T wave changes.  Recent Labwork: 06/01/2020: ALT 25; AST 21; BUN 17; Creatinine, Ser 0.76; Hemoglobin 11.1; Platelets 289; Potassium 4.1; Sodium 141     Component Value Date/Time   CHOL 124 06/01/2020 1525   TRIG 129 06/01/2020 1525   HDL 36 (L) 06/01/2020 1525   CHOLHDL 3.4 06/01/2020 1525   LDLCALC 65 06/01/2020 1525    Other Studies Reviewed Today:  No prior cardiac testing results available for review today.  Assessment and Plan:  1.  Dyspnea on exertion in a  70 year old male with history of type 2 diabetes mellitus and hyperlipidemia.  Recent ECG reviewed showing sinus rhythm with left anterior fascicular block and nonspecific T wave changes.  Symptoms present for the last 4 to 5 months.  He is on reasonable medical therapy including aspirin, Lipitor, lisinopril, and diabetic regimen.  Plan is to proceed with an echocardiogram for cardiac structural assessment and also an exercise Myoview for ischemic evaluation.  2.  Mixed hyperlipidemia, on Lipitor.  Recent LDL 65.  3.  Type 2 diabetes mellitus, followed by PCP.  Last hemoglobin A1c was 8.7%.  Medication Adjustments/Labs and Tests Ordered: Current medicines are reviewed at length with the patient today.  Concerns regarding medicines are outlined above.   Tests Ordered: Orders Placed This Encounter  Procedures  . NM Myocar Multi  W/Spect W/Wall Motion / EF  . ECHOCARDIOGRAM COMPLETE    Medication Changes: No orders of the defined types were placed in this encounter.   Disposition:  Follow up test results.  Signed, Satira Sark, MD, [facility] 08/31/2020 11:19 AM    [facility] [place], [place], [place] [address] Phone: [phone]; Fax: [phone]

## 2020-08-31 NOTE — Patient Instructions (Addendum)
Medication Instructions:   Your physician recommends that you continue on your current medications as directed. Please refer to the Current Medication list given to you today.  Labwork:  Covid test 2-3 days before your stress test. Quarantine after covid test until your stress is completed.  Testing/Procedures: Your physician has requested that you have en exercise stress myoview. For further information please visit [email or web address] Please follow instruction sheet, as given. Your physician has requested that you have an echocardiogram. Echocardiography is a painless test that uses sound waves to create images of your heart. It provides your doctor with information about the size and shape of your heart and how well your heart's chambers and valves are working. This procedure takes approximately one hour. There are no restrictions for this procedure.  Follow-Up:  Your physician recommends that you schedule a follow-up appointment in: pending.   Any Other Special Instructions Will Be Listed Below (If Applicable).  If you need a refill on your cardiac medications before your next appointment, please call your pharmacy.

## 2020-09-01 ENCOUNTER — Encounter: Payer: Self-pay | Admitting: Family

## 2020-09-01 ENCOUNTER — Ambulatory Visit (INDEPENDENT_AMBULATORY_CARE_PROVIDER_SITE_OTHER): Payer: 59 | Admitting: Family

## 2020-09-01 VITALS — BP 136/77 | HR 71 | Temp 96.3°F | Ht 65.0 in | Wt 137.2 lb

## 2020-09-01 DIAGNOSIS — M159 Polyosteoarthritis, unspecified: Secondary | ICD-10-CM

## 2020-09-01 DIAGNOSIS — I152 Hypertension secondary to endocrine disorders: Secondary | ICD-10-CM

## 2020-09-01 DIAGNOSIS — E1159 Type 2 diabetes mellitus with other circulatory complications: Secondary | ICD-10-CM

## 2020-09-01 DIAGNOSIS — K219 Gastro-esophageal reflux disease without esophagitis: Secondary | ICD-10-CM | POA: Diagnosis not present

## 2020-09-01 DIAGNOSIS — M8949 Other hypertrophic osteoarthropathy, multiple sites: Secondary | ICD-10-CM

## 2020-09-01 DIAGNOSIS — N401 Enlarged prostate with lower urinary tract symptoms: Secondary | ICD-10-CM

## 2020-09-01 DIAGNOSIS — M5442 Lumbago with sciatica, left side: Secondary | ICD-10-CM

## 2020-09-01 DIAGNOSIS — D509 Iron deficiency anemia, unspecified: Secondary | ICD-10-CM

## 2020-09-01 DIAGNOSIS — E785 Hyperlipidemia, unspecified: Secondary | ICD-10-CM

## 2020-09-01 DIAGNOSIS — G8929 Other chronic pain: Secondary | ICD-10-CM

## 2020-09-01 DIAGNOSIS — M5441 Lumbago with sciatica, right side: Secondary | ICD-10-CM

## 2020-09-01 DIAGNOSIS — R351 Nocturia: Secondary | ICD-10-CM

## 2020-09-01 DIAGNOSIS — K5900 Constipation, unspecified: Secondary | ICD-10-CM

## 2020-09-01 DIAGNOSIS — E1169 Type 2 diabetes mellitus with other specified complication: Secondary | ICD-10-CM

## 2020-09-01 DIAGNOSIS — Z794 Long term (current) use of insulin: Secondary | ICD-10-CM

## 2020-09-01 DIAGNOSIS — E1142 Type 2 diabetes mellitus with diabetic polyneuropathy: Secondary | ICD-10-CM | POA: Diagnosis not present

## 2020-09-01 MED ORDER — GABAPENTIN 300 MG PO CAPS: 300.0000 mg | ORAL_CAPSULE | Freq: Three times a day (TID) | ORAL | 3 refills | Status: DC

## 2020-09-01 NOTE — Patient Instructions (Signed)
Neuropathic Pain Neuropathic pain is pain caused by damage to the nerves that are responsible for certain sensations in your body (sensory nerves). The pain can be caused by:  Damage to the sensory nerves that send signals to your spinal cord and brain (peripheral nervous system).  Damage to the sensory nerves in your brain or spinal cord (central nervous system). Neuropathic pain can make you more sensitive to pain. Even a minor sensation can feel very painful. This is usually a long-term condition that can be difficult to treat. The type of pain differs from person to person. It may:  Start suddenly (acute), or it may develop slowly and last for a long time (chronic).  Come and go as damaged nerves heal, or it may stay at the same level for years.  Cause emotional distress, loss of sleep, and a lower quality of life. What are the causes? The most common cause of this condition is diabetes. Many other diseases and conditions can also cause neuropathic pain. Causes of neuropathic pain can be classified as:  Toxic. This is caused by medicines and chemicals. The most common cause of toxic neuropathic pain is damage from cancer treatments (chemotherapy).  Metabolic. This can be caused by: ? Diabetes. This is the most common disease that damages the nerves. ? Lack of vitamin B from long-term alcohol abuse.  Traumatic. Any injury that cuts, crushes, or stretches a nerve can cause damage and pain. A common example is feeling pain after losing an arm or leg (phantom limb pain).  Compression-related. If a sensory nerve gets trapped or compressed for a long period of time, the blood supply to the nerve can be cut off.  Vascular. Many blood vessel diseases can cause neuropathic pain by decreasing blood supply and oxygen to nerves.  Autoimmune. This type of pain results from diseases in which the body's defense system (immune system) mistakenly attacks sensory nerves. Examples of autoimmune diseases  that can cause neuropathic pain include lupus and multiple sclerosis.  Infectious. Many types of viral infections can damage sensory nerves and cause pain. Shingles infection is a common cause of this type of pain.  Inherited. Neuropathic pain can be a symptom of many diseases that are passed down through families (genetic). What increases the risk? You are more likely to develop this condition if:  You have diabetes.  You smoke.  You drink too much alcohol.  You are taking certain medicines, including medicines that kill cancer cells (chemotherapy) or that treat immune system disorders. What are the signs or symptoms? The main symptom is pain. Neuropathic pain is often described as:  Burning.  Shock-like.  Stinging.  Hot or cold.  Itching. How is this diagnosed? No single test can diagnose neuropathic pain. It is diagnosed based on:  Physical exam and your symptoms. Your health care provider will ask you about your pain. You may be asked to use a pain scale to describe how bad your pain is.  Tests. These may be done to see if you have a high sensitivity to pain and to help find the cause and location of any sensory nerve damage. They include: ? Nerve conduction studies to test how well nerve signals travel through your sensory nerves (electrodiagnostic testing). ? Stimulating your sensory nerves through electrodes on your skin and measuring the response in your spinal cord and brain (somatosensory evoked potential).  Imaging studies, such as: ? X-rays. ? CT scan. ? MRI. How is this treated? Treatment for neuropathic pain may change   over time. You may need to try different treatment options or a combination of treatments. Some options include:  Treating the underlying cause of the neuropathy, such as diabetes, kidney disease, or vitamin deficiencies.  Stopping medicines that can cause neuropathy, such as chemotherapy.  Medicine to relieve pain. Medicines may  include: ? Prescription or over-the-counter pain medicine. ? Anti-seizure medicine. ? Antidepressant medicines. ? Pain-relieving patches that are applied to painful areas of skin. ? A medicine to numb the area (local anesthetic), which can be injected as a nerve block.  Transcutaneous nerve stimulation. This uses electrical currents to block painful nerve signals. The treatment is painless.  Alternative treatments, such as: ? Acupuncture. ? Meditation. ? Massage. ? Physical therapy. ? Pain management programs. ? Counseling. Follow these instructions at home: Medicines  Take over-the-counter and prescription medicines only as told by your health care provider.  Do not drive or use heavy machinery while taking prescription pain medicine.  If you are taking prescription pain medicine, take actions to prevent or treat constipation. Your health care provider may recommend that you: ? Drink enough fluid to keep your urine pale yellow. ? Eat foods that are high in fiber, such as fresh fruits and vegetables, whole grains, and beans. ? Limit foods that are high in fat and processed sugars, such as fried or sweet foods. ? Take an over-the-counter or prescription medicine for constipation.   Lifestyle  Have a good support system at home.  Consider joining a chronic pain support group.  Do not use any products that contain nicotine or tobacco, such as cigarettes and e-cigarettes. If you need help quitting, ask your health care provider.  Do not drink alcohol.   General instructions  Learn as much as you can about your condition.  Work closely with all your health care providers to find the treatment plan that works best for you.  Ask your health care provider what activities are safe for you.  Keep all follow-up visits as told by your health care provider. This is important. Contact a health care provider if:  Your pain treatments are not working.  You are having side effects  from your medicines.  You are struggling with tiredness (fatigue), mood changes, depression, or anxiety. Summary  Neuropathic pain is pain caused by damage to the nerves that are responsible for certain sensations in your body (sensory nerves).  Neuropathic pain may come and go as damaged nerves heal, or it may stay at the same level for years.  Neuropathic pain is usually a long-term condition that can be difficult to treat. Consider joining a chronic pain support group. This information is not intended to replace advice given to you by your health care provider. Make sure you discuss any questions you have with your health care provider. Document Revised: 11/12/2018 Document Reviewed: 08/08/2017 Elsevier Patient Education  2021 [employer]  

## 2020-09-01 NOTE — Progress Notes (Signed)
Subjective:    Patient ID: Jimmy Craig, male    DOB: 25-Jan-1951, 70 y.o.   MRN: [id number]  Chief Complaint  Patient presents with  . Diabetes  . Numbness  . Hip Pain    Goes down front of leg stops right before knee when he walk.   Pt presents to the office today for chronic follow up. He has chronic back pain and is followed by Neurosurgeon. He had a MRI in 01/2020 and is currently getting steroid injections. He states the injections help a week or so then the pain and numbness return to bilateral leg.  Diabetes He presents for his follow-up diabetic visit. He has type 2 diabetes mellitus. His disease course has been stable. There are no hypoglycemic associated symptoms. Associated symptoms include foot paresthesias. Pertinent negatives for diabetes include no blurred vision. Symptoms are stable. Diabetic complications include peripheral neuropathy. Pertinent negatives for diabetic complications include no CVA or heart disease. Risk factors for coronary artery disease include dyslipidemia, diabetes mellitus, male sex, hypertension and sedentary lifestyle. He is following a generally healthy diet. His overall blood glucose range is 110-130 mg/dl. An ACE inhibitor/angiotensin II receptor blocker is being taken. Eye exam is current.  Gastroesophageal Reflux He complains of belching and heartburn. This is a chronic problem. The current episode started more than 1 year ago. The problem occurs occasionally. The problem has been waxing and waning. He has tried a PPI for the symptoms. The treatment provided moderate relief.  Hypertension This is a chronic problem. The current episode started more than 1 year ago. The problem has been resolved since onset. The problem is controlled. Pertinent negatives include no blurred vision, malaise/fatigue, peripheral edema or shortness of breath. Risk factors for coronary artery disease include dyslipidemia and sedentary lifestyle. The current treatment  provides moderate improvement. There is no history of CVA.  Hyperlipidemia This is a chronic problem. The current episode started more than 1 year ago. Pertinent negatives include no shortness of breath. Current antihyperlipidemic treatment includes statins. The current treatment provides moderate improvement of lipids. Risk factors for coronary artery disease include dyslipidemia, diabetes mellitus, male sex, hypertension and a sedentary lifestyle.  Arthritis Presents for follow-up visit. He complains of pain and stiffness. The symptoms have been stable. Affected locations include the left knee and right knee.  Benign Prostatic Hypertrophy This is a chronic problem. The current episode started more than 1 year ago. Irritative symptoms include nocturia (2-3) and urgency. Obstructive symptoms include an intermittent stream and straining.  Anemia Presents for follow-up visit. There has been no bruising/bleeding easily or malaise/fatigue.  Constipation This is a chronic problem. The current episode started more than 1 year ago. His stool frequency is 2 to 3 times per week. He has tried laxatives for the symptoms. The treatment provided moderate relief.       Review of Systems  Constitutional: Negative for malaise/fatigue.  Eyes: Negative for blurred vision.  Respiratory: Negative for shortness of breath.   Gastrointestinal: Positive for constipation and heartburn.  Genitourinary: Positive for nocturia (2-3) and urgency.  Musculoskeletal: Positive for arthritis and stiffness.  Hematological: Does not bruise/bleed easily.  All other systems reviewed and are negative.      Objective:   Physical Exam Vitals reviewed.  Constitutional:      General: He is not in acute distress.    Appearance: He is well-developed and well-nourished.  HENT:     Head: Normocephalic.     Right Ear: Tympanic membrane normal.  Left Ear: Tympanic membrane normal.     Mouth/Throat:     Mouth: Oropharynx is  clear and moist.  Eyes:     General:        Right eye: No discharge.        Left eye: No discharge.     Pupils: Pupils are equal, round, and reactive to light.  Neck:     Thyroid: No thyromegaly.  Cardiovascular:     Rate and Rhythm: Normal rate and regular rhythm.     Pulses: Intact distal pulses.     Heart sounds: Normal heart sounds. No murmur heard.   Pulmonary:     Effort: Pulmonary effort is normal. No respiratory distress.     Breath sounds: Normal breath sounds. No wheezing.  Abdominal:     General: Bowel sounds are normal. There is no distension.     Palpations: Abdomen is soft.     Tenderness: There is no abdominal tenderness.  Musculoskeletal:        General: No tenderness or edema. Normal range of motion.     Cervical back: Normal range of motion and neck supple.  Skin:    General: Skin is warm and dry.     Findings: No erythema or rash.  Neurological:     Mental Status: He is alert and oriented to person, place, and time.     Cranial Nerves: No cranial nerve deficit.     Deep Tendon Reflexes: Reflexes are normal and symmetric.  Psychiatric:        Mood and Affect: Mood and affect normal.        Behavior: Behavior normal.        Thought Content: Thought content normal.        Judgment: Judgment normal.       BP 136/77   Pulse 71   Temp (!) 96.3 F (35.7 C) (Temporal)   Ht $R'5\' 5"'sV$  (1.651 m)   Wt 137 lb 3.2 oz (62.2 kg)   BMI 22.83 kg/m      Assessment & Plan:  Jimmy Craig comes in today with chief complaint of Diabetes, Numbness, and Hip Pain (Goes down front of leg stops right before knee when he walk.)   Diagnosis and orders addressed:  1. Hypertension associated with diabetes ([place] - CMP14+EGFR - CBC with Differential/Platelet  2. GERD without esophagitis - CMP14+EGFR - CBC with Differential/Platelet  3. Type 2 diabetes mellitus with diabetic polyneuropathy, with long-term current use of insulin ([facility]) - CMP14+EGFR - CBC with  Differential/Platelet - Microalbumin / creatinine urine ratio  4. Hyperlipidemia associated with type 2 diabetes mellitus ([place]) - CMP14+EGFR - CBC with Differential/Platelet - Lipid panel  5. Primary osteoarthritis involving multiple joints - CMP14+EGFR - CBC with Differential/Platelet  6. Benign prostatic hyperplasia with nocturia Will do referral Urologists  - CMP14+EGFR - CBC with Differential/Platelet - PSA, total and free - Ambulatory referral to Urology  7. Constipation, unspecified constipation type - CMP14+EGFR - CBC with Differential/Platelet  8. Iron deficiency anemia, unspecified iron deficiency anemia type - CMP14+EGFR - CBC with Differential/Platelet  9. Chronic bilateral low back pain with bilateral sciatica Will increase gabapentin to 300 mg to 100 mg  - CMP14+EGFR - CBC with Differential/Platelet - gabapentin (NEURONTIN) 300 MG capsule; Take 1 capsule (300 mg total) by mouth 3 (three) times daily.  Dispense: 90 capsule; Refill: 3   Labs pending Health Maintenance reviewed Diet and exercise encouraged  Follow up plan: 3 months    Evelina Dun,  FNP  

## 2020-09-02 LAB — CBC WITH DIFFERENTIAL/PLATELET
Basophils Absolute: 0.1 10*3/uL (ref 0.0–0.2)
Basos: 1 %
EOS (ABSOLUTE): 0.1 10*3/uL (ref 0.0–0.4)
Eos: 2 %
Hematocrit: 36.7 % — ABNORMAL LOW (ref 37.5–51.0)
Hemoglobin: 11.2 g/dL — ABNORMAL LOW (ref 13.0–17.7)
Immature Grans (Abs): 0 10*3/uL (ref 0.0–0.1)
Immature Granulocytes: 0 %
Lymphocytes Absolute: 1.6 10*3/uL (ref 0.7–3.1)
Lymphs: 32 %
MCH: 24.3 pg — ABNORMAL LOW (ref 26.6–33.0)
MCHC: 30.5 g/dL — ABNORMAL LOW (ref 31.5–35.7)
MCV: 80 fL (ref 79–97)
Monocytes Absolute: 0.5 10*3/uL (ref 0.1–0.9)
Monocytes: 11 %
Neutrophils Absolute: 2.8 10*3/uL (ref 1.4–7.0)
Neutrophils: 54 %
Platelets: 302 10*3/uL (ref 150–450)
RBC: 4.6 x10E6/uL (ref 4.14–5.80)
RDW: 16.4 % — ABNORMAL HIGH (ref 11.6–15.4)
WBC: 5.1 10*3/uL (ref 3.4–10.8)

## 2020-09-02 LAB — CMP14+EGFR
ALT: 16 IU/L (ref 0–44)
AST: 16 IU/L (ref 0–40)
Albumin/Globulin Ratio: 1.6 (ref 1.2–2.2)
Albumin: 4.1 g/dL (ref 3.8–4.8)
Alkaline Phosphatase: 81 IU/L (ref 44–121)
BUN/Creatinine Ratio: 26 — ABNORMAL HIGH (ref 10–24)
BUN: 21 mg/dL (ref 8–27)
Bilirubin Total: <0.2 mg/dL (ref 0.0–1.2)
CO2: 24 mmol/L (ref 20–29)
Calcium: 9.5 mg/dL (ref 8.6–10.2)
Chloride: 102 mmol/L (ref 96–106)
Creatinine, Ser: 0.8 mg/dL (ref 0.76–1.27)
GFR calc Af Amer: 107 mL/min/{1.73_m2} (ref >59)
GFR calc non Af Amer: 92 mL/min/{1.73_m2} (ref >59)
Globulin, Total: 2.6 g/dL (ref 1.5–4.5)
Glucose: 131 mg/dL — ABNORMAL HIGH (ref 65–99)
Potassium: 4.3 mmol/L (ref 3.5–5.2)
Sodium: 140 mmol/L (ref 134–144)
Total Protein: 6.7 g/dL (ref 6.0–8.5)

## 2020-09-02 LAB — PSA, TOTAL AND FREE
PSA, Free Pct: 40 %
PSA, Free: 0.08 ng/mL
Prostate Specific Ag, Serum: 0.2 ng/mL (ref 0.0–4.0)

## 2020-09-02 LAB — LIPID PANEL
Chol/HDL Ratio: 2.6 ratio (ref 0.0–5.0)
Cholesterol, Total: 111 mg/dL (ref 100–199)
HDL: 42 mg/dL (ref >39)
LDL Chol Calc (NIH): 49 mg/dL (ref 0–99)
Triglycerides: 108 mg/dL (ref 0–149)
VLDL Cholesterol Cal: 20 mg/dL (ref 5–40)

## 2020-09-02 LAB — MICROALBUMIN / CREATININE URINE RATIO
Creatinine, Urine: 50.8 mg/dL
Microalb/Creat Ratio: <6 mg/g creat (ref 0–29)
Microalbumin, Urine: <3 ug/mL

## 2020-09-05 LAB — SPECIMEN STATUS REPORT

## 2020-09-06 LAB — HGB A1C W/O EAG: Hgb A1c MFr Bld: 7.7 % — ABNORMAL HIGH (ref 4.8–5.6)

## 2020-09-06 LAB — SPECIMEN STATUS REPORT

## 2020-09-07 ENCOUNTER — Other Ambulatory Visit: Payer: Self-pay | Admitting: Family

## 2020-09-12 ENCOUNTER — Other Ambulatory Visit (HOSPITAL_COMMUNITY)
Admission: RE | Admit: 2020-09-12 | Discharge: 2020-09-12 | Disposition: A | Discharge status: Home / Self Care | Payer: 59 | Source: Ambulatory Visit | Attending: Cardiology | Admitting: Cardiology

## 2020-09-12 ENCOUNTER — Other Ambulatory Visit: Payer: Self-pay

## 2020-09-12 DIAGNOSIS — Z01812 Encounter for preprocedural laboratory examination: Secondary | ICD-10-CM | POA: Insufficient documentation

## 2020-09-12 DIAGNOSIS — Z20822 Contact with and (suspected) exposure to covid-19: Secondary | ICD-10-CM | POA: Diagnosis not present

## 2020-09-13 LAB — SARS CORONAVIRUS 2 (TAT 6-24 HRS): SARS Coronavirus 2: NEGATIVE

## 2020-09-14 ENCOUNTER — Encounter (HOSPITAL_BASED_OUTPATIENT_CLINIC_OR_DEPARTMENT_OTHER)
Admission: RE | Admit: 2020-09-14 | Discharge: 2020-09-14 | Disposition: A | Discharge status: Home / Self Care | Payer: 59 | Source: Ambulatory Visit | Attending: Cardiology | Admitting: Cardiology

## 2020-09-14 ENCOUNTER — Other Ambulatory Visit: Payer: Self-pay

## 2020-09-14 ENCOUNTER — Encounter (HOSPITAL_COMMUNITY): Payer: Self-pay

## 2020-09-14 ENCOUNTER — Encounter (HOSPITAL_COMMUNITY)
Admission: RE | Admit: 2020-09-14 | Discharge: 2020-09-14 | Disposition: A | Discharge status: Home / Self Care | Payer: 59 | Source: Ambulatory Visit | Attending: Cardiology | Admitting: Cardiology

## 2020-09-14 ENCOUNTER — Ambulatory Visit (HOSPITAL_BASED_OUTPATIENT_CLINIC_OR_DEPARTMENT_OTHER)
Admission: RE | Admit: 2020-09-14 | Discharge: 2020-09-14 | Disposition: A | Discharge status: Home / Self Care | Payer: 59 | Source: Ambulatory Visit | Attending: Cardiology | Admitting: Cardiology

## 2020-09-14 DIAGNOSIS — R0609 Other forms of dyspnea: Secondary | ICD-10-CM

## 2020-09-14 DIAGNOSIS — R0600 Dyspnea, unspecified: Secondary | ICD-10-CM

## 2020-09-14 LAB — NM MYOCAR MULTI W/SPECT W/WALL MOTION / EF
LV dias vol: 69 mL (ref 62–150)
LV sys vol: 24 mL
Peak HR: 87 {beats}/min
RATE: 0.34
Rest HR: 65 {beats}/min
SDS: 0
SRS: 2
SSS: 2
TID: 0.92

## 2020-09-14 LAB — ECHOCARDIOGRAM COMPLETE
AR max vel: 2.42 cm2
AV Area VTI: 2.72 cm2
AV Area mean vel: 2.41 cm2
AV Mean grad: 3.8 mmHg
AV Peak grad: 6.9 mmHg
Ao pk vel: 1.31 m/s
Area-P 1/2: 3.87 cm2
S' Lateral: 2.6 cm

## 2020-09-14 MED ORDER — TECHNETIUM TC 99M TETROFOSMIN IV KIT
30.0000 | PACK | Freq: Once | INTRAVENOUS | Status: AC | PRN
  Administered 2020-09-14: 32 via INTRAVENOUS

## 2020-09-14 MED ORDER — REGADENOSON 0.4 MG/5ML IV SOLN
INTRAVENOUS | Status: AC
  Administered 2020-09-14: 0.4 mg via INTRAVENOUS
  Filled 2020-09-14: qty 5

## 2020-09-14 MED ORDER — TECHNETIUM TC 99M TETROFOSMIN IV KIT
10.0000 | PACK | Freq: Once | INTRAVENOUS | Status: AC | PRN
  Administered 2020-09-14: 10.2 via INTRAVENOUS

## 2020-09-14 MED ORDER — SODIUM CHLORIDE FLUSH 0.9 % IV SOLN
INTRAVENOUS | Status: AC
  Administered 2020-09-14: 10 mL via INTRAVENOUS
  Filled 2020-09-14: qty 10

## 2020-09-14 NOTE — Progress Notes (Signed)
*  PRELIMINARY RESULTS* Echocardiogram 2D Echocardiogram has been performed.  Stacey Drain 09/14/2020, 12:50 PM

## 2020-09-18 ENCOUNTER — Telehealth: Payer: Self-pay | Admitting: Cardiology

## 2020-09-18 NOTE — Telephone Encounter (Signed)
Lesle Chris, LPN  03/14/1750 0:25 PM EST Back to Top     Notified, copy to pcp.

## 2020-09-18 NOTE — Telephone Encounter (Signed)
New message    Patient calling for results for myocardial perfusion

## 2020-09-18 NOTE — Telephone Encounter (Signed)
COVID TEST -  Jonelle Sidle, MD  09/13/2020 8:11 AM EST      Results reviewed. Negative SARS coronavirus 2 test prior to stress testing.   STRESS TEST -   Jonelle Sidle, MD  09/14/2020 4:52 PM EST      Results reviewed. Please let him know that his stress test was reassuring, does not show any evidence of myocardial scar or ischemia to suggest obstructive CAD as clear cause of his symptoms. Would not anticipate further cardiac testing unless symptoms worsen. Keep follow-up with PCP.   ECHO -   Jonelle Sidle, MD  09/14/2020 4:51 PM EST      Results reviewed. Echocardiogram shows normal LVEF at 60 to 65% with mild diastolic dysfunction (not unusual with increasing age and also type 2 diabetes mellitus). No significant valvular abnormalities to explain symptoms

## 2020-09-26 ENCOUNTER — Encounter: Payer: Self-pay | Admitting: Family

## 2020-09-26 ENCOUNTER — Ambulatory Visit (INDEPENDENT_AMBULATORY_CARE_PROVIDER_SITE_OTHER): Payer: 59 | Admitting: Family

## 2020-09-26 VITALS — BP 130/67 | HR 79 | Temp 98.1°F | Ht 65.0 in

## 2020-09-26 DIAGNOSIS — G8929 Other chronic pain: Secondary | ICD-10-CM

## 2020-09-26 DIAGNOSIS — M5442 Lumbago with sciatica, left side: Secondary | ICD-10-CM | POA: Diagnosis not present

## 2020-09-26 DIAGNOSIS — G5793 Unspecified mononeuropathy of bilateral lower limbs: Secondary | ICD-10-CM | POA: Diagnosis not present

## 2020-09-26 DIAGNOSIS — M5441 Lumbago with sciatica, right side: Secondary | ICD-10-CM | POA: Diagnosis not present

## 2020-09-26 DIAGNOSIS — J019 Acute sinusitis, unspecified: Secondary | ICD-10-CM

## 2020-09-26 MED ORDER — AMOXICILLIN-POT CLAVULANATE 875-125 MG PO TABS: 1.0000 | ORAL_TABLET | Freq: Two times a day (BID) | ORAL | 0 refills | Status: DC

## 2020-09-26 MED ORDER — PREDNISONE 10 MG (21) PO TBPK: ORAL_TABLET | ORAL | 0 refills | Status: DC

## 2020-09-26 NOTE — Progress Notes (Signed)
Subjective:    Patient ID: Jimmy Craig, male    DOB: 11-17-1950, 70 y.o.   MRN: [id number]  Chief Complaint  Patient presents with  . Facial Pain  . Leg Pain    Sinusitis This is a new problem. The current episode started 1 to 4 weeks ago. The problem has been rapidly worsening since onset. There has been no fever. His pain is at a severity of 8/10. Associated symptoms include congestion, headaches (right pain) and sinus pressure. Pertinent negatives include no coughing, ear pain, sneezing or sore throat. Past treatments include acetaminophen. The treatment provided mild relief.  Back Pain This is a chronic problem. The current episode started more than 1 year ago. The problem occurs intermittently. The problem has been waxing and waning since onset. The pain is present in the lumbar spine. The quality of the pain is described as aching. The pain is at a severity of 9/10. The pain is moderate. Associated symptoms include headaches (right pain), numbness and weakness. He has tried bed rest for the symptoms. The treatment provided mild relief.      Review of Systems  HENT: Positive for congestion and sinus pressure. Negative for ear pain, sneezing and sore throat.   Respiratory: Negative for cough.   Musculoskeletal: Positive for back pain.  Neurological: Positive for weakness, numbness and headaches (right pain).  All other systems reviewed and are negative.      Objective:   Physical Exam Vitals reviewed.  Constitutional:      General: He is not in acute distress.    Appearance: He is well-developed and well-nourished.  HENT:     Head: Normocephalic.     Right Ear: Tympanic membrane normal.     Left Ear: Tympanic membrane normal.     Nose: Congestion and rhinorrhea present.     Right Sinus: Maxillary sinus tenderness and frontal sinus tenderness present.     Mouth/Throat:     Mouth: Oropharynx is clear and moist.  Eyes:     General:        Right eye: No discharge.         Left eye: No discharge.     Pupils: Pupils are equal, round, and reactive to light.  Neck:     Thyroid: No thyromegaly.  Cardiovascular:     Rate and Rhythm: Normal rate and regular rhythm.     Pulses: Intact distal pulses.     Heart sounds: Normal heart sounds. No murmur heard.   Pulmonary:     Effort: Pulmonary effort is normal. No respiratory distress.     Breath sounds: Normal breath sounds. No wheezing.  Abdominal:     General: Bowel sounds are normal. There is no distension.     Palpations: Abdomen is soft.     Tenderness: There is no abdominal tenderness.  Musculoskeletal:        General: Tenderness present. No edema.     Cervical back: Normal range of motion and neck supple.  Skin:    General: Skin is warm and dry.     Findings: No erythema or rash.  Neurological:     Mental Status: He is alert and oriented to person, place, and time.     Cranial Nerves: No cranial nerve deficit.     Deep Tendon Reflexes: Reflexes are normal and symmetric.  Psychiatric:        Mood and Affect: Mood and affect normal.        Behavior: Behavior normal.  Thought Content: Thought content normal.        Judgment: Judgment normal.       BP 130/67   Pulse 79   Temp 98.1 F (36.7 C) (Temporal)   Ht 5\' 5"  (1.651 m)   BMI 22.83 kg/m      Assessment & Plan:  Jimmy Craig comes in today with chief complaint of Facial Pain and Leg Pain   Diagnosis and orders addressed:  1. Acute sinusitis, recurrence not specified, unspecified location - Take meds as prescribed - Use a cool mist humidifier  -Use saline nose sprays frequently -Force fluids -For any cough or congestion  Use plain Mucinex- regular strength or max strength is fine -For fever or aces or pains- take tylenol or ibuprofen. -Throat lozenges if help -RTO if symptoms worsen or do not improve  - amoxicillin-clavulanate (AUGMENTIN) 875-125 MG tablet; Take 1 tablet by mouth 2 (two) times daily.  Dispense: 14  tablet; Refill: 0  2. Neuropathy involving both lower extremities Keep appt with Neurosurgeon  - predniSONE (STERAPRED UNI-PAK 21 TAB) 10 MG (21) TBPK tablet; Use as directed  Dispense: 21 tablet; Refill: 0  3. Chronic bilateral low back pain with bilateral sciatica - predniSONE (STERAPRED UNI-PAK 21 TAB) 10 MG (21) TBPK tablet; Use as directed  Dispense: 21 tablet; Refill: 0   Nyoka Cowden, FNP

## 2020-09-26 NOTE — Patient Instructions (Signed)
Sinusitis, Adult Sinusitis is inflammation of your sinuses. Sinuses are hollow spaces in the bones around your face. Your sinuses are located:  Around your eyes.  In the middle of your forehead.  Behind your nose.  In your cheekbones. Mucus normally drains out of your sinuses. When your nasal tissues become inflamed or swollen, mucus can become trapped or blocked. This allows bacteria, viruses, and fungi to grow, which leads to infection. Most infections of the sinuses are caused by a virus. Sinusitis can develop quickly. It can last for up to 4 weeks (acute) or for more than 12 weeks (chronic). Sinusitis often develops after a cold. What are the causes? This condition is caused by anything that creates swelling in the sinuses or stops mucus from draining. This includes:  Allergies.  Asthma.  Infection from bacteria or viruses.  Deformities or blockages in your nose or sinuses.  Abnormal growths in the nose (nasal polyps).  Pollutants, such as chemicals or irritants in the air.  Infection from fungi (rare). What increases the risk? You are more likely to develop this condition if you:  Have a weak body defense system (immune system).  Do a lot of swimming or diving.  Overuse nasal sprays.  Smoke. What are the signs or symptoms? The main symptoms of this condition are pain and a feeling of pressure around the affected sinuses. Other symptoms include:  Stuffy nose or congestion.  Thick drainage from your nose.  Swelling and warmth over the affected sinuses.  Headache.  Upper toothache.  A cough that may get worse at night.  Extra mucus that collects in the throat or the back of the nose (postnasal drip).  Decreased sense of smell and taste.  Fatigue.  A fever.  Sore throat.  Bad breath. How is this diagnosed? This condition is diagnosed based on:  Your symptoms.  Your medical history.  A physical exam.  Tests to find out if your condition is  acute or chronic. This may include: ? Checking your nose for nasal polyps. ? Viewing your sinuses using a device that has a light (endoscope). ? Testing for allergies or bacteria. ? Imaging tests, such as an MRI or CT scan. In rare cases, a bone biopsy may be done to rule out more serious types of fungal sinus disease. How is this treated? Treatment for sinusitis depends on the cause and whether your condition is chronic or acute.  If caused by a virus, your symptoms should go away on their own within 10 days. You may be given medicines to relieve symptoms. They include: ? Medicines that shrink swollen nasal passages (topical intranasal decongestants). ? Medicines that treat allergies (antihistamines). ? A spray that eases inflammation of the nostrils (topical intranasal corticosteroids). ? Rinses that help get rid of thick mucus in your nose (nasal saline washes).  If caused by bacteria, your health care provider may recommend waiting to see if your symptoms improve. Most bacterial infections will get better without antibiotic medicine. You may be given antibiotics if you have: ? A severe infection. ? A weak immune system.  If caused by narrow nasal passages or nasal polyps, you may need to have surgery. Follow these instructions at home: Medicines  Take, use, or apply over-the-counter and prescription medicines only as told by your health care provider. These may include nasal sprays.  If you were prescribed an antibiotic medicine, take it as told by your health care provider. Do not stop taking the antibiotic even if you start   to feel better. Hydrate and humidify  Drink enough fluid to keep your urine pale yellow. Staying hydrated will help to thin your mucus.  Use a cool mist humidifier to keep the humidity level in your home above 50%.  Inhale steam for 10-15 minutes, 3-4 times a day, or as told by your health care provider. You can do this in the bathroom while a hot shower is  running.  Limit your exposure to cool or dry air.   Rest  Rest as much as possible.  Sleep with your head raised (elevated).  Make sure you get enough sleep each night. General instructions  Apply a warm, moist washcloth to your face 3-4 times a day or as told by your health care provider. This will help with discomfort.  Wash your hands often with soap and water to reduce your exposure to germs. If soap and water are not available, use hand sanitizer.  Do not smoke. Avoid being around people who are smoking (secondhand smoke).  Keep all follow-up visits as told by your health care provider. This is important.   Contact a health care provider if:  You have a fever.  Your symptoms get worse.  Your symptoms do not improve within 10 days. Get help right away if:  You have a severe headache.  You have persistent vomiting.  You have severe pain or swelling around your face or eyes.  You have vision problems.  You develop confusion.  Your neck is stiff.  You have trouble breathing. Summary  Sinusitis is soreness and inflammation of your sinuses. Sinuses are hollow spaces in the bones around your face.  This condition is caused by nasal tissues that become inflamed or swollen. The swelling traps or blocks the flow of mucus. This allows bacteria, viruses, and fungi to grow, which leads to infection.  If you were prescribed an antibiotic medicine, take it as told by your health care provider. Do not stop taking the antibiotic even if you start to feel better.  Keep all follow-up visits as told by your health care provider. This is important. This information is not intended to replace advice given to you by your health care provider. Make sure you discuss any questions you have with your health care provider. Document Revised: 12/22/2017 Document Reviewed: 12/22/2017 [employer] Patient Education  2021 [employer]  

## 2020-09-27 ENCOUNTER — Other Ambulatory Visit: Payer: Self-pay | Admitting: Family

## 2020-10-06 ENCOUNTER — Encounter: Payer: Self-pay | Admitting: Family Medicine

## 2020-10-06 ENCOUNTER — Ambulatory Visit (INDEPENDENT_AMBULATORY_CARE_PROVIDER_SITE_OTHER): Payer: 59 | Admitting: Family Medicine

## 2020-10-06 ENCOUNTER — Other Ambulatory Visit: Payer: Self-pay

## 2020-10-06 VITALS — BP 134/73 | HR 86 | Temp 98.0°F | Resp 20 | Ht 65.0 in | Wt 136.0 lb

## 2020-10-06 DIAGNOSIS — M5441 Lumbago with sciatica, right side: Secondary | ICD-10-CM

## 2020-10-06 DIAGNOSIS — M5442 Lumbago with sciatica, left side: Secondary | ICD-10-CM | POA: Diagnosis not present

## 2020-10-06 DIAGNOSIS — G9519 Other vascular myelopathies: Secondary | ICD-10-CM

## 2020-10-06 DIAGNOSIS — G459 Transient cerebral ischemic attack, unspecified: Secondary | ICD-10-CM | POA: Diagnosis not present

## 2020-10-06 DIAGNOSIS — G8929 Other chronic pain: Secondary | ICD-10-CM | POA: Diagnosis not present

## 2020-10-06 MED ORDER — ASPIRIN EC 81 MG PO TBEC: 81.0000 mg | DELAYED_RELEASE_TABLET | Freq: Every day | ORAL | 11 refills | Status: DC

## 2020-10-06 MED ORDER — CLOPIDOGREL BISULFATE 75 MG PO TABS: 75.0000 mg | ORAL_TABLET | Freq: Every day | ORAL | 5 refills | Status: DC

## 2020-10-06 MED ORDER — BETAMETHASONE SOD PHOS & ACET 6 (3-3) MG/ML IJ SUSP
6.0000 mg | Freq: Once | INTRAMUSCULAR | Status: AC
Start: 2020-10-06 — End: 2020-10-06
  Administered 2020-10-06: 6 mg via INTRAMUSCULAR

## 2020-10-06 MED ORDER — GABAPENTIN 300 MG PO CAPS
600.0000 mg | ORAL_CAPSULE | Freq: Two times a day (BID) | ORAL | 3 refills | Status: DC
Start: 2020-10-06 — End: 2021-03-05

## 2020-10-06 NOTE — Progress Notes (Signed)
Subjective:  Patient ID: Jimmy Craig, male    DOB: [birth year]  Age: 70 y.o. MRN: [id number]  CC: Left arm pain/numbness   HPI Jimmy Craig presents for after walking 5 minutes experiencing numbness in the medial shins and right thigh. HE gets pain at the  Left lower side that radiates to the thigh and shin. Works at [employer] and [employer]. Sits on a hard chair or stands.  Sudden onset LUE weakness with left face weakness for 1/2 hour 2 evenings ago at work. Had similar sx in the left hand twide in the last month.    Depression screen [facility] 2/9 10/06/2020 09/26/2020 09/01/2020  Decreased Interest 0 0 0  Down, Depressed, Hopeless 0 0 0  PHQ - 2 Score 0 0 0    History Jimmy Craig has a past medical history of Arthritis, BPH (benign prostatic hyperplasia), Chronic back pain, GERD (gastroesophageal reflux disease), Mixed hyperlipidemia, and Type 2 diabetes mellitus ([place]).   He has a past surgical history that includes Cataract extraction (Right) and Cataract extraction w/PHACO (Left, 12/20/2013).   His family history includes Diabetes in his mother.He reports that he has never smoked. He has never used smokeless tobacco. He reports that he does not drink alcohol and does not use drugs.    ROS Review of Systems  Constitutional: Negative for fever.  Respiratory: Negative for shortness of breath.   Cardiovascular: Negative for chest pain.  Musculoskeletal: Negative for arthralgias.  Skin: Negative for rash.  Neurological: Positive for facial asymmetry, weakness and numbness.    Objective:  BP 134/73   Pulse 86   Temp 98 F (36.7 C) (Temporal)   Resp 20   Ht $R'5\' 5"'[place]$  (1.651 m)   Wt 136 lb (61.7 kg)   SpO2 100%   BMI 22.63 kg/m   BP Readings from Last 3 Encounters:  10/06/20 134/73  09/26/20 130/67  09/01/20 136/77    Wt Readings from Last 3 Encounters:  10/06/20 136 lb (61.7 kg)  09/01/20 137 lb 3.2 oz (62.2 kg)  08/31/20 136 lb 12.8 oz (62.1 kg)     Physical Exam Vitals  reviewed.  Constitutional:      Appearance: He is well-developed and well-nourished.  HENT:     Head: Normocephalic and atraumatic.     Right Ear: Tympanic membrane and external ear normal. No decreased hearing noted.     Left Ear: Tympanic membrane and external ear normal. No decreased hearing noted.     Mouth/Throat:     Pharynx: No oropharyngeal exudate or posterior oropharyngeal erythema.  Eyes:     Pupils: Pupils are equal, round, and reactive to light.  Cardiovascular:     Rate and Rhythm: Normal rate and regular rhythm.     Heart sounds: No murmur heard.   Pulmonary:     Effort: No respiratory distress.     Breath sounds: Normal breath sounds.  Abdominal:     General: Bowel sounds are normal.     Palpations: Abdomen is soft. There is no mass.     Tenderness: There is no abdominal tenderness.  Musculoskeletal:     Cervical back: Normal range of motion and neck supple.       Assessment & Plan:   Jimmy Craig was seen today for left arm pain/numbness.  Diagnoses and all orders for this visit:  TIA (transient ischemic attack) -     US Carotid Bilateral; Future  Chronic bilateral low back pain with bilateral sciatica -     gabapentin (NEURONTIN)  300 MG capsule; Take 2 capsules (600 mg total) by mouth 2 (two) times daily. -     betamethasone acetate-betamethasone sodium phosphate (CELESTONE) injection 6 mg  Neurogenic claudication (HCC)  Other orders -     clopidogrel (PLAVIX) 75 MG tablet; Take 1 tablet (75 mg total) by mouth daily. -     aspirin EC 81 MG tablet; Take 1 tablet (81 mg total) by mouth daily. Swallow whole.       I have discontinued Jimmy Craig's aspirin EC. I have also changed his gabapentin. Additionally, I am having him start on clopidogrel and aspirin EC. Lastly, I am having him maintain his multivitamin with minerals, lisinopril, blood glucose meter kit and supplies, Pen Needles, OneTouch Delica Plus [id number], Accu-Chek Guide, pantoprazole,  polyethylene glycol powder, Trulicity, Tresiba FlexTouch, atorvastatin, celecoxib, tamsulosin, metFORMIN, Steglatro, amoxicillin-clavulanate, and predniSONE. We administered betamethasone acetate-betamethasone sodium phosphate.  Allergies as of 10/06/2020   No Known Allergies     Medication List       Accurate as of October 06, 2020  6:03 PM. If you have any questions, ask your nurse or doctor.        Accu-Chek Guide test strip Generic drug: glucose blood Use 1 strip up to 4 times daily.  E10.9, E11.9   amoxicillin-clavulanate 875-125 MG tablet Commonly known as: AUGMENTIN Take 1 tablet by mouth 2 (two) times daily.   aspirin EC 81 MG tablet Take 1 tablet (81 mg total) by mouth daily. Swallow whole. What changed: additional instructions Changed by: Claretta Fraise, MD   atorvastatin 40 MG tablet Commonly known as: LIPITOR Take 1 tablet by mouth once daily   blood glucose meter kit and supplies Dispense based on patient and insurance preference. Use up to four times daily as directed. (FOR ICD-10 E10.9, E11.9).   celecoxib 200 MG capsule Commonly known as: CELEBREX Take 1 capsule by mouth once daily   clopidogrel 75 MG tablet Commonly known as: Plavix Take 1 tablet (75 mg total) by mouth daily. Started by: Claretta Fraise, MD   gabapentin 300 MG capsule Commonly known as: NEURONTIN Take 2 capsules (600 mg total) by mouth 2 (two) times daily. What changed:   how much to take  when to take this Changed by: Claretta Fraise, MD   lisinopril 10 MG tablet Commonly known as: ZESTRIL Take 1 tablet (10 mg total) by mouth daily.   metFORMIN 1000 MG tablet Commonly known as: GLUCOPHAGE TAKE 1 TABLET BY MOUTH TWICE DAILY WITH A MEAL   multivitamin with minerals Tabs tablet Take 1 tablet by mouth daily.   OneTouch Delica Plus [id number] Misc Apply 1 each topically 4 (four) times daily.   pantoprazole 40 MG tablet Commonly known as: PROTONIX Take 1 tablet (40 mg total) by  mouth daily.   Pen Needles 32G X 4 MM Misc 1 application by Does not apply route daily.   polyethylene glycol powder 17 GM/SCOOP powder Commonly known as: GLYCOLAX/MIRALAX Take 17 g by mouth 2 (two) times daily as needed.   predniSONE 10 MG (21) Tbpk tablet Commonly known as: STERAPRED UNI-PAK 21 TAB Use as directed   Steglatro 15 MG Tabs tablet Generic drug: ertugliflozin L-PyroglutamicAc Take 1 tablet by mouth once daily   tamsulosin 0.4 MG Caps capsule Commonly known as: FLOMAX Take 1 capsule by mouth once daily   Tresiba FlexTouch 100 UNIT/ML FlexTouch Pen Generic drug: insulin degludec Inject 42 Units into the skin daily. Discontinue levemir   Trulicity 3 YQ/0.3KV Sopn Generic drug:  Dulaglutide Inject 3 mg as directed once a week.        Follow-up: Return in about 6 weeks (around 11/17/2020).  Claretta Fraise, M.D.

## 2020-10-09 ENCOUNTER — Ambulatory Visit (INDEPENDENT_AMBULATORY_CARE_PROVIDER_SITE_OTHER): Payer: 59 | Admitting: Urology

## 2020-10-09 ENCOUNTER — Other Ambulatory Visit: Payer: Self-pay

## 2020-10-09 ENCOUNTER — Encounter: Payer: Self-pay | Admitting: Urology

## 2020-10-09 VITALS — BP 125/64 | HR 99 | Temp 98.8°F | Ht 65.0 in | Wt 136.0 lb

## 2020-10-09 DIAGNOSIS — R3912 Poor urinary stream: Secondary | ICD-10-CM

## 2020-10-09 DIAGNOSIS — R351 Nocturia: Secondary | ICD-10-CM | POA: Diagnosis not present

## 2020-10-09 DIAGNOSIS — R3916 Straining to void: Secondary | ICD-10-CM | POA: Diagnosis not present

## 2020-10-09 DIAGNOSIS — N401 Enlarged prostate with lower urinary tract symptoms: Secondary | ICD-10-CM | POA: Diagnosis not present

## 2020-10-09 LAB — URINALYSIS, ROUTINE W REFLEX MICROSCOPIC
Bilirubin, UA: NEGATIVE
Leukocytes,UA: NEGATIVE
Nitrite, UA: NEGATIVE
Protein,UA: NEGATIVE
RBC, UA: NEGATIVE
Specific Gravity, UA: 1.02 (ref 1.005–1.030)
Urobilinogen, Ur: 0.2 mg/dL (ref 0.2–1.0)
pH, UA: 5.5 (ref 5.0–7.5)

## 2020-10-09 MED ORDER — TAMSULOSIN HCL 0.4 MG PO CAPS: 0.4000 mg | ORAL_CAPSULE | Freq: Two times a day (BID) | ORAL | 3 refills | Status: DC

## 2020-10-09 NOTE — Patient Instructions (Signed)

## 2020-10-09 NOTE — Progress Notes (Signed)
10/09/2020 9:05 AM   Jimmy Craig [birth year] [id number]  Referring provider: Sharion Balloon, [address] [place],  [place] [address]  CC: BPH with LUTS   HPI: New pt for BPH and LUTS. His AUASS is 21, dissatisfied. He has bothersome weak stream and feeling of incomplete emptying. He is on tamsulosin. Worse since 2020. Noc x 3. No LE edema. No snoring. No gross hematuria. He has DM and PN. No gross hematuria. Some constipation.  His 01/22 PSA was 0.2. PVR today, 03/22, 7 ml.   He is a [employer] administrator.   PMH: Past Medical History:  Diagnosis Date  . Arthritis   . BPH (benign prostatic hyperplasia)   . Chronic back pain   . GERD (gastroesophageal reflux disease)   . Mixed hyperlipidemia   . Type 2 diabetes mellitus [facility])     Surgical History: Past Surgical History:  Procedure Laterality Date  . CATARACT EXTRACTION Right   . CATARACT EXTRACTION W/PHACO Left 12/20/2013   Procedure: CATARACT EXTRACTION PHACO AND INTRAOCULAR LENS PLACEMENT (IOC);  Surgeon: Williams Che, MD;  Location: [facility];  Service: Ophthalmology;  Laterality: Left;  CDE: 0.97    Home Medications:  Allergies as of 10/09/2020   No Known Allergies     Medication List       Accurate as of October 09, 2020  9:05 AM. If you have any questions, ask your nurse or doctor.        STOP taking these medications   amoxicillin-clavulanate 875-125 MG tablet Commonly known as: AUGMENTIN Stopped by: Festus Aloe, MD     TAKE these medications   Accu-Chek Guide test strip Generic drug: glucose blood Use 1 strip up to 4 times daily.  E10.9, E11.9   aspirin EC 81 MG tablet Take 1 tablet (81 mg total) by mouth daily. Swallow whole.   atorvastatin 40 MG tablet Commonly known as: LIPITOR Take 1 tablet by mouth once daily   blood glucose meter kit and supplies Dispense based on patient and insurance preference. Use up to four times daily as directed. (FOR ICD-10 E10.9, E11.9).   celecoxib 200 MG  capsule Commonly known as: CELEBREX Take 1 capsule by mouth once daily   clopidogrel 75 MG tablet Commonly known as: Plavix Take 1 tablet (75 mg total) by mouth daily.   gabapentin 300 MG capsule Commonly known as: NEURONTIN Take 2 capsules (600 mg total) by mouth 2 (two) times daily.   lisinopril 10 MG tablet Commonly known as: ZESTRIL Take 1 tablet (10 mg total) by mouth daily.   metFORMIN 1000 MG tablet Commonly known as: GLUCOPHAGE TAKE 1 TABLET BY MOUTH TWICE DAILY WITH A MEAL   multivitamin with minerals Tabs tablet Take 1 tablet by mouth daily.   OneTouch Delica Plus [id number] Misc Apply 1 each topically 4 (four) times daily.   pantoprazole 40 MG tablet Commonly known as: PROTONIX Take 1 tablet (40 mg total) by mouth daily.   Pen Needles 32G X 4 MM Misc 1 application by Does not apply route daily.   polyethylene glycol powder 17 GM/SCOOP powder Commonly known as: GLYCOLAX/MIRALAX Take 17 g by mouth 2 (two) times daily as needed.   predniSONE 10 MG (21) Tbpk tablet Commonly known as: STERAPRED UNI-PAK 21 TAB Use as directed   Steglatro 15 MG Tabs tablet Generic drug: ertugliflozin L-PyroglutamicAc Take 1 tablet by mouth once daily   tamsulosin 0.4 MG Caps capsule Commonly known as: FLOMAX Take 1 capsule by mouth once  daily   Tresiba FlexTouch 100 UNIT/ML FlexTouch Pen Generic drug: insulin degludec Inject 42 Units into the skin daily. Discontinue levemir   Trulicity 3 QI/3.4VQ Sopn Generic drug: Dulaglutide Inject 3 mg as directed once a week.       Allergies: No Known Allergies  Family History: Family History  Problem Relation Age of Onset  . Diabetes Mother     Social History:  reports that he has never smoked. He has never used smokeless tobacco. He reports that he does not drink alcohol and does not use drugs.   Physical Exam: There were no vitals taken for this visit.  Constitutional:  Alert and oriented, No acute distress. HEENT:  [place] AT, moist mucus membranes.  Trachea midline, no masses. Cardiovascular: No clubbing, cyanosis, or edema. Respiratory: Normal respiratory effort, no increased work of breathing. GI: Abdomen is soft, nontender, nondistended, no abdominal masses GU: No CVA tenderness Lymph: No cervical or inguinal lymphadenopathy. Skin: No rashes, bruises or suspicious lesions. Neurologic: Grossly intact, no focal deficits, moving all 4 extremities. Psychiatric: Normal mood and affect. DRE: prostate 25 g, smooth, no hard area or nodule   Laboratory Data: Lab Results  Component Value Date   WBC 5.1 09/01/2020   HGB 11.2 (L) 09/01/2020   HCT 36.7 (L) 09/01/2020   MCV 80 09/01/2020   PLT 302 09/01/2020    Lab Results  Component Value Date   CREATININE 0.80 09/01/2020    No results found for: PSA  No results found for: TESTOSTERONE  Lab Results  Component Value Date   HGBA1C 7.7 (H) 09/01/2020    Urinalysis No results found for: COLORURINE, APPEARANCEUR, LABSPEC, PHURINE, GLUCOSEU, HGBUR, BILIRUBINUR, KETONESUR, PROTEINUR, UROBILINOGEN, NITRITE, LEUKOCYTESUR  Lab Results  Component Value Date   LABMICR <3.0 09/01/2020    Pertinent Imaging: N/a No results found for this or any previous visit.  No results found for this or any previous visit.  No results found for this or any previous visit.  No results found for this or any previous visit.  No results found for this or any previous visit.  No results found for this or any previous visit.  No results found for this or any previous visit.  No results found for this or any previous visit.   Assessment & Plan:    1. Benign prostatic hyperplasia with nocturia - discussed the nature of BPH and nature r/b of alpha blocker, 5ari, OAB meds and procedures. Avoid bladder irritants. Discussed role of cystoscopy. We will increase tamsulosin to BID and then consider adding low dose oxybutynin QHS if this doesn't help.     No  follow-ups on file.  Festus Aloe, MD

## 2020-10-09 NOTE — Progress Notes (Signed)
Bladder Scan Patient can void: 7 ml Performed By: Bridgette Habermann, lpn   Urological Symptom Review  Patient is experiencing the following symptoms: Frequent urination Hard to postpone urination Get up at night to urinate Stream starts and stops Weak stream Erection problems (male only)   Review of Systems  Gastrointestinal (upper)  : Negative for upper GI symptoms  Gastrointestinal (lower) : Negative for lower GI symptoms  Constitutional : Negative for symptoms  Skin: Negative for skin symptoms  Eyes: Negative for eye symptoms  Ear/Nose/Throat : Negative for Ear/Nose/Throat symptoms  Hematologic/Lymphatic: Negative for Hematologic/Lymphatic symptoms  Cardiovascular : Negative for cardiovascular symptoms  Respiratory : Negative for respiratory symptoms  Endocrine: Negative for endocrine symptoms  Musculoskeletal: Negative for musculoskeletal symptoms  Neurological: Negative for neurological symptoms  Psychologic: Negative for psychiatric symptoms

## 2020-10-13 ENCOUNTER — Telehealth: Payer: Self-pay

## 2020-10-13 ENCOUNTER — Other Ambulatory Visit: Payer: Self-pay | Admitting: Family

## 2020-10-13 DIAGNOSIS — R519 Headache, unspecified: Secondary | ICD-10-CM

## 2020-10-13 DIAGNOSIS — J019 Acute sinusitis, unspecified: Secondary | ICD-10-CM

## 2020-10-13 NOTE — Telephone Encounter (Signed)
lmtcb

## 2020-10-13 NOTE — Telephone Encounter (Signed)
Patient seen Jimmy Craig on 2/22 for facial pain.  Patient states his facial pain is back again.  Do you want him to come back in ? Please review and advise

## 2020-10-13 NOTE — Telephone Encounter (Signed)
I have placed a referral to ENT

## 2020-10-16 ENCOUNTER — Other Ambulatory Visit: Payer: Self-pay | Admitting: Family Medicine

## 2020-10-16 DIAGNOSIS — E1169 Type 2 diabetes mellitus with other specified complication: Secondary | ICD-10-CM

## 2020-10-16 MED ORDER — DOXYCYCLINE HYCLATE 100 MG PO TABS: 100.0000 mg | ORAL_TABLET | Freq: Two times a day (BID) | ORAL | 0 refills | Status: DC

## 2020-10-16 MED ORDER — ACCU-CHEK GUIDE VI STRP: ORAL_STRIP | 12 refills | Status: DC

## 2020-10-16 NOTE — Telephone Encounter (Signed)
PAtient is calling Christy's nurse back about message he left

## 2020-10-16 NOTE — Telephone Encounter (Signed)
Doxycycline Prescription sent to pharmacy   

## 2020-10-16 NOTE — Telephone Encounter (Signed)
Test strip sent to [facility]. Patient still complaining of left side facial pain when he was on the abx it went away but is now back. Please advise.

## 2020-10-16 NOTE — Telephone Encounter (Signed)
Patient aware and verbalized understanding. °

## 2020-10-16 NOTE — Addendum Note (Signed)
Addended by: Jannifer Rodney A on: 10/16/2020 03:10 PM   Modules accepted: Orders

## 2020-10-17 ENCOUNTER — Ambulatory Visit (HOSPITAL_COMMUNITY)
Admission: RE | Admit: 2020-10-17 | Discharge: 2020-10-17 | Disposition: A | Discharge status: Home / Self Care | Payer: 59 | Source: Ambulatory Visit | Attending: Family Medicine | Admitting: Family Medicine

## 2020-10-17 ENCOUNTER — Other Ambulatory Visit: Payer: Self-pay

## 2020-10-17 DIAGNOSIS — G459 Transient cerebral ischemic attack, unspecified: Secondary | ICD-10-CM | POA: Insufficient documentation

## 2020-10-17 IMAGING — US US CAROTID DUPLEX BILAT
1 series · 13 of 24 positions shown · non-contrast
Comparison: None.

CLINICAL DATA: 69-year-old male with a history of carotid bruit

EXAM:
BILATERAL CAROTID DUPLEX ULTRASOUND
TECHNIQUE: Gray scale imaging, color Doppler and duplex ultrasound were
performed of bilateral carotid and vertebral arteries in the neck.

[Series 1: us carotid bilateral · 13 of 73 slices shown]
[im 1/73]
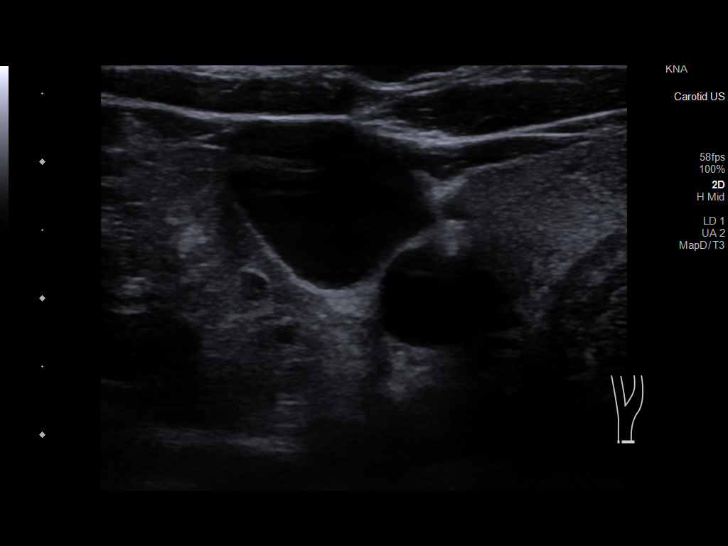
[im 7/73]
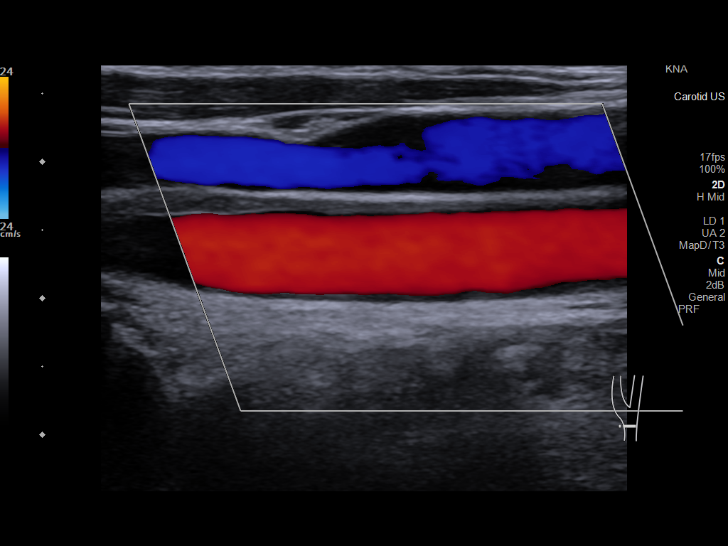
[im 13/73]
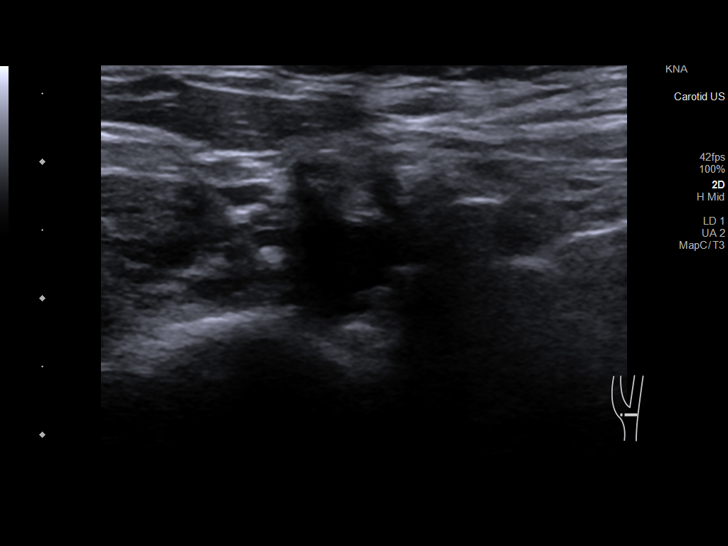
[im 19/73]
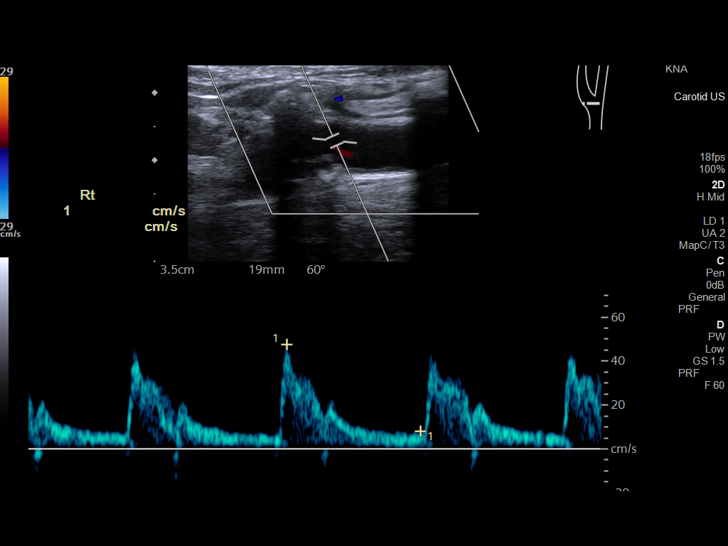
[im 26/73]
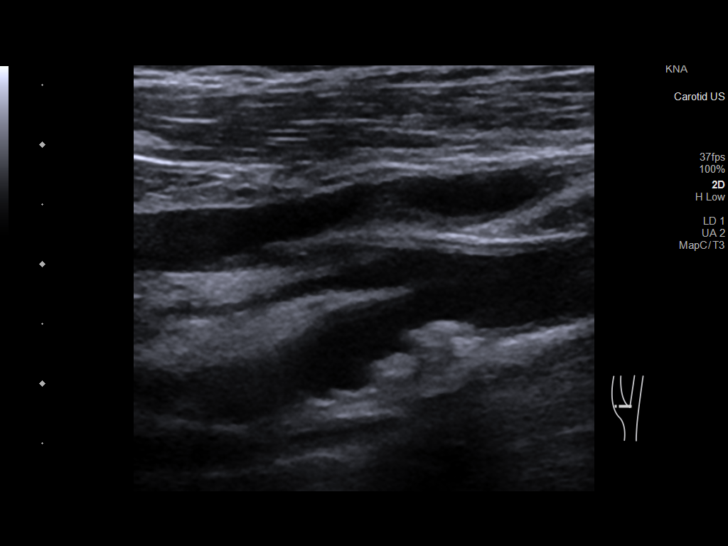
[im 32/73]
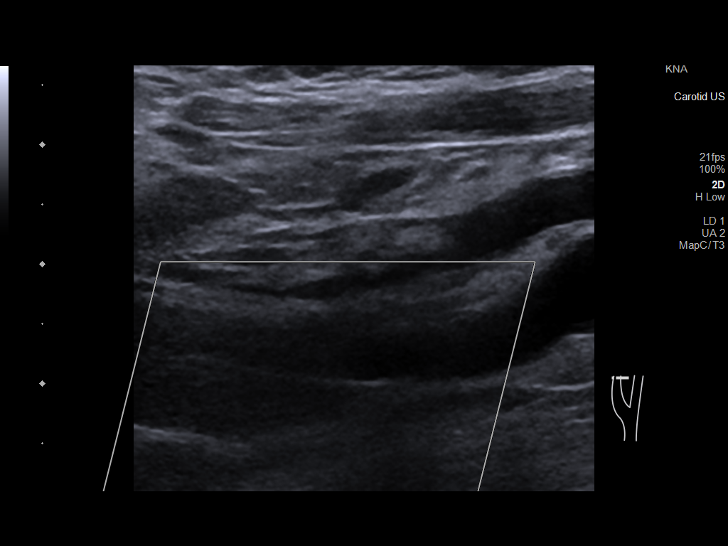
[im 38/73]
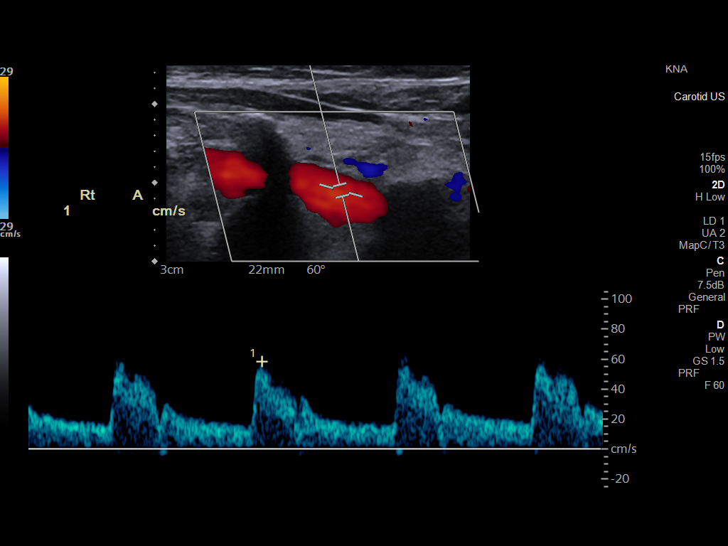
[im 41/73]
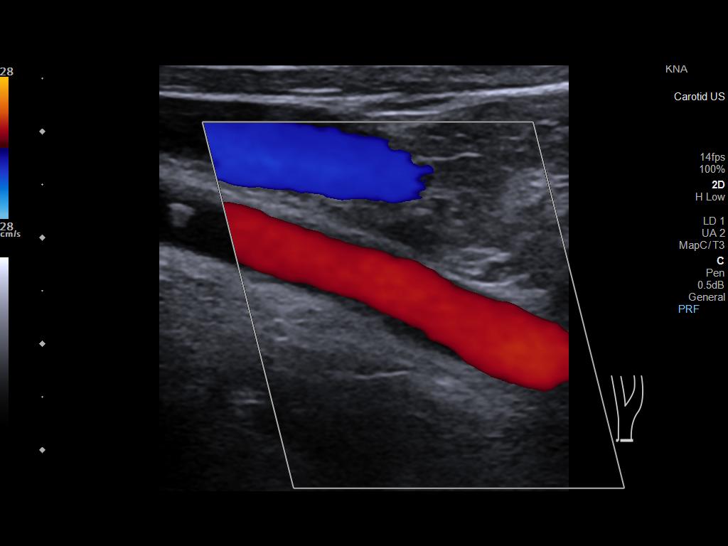
[im 47/73]
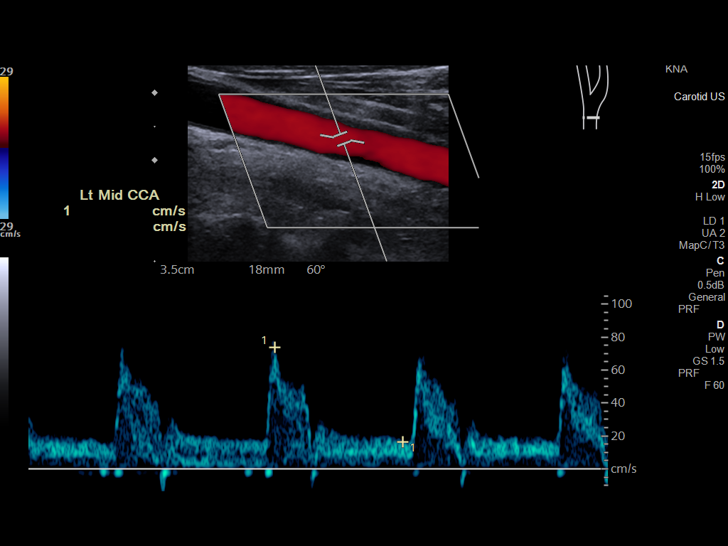
[im 54/73]
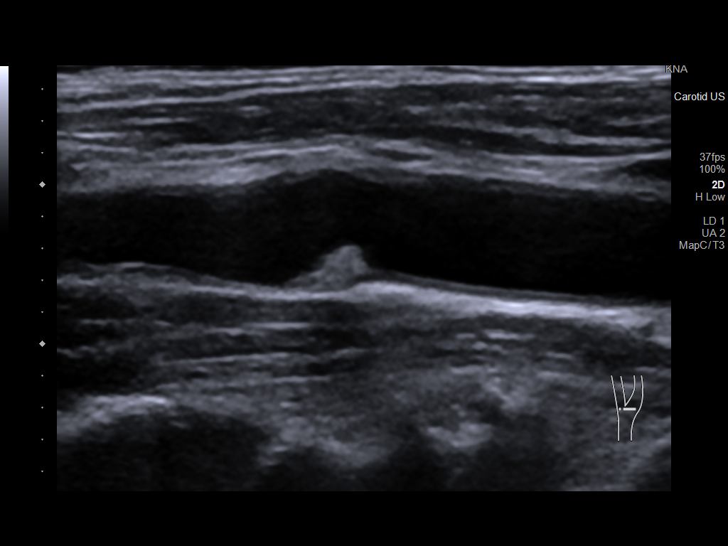
[im 60/73]
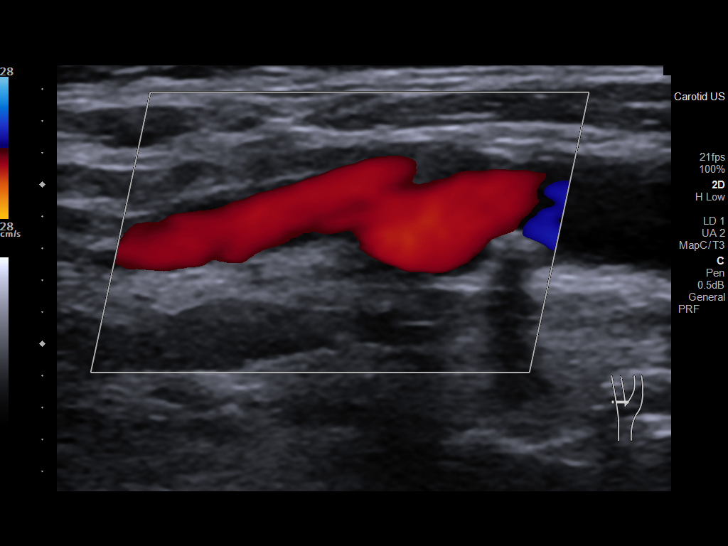
[im 66/73]
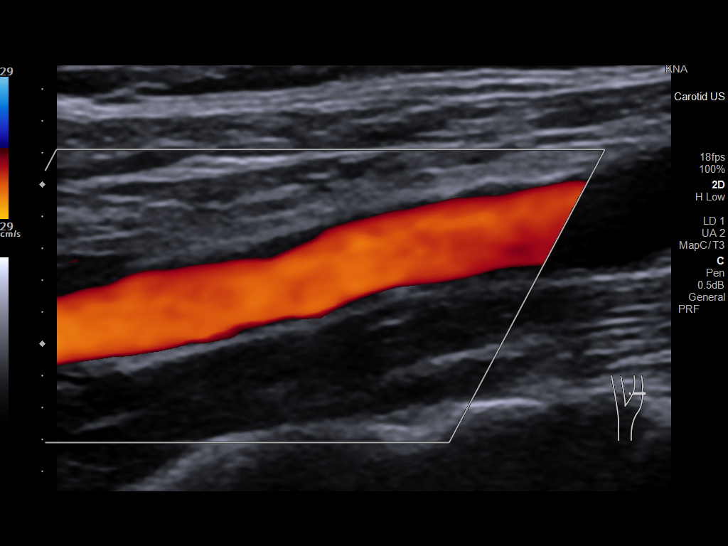
[im 73/73]
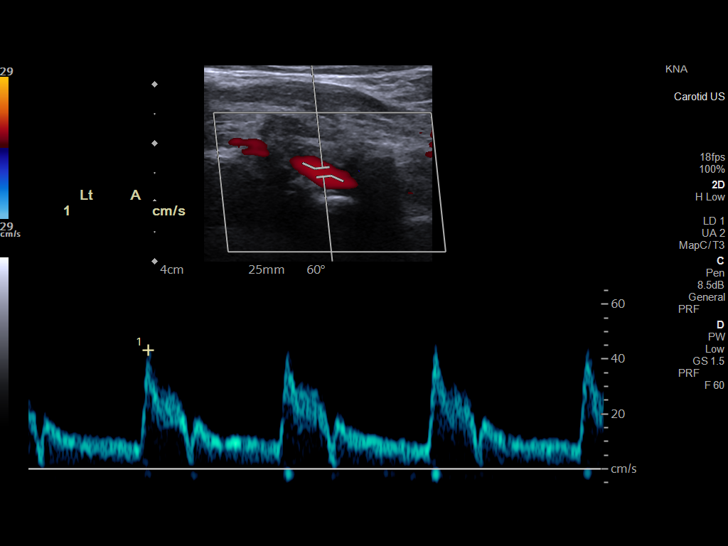

[13 of 24 positions shown; findings below may reference images not displayed]

FINDINGS: Criteria: Quantification of carotid stenosis is based on velocity
parameters that correlate the residual internal carotid diameter
with NASCET-based stenosis levels, using the diameter of the distal
internal carotid lumen as the denominator for stenosis measurement.

The following velocity measurements were obtained:

RIGHT

ICA:  Systolic 39 cm/sec, Diastolic 5 cm/sec

CCA:  73 cm/sec

SYSTOLIC ICA/CCA RATIO:

ECA:  152 cm/sec

LEFT

ICA:  Systolic 96 cm/sec, Diastolic 21 cm/sec

CCA:  74 cm/sec

SYSTOLIC ICA/CCA RATIO:

ECA:  95 cm/sec

Right Brachial SBP: Not acquired

Left Brachial SBP: Not acquired

RIGHT CAROTID ARTERY: No significant calcifications of the right
common carotid artery. Intermediate waveform maintained.
Heterogeneous and partially calcified plaque at the right carotid
bifurcation. No significant lumen shadowing. Low resistance waveform
of the right ICA. No significant tortuosity.

RIGHT VERTEBRAL ARTERY: Antegrade flow with low resistance waveform.

LEFT CAROTID ARTERY: No significant calcifications of the left
common carotid artery. Intermediate waveform maintained.
Heterogeneous and partially calcified plaque at the left carotid
bifurcation without significant lumen shadowing. Low resistance
waveform of the left ICA. No significant tortuosity.

LEFT VERTEBRAL ARTERY:  Antegrade flow with low resistance waveform.
IMPRESSION: Color duplex indicates minimal heterogeneous and calcified plaque,
with no hemodynamically significant stenosis by duplex criteria in
the extracranial cerebrovascular circulation.

## 2020-10-27 ENCOUNTER — Other Ambulatory Visit: Payer: Self-pay | Admitting: Family

## 2020-10-27 DIAGNOSIS — M25559 Pain in unspecified hip: Secondary | ICD-10-CM

## 2020-10-27 DIAGNOSIS — G5793 Unspecified mononeuropathy of bilateral lower limbs: Secondary | ICD-10-CM

## 2020-10-27 DIAGNOSIS — G8929 Other chronic pain: Secondary | ICD-10-CM

## 2020-10-27 DIAGNOSIS — M8949 Other hypertrophic osteoarthropathy, multiple sites: Secondary | ICD-10-CM

## 2020-10-27 DIAGNOSIS — M159 Polyosteoarthritis, unspecified: Secondary | ICD-10-CM

## 2020-10-27 DIAGNOSIS — M5442 Lumbago with sciatica, left side: Secondary | ICD-10-CM

## 2020-11-08 ENCOUNTER — Other Ambulatory Visit: Payer: Self-pay | Admitting: Family

## 2020-12-04 ENCOUNTER — Ambulatory Visit: Payer: 59 | Admitting: Urology

## 2020-12-04 DIAGNOSIS — N401 Enlarged prostate with lower urinary tract symptoms: Secondary | ICD-10-CM

## 2020-12-11 ENCOUNTER — Other Ambulatory Visit: Payer: Self-pay | Admitting: Family

## 2020-12-12 ENCOUNTER — Telehealth: Payer: Self-pay | Admitting: *Deleted

## 2020-12-12 ENCOUNTER — Encounter: Payer: Self-pay | Admitting: Family Medicine

## 2020-12-12 MED ORDER — EMPAGLIFLOZIN 25 MG PO TABS: 25.0000 mg | ORAL_TABLET | Freq: Every day | ORAL | 1 refills | Status: DC

## 2020-12-12 NOTE — Telephone Encounter (Signed)
Steglatro 15MG  tablets PA came in - DENIED Plan recommends one of the following:  Bydureon BCise Not Required Farxiga  Not Required Glimepiride  Not Required GlipiZIDE  Not Required GlipiZIDE ER  Not Required GlipiZIDE-MetFORMIN HCl Not Required Januvia  Not Required Jardiance  Not Required MetFORMIN HCl Not Required MetFORMIN HCl ER Not Required Ozempic (0.25 or 0.5 MG/DOSE) Not Required Pioglitazone HCl Not Required Rybelsus  Not Required Rybelsus  Not Required Trulicity  Not Required Xigduo XR  Not Required

## 2020-12-12 NOTE — Telephone Encounter (Signed)
Steglatro changed to [employer] per [employer] New rx sent to pharmacy.

## 2020-12-26 ENCOUNTER — Ambulatory Visit (INDEPENDENT_AMBULATORY_CARE_PROVIDER_SITE_OTHER): Payer: 59

## 2020-12-26 ENCOUNTER — Other Ambulatory Visit: Payer: Self-pay

## 2020-12-26 ENCOUNTER — Ambulatory Visit (INDEPENDENT_AMBULATORY_CARE_PROVIDER_SITE_OTHER): Payer: 59 | Admitting: Family

## 2020-12-26 ENCOUNTER — Encounter: Payer: Self-pay | Admitting: Family

## 2020-12-26 VITALS — BP 140/79 | HR 82 | Temp 97.2°F | Ht 65.0 in | Wt 141.0 lb

## 2020-12-26 DIAGNOSIS — K219 Gastro-esophageal reflux disease without esophagitis: Secondary | ICD-10-CM | POA: Diagnosis not present

## 2020-12-26 DIAGNOSIS — E1159 Type 2 diabetes mellitus with other circulatory complications: Secondary | ICD-10-CM | POA: Diagnosis not present

## 2020-12-26 DIAGNOSIS — D509 Iron deficiency anemia, unspecified: Secondary | ICD-10-CM

## 2020-12-26 DIAGNOSIS — E785 Hyperlipidemia, unspecified: Secondary | ICD-10-CM

## 2020-12-26 DIAGNOSIS — K5900 Constipation, unspecified: Secondary | ICD-10-CM

## 2020-12-26 DIAGNOSIS — M8949 Other hypertrophic osteoarthropathy, multiple sites: Secondary | ICD-10-CM

## 2020-12-26 DIAGNOSIS — E1142 Type 2 diabetes mellitus with diabetic polyneuropathy: Secondary | ICD-10-CM | POA: Diagnosis not present

## 2020-12-26 DIAGNOSIS — N401 Enlarged prostate with lower urinary tract symptoms: Secondary | ICD-10-CM

## 2020-12-26 DIAGNOSIS — M159 Polyosteoarthritis, unspecified: Secondary | ICD-10-CM

## 2020-12-26 DIAGNOSIS — Z794 Long term (current) use of insulin: Secondary | ICD-10-CM

## 2020-12-26 DIAGNOSIS — R351 Nocturia: Secondary | ICD-10-CM

## 2020-12-26 DIAGNOSIS — E1169 Type 2 diabetes mellitus with other specified complication: Secondary | ICD-10-CM | POA: Diagnosis not present

## 2020-12-26 DIAGNOSIS — I152 Hypertension secondary to endocrine disorders: Secondary | ICD-10-CM

## 2020-12-26 DIAGNOSIS — R062 Wheezing: Secondary | ICD-10-CM

## 2020-12-26 LAB — BAYER DCA HB A1C WAIVED: HB A1C (BAYER DCA - WAIVED): 8.2 % — ABNORMAL HIGH (ref <7.0)

## 2020-12-26 IMAGING — DX DG CHEST 2V
2 series · 2 of 2 positions shown · non-contrast
Comparison: [DATE]

CLINICAL DATA: Wheezing

EXAM:
CHEST - 2 VIEW

[chest lat]
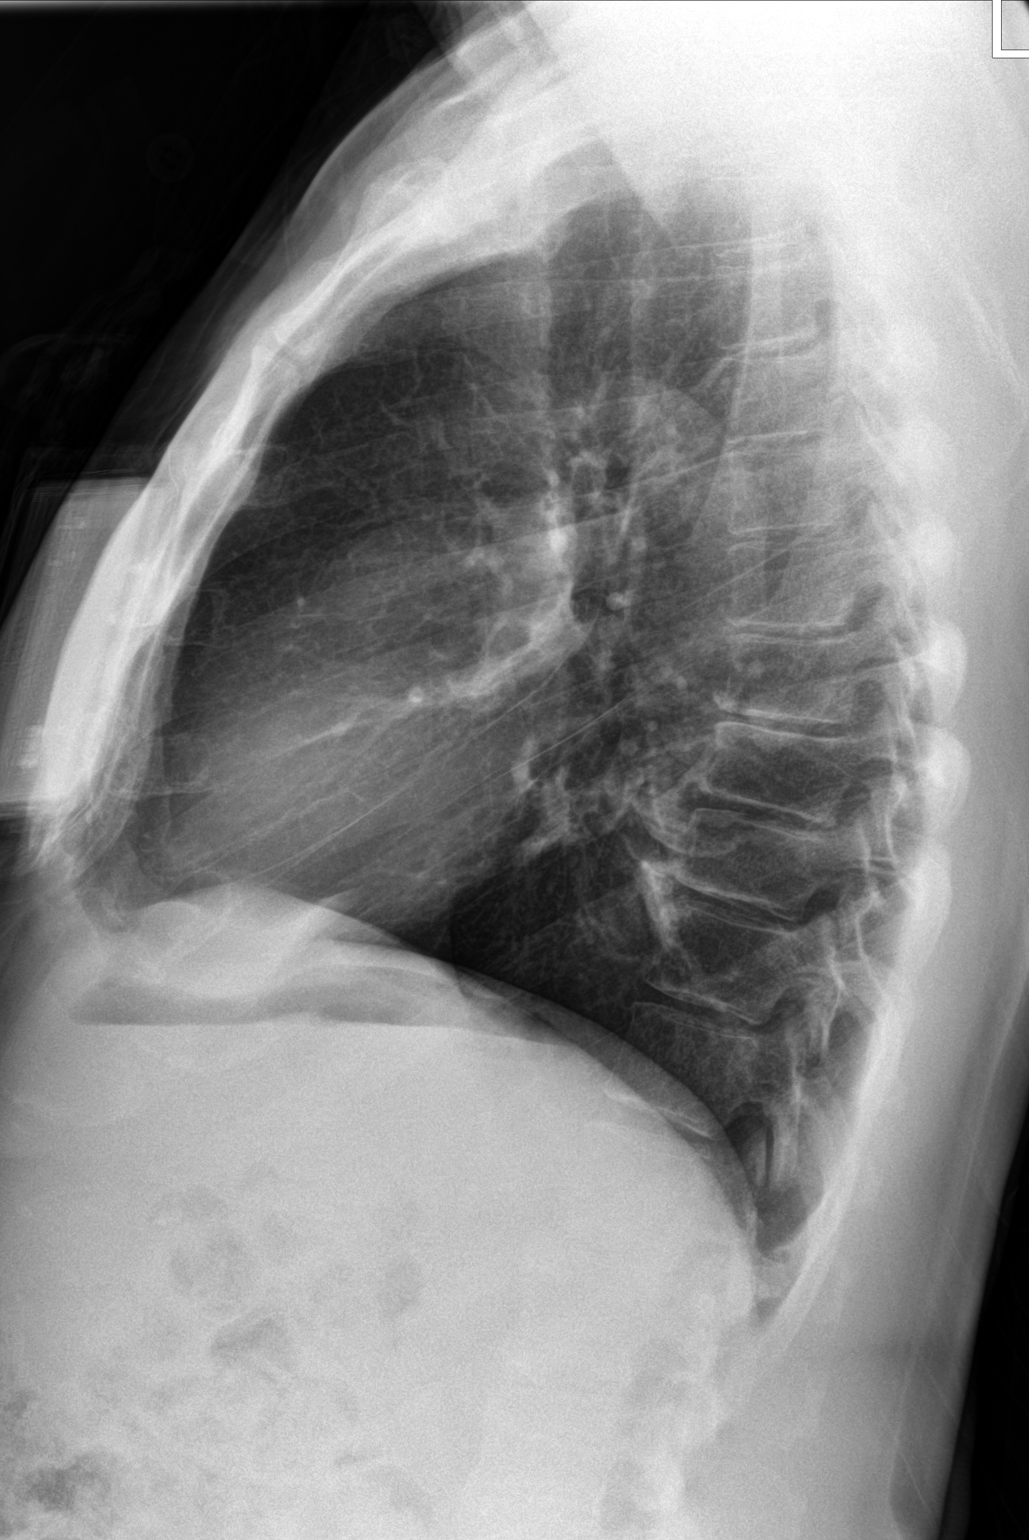

[chest ap]
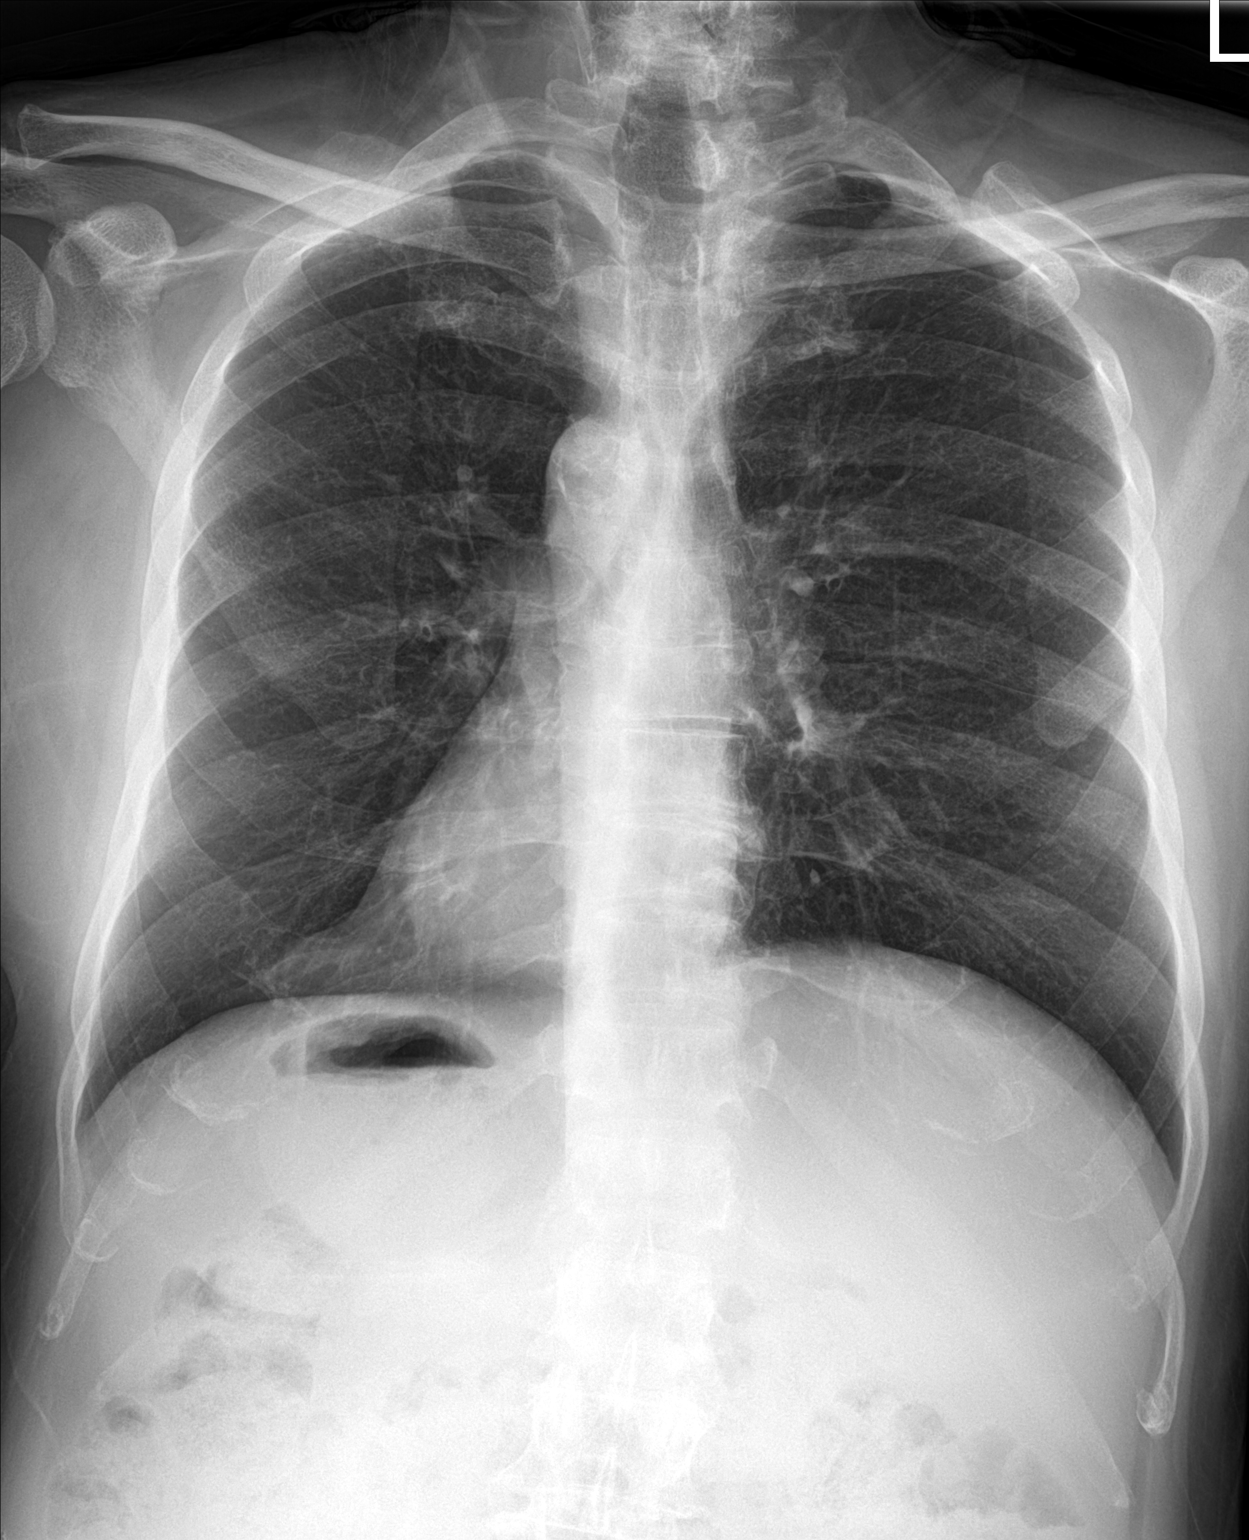

[2 of 2 positions shown; findings below may reference images not displayed]

FINDINGS: The cardiomediastinal silhouette is unchanged in
contour.Atherosclerotic calcifications of the aorta. Mild
peribronchial cuffing. No pleural effusion. No pneumothorax. No
acute pleuroparenchymal abnormality. Visualized abdomen is
unremarkable. Multilevel degenerative changes of the thoracic spine.
IMPRESSION: Mild peribronchial cuffing which can be seen in setting of small
airways disease. No acute cardiopulmonary abnormality.

## 2020-12-26 MED ORDER — ALBUTEROL SULFATE HFA 108 (90 BASE) MCG/ACT IN AERS: 2.0000 | INHALATION_SPRAY | Freq: Four times a day (QID) | RESPIRATORY_TRACT | 0 refills | Status: DC | PRN

## 2020-12-26 NOTE — Patient Instructions (Signed)
Bronchospasm, Adult  Bronchospasm is when the small airways in the lungs narrow. This can make it very hard to breathe. Swelling and more mucus than normal can add to this problem. What are the causes? Common causes of this condition include:  Having a cold.  Exercise.  The smell from sprays, perfumes, candles, and cleaners.  Cold air.  Stress, laughing, or crying. What increases the risk?  Having asthma.  Smoking.  Having allergies.  Being allergic to certain foods, medicine, or bug bites or stings. What are the signs or symptoms? Symptoms of this condition include:  Making whistling sounds when you breathe (wheezing).  Coughing.  A tight feeling in your chest.  Feeling like you cannot catch your breath.  Feeling like you have no energy to exercise.  Breathing that is noisy.  A cough that has a high pitch. How is this treated? This condition may be treated by:  Using medicines that you breathe in. These open up the airways and help you breathe. Medicines can be taken with a metered dose inhaler or a nebulizer device.  Taking medicines to reduce swelling.  Getting rid of what started the bronchospasm. Follow these instructions at home: Medicines  Take over-the-counter and prescription medicines only as told by your doctor.  If you need to use an inhaler or nebulizer to take your medicine, ask your doctor how to use it.  You may be given a spacer to use with your inhaler. This makes it easier to get the medicine from the inhaler into your lungs. Lifestyle  Do not use any products that have nicotine or tobacco. This includes cigarettes, e-cigarettes, and chewing tobacco. If you need help quitting, ask your doctor.  Keep track of things that start your bronchospasm. Avoid these if you are able to.  When pollen, air pollution, or humidity is bad, keep windows closed. Use an air conditioner if you have one.  Find ways to cope with stress and your  feelings. Activity Some people have bronchospasm when they exercise. This is called exercise-induced bronchoconstriction (EIB). If you have this problem, talk with your doctor about how to deal with EIB. Some tips include:  Use your inhaler before exercise.  Exercise indoors if it is very cold, humid, or the pollen and mold count is high.  Warm up and cool down before and after exercise.  Stop your exercise right away if your symptoms start or get worse. General instructions  If you have asthma, make sure you have an asthma action plan.  Stay up to date on your shots (immunizations).  Keep all follow-up visits as told by your doctor. This is important. Get help right away if:  You have trouble breathing.  You wheeze and cough and this does not get better after you take medicine.  You have chest pain.  You have trouble speaking more than one word in a sentence. These symptoms may be an emergency. Do not wait to see if the symptoms will go away. Get medical help right away. Call your local emergency services (911 in the [place] Do not drive yourself to the hospital. Summary  Bronchospasm is when the small airways in the lungs narrow. Swelling and more mucus than normal can add to this problem. This can make it very hard to breathe.  Get help right away if you wheeze and cough and this does not get better after you take medicine.  Do not use any products that have nicotine or tobacco. This includes cigarettes, e-cigarettes,   and chewing tobacco. This information is not intended to replace advice given to you by your health care provider. Make sure you discuss any questions you have with your health care provider. Document Revised: 08/31/2019 Document Reviewed: 08/31/2019 Elsevier Patient Education  2021 [employer]  

## 2020-12-26 NOTE — Progress Notes (Signed)
Subjective:    Patient ID: Jimmy Craig, male    DOB: 1951/06/27, 70 y.o.   MRN: [id number]  Chief Complaint  Patient presents with  . Diabetes  . Medical Management of Chronic Issues    Back surgery last month, pain in left pelvis its not as bad as it use to be ask if ortho can do injection in it   . Wheezing   Pt presents to the office today forchronic follow up. He has chronic back pain and is followed by Neurosurgeon.He had lumbar surgeon 11/22/20.  He had a MRI in 01/2020. He reports his leg pain is improved, but having left hip pain.  Diabetes He presents for his follow-up diabetic visit. He has type 2 diabetes mellitus. His disease course has been stable. There are no hypoglycemic associated symptoms. Associated symptoms include foot paresthesias. Pertinent negatives for diabetes include no blurred vision. Symptoms are stable. Diabetic complications include heart disease. Risk factors for coronary artery disease include dyslipidemia, diabetes mellitus, male sex, hypertension and sedentary lifestyle. He is following a generally healthy diet. His overall blood glucose range is 130-140 mg/dl.  Wheezing  Associated symptoms include coughing.  Hyperlipidemia This is a chronic problem. The current episode started more than 1 year ago. The problem is controlled. Current antihyperlipidemic treatment includes statins. The current treatment provides moderate improvement of lipids. Risk factors for coronary artery disease include dyslipidemia, diabetes mellitus, hypertension, male sex and a sedentary lifestyle.  Gastroesophageal Reflux He complains of belching, coughing, heartburn and wheezing. This is a chronic problem. The current episode started more than 1 year ago. The problem occurs frequently. He has tried a PPI for the symptoms. The treatment provided moderate relief.  Benign Prostatic Hypertrophy The current episode started more than 1 year ago. The problem has been waxing and waning  since onset. Irritative symptoms include nocturia.  Constipation This is a chronic problem. The current episode started more than 1 year ago. He has tried laxatives for the symptoms. The treatment provided moderate relief.      Review of Systems  Eyes: Negative for blurred vision.  Respiratory: Positive for cough and wheezing.   Gastrointestinal: Positive for constipation and heartburn.  Genitourinary: Positive for nocturia.  All other systems reviewed and are negative.      Objective:   Physical Exam Vitals reviewed.  Constitutional:      General: He is not in acute distress.    Appearance: He is well-developed.  HENT:     Head: Normocephalic.     Right Ear: Tympanic membrane normal.     Left Ear: Tympanic membrane normal.  Eyes:     General:        Right eye: No discharge.        Left eye: No discharge.     Pupils: Pupils are equal, round, and reactive to light.  Neck:     Thyroid: No thyromegaly.  Cardiovascular:     Rate and Rhythm: Normal rate and regular rhythm.     Heart sounds: Normal heart sounds. No murmur heard.   Pulmonary:     Effort: Pulmonary effort is normal. No respiratory distress.     Breath sounds: Wheezing present.  Abdominal:     General: Bowel sounds are normal. There is no distension.     Palpations: Abdomen is soft.     Tenderness: There is no abdominal tenderness.  Musculoskeletal:        General: No tenderness. Normal range of motion.  Cervical back: Normal range of motion and neck supple.  Skin:    General: Skin is warm and dry.     Findings: No erythema or rash.  Neurological:     Mental Status: He is alert and oriented to person, place, and time.     Cranial Nerves: No cranial nerve deficit.     Deep Tendon Reflexes: Reflexes are normal and symmetric.  Psychiatric:        Behavior: Behavior normal.        Thought Content: Thought content normal.        Judgment: Judgment normal.      BP 140/79   Pulse 82   Temp (!) 97.2  F (36.2 C) (Temporal)   Ht 5' 5"  (1.651 m)   Wt 141 lb (64 kg)   SpO2 97%   BMI 23.46 kg/m       Assessment & Plan:  Jimmy Craig comes in today with chief complaint of Diabetes, Medical Management of Chronic Issues (Back surgery last month, pain in left pelvis its not as bad as it use to be ask if ortho can do injection in it ), and Wheezing   Diagnosis and orders addressed:  1. Hypertension associated with diabetes ([place]) - CMP14+EGFR - CBC with Differential/Platelet  2. GERD without esophagitis - CMP14+EGFR - CBC with Differential/Platelet  3. Hyperlipidemia associated with type 2 diabetes mellitus ([facility]) - CMP14+EGFR - CBC with Differential/Platelet  4. Type 2 diabetes mellitus with diabetic polyneuropathy, with long-term current use of insulin ([facility]) - CMP14+EGFR - CBC with Differential/Platelet - Bayer DCA Hb A1c Waived  5. Primary osteoarthritis involving multiple joints - CMP14+EGFR - CBC with Differential/Platelet  6. Benign prostatic hyperplasia with nocturia - CMP14+EGFR - CBC with Differential/Platelet  7. Iron deficiency anemia, unspecified iron deficiency anemia type - CMP14+EGFR - CBC with Differential/Platelet  8. Constipation, unspecified constipation type - CMP14+EGFR - CBC with Differential/Platelet  9. Wheezing Will add albuterol  X-ray pending, could be GERD - DG Chest 2 View; Future - albuterol (VENTOLIN HFA) 108 (90 Base) MCG/ACT inhaler; Inhale 2 puffs into the lungs every 6 (six) hours as needed for wheezing or shortness of breath.  Dispense: 8 g; Refill: 0   Labs pending Health Maintenance reviewed Diet and exercise encouraged  Follow up plan: 3 months    Evelina Dun, FNP

## 2020-12-27 LAB — CBC WITH DIFFERENTIAL/PLATELET
Basophils Absolute: 0 10*3/uL (ref 0.0–0.2)
Basos: 1 %
EOS (ABSOLUTE): 0.2 10*3/uL (ref 0.0–0.4)
Eos: 3 %
Hematocrit: 35.6 % — ABNORMAL LOW (ref 37.5–51.0)
Hemoglobin: 10.7 g/dL — ABNORMAL LOW (ref 13.0–17.7)
Immature Grans (Abs): 0 10*3/uL (ref 0.0–0.1)
Immature Granulocytes: 0 %
Lymphocytes Absolute: 1.7 10*3/uL (ref 0.7–3.1)
Lymphs: 35 %
MCH: 24 pg — ABNORMAL LOW (ref 26.6–33.0)
MCHC: 30.1 g/dL — ABNORMAL LOW (ref 31.5–35.7)
MCV: 80 fL (ref 79–97)
Monocytes Absolute: 0.4 10*3/uL (ref 0.1–0.9)
Monocytes: 8 %
Neutrophils Absolute: 2.6 10*3/uL (ref 1.4–7.0)
Neutrophils: 53 %
Platelets: 289 10*3/uL (ref 150–450)
RBC: 4.46 x10E6/uL (ref 4.14–5.80)
RDW: 15.1 % (ref 11.6–15.4)
WBC: 4.9 10*3/uL (ref 3.4–10.8)

## 2020-12-27 LAB — CMP14+EGFR
ALT: 16 IU/L (ref 0–44)
AST: 11 IU/L (ref 0–40)
Albumin/Globulin Ratio: 1.6 (ref 1.2–2.2)
Albumin: 4.2 g/dL (ref 3.8–4.8)
Alkaline Phosphatase: 130 IU/L — ABNORMAL HIGH (ref 44–121)
BUN/Creatinine Ratio: 22 (ref 10–24)
BUN: 20 mg/dL (ref 8–27)
Bilirubin Total: <0.2 mg/dL (ref 0.0–1.2)
CO2: 25 mmol/L (ref 20–29)
Calcium: 9.1 mg/dL (ref 8.6–10.2)
Chloride: 100 mmol/L (ref 96–106)
Creatinine, Ser: 0.92 mg/dL (ref 0.76–1.27)
Globulin, Total: 2.7 g/dL (ref 1.5–4.5)
Glucose: 291 mg/dL — ABNORMAL HIGH (ref 65–99)
Potassium: 4.5 mmol/L (ref 3.5–5.2)
Sodium: 138 mmol/L (ref 134–144)
Total Protein: 6.9 g/dL (ref 6.0–8.5)
eGFR: 91 mL/min/{1.73_m2} (ref >59)

## 2021-01-05 ENCOUNTER — Telehealth: Payer: Self-pay | Admitting: Family

## 2021-01-05 NOTE — Telephone Encounter (Signed)
Appt made

## 2021-01-08 ENCOUNTER — Ambulatory Visit (INDEPENDENT_AMBULATORY_CARE_PROVIDER_SITE_OTHER): Payer: 59 | Admitting: Family Medicine

## 2021-01-08 ENCOUNTER — Other Ambulatory Visit: Payer: Self-pay

## 2021-01-08 ENCOUNTER — Encounter: Payer: Self-pay | Admitting: Family Medicine

## 2021-01-08 ENCOUNTER — Other Ambulatory Visit: Payer: Self-pay | Admitting: Family

## 2021-01-08 VITALS — BP 121/72 | HR 84 | Temp 97.2°F | Resp 20 | Ht 65.0 in | Wt 138.0 lb

## 2021-01-08 DIAGNOSIS — M25552 Pain in left hip: Secondary | ICD-10-CM | POA: Diagnosis not present

## 2021-01-08 DIAGNOSIS — L03116 Cellulitis of left lower limb: Secondary | ICD-10-CM

## 2021-01-08 MED ORDER — METHYLPREDNISOLONE ACETATE 40 MG/ML IJ SUSP
40.0000 mg | Freq: Once | INTRAMUSCULAR | Status: AC
Start: 2021-01-08 — End: 2021-01-08
  Administered 2021-01-08: 40 mg via INTRAMUSCULAR

## 2021-01-08 MED ORDER — MUPIROCIN CALCIUM 2 % EX CREA
1.0000 | TOPICAL_CREAM | Freq: Two times a day (BID) | CUTANEOUS | 1 refills | Status: DC
Start: 2021-01-08 — End: 2021-07-24

## 2021-01-08 NOTE — Progress Notes (Signed)
BP 121/72   Pulse 84   Temp (!) 97.2 F (36.2 C) (Temporal)   Resp 20   Ht 5' 5"  (1.651 m)   Wt 138 lb (62.6 kg)   SpO2 98%   BMI 22.96 kg/m    Subjective:   Patient ID: Jimmy Craig, male    DOB: 1950-08-13, 70 y.o.   MRN: [id number]  HPI: Jimmy Craig is a 70 y.o. male presenting on 01/08/2021 for Abrasions on left leg and Weakness/pain in left leg and hip   HPI Patient is coming in because he had a fall because he has weakness in his legs and his hips on both sides especially some pain in his left hip and leg but he fell and he scraped the front of his left lower leg and is getting some redness and warmth and a lot of swelling there, he says it has improved since has been using Neosporin but is still red and warm about the lesions on that leg and he is coming in to see if he can have something to help with his hip and help with his leg.  He did not have any fevers or chills.  His left hip pain has been chronic and he has had a surgery on his back and they are working with spinal doctor for that.  Relevant past medical, surgical, family and social history reviewed and updated as indicated. Interim medical history since our last visit reviewed. Allergies and medications reviewed and updated.  Review of Systems  Constitutional: Negative for chills and fever.  Eyes: Negative for visual disturbance.  Respiratory: Negative for shortness of breath and wheezing.   Cardiovascular: Negative for chest pain and leg swelling.  Musculoskeletal: Positive for arthralgias and back pain. Negative for gait problem.  Skin: Positive for color change and wound. Negative for rash.  All other systems reviewed and are negative.   Per HPI unless specifically indicated above   Allergies as of 01/08/2021   No Known Allergies     Medication List       Accurate as of January 08, 2021  3:38 PM. If you have any questions, ask your nurse or doctor.        STOP taking these medications    doxycycline 100 MG tablet Commonly known as: VIBRA-TABS Stopped by: Fransisca Kaufmann Shamyia Grandpre, MD     TAKE these medications   Accu-Chek Guide test strip Generic drug: glucose blood Use 1 strip up to 4 times daily.  E10.9, E11.9   albuterol 108 (90 Base) MCG/ACT inhaler Commonly known as: VENTOLIN HFA Inhale 2 puffs into the lungs every 6 (six) hours as needed for wheezing or shortness of breath.   aspirin EC 81 MG tablet Take 1 tablet (81 mg total) by mouth daily. Swallow whole.   atorvastatin 40 MG tablet Commonly known as: LIPITOR Take 1 tablet by mouth once daily   blood glucose meter kit and supplies Dispense based on patient and insurance preference. Use up to four times daily as directed. (FOR ICD-10 E10.9, E11.9).   celecoxib 200 MG capsule Commonly known as: CELEBREX Take 1 capsule by mouth once daily   clopidogrel 75 MG tablet Commonly known as: Plavix Take 1 tablet (75 mg total) by mouth daily.   empagliflozin 25 MG Tabs tablet Commonly known as: Jardiance Take 1 tablet (25 mg total) by mouth daily before breakfast.   gabapentin 300 MG capsule Commonly known as: NEURONTIN Take 2 capsules (600 mg total) by mouth 2 (two)  times daily.   lisinopril 10 MG tablet Commonly known as: ZESTRIL Take 1 tablet (10 mg total) by mouth daily.   metFORMIN 1000 MG tablet Commonly known as: GLUCOPHAGE TAKE 1 TABLET BY MOUTH TWICE DAILY WITH A MEAL  **NEEDS  APPOINTMENT  FOR  MORE  REFILLS   multivitamin with minerals Tabs tablet Take 1 tablet by mouth daily.   mupirocin cream 2 % Commonly known as: Bactroban Apply 1 application topically 2 (two) times daily. Started by: Worthy Rancher, MD   OneTouch Delica Plus [id number] Misc Apply 1 each topically 4 (four) times daily.   pantoprazole 40 MG tablet Commonly known as: PROTONIX Take 1 tablet (40 mg total) by mouth daily.   Pen Needles 32G X 4 MM Misc 1 application by Does not apply route daily.   polyethylene  glycol powder 17 GM/SCOOP powder Commonly known as: GLYCOLAX/MIRALAX Take 17 g by mouth 2 (two) times daily as needed.   tamsulosin 0.4 MG Caps capsule Commonly known as: FLOMAX Take 1 capsule (0.4 mg total) by mouth in the morning and at bedtime.   Tyler Aas FlexTouch 100 UNIT/ML FlexTouch Pen Generic drug: insulin degludec Inject 42 Units into the skin daily. Discontinue levemir   Trulicity 3 BB/0.4UG Sopn Generic drug: Dulaglutide Inject 3 mg as directed once a week.        Objective:   BP 121/72   Pulse 84   Temp (!) 97.2 F (36.2 C) (Temporal)   Resp 20   Ht 5' 5"  (1.651 m)   Wt 138 lb (62.6 kg)   SpO2 98%   BMI 22.96 kg/m   Wt Readings from Last 3 Encounters:  01/08/21 138 lb (62.6 kg)  12/26/20 141 lb (64 kg)  10/09/20 136 lb (61.7 kg)    Physical Exam Vitals and nursing note reviewed.  Constitutional:      Appearance: Normal appearance.  Musculoskeletal:        General: Tenderness (Left hip pain with range of motion) present.  Skin:    Findings: Rash (Multiple abrasions on left anterior lower leg, small amount of erythema surrounding to the abrasions, minimal swelling right now.) present.  Neurological:     Mental Status: He is alert.       Assessment & Plan:   Problem List Items Addressed This Visit   None   Visit Diagnoses    Cellulitis of left lower extremity    -  Primary   Relevant Medications   mupirocin cream (BACTROBAN) 2 %   Left hip pain       Relevant Medications   methylPREDNISolone acetate (DEPO-MEDROL) injection 40 mg (Start on 01/08/2021  3:45 PM)       Follow up plan: Return if symptoms worsen or fail to improve.  Counseling provided for all of the vaccine components No orders of the defined types were placed in this encounter.   Caryl Pina, MD [facility] 01/08/2021, 3:38 PM

## 2021-01-09 ENCOUNTER — Telehealth: Payer: Self-pay | Admitting: Family

## 2021-01-09 NOTE — Telephone Encounter (Signed)
Pt had an appt yesterday and said Dettinger called in mupirocin cream to [facility] in [place] but they only have an ointment, not the cream and wants to know if it can be switched

## 2021-01-09 NOTE — Telephone Encounter (Signed)
Ok to change  to whatever they have

## 2021-01-10 ENCOUNTER — Other Ambulatory Visit: Payer: Self-pay | Admitting: Family

## 2021-01-10 DIAGNOSIS — M159 Polyosteoarthritis, unspecified: Secondary | ICD-10-CM

## 2021-01-12 ENCOUNTER — Other Ambulatory Visit: Payer: Self-pay

## 2021-01-12 ENCOUNTER — Ambulatory Visit (INDEPENDENT_AMBULATORY_CARE_PROVIDER_SITE_OTHER): Payer: 59 | Admitting: Pharmacist

## 2021-01-12 DIAGNOSIS — E1142 Type 2 diabetes mellitus with diabetic polyneuropathy: Secondary | ICD-10-CM

## 2021-01-12 DIAGNOSIS — Z794 Long term (current) use of insulin: Secondary | ICD-10-CM

## 2021-01-12 NOTE — Progress Notes (Signed)
    01/12/2021 Name: Jimmy Craig MRN: [id number] DOB: 05-Dec-1950   S:  48 yoM Presents for diabetes evaluation, education, and management. Patient was referred and last seen by Primary Care Provider on 12/26/20.  Patient works 3rd shift which makes eating challenging.  He is also concerned about low blood sugar while working.   Insurance coverage/medication affordability: [employer]   Patient reports adherence with medications. Current diabetes medications include: trulicity, tresiba, jardiance, metformin Current hypertension medications include: lisinopril Goal 130/80 Current hyperlipidemia medications include: atorvastatin   Patient denies hypoglycemic events   Patient reported dietary habits: Eats 2-3 meals/day Discussed meal planning options and Plate method for healthy eating Avoid sugary drinks and desserts Incorporate balanced protein, non starchy veggies, 1 serving of carbohydrate with each meal Increase water intake Increase physical activity as able   Patient-reported exercise habits: n/a, having left leg pain   Patient reports visual changes.   Patient reports self foot exams.      O:  Lab Results  Component Value Date   HGBA1C 8.2 (H) 12/26/2020   Lipid Panel     Component Value Date/Time   CHOL 111 09/01/2020 1517   TRIG 108 09/01/2020 1517   HDL 42 09/01/2020 1517   CHOLHDL 2.6 09/01/2020 1517   LDLCALC 49 09/01/2020 1517   Home fasting blood sugars: <150  2 hour post-meal/random blood sugars: N/A.  Clinical Atherosclerotic Cardiovascular Disease (ASCVD): No   The ASCVD Risk score Denman George DC Jr., et al., 2013) failed to calculate for the following reasons:   The valid total cholesterol range is 130 to 320 mg/dL    A/P:  Diabetes E7OG currently UNCONTROLLED. Patient is able to verbalize appropriate hypoglycemia management plan. Patient is adherent with medication. Control is suboptimal due to DIET.  PATIENT REPORTS TAKING TRESIBA 35-40  UNITS DAILY  ENCOURAGED PATIENT TO INCREASE TO 40-42 UNITS DAILY  -Continue Trulicity 3mg  sq weekly  Consider increasing to 4.5mg  sq weekly if not controlled at f/u  Denies personal and family history of Medullary thyroid cancer (MTC)  -Continue metformin  -Continue Jardiance 25mg  daily (previously on steglatro; changed due to insurance)  Counseled Sick day rules: if sick, vomiting, have diarrhea, or cannot drink enough fluids, you should stop taking SGLT-2 inhibitors until your symptoms go away. In rare cases, these medicines can cause diabetic ketoacidosis (DKA). DKA is acid buildup in the blood.  -Extensively discussed pathophysiology of diabetes, recommended lifestyle interventions, dietary effects on blood sugar control  -Counseled on s/sx of and management of hypoglycemia  Written patient instructions provided.  Total time in face to face counseling 25 minutes.   , PharmD, BCPS Clinical Pharmacist, [facility]  II Phone [phone]

## 2021-01-17 ENCOUNTER — Other Ambulatory Visit: Payer: Self-pay | Admitting: Family

## 2021-01-29 ENCOUNTER — Other Ambulatory Visit: Payer: Self-pay | Admitting: Family

## 2021-01-29 DIAGNOSIS — M159 Polyosteoarthritis, unspecified: Secondary | ICD-10-CM

## 2021-01-29 DIAGNOSIS — M25559 Pain in unspecified hip: Secondary | ICD-10-CM

## 2021-02-02 ENCOUNTER — Other Ambulatory Visit: Payer: Self-pay | Admitting: Family

## 2021-02-24 ENCOUNTER — Other Ambulatory Visit: Payer: Self-pay | Admitting: Family

## 2021-02-24 DIAGNOSIS — R062 Wheezing: Secondary | ICD-10-CM

## 2021-03-03 ENCOUNTER — Other Ambulatory Visit: Payer: Self-pay | Admitting: Family Medicine

## 2021-03-03 DIAGNOSIS — M5442 Lumbago with sciatica, left side: Secondary | ICD-10-CM

## 2021-03-03 DIAGNOSIS — G8929 Other chronic pain: Secondary | ICD-10-CM

## 2021-03-09 ENCOUNTER — Other Ambulatory Visit: Payer: Self-pay | Admitting: Family

## 2021-03-15 ENCOUNTER — Other Ambulatory Visit: Payer: Self-pay | Admitting: Family

## 2021-03-15 DIAGNOSIS — E1169 Type 2 diabetes mellitus with other specified complication: Secondary | ICD-10-CM

## 2021-03-16 DIAGNOSIS — M5442 Lumbago with sciatica, left side: Secondary | ICD-10-CM | POA: Insufficient documentation

## 2021-03-28 ENCOUNTER — Other Ambulatory Visit: Payer: Self-pay | Admitting: Family

## 2021-03-28 DIAGNOSIS — M159 Polyosteoarthritis, unspecified: Secondary | ICD-10-CM

## 2021-03-28 DIAGNOSIS — M8949 Other hypertrophic osteoarthropathy, multiple sites: Secondary | ICD-10-CM

## 2021-03-28 DIAGNOSIS — M25559 Pain in unspecified hip: Secondary | ICD-10-CM

## 2021-04-12 ENCOUNTER — Other Ambulatory Visit: Payer: Self-pay | Admitting: Family

## 2021-04-16 ENCOUNTER — Other Ambulatory Visit: Payer: Self-pay

## 2021-04-16 ENCOUNTER — Ambulatory Visit (HOSPITAL_COMMUNITY): Payer: 59 | Attending: Neurosurgery | Admitting: Physical Therapy

## 2021-04-16 DIAGNOSIS — M6281 Muscle weakness (generalized): Secondary | ICD-10-CM | POA: Diagnosis present

## 2021-04-16 DIAGNOSIS — R29898 Other symptoms and signs involving the musculoskeletal system: Secondary | ICD-10-CM | POA: Insufficient documentation

## 2021-04-16 DIAGNOSIS — R262 Difficulty in walking, not elsewhere classified: Secondary | ICD-10-CM | POA: Insufficient documentation

## 2021-04-16 DIAGNOSIS — M545 Low back pain, unspecified: Secondary | ICD-10-CM | POA: Diagnosis not present

## 2021-04-16 DIAGNOSIS — R2689 Other abnormalities of gait and mobility: Secondary | ICD-10-CM | POA: Diagnosis present

## 2021-04-16 DIAGNOSIS — R278 Other lack of coordination: Secondary | ICD-10-CM | POA: Insufficient documentation

## 2021-04-16 NOTE — Therapy (Signed)
[facility] [address] [place], [place], [address] Phone: [phone]   Fax:  [phone]  Physical Therapy Evaluation  Patient Details  Name: Jimmy Craig MRN: [id number] Date of Birth: Feb 26, 1951 Referring Provider (PT): Lonna CobbJeffry Jenkins, MD   Encounter Date: 04/16/2021   PT End of Session - 04/16/21 1045     Visit Number 1    Number of Visits 12    Date for PT Re-Evaluation 05/28/21    Authorization Type [employer], 30 CL combined PT/OT with 0 used, no auth    Authorization - Visit Number 1    Authorization - Number of Visits 30    PT Start Time 1045    PT Stop Time 1120    PT Time Calculation (min) 35 min    Activity Tolerance Patient tolerated treatment well    Behavior [facility] for tasks assessed/performed             Past Medical History:  Diagnosis Date   Arthritis    BPH (benign prostatic hyperplasia)    Chronic back pain    GERD (gastroesophageal reflux disease)    Hypertension    Mixed hyperlipidemia    Type 2 diabetes mellitus (HCC)     Past Surgical History:  Procedure Laterality Date   CATARACT EXTRACTION Right    CATARACT EXTRACTION W/PHACO Left 12/20/2013   Procedure: CATARACT EXTRACTION PHACO AND INTRAOCULAR LENS PLACEMENT (IOC);  Surgeon: Susa Simmondsarroll F Haines, MD;  Location: [facility];  Service: Ophthalmology;  Laterality: Left;  CDE: 0.97    There were no vitals filed for this visit.    Subjective Assessment - 04/16/21 1052     Subjective States that he has been having back pain that has been going into his left leg. States that he has pain that limits him functionally. Reports no MOI but reports that pain started one day and got worse. Current pain in back is 4/10 and described as tingling but he is on pain medication. States he is taking gabapentin and a light opioid for pain. States he has difficulty walking as he starts walking it gets worse and worse.    Pertinent History DB    How  long can you walk comfortably? not long    Patient Stated Goals to be able to walk with less pain    Currently in Pain? Yes    Pain Score 4     Pain Location Back    Pain Orientation Left    Pain Descriptors / Indicators Aching    Pain Radiating Towards down back into hips    Aggravating Factors  standing, walking    Pain Relieving Factors rest                [facility] PT Assessment - 04/16/21 0001       Assessment   Medical Diagnosis LBP with left sciatica    Referring Provider (PT) Lonna CobbJeffry Jenkins, MD    Prior Therapy yes      Balance Screen   Has the patient fallen in the past 6 months No      Prior Function   Level of Independence Independent    Vocation Full time employment    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      Cognition   Overall Cognitive Status Within Functional Limits for tasks assessed      Observation/Other Assessments   Focus on Therapeutic Outcomes (FOTO)  37% function.      Posture/Postural Control   Posture/Postural Control Postural limitations    Postural Limitations Rounded Shoulders;Forward head;Decreased lumbar lordosis;Posterior pelvic tilt      ROM / Strength   AROM / PROM / Strength AROM;Strength      AROM   AROM Assessment Site Lumbar;Hip    Right/Left Hip Right;Left    Right Hip External Rotation  50    Right Hip Internal Rotation  45    Left Hip External Rotation  30    Left Hip Internal Rotation  50    Lumbar Flexion 75% limited - tension in legs    Lumbar Extension 75% limited   pain reduced   Lumbar - Right Side Bend 25% limited   pressure on right foot   Lumbar - Left Side Bend 75% limited   no change in symptoms     Strength   Strength Assessment Site Hip;Ankle;Knee    Right/Left Hip Right;Left    Right Hip Flexion 5/5    Right Hip Extension 4-/5    Left Hip Flexion 5/5   pain in lef thip   Left Hip Extension 4-/5    Right/Left Knee Right;Left    Right Knee  Flexion 4/5    Right Knee Extension 5/5    Left Knee Flexion 4/5    Left Knee Extension 5/5      Flexibility   Soft Tissue Assessment /Muscle Length yes    Hamstrings R lackign 30 degrees from straight; L lacking 45 degrees from full extension    Quadriceps WNL wiht knee bend test in prone Bilaterally      Palpation   Spinal mobility hypomobility in lumbar spine -PA felt good on L5/S1    Palpation comment tenderness to palpation in bilateral glues      Special Tests    Special Tests Lumbar    Lumbar Tests other;Slump Test;Straight Leg Raise      Slump test   Findings Negative      Straight Leg Raise   Findings Negative      other   Comments repeated prone extensions - reduction in symptoms                        Objective measurements completed on examination: See above findings.       [facility] Adult PT Treatment/Exercise - 04/16/21 0001       Exercises   Exercises Lumbar      Lumbar Exercises: Stretches   Prone on Elbows Stretch 5 reps;10 seconds   2 sets   Piriformis Stretch Left;Right;2 reps;30 seconds   seated                    PT Education - 04/16/21 1117     Education Details on current posture, use of towel roll for lumbar support, HEP and current findings,    Person(s) Educated Patient    Methods Explanation    Comprehension Verbalized understanding              PT Short Term Goals - 04/16/21 1123       PT SHORT TERM GOAL #1   Title Patient will be able to demonstrate pain free lumbar motion    Time 3    Period Weeks    Status New    Target Date 05/07/21      PT SHORT TERM GOAL #2   Title Patient will report at least 50% improvement  in overall symptoms and/or function to demonstrate improved functional mobility    Time 3    Period Weeks    Status New    Target Date 05/07/21      PT SHORT TERM GOAL #3   Title Patient will be independent in self management strategies to improve quality of life and functional  outcomes.    Time 3    Period Weeks    Status New    Target Date 05/07/21               PT Long Term Goals - 04/16/21 1124       PT LONG TERM GOAL #1   Title Patient will report at least 75% improvement in symptoms for improved quality of life.    Time 6    Period Weeks    Status New    Target Date 05/28/21      PT LONG TERM GOAL #2   Title Patient will meet predicted FOTO score to demonstrate improved overall function.    Time 6    Period Weeks    Status New    Target Date 05/28/21      PT LONG TERM GOAL #3   Title Patient will be able to ambulate for at least 30 minutes with pain no greater than 1/10 in order to demonstrate improved ability to ambulate in the community.    Time 6    Period Weeks    Status New    Target Date 05/28/21                    Plan - 04/16/21 1122     Clinical Impression Statement Patient is a 70 y.o. male who presents to physical therapy with complaint of low back pain radiating into his legs which is most painful with walking. Patient with history of lumbar surgery earlier this year which helped with his pain but he is still having difficulties walking. Patient demonstrates decreased strength, ROM restriction, balance deficits and gait abnormalities which are likely contributing to symptoms of pain and are negatively impacting patient ability to perform ADLs and functional mobility tasks. Patient will benefit from skilled physical therapy services to address these deficits to reduce pain, improve level of function with ADLs, functional mobility tasks, and reduce risk for falls.    Personal Factors and Comorbidities Comorbidity 1;Comorbidity 2    Comorbidities hx lumbar surgery april 2022, DB    Examination-Activity Limitations Bed Mobility;Stairs;Stand;Transfers;Locomotion Level    Examination-Participation Restrictions Driving;Community Activity;Occupation;Cleaning    Stability/Clinical Decision Making Stable/Uncomplicated     Clinical Decision Making Low    Rehab Potential Good    PT Frequency 2x / week    PT Duration 6 weeks    PT Treatment/Interventions ADLs/Self Care Home Management;Aquatic Therapy;Electrical Stimulation;Cryotherapy;Moist Heat;Traction;Therapeutic exercise;Therapeutic activities;Stair training;Gait training;Neuromuscular re-education;Patient/family education;Manual techniques;Dry needling;Passive range of motion;Joint Manipulations    PT Next Visit Plan f/u with lumbar extension, glut strengthening, posture, stretches/ROM, trial traction, core strength    PT Home Exercise Plan POE, piriformis stretch, lumbar support in sitting    Recommended Other Services occupational therapy for left hand weakness - check for order which was sent on eval date    Consulted and Agree with Plan of Care Patient             Patient will benefit from skilled therapeutic intervention in order to improve the following deficits and impairments:  Pain, Decreased strength, Difficulty walking, Decreased range of motion, Decreased mobility, Decreased  activity tolerance, Postural dysfunction, Improper body mechanics  Visit Diagnosis: Low back pain, unspecified back pain laterality, unspecified chronicity, unspecified whether sciatica present - Plan: PT plan of care cert/re-cert  Muscle weakness (generalized) - Plan: PT plan of care cert/re-cert  Difficulty in walking, not elsewhere classified - Plan: PT plan of care cert/re-cert     Problem List Patient Active Problem List   Diagnosis Date Noted   Constipation 06/01/2020   Osteoarthritis 04/26/2019   Hypertension associated with diabetes (HCC) 09/29/2018   Diabetes (HCC) 08/14/2018   Hyperlipidemia associated with type 2 diabetes mellitus (HCC) 08/14/2018   GERD without esophagitis 08/14/2018   BPH (benign prostatic hyperplasia) 08/14/2018   Iron deficiency anemia 08/14/2018   Dizzy spells 01/20/2015    11:30 AM, 04/16/21 Tereasa Coop, DPT Physical  Therapy with [facility]  [phone] office   [facility] [address] [place], [place], [address] Phone: [phone]   Fax:  [phone]  Name: Jimmy Craig MRN: [id number] Date of Birth: 1951-05-09

## 2021-04-19 ENCOUNTER — Ambulatory Visit (HOSPITAL_COMMUNITY): Payer: 59

## 2021-04-19 ENCOUNTER — Other Ambulatory Visit: Payer: Self-pay

## 2021-04-19 ENCOUNTER — Encounter (HOSPITAL_COMMUNITY): Payer: Self-pay

## 2021-04-19 DIAGNOSIS — R262 Difficulty in walking, not elsewhere classified: Secondary | ICD-10-CM

## 2021-04-19 DIAGNOSIS — M545 Low back pain, unspecified: Secondary | ICD-10-CM

## 2021-04-19 DIAGNOSIS — M6281 Muscle weakness (generalized): Secondary | ICD-10-CM

## 2021-04-19 NOTE — Therapy (Addendum)
[facility] [address] [place], [place], [address] Phone: [phone]   Fax:  [phone]  Physical Therapy Treatment  Patient Details  Name: Jimmy Craig MRN: [id number] Date of Birth: [birth year] Referring Provider (PT): Lonna Cobb, MD   Encounter Date: 04/19/2021   PT End of Session - 04/19/21 1424     Visit Number 2    Number of Visits 12    Date for PT Re-Evaluation 05/28/21    Authorization Type [employer], 30 CL combined PT/OT with 0 used, no auth    Authorization - Visit Number 2    Authorization - Number of Visits 30    PT Start Time 1405    PT Stop Time 1443    PT Time Calculation (min) 38 min    Activity Tolerance Patient tolerated treatment well    Behavior During Therapy [facility] for tasks assessed/performed             Past Medical History:  Diagnosis Date   Arthritis    BPH (benign prostatic hyperplasia)    Chronic back pain    GERD (gastroesophageal reflux disease)    Hypertension    Mixed hyperlipidemia    Type 2 diabetes mellitus (HCC)     Past Surgical History:  Procedure Laterality Date   CATARACT EXTRACTION Right    CATARACT EXTRACTION W/PHACO Left 12/20/2013   Procedure: CATARACT EXTRACTION PHACO AND INTRAOCULAR LENS PLACEMENT (IOC);  Surgeon: Susa Simmonds, MD;  Location: [facility];  Service: Ophthalmology;  Laterality: Left;  CDE: 0.97    There were no vitals filed for this visit.   Subjective Assessment - 04/19/21 1407     Subjective Pt stated he feels the same as last time, LBP and anterior radicular symptoms to Lt knee.  Reports he has began the HEP daily.  Reports no real change with extension based exercises, no increase or decrease.    Patient Stated Goals to be able to walk with less pain    Currently in Pain? Yes    Pain Score 4     Pain Location Back    Pain Orientation Lower;Left    Pain Descriptors / Indicators Aching;Sharp    Pain Radiating Towards Lt LE anterior to  knees, occasional anterior pain to toes on Rt    Pain Onset More than a month ago   >6 months   Pain Frequency Intermittent    Aggravating Factors  walking, standing    Pain Relieving Factors resting                               [facility] Adult PT Treatment/Exercise - 04/19/21 0001       Posture/Postural Control   Posture/Postural Control Postural limitations    Postural Limitations Rounded Shoulders;Forward head;Decreased lumbar lordosis;Posterior pelvic tilt      Exercises   Exercises Lumbar      Lumbar Exercises: Stretches   Lower Trunk Rotation 5 reps;10 seconds    Prone on Elbows Stretch Limitations 2 min    Press Ups 5 reps;10 seconds    Piriformis Stretch Right;Left;2 reps;30 seconds    Piriformis Stretch Limitations supine      Lumbar Exercises: Supine   Bent Knee Raise 10 reps;3 seconds    Bent Knee Raise Limitations with ab set, cueing for stability    Bridge 10 reps;3 seconds    Bridge Limitations 2 sets: WBOS and NBOS  Lumbar Exercises: Prone   Straight Leg Raise 10 reps;3 seconds    Straight Leg Raises Limitations cueing for controlled lowering    Other Prone Lumbar Exercises heel squeeze 10x 5"      Manual Therapy   Manual Therapy Manual Traction    Manual therapy comments Manual complete separate than rest of tx    Manual Traction Manual traction with BLE on theraball 4x                     PT Education - 04/19/21 1422     Education Details Reviewed goals, educated importance of HEP compliance, pt stated he has began exercises daily without questions    Person(s) Educated Patient    Methods Explanation    Comprehension Verbalized understanding;Returned demonstration              PT Short Term Goals - 04/16/21 1123       PT SHORT TERM GOAL #1   Title Patient will be able to demonstrate pain free lumbar motion    Time 3    Period Weeks    Status New    Target Date 05/07/21      PT SHORT TERM GOAL #2    Title Patient will report at least 50% improvement in overall symptoms and/or function to demonstrate improved functional mobility    Time 3    Period Weeks    Status New    Target Date 05/07/21      PT SHORT TERM GOAL #3   Title Patient will be independent in self management strategies to improve quality of life and functional outcomes.    Time 3    Period Weeks    Status New    Target Date 05/07/21               PT Long Term Goals - 04/16/21 1124       PT LONG TERM GOAL #1   Title Patient will report at least 75% improvement in symptoms for improved quality of life.    Time 6    Period Weeks    Status New    Target Date 05/28/21      PT LONG TERM GOAL #2   Title Patient will meet predicted FOTO score to demonstrate improved overall function.    Time 6    Period Weeks    Status New    Target Date 05/28/21      PT LONG TERM GOAL #3   Title Patient will be able to ambulate for at least 30 minutes with pain no greater than 1/10 in order to demonstrate improved ability to ambulate in the community.    Time 6    Period Weeks    Status New    Target Date 05/28/21                   Plan - 04/19/21 1558     Clinical Impression Statement Reviewed goals, educated importance of HEP compliance for maximal benefits, pt reports he has began the exercises without questions.  Session focus on extension based exercises, core and proximal strenghtening.  Pt required verbal and tactile cueing for core stability.  Reports pain resolved at EOS.  Added bridges, LTR and supine piriformis to HEP with printout given and verbalized understanding.    Personal Factors and Comorbidities Comorbidity 1;Comorbidity 2    Comorbidities hx lumbar surgery april 2022, DB    Examination-Activity Limitations Bed Mobility;Stairs;Stand;Transfers;Locomotion Level  Examination-Participation Restrictions Driving;Community Activity;Occupation;Cleaning    Stability/Clinical Decision Making  Stable/Uncomplicated    Clinical Decision Making Low    Rehab Potential Good    PT Frequency 2x / week    PT Duration 6 weeks    PT Treatment/Interventions ADLs/Self Care Home Management;Aquatic Therapy;Electrical Stimulation;Cryotherapy;Moist Heat;Traction;Therapeutic exercise;Therapeutic activities;Stair training;Gait training;Neuromuscular re-education;Patient/family education;Manual techniques;Dry needling;Passive range of motion;Joint Manipulations    PT Next Visit Plan f/u with lumbar extension, glut strengthening, posture, stretches/ROM, trial traction, core strength    PT Home Exercise Plan POE, piriformis stretch, lumbar support in sitting; 9/15: bridge, LTR, piriformis in supine position.    Consulted and Agree with Plan of Care Patient             Patient will benefit from skilled therapeutic intervention in order to improve the following deficits and impairments:  Pain, Decreased strength, Difficulty walking, Decreased range of motion, Decreased mobility, Decreased activity tolerance, Postural dysfunction, Improper body mechanics  Visit Diagnosis: Low back pain, unspecified back pain laterality, unspecified chronicity, unspecified whether sciatica present  Muscle weakness (generalized)  Difficulty in walking, not elsewhere classified     Problem List Patient Active Problem List   Diagnosis Date Noted   Constipation 06/01/2020   Osteoarthritis 04/26/2019   Hypertension associated with diabetes (HCC) 09/29/2018   Diabetes (HCC) 08/14/2018   Hyperlipidemia associated with type 2 diabetes mellitus (HCC) 08/14/2018   GERD without esophagitis 08/14/2018   BPH (benign prostatic hyperplasia) 08/14/2018   Iron deficiency anemia 08/14/2018   Dizzy spells 01/20/2015   Becky Sax, LPTA/CLT; CBIS [phone]  Juel Burrow, PTA 04/19/2021, 4:01 PM  [facility] [address] [place], [place], [address] Phone:  [phone]   Fax:  [phone]  Name: Jimmy Craig MRN: [id number] Date of Birth: Oct 01, 1950

## 2021-04-23 ENCOUNTER — Ambulatory Visit (INDEPENDENT_AMBULATORY_CARE_PROVIDER_SITE_OTHER): Payer: 59 | Admitting: Family

## 2021-04-23 ENCOUNTER — Other Ambulatory Visit: Payer: Self-pay

## 2021-04-23 ENCOUNTER — Encounter: Payer: Self-pay | Admitting: Family

## 2021-04-23 VITALS — BP 118/69 | HR 81 | Temp 97.8°F | Ht 65.0 in | Wt 138.6 lb

## 2021-04-23 DIAGNOSIS — G629 Polyneuropathy, unspecified: Secondary | ICD-10-CM

## 2021-04-23 DIAGNOSIS — E1169 Type 2 diabetes mellitus with other specified complication: Secondary | ICD-10-CM | POA: Diagnosis not present

## 2021-04-23 DIAGNOSIS — E1142 Type 2 diabetes mellitus with diabetic polyneuropathy: Secondary | ICD-10-CM | POA: Diagnosis not present

## 2021-04-23 DIAGNOSIS — Z23 Encounter for immunization: Secondary | ICD-10-CM

## 2021-04-23 DIAGNOSIS — I152 Hypertension secondary to endocrine disorders: Secondary | ICD-10-CM

## 2021-04-23 DIAGNOSIS — M159 Polyosteoarthritis, unspecified: Secondary | ICD-10-CM

## 2021-04-23 DIAGNOSIS — K219 Gastro-esophageal reflux disease without esophagitis: Secondary | ICD-10-CM | POA: Diagnosis not present

## 2021-04-23 DIAGNOSIS — E1159 Type 2 diabetes mellitus with other circulatory complications: Secondary | ICD-10-CM | POA: Diagnosis not present

## 2021-04-23 DIAGNOSIS — D509 Iron deficiency anemia, unspecified: Secondary | ICD-10-CM

## 2021-04-23 DIAGNOSIS — K5900 Constipation, unspecified: Secondary | ICD-10-CM

## 2021-04-23 DIAGNOSIS — E785 Hyperlipidemia, unspecified: Secondary | ICD-10-CM

## 2021-04-23 DIAGNOSIS — R351 Nocturia: Secondary | ICD-10-CM

## 2021-04-23 DIAGNOSIS — N401 Enlarged prostate with lower urinary tract symptoms: Secondary | ICD-10-CM

## 2021-04-23 DIAGNOSIS — Z794 Long term (current) use of insulin: Secondary | ICD-10-CM

## 2021-04-23 DIAGNOSIS — M8949 Other hypertrophic osteoarthropathy, multiple sites: Secondary | ICD-10-CM

## 2021-04-23 DIAGNOSIS — J301 Allergic rhinitis due to pollen: Secondary | ICD-10-CM

## 2021-04-23 LAB — BAYER DCA HB A1C WAIVED: HB A1C (BAYER DCA - WAIVED): 6.8 % — ABNORMAL HIGH (ref 4.8–5.6)

## 2021-04-23 MED ORDER — BENZONATATE 200 MG PO CAPS: 200.0000 mg | ORAL_CAPSULE | Freq: Three times a day (TID) | ORAL | 1 refills | Status: DC | PRN

## 2021-04-23 MED ORDER — FLUTICASONE PROPIONATE 50 MCG/ACT NA SUSP: 2.0000 | Freq: Every day | NASAL | 6 refills | Status: DC

## 2021-04-23 MED ORDER — CETIRIZINE HCL 10 MG PO TABS: 10.0000 mg | ORAL_TABLET | Freq: Every day | ORAL | 11 refills | Status: DC

## 2021-04-23 MED ORDER — TRESIBA FLEXTOUCH 100 UNIT/ML ~~LOC~~ SOPN: PEN_INJECTOR | SUBCUTANEOUS | 1 refills | Status: DC

## 2021-04-23 MED ORDER — GABAPENTIN 600 MG PO TABS: 600.0000 mg | ORAL_TABLET | Freq: Three times a day (TID) | ORAL | 3 refills | Status: DC

## 2021-04-23 MED ORDER — TRULICITY 3 MG/0.5ML ~~LOC~~ SOAJ: 3.0000 mg | SUBCUTANEOUS | 0 refills | Status: DC

## 2021-04-23 NOTE — Patient Instructions (Signed)
Diabetes Mellitus and Nutrition, Adult When you have diabetes, or diabetes mellitus, it is very important to have healthy eating habits because your blood sugar (glucose) levels are greatly affected by what you eat and drink. Eating healthy foods in the right amounts, at about the same times every day, can help you:  Control your blood glucose.  Lower your risk of heart disease.  Improve your blood pressure.  Reach or maintain a healthy weight. What can affect my meal plan? Every person with diabetes is different, and each person has different needs for a meal plan. Your health care provider may recommend that you work with a dietitian to make a meal plan that is best for you. Your meal plan may vary depending on factors such as:  The calories you need.  The medicines you take.  Your weight.  Your blood glucose, blood pressure, and cholesterol levels.  Your activity level.  Other health conditions you have, such as heart or kidney disease. How do carbohydrates affect me? Carbohydrates, also called carbs, affect your blood glucose level more than any other type of food. Eating carbs naturally raises the amount of glucose in your blood. Carb counting is a method for keeping track of how many carbs you eat. Counting carbs is important to keep your blood glucose at a healthy level, especially if you use insulin or take certain oral diabetes medicines. It is important to know how many carbs you can safely have in each meal. This is different for every person. Your dietitian can help you calculate how many carbs you should have at each meal and for each snack. How does alcohol affect me? Alcohol can cause a sudden decrease in blood glucose (hypoglycemia), especially if you use insulin or take certain oral diabetes medicines. Hypoglycemia can be a life-threatening condition. Symptoms of hypoglycemia, such as sleepiness, dizziness, and confusion, are similar to symptoms of having too much  alcohol.  Do not drink alcohol if: ? Your health care provider tells you not to drink. ? You are pregnant, may be pregnant, or are planning to become pregnant.  If you drink alcohol: ? Do not drink on an empty stomach. ? Limit how much you use to:  0-1 drink a day for women.  0-2 drinks a day for men. ? Be aware of how much alcohol is in your drink. In the [place] one drink equals one 12 oz bottle of beer (355 mL), one 5 oz glass of wine (148 mL), or one 1 oz glass of hard liquor (44 mL). ? Keep yourself hydrated with water, diet soda, or unsweetened iced tea.  Keep in mind that regular soda, juice, and other mixers may contain a lot of sugar and must be counted as carbs. What are tips for following this plan? Reading food labels  Start by checking the serving size on the "Nutrition Facts" label of packaged foods and drinks. The amount of calories, carbs, fats, and other nutrients listed on the label is based on one serving of the item. Many items contain more than one serving per package.  Check the total grams (g) of carbs in one serving. You can calculate the number of servings of carbs in one serving by dividing the total carbs by 15. For example, if a food has 30 g of total carbs per serving, it would be equal to 2 servings of carbs.  Check the number of grams (g) of saturated fats and trans fats in one serving. Choose foods that have   a low amount or none of these fats.  Check the number of milligrams (mg) of salt (sodium) in one serving. Most people should limit total sodium intake to less than 2,300 mg per day.  Always check the nutrition information of foods labeled as "low-fat" or "nonfat." These foods may be higher in added sugar or refined carbs and should be avoided.  Talk to your dietitian to identify your daily goals for nutrients listed on the label. Shopping  Avoid buying canned, pre-made, or processed foods. These foods tend to be high in fat, sodium, and added  sugar.  Shop around the outside edge of the grocery store. This is where you will most often find fresh fruits and vegetables, bulk grains, fresh meats, and fresh dairy. Cooking  Use low-heat cooking methods, such as baking, instead of high-heat cooking methods like deep frying.  Cook using healthy oils, such as olive, canola, or sunflower oil.  Avoid cooking with butter, cream, or high-fat meats. Meal planning  Eat meals and snacks regularly, preferably at the same times every day. Avoid going long periods of time without eating.  Eat foods that are high in fiber, such as fresh fruits, vegetables, beans, and whole grains. Talk with your dietitian about how many servings of carbs you can eat at each meal.  Eat 4-6 oz (112-168 g) of lean protein each day, such as lean meat, chicken, fish, eggs, or tofu. One ounce (oz) of lean protein is equal to: ? 1 oz (28 g) of meat, chicken, or fish. ? 1 egg. ?  cup (62 g) of tofu.  Eat some foods each day that contain healthy fats, such as avocado, nuts, seeds, and fish.   What foods should I eat? Fruits Berries. Apples. Oranges. Peaches. Apricots. Plums. Grapes. Mango. Papaya. Pomegranate. Kiwi. Cherries. Vegetables Lettuce. Spinach. Leafy greens, including kale, chard, collard greens, and mustard greens. Beets. Cauliflower. Cabbage. Broccoli. Carrots. Green beans. Tomatoes. Peppers. Onions. Cucumbers. Brussels sprouts. Grains Whole grains, such as whole-wheat or whole-grain bread, crackers, tortillas, cereal, and pasta. Unsweetened oatmeal. Quinoa. Brown or wild rice. Meats and other proteins Seafood. Poultry without skin. Lean cuts of poultry and beef. Tofu. Nuts. Seeds. Dairy Low-fat or fat-free dairy products such as milk, yogurt, and cheese. The items listed above may not be a complete list of foods and beverages you can eat. Contact a dietitian for more information. What foods should I avoid? Fruits Fruits canned with  syrup. Vegetables Canned vegetables. Frozen vegetables with butter or cream sauce. Grains Refined white flour and flour products such as bread, pasta, snack foods, and cereals. Avoid all processed foods. Meats and other proteins Fatty cuts of meat. Poultry with skin. Breaded or fried meats. Processed meat. Avoid saturated fats. Dairy Full-fat yogurt, cheese, or milk. Beverages Sweetened drinks, such as soda or iced tea. The items listed above may not be a complete list of foods and beverages you should avoid. Contact a dietitian for more information. Questions to ask a health care provider  Do I need to meet with a diabetes educator?  Do I need to meet with a dietitian?  What number can I call if I have questions?  When are the best times to check my blood glucose? Where to find more information:  American Diabetes Association: diabetes.org  Academy of Nutrition and Dietetics: www.eatright.org  National Institute of Diabetes and Digestive and Kidney Diseases: www.niddk.nih.gov  Association of Diabetes Care and Education Specialists: www.diabeteseducator.org Summary  It is important to have healthy eating   habits because your blood sugar (glucose) levels are greatly affected by what you eat and drink.  A healthy meal plan will help you control your blood glucose and maintain a healthy lifestyle.  Your health care provider may recommend that you work with a dietitian to make a meal plan that is best for you.  Keep in mind that carbohydrates (carbs) and alcohol have immediate effects on your blood glucose levels. It is important to count carbs and to use alcohol carefully. This information is not intended to replace advice given to you by your health care provider. Make sure you discuss any questions you have with your health care provider. Document Revised: 06/29/2019 Document Reviewed: 06/29/2019 Elsevier Patient Education  2021 Elsevier Inc.  

## 2021-04-23 NOTE — Progress Notes (Signed)
Subjective:    Patient ID: Jimmy Craig, male    DOB: 06/22/1951, 70 y.o.   MRN: [id number]  Chief Complaint  Patient presents with   Medical Management of Chronic Issues    One sitting for long time getting up he feels like he is falling. Been going on for years but happens randomly. Heart doctor says its not the heart. Just blood pressure when it happens but it was not that.   Cough    Has to spit every 5-10 min and nose stopped up x 4weeks    Pt presents to the office today for chronic follow up. He has chronic back pain and is followed by Neurosurgeon. He had lumbar surgery on  11/22/20.  He had a MRI in 01/2020. He reports his leg pain is improved, but having pain of 3 out 10.  Cough This is a new problem. The current episode started 1 to 4 weeks ago. The problem has been gradually worsening. The problem occurs every few minutes. The cough is Productive of sputum. Associated symptoms include headaches (slightly), heartburn, nasal congestion, postnasal drip and wheezing. Pertinent negatives include no ear congestion, ear pain, fever, sore throat or shortness of breath.  Diabetes He presents for his follow-up diabetic visit. He has type 2 diabetes mellitus. Hypoglycemia symptoms include headaches (slightly). Associated symptoms include foot paresthesias. Pertinent negatives for diabetes include no blurred vision. Pertinent negatives for diabetic complications include no heart disease. Risk factors for coronary artery disease include dyslipidemia, diabetes mellitus, male sex, hypertension and sedentary lifestyle. He is following a generally healthy diet. His overall blood glucose range is 130-140 mg/dl. An ACE inhibitor/angiotensin II receptor blocker is being taken. Eye exam is not current.  Hypertension This is a chronic problem. The current episode started more than 1 year ago. The problem has been resolved since onset. The problem is controlled. Associated symptoms include headaches  (slightly) and malaise/fatigue. Pertinent negatives include no blurred vision, peripheral edema or shortness of breath. Risk factors for coronary artery disease include dyslipidemia, diabetes mellitus, obesity and male gender. The current treatment provides moderate improvement.  Gastroesophageal Reflux He complains of belching, coughing, heartburn and wheezing. He reports no sore throat. This is a chronic problem. The current episode started more than 1 year ago. The problem occurs occasionally. He has tried an antacid for the symptoms. The treatment provided moderate relief.  Hyperlipidemia This is a chronic problem. The current episode started more than 1 year ago. Recent lipid tests were reviewed and are normal. Pertinent negatives include no shortness of breath. Current antihyperlipidemic treatment includes statins. The current treatment provides moderate improvement of lipids. Risk factors for coronary artery disease include diabetes mellitus, dyslipidemia, male sex, hypertension and a sedentary lifestyle.  Arthritis Presents for follow-up visit. He complains of pain and stiffness. His pain is at a severity of 3/10. Pertinent negatives include no fever.  Benign Prostatic Hypertrophy This is a chronic problem. The current episode started more than 1 year ago. Irritative symptoms include nocturia (2-3).  Anemia Presents for follow-up visit. Symptoms include malaise/fatigue. There has been no fever.  Constipation This is a chronic problem. The current episode started more than 1 year ago. The problem has been resolved since onset. His stool frequency is 2 to 3 times per week. Pertinent negatives include no fever. He has tried laxatives for the symptoms. The treatment provided moderate relief.     Review of Systems  Constitutional:  Positive for malaise/fatigue. Negative for fever.  HENT:  Positive for postnasal drip. Negative for ear pain and sore throat.   Eyes:  Negative for blurred vision.   Respiratory:  Positive for cough and wheezing. Negative for shortness of breath.   Gastrointestinal:  Positive for constipation and heartburn.  Genitourinary:  Positive for nocturia (2-3).  Musculoskeletal:  Positive for arthritis and stiffness.  Neurological:  Positive for headaches (slightly).  All other systems reviewed and are negative.     Objective:   Physical Exam Vitals reviewed.  Constitutional:      General: He is not in acute distress.    Appearance: He is well-developed.  HENT:     Head: Normocephalic.     Right Ear: Tympanic membrane normal.     Left Ear: Tympanic membrane normal.  Eyes:     General:        Right eye: No discharge.        Left eye: No discharge.     Pupils: Pupils are equal, round, and reactive to light.  Neck:     Thyroid: No thyromegaly.  Cardiovascular:     Rate and Rhythm: Normal rate and regular rhythm.     Heart sounds: Normal heart sounds. No murmur heard. Pulmonary:     Effort: Pulmonary effort is normal. No respiratory distress.     Breath sounds: Normal breath sounds. No wheezing.  Abdominal:     General: Bowel sounds are normal. There is no distension.     Palpations: Abdomen is soft.     Tenderness: There is no abdominal tenderness.  Musculoskeletal:        General: No tenderness. Normal range of motion.     Cervical back: Normal range of motion and neck supple.  Skin:    General: Skin is warm and dry.     Findings: No erythema or rash.  Neurological:     Mental Status: He is alert and oriented to person, place, and time.     Cranial Nerves: No cranial nerve deficit.     Deep Tendon Reflexes: Reflexes are normal and symmetric.  Psychiatric:        Behavior: Behavior normal.        Thought Content: Thought content normal.        Judgment: Judgment normal.      BP 118/69   Pulse 81   Temp 97.8 F (36.6 C) (Temporal)   Ht $R'5\' 5"'ss$  (1.651 m)   Wt 138 lb 9.6 oz (62.9 kg)   BMI 23.06 kg/m      Assessment & Plan:   Jimmy Craig comes in today with chief complaint of Medical Management of Chronic Issues (One sitting for long time getting up he feels like he is falling. Been going on for years but happens randomly. Heart doctor says its not the heart. Just blood pressure when it happens but it was not that.) and Cough (Has to spit every 5-10 min and nose stopped up x 4weeks )   Diagnosis and orders addressed:  1. Hypertension associated with diabetes ([facility]) - CMP14+EGFR - CBC with Differential/Platelet  2. GERD without esophagitis - CMP14+EGFR - CBC with Differential/Platelet  3. Type 2 diabetes mellitus with diabetic polyneuropathy, with long-term current use of insulin (HCC) - insulin degludec (TRESIBA FLEXTOUCH) 100 UNIT/ML FlexTouch Pen; INJECT 42 UNITS SUBCUTANEOUSLY ONCE DAILY**DISCONTINUE LEVEMIR**  Dispense: 15 mL; Refill: 1 - Dulaglutide (TRULICITY) 3 GY/5.6LS SOPN; Inject 3 mg into the skin once a week. (NEEDS TO BE SEEN BEFORE NEXT REFILL)  Dispense: 3 mL; Refill:  0 - Bayer DCA Hb A1c Waived - CMP14+EGFR - CBC with Differential/Platelet  4. Hyperlipidemia associated with type 2 diabetes mellitus (HCC) - CMP14+EGFR - CBC with Differential/Platelet  5. Primary osteoarthritis involving multiple joints - CMP14+EGFR - CBC with Differential/Platelet  6. Benign prostatic hyperplasia with nocturia - CMP14+EGFR - CBC with Differential/Platelet  7. Iron deficiency anemia, unspecified iron deficiency anemia type - CMP14+EGFR - CBC with Differential/Platelet  8. Constipation, unspecified constipation type - CMP14+EGFR - CBC with Differential/Platelet  9. Neuropathy - gabapentin (NEURONTIN) 600 MG tablet; Take 1 tablet (600 mg total) by mouth 3 (three) times daily.  Dispense: 90 tablet; Refill: 3  10. Need for immunization against influenza - Flu Vaccine QUAD High Dose(Fluad)  11. Allergic rhinitis due to pollen, unspecified seasonality - fluticasone (FLONASE) 50 MCG/ACT nasal  spray; Place 2 sprays into both nostrils daily.  Dispense: 16 g; Refill: 6 - cetirizine (ZYRTEC) 10 MG tablet; Take 1 tablet (10 mg total) by mouth daily.  Dispense: 30 tablet; Refill: 11   Labs pending Health Maintenance reviewed Diet and exercise encouraged  Follow up plan: 3 months   Evelina Dun, FNP

## 2021-04-24 ENCOUNTER — Encounter (HOSPITAL_COMMUNITY): Payer: Self-pay | Admitting: Physical Therapy

## 2021-04-24 ENCOUNTER — Ambulatory Visit (HOSPITAL_COMMUNITY): Payer: 59 | Admitting: Physical Therapy

## 2021-04-24 DIAGNOSIS — M545 Low back pain, unspecified: Secondary | ICD-10-CM

## 2021-04-24 DIAGNOSIS — R29898 Other symptoms and signs involving the musculoskeletal system: Secondary | ICD-10-CM

## 2021-04-24 DIAGNOSIS — M6281 Muscle weakness (generalized): Secondary | ICD-10-CM

## 2021-04-24 DIAGNOSIS — R262 Difficulty in walking, not elsewhere classified: Secondary | ICD-10-CM

## 2021-04-24 DIAGNOSIS — R2689 Other abnormalities of gait and mobility: Secondary | ICD-10-CM

## 2021-04-24 LAB — CMP14+EGFR
ALT: 22 IU/L (ref 0–44)
AST: 20 IU/L (ref 0–40)
Albumin/Globulin Ratio: 1.7 (ref 1.2–2.2)
Albumin: 4.3 g/dL (ref 3.8–4.8)
Alkaline Phosphatase: 72 IU/L (ref 44–121)
BUN/Creatinine Ratio: 30 — ABNORMAL HIGH (ref 10–24)
BUN: 26 mg/dL (ref 8–27)
Bilirubin Total: <0.2 mg/dL (ref 0.0–1.2)
CO2: 22 mmol/L (ref 20–29)
Calcium: 9.7 mg/dL (ref 8.6–10.2)
Chloride: 103 mmol/L (ref 96–106)
Creatinine, Ser: 0.88 mg/dL (ref 0.76–1.27)
Globulin, Total: 2.5 g/dL (ref 1.5–4.5)
Glucose: 107 mg/dL — ABNORMAL HIGH (ref 65–99)
Potassium: 4.4 mmol/L (ref 3.5–5.2)
Sodium: 140 mmol/L (ref 134–144)
Total Protein: 6.8 g/dL (ref 6.0–8.5)
eGFR: 94 mL/min/{1.73_m2} (ref >59)

## 2021-04-24 LAB — CBC WITH DIFFERENTIAL/PLATELET
Basophils Absolute: 0.1 10*3/uL (ref 0.0–0.2)
Basos: 1 %
EOS (ABSOLUTE): 0.1 10*3/uL (ref 0.0–0.4)
Eos: 2 %
Hematocrit: 40.3 % (ref 37.5–51.0)
Hemoglobin: 12.2 g/dL — ABNORMAL LOW (ref 13.0–17.7)
Immature Grans (Abs): 0.1 10*3/uL (ref 0.0–0.1)
Immature Granulocytes: 1 %
Lymphocytes Absolute: 2.1 10*3/uL (ref 0.7–3.1)
Lymphs: 32 %
MCH: 25.8 pg — ABNORMAL LOW (ref 26.6–33.0)
MCHC: 30.3 g/dL — ABNORMAL LOW (ref 31.5–35.7)
MCV: 85 fL (ref 79–97)
Monocytes Absolute: 0.6 10*3/uL (ref 0.1–0.9)
Monocytes: 9 %
Neutrophils Absolute: 3.6 10*3/uL (ref 1.4–7.0)
Neutrophils: 55 %
Platelets: 251 10*3/uL (ref 150–450)
RBC: 4.72 x10E6/uL (ref 4.14–5.80)
RDW: 17.1 % — ABNORMAL HIGH (ref 11.6–15.4)
WBC: 6.5 10*3/uL (ref 3.4–10.8)

## 2021-04-24 NOTE — Patient Instructions (Signed)
Access Code: [email or web address] URL: [email or web address] Date: 04/24/2021 Prepared by: Greig Castilla Trenace Coughlin  Exercises Prone Hip Extension - 1 x daily - 7 x weekly - 3 sets - 10 reps Supine Active Straight Leg Raise - 1 x daily - 7 x weekly - 3 sets - 10 reps

## 2021-04-24 NOTE — Therapy (Signed)
[facility] [address] [place], [place], [address] Phone: [phone]   Fax:  [phone]  Physical Therapy Treatment  Patient Details  Name: Jimmy Craig MRN: [id number] Date of Birth: Nov 29, 1950 Referring Provider (PT): Lonna Cobb, MD   Encounter Date: 04/24/2021   PT End of Session - 04/24/21 1445     Visit Number 3    Number of Visits 12    Date for PT Re-Evaluation 05/28/21    Authorization Type [employer], 30 CL combined PT/OT with 0 used, no auth    Authorization - Visit Number 3    Authorization - Number of Visits 30    PT Start Time 1445    PT Stop Time 1523    PT Time Calculation (min) 38 min    Activity Tolerance Patient tolerated treatment well    Behavior During Therapy [facility] for tasks assessed/performed             Past Medical History:  Diagnosis Date   Arthritis    BPH (benign prostatic hyperplasia)    Chronic back pain    GERD (gastroesophageal reflux disease)    Hypertension    Mixed hyperlipidemia    Type 2 diabetes mellitus (HCC)     Past Surgical History:  Procedure Laterality Date   CATARACT EXTRACTION Right    CATARACT EXTRACTION W/PHACO Left 12/20/2013   Procedure: CATARACT EXTRACTION PHACO AND INTRAOCULAR LENS PLACEMENT (IOC);  Surgeon: Susa Simmonds, MD;  Location: [facility];  Service: Ophthalmology;  Laterality: Left;  CDE: 0.97    There were no vitals filed for this visit.   Subjective Assessment - 04/24/21 1446     Subjective Patient states his pain is bother him. It started last night. Did exercises which helped a little bit.    Patient Stated Goals to be able to walk with less pain    Currently in Pain? Yes    Pain Score 4     Pain Location Back    Pain Orientation Left;Lower    Pain Descriptors / Indicators Aching    Pain Type Chronic pain    Pain Onset More than a month ago   >6 months   Pain Frequency Intermittent                                [facility] Adult PT Treatment/Exercise - 04/24/21 0001       Lumbar Exercises: Stretches   Press Ups 10 reps    Press Ups Limitations 2 sets      Lumbar Exercises: Supine   Bridge 10 reps;3 seconds    Bridge Limitations 2 sets    Straight Leg Raise 10 reps    Straight Leg Raises Limitations bilateral 2 sets    Large Ball Abdominal Isometric 10 reps;5 seconds    Large Ball Abdominal Isometric Limitations green ball    Other Supine Lumbar Exercises DKTC with green ball for abdominals 10x 5 second holds bilateral      Lumbar Exercises: Prone   Straight Leg Raise 10 reps;3 seconds    Straight Leg Raises Limitations cueing for controlled lowering, 3 sets                     PT Education - 04/24/21 1446     Education Details HEP    Person(s) Educated Patient    Methods Explanation    Comprehension Verbalized understanding  PT Short Term Goals - 04/16/21 1123       PT SHORT TERM GOAL #1   Title Patient will be able to demonstrate pain free lumbar motion    Time 3    Period Weeks    Status New    Target Date 05/07/21      PT SHORT TERM GOAL #2   Title Patient will report at least 50% improvement in overall symptoms and/or function to demonstrate improved functional mobility    Time 3    Period Weeks    Status New    Target Date 05/07/21      PT SHORT TERM GOAL #3   Title Patient will be independent in self management strategies to improve quality of life and functional outcomes.    Time 3    Period Weeks    Status New    Target Date 05/07/21               PT Long Term Goals - 04/16/21 1124       PT LONG TERM GOAL #1   Title Patient will report at least 75% improvement in symptoms for improved quality of life.    Time 6    Period Weeks    Status New    Target Date 05/28/21      PT LONG TERM GOAL #2   Title Patient will meet predicted FOTO score to demonstrate improved overall function.    Time 6    Period Weeks    Status New     Target Date 05/28/21      PT LONG TERM GOAL #3   Title Patient will be able to ambulate for at least 30 minutes with pain no greater than 1/10 in order to demonstrate improved ability to ambulate in the community.    Time 6    Period Weeks    Status New    Target Date 05/28/21                   Plan - 04/24/21 1445     Clinical Impression Statement Patient requires multimodal cueing for press up mechanics with fair carry over. Patient states slight improving in symptoms following press up in lumbar spine. Continued with core and hip strengthening requiring intermittent cueing for mechanics and controlled movements. Patient will continue benefit from skilled physical therapy in order to improve function and reduce impairment.    Personal Factors and Comorbidities Comorbidity 1;Comorbidity 2    Comorbidities hx lumbar surgery april 2022, DB    Examination-Activity Limitations Bed Mobility;Stairs;Stand;Transfers;Locomotion Level    Examination-Participation Restrictions Driving;Community Activity;Occupation;Cleaning    Stability/Clinical Decision Making Stable/Uncomplicated    Rehab Potential Good    PT Frequency 2x / week    PT Duration 6 weeks    PT Treatment/Interventions ADLs/Self Care Home Management;Aquatic Therapy;Electrical Stimulation;Cryotherapy;Moist Heat;Traction;Therapeutic exercise;Therapeutic activities;Stair training;Gait training;Neuromuscular re-education;Patient/family education;Manual techniques;Dry needling;Passive range of motion;Joint Manipulations    PT Next Visit Plan f/u with lumbar extension, glut strengthening, posture, stretches/ROM, trial traction, core strength    PT Home Exercise Plan POE, piriformis stretch, lumbar support in sitting; 9/15: bridge, LTR, piriformis in supine position. 9/20 SLR, hip ext    Consulted and Agree with Plan of Care Patient             Patient will benefit from skilled therapeutic intervention in order to improve the  following deficits and impairments:  Pain, Decreased strength, Difficulty walking, Decreased range of motion, Decreased mobility, Decreased activity tolerance,  Postural dysfunction, Improper body mechanics  Visit Diagnosis: Low back pain, unspecified back pain laterality, unspecified chronicity, unspecified whether sciatica present  Muscle weakness (generalized)  Difficulty in walking, not elsewhere classified  Other abnormalities of gait and mobility  Other symptoms and signs involving the musculoskeletal system     Problem List Patient Active Problem List   Diagnosis Date Noted   Constipation 06/01/2020   Osteoarthritis 04/26/2019   Hypertension associated with diabetes (HCC) 09/29/2018   Diabetes (HCC) 08/14/2018   Hyperlipidemia associated with type 2 diabetes mellitus (HCC) 08/14/2018   GERD without esophagitis 08/14/2018   BPH (benign prostatic hyperplasia) 08/14/2018   Iron deficiency anemia 08/14/2018   Dizzy spells 01/20/2015    3:26 PM, 04/24/21 Wyman Songster PT, DPT Physical Therapist at [facility]  [facility] [address] [place], [place], [address] Phone: [phone]   Fax:  [phone]  Name: Jimmy Craig MRN: [id number] Date of Birth: 03-Jun-1951

## 2021-04-26 ENCOUNTER — Other Ambulatory Visit: Payer: Self-pay | Admitting: Family

## 2021-04-26 ENCOUNTER — Telehealth (HOSPITAL_COMMUNITY): Payer: Self-pay

## 2021-04-26 ENCOUNTER — Ambulatory Visit (HOSPITAL_COMMUNITY): Payer: 59

## 2021-04-26 DIAGNOSIS — E785 Hyperlipidemia, unspecified: Secondary | ICD-10-CM

## 2021-04-26 DIAGNOSIS — M25559 Pain in unspecified hip: Secondary | ICD-10-CM

## 2021-04-26 DIAGNOSIS — M8949 Other hypertrophic osteoarthropathy, multiple sites: Secondary | ICD-10-CM

## 2021-04-26 DIAGNOSIS — M159 Polyosteoarthritis, unspecified: Secondary | ICD-10-CM

## 2021-04-26 DIAGNOSIS — E1169 Type 2 diabetes mellitus with other specified complication: Secondary | ICD-10-CM

## 2021-04-26 NOTE — Telephone Encounter (Signed)
No show #1, called and spoke to pt who thought apt was scheduled for tomorrow.  Reminded next apt date and time with contact number included.  Encouraged pt to call and reschedule/cancel apts if unable to make it in the future.  Becky Sax, LPTA/CLT; [place] [phone]

## 2021-04-30 ENCOUNTER — Ambulatory Visit (HOSPITAL_COMMUNITY): Payer: 59

## 2021-04-30 ENCOUNTER — Other Ambulatory Visit: Payer: Self-pay

## 2021-04-30 DIAGNOSIS — M545 Low back pain, unspecified: Secondary | ICD-10-CM

## 2021-04-30 DIAGNOSIS — R262 Difficulty in walking, not elsewhere classified: Secondary | ICD-10-CM

## 2021-04-30 DIAGNOSIS — R2689 Other abnormalities of gait and mobility: Secondary | ICD-10-CM

## 2021-04-30 DIAGNOSIS — R29898 Other symptoms and signs involving the musculoskeletal system: Secondary | ICD-10-CM

## 2021-04-30 DIAGNOSIS — M6281 Muscle weakness (generalized): Secondary | ICD-10-CM

## 2021-04-30 NOTE — Therapy (Signed)
[facility] [address] [place], [place], [address] Phone: [phone]   Fax:  [phone]  Physical Therapy Treatment  Patient Details  Name: Jimmy Craig MRN: [id number] Date of Birth: 02-01-1951 Referring Provider (PT): Lonna Cobb, MD   Encounter Date: 04/30/2021   PT End of Session - 04/30/21 1141     Visit Number 4    Number of Visits 12    Date for PT Re-Evaluation 05/28/21    Authorization Type [employer], 30 CL combined PT/OT with 0 used, no auth    Authorization - Visit Number 4    Authorization - Number of Visits 30    PT Start Time 1139    PT Stop Time 1220    PT Time Calculation (min) 41 min    Activity Tolerance Patient tolerated treatment well    Behavior During Therapy [facility] for tasks assessed/performed             Past Medical History:  Diagnosis Date   Arthritis    BPH (benign prostatic hyperplasia)    Chronic back pain    GERD (gastroesophageal reflux disease)    Hypertension    Mixed hyperlipidemia    Type 2 diabetes mellitus (HCC)     Past Surgical History:  Procedure Laterality Date   CATARACT EXTRACTION Right    CATARACT EXTRACTION W/PHACO Left 12/20/2013   Procedure: CATARACT EXTRACTION PHACO AND INTRAOCULAR LENS PLACEMENT (IOC);  Surgeon: Susa Simmonds, MD;  Location: [facility];  Service: Ophthalmology;  Laterality: Left;  CDE: 0.97    There were no vitals filed for this visit.   Subjective Assessment - 04/30/21 1226     Subjective Patient reports pain down leg occurs when he walks for 5-7 minutes. Achiness in low back occurs when he has been sitting for a period of time.    Patient Stated Goals to be able to walk with less pain    Currently in Pain? Yes    Pain Score 4     Pain Location Back    Pain Orientation Left;Posterior;Lower    Pain Radiating Towards left thigh    Pain Onset More than a month ago   >6 months              [facility] Adult PT Treatment/Exercise - 04/30/21  0001       Lumbar Exercises: Stretches   Single Knee to Chest Stretch Right;Left;1 rep;30 seconds    Hip Flexor Stretch Right;Left;30 seconds;2 reps      Lumbar Exercises: Standing   Row Strengthening;Both;10 reps;Theraband    Theraband Level (Row) Level 3 (Green)    Shoulder Extension Strengthening;Both;10 reps;Theraband    Theraband Level (Shoulder Extension) Level 3 (Green)    Other Standing Lumbar Exercises Pallof press GTB x10 each. NBOS      Lumbar Exercises: Supine   Bridge 10 reps;3 seconds    Bridge Limitations 2 sets      Lumbar Exercises: Prone   Single Arm Raise Right;Left;10 reps;2 seconds    Other Prone Lumbar Exercises elbow/knees plank 10" hold x10                     PT Education - 04/30/21 1227     Education Details Discussed purpose and technique if interventions throughout session. Advanced HEP.    Person(s) Educated Patient    Methods Explanation;Handout    Comprehension Verbalized understanding  PT Short Term Goals - 04/30/21 1232       PT SHORT TERM GOAL #1   Title Patient will be able to demonstrate pain free lumbar motion    Time 3    Period Weeks    Status On-going    Target Date 05/07/21      PT SHORT TERM GOAL #2   Title Patient will report at least 50% improvement in overall symptoms and/or function to demonstrate improved functional mobility    Time 3    Period Weeks    Status On-going    Target Date 05/07/21      PT SHORT TERM GOAL #3   Title Patient will be independent in self management strategies to improve quality of life and functional outcomes.    Time 3    Period Weeks    Status On-going    Target Date 05/07/21               PT Long Term Goals - 04/30/21 1232       PT LONG TERM GOAL #1   Title Patient will report at least 75% improvement in symptoms for improved quality of life.    Time 6    Period Weeks    Status On-going      PT LONG TERM GOAL #2   Title Patient will meet  predicted FOTO score to demonstrate improved overall function.    Time 6    Period Weeks    Status On-going      PT LONG TERM GOAL #3   Title Patient will be able to ambulate for at least 30 minutes with pain no greater than 1/10 in order to demonstrate improved ability to ambulate in the community.    Time 6    Period Weeks    Status On-going                   Plan - 04/30/21 1207     Clinical Impression Statement Session focused on core and lower extremity strengthening. Patient continues to require multimodal cuing for correct performance of therapeutic exercises and to slow down throughout session. Patient instructed in log roll. Patient exhibited decreased neuromotor control over transversus abdominus with multimodal cuing and attempting different exercises. Patient exhibited weakness during prone plank on knees but good form. Added single knee to chest and supine hip flexor stretch and prone plank on knees to HEP. Patient will continue benefit from skilled physical therapy in order to improve function and reduce impairment.    Personal Factors and Comorbidities Comorbidity 1;Comorbidity 2    Comorbidities hx lumbar surgery april 2022, DB    Examination-Activity Limitations Bed Mobility;Stairs;Stand;Transfers;Locomotion Level    Examination-Participation Restrictions Driving;Community Activity;Occupation;Cleaning    Stability/Clinical Decision Making Stable/Uncomplicated    Rehab Potential Good    PT Frequency 2x / week    PT Duration 6 weeks    PT Treatment/Interventions ADLs/Self Care Home Management;Aquatic Therapy;Electrical Stimulation;Cryotherapy;Moist Heat;Traction;Therapeutic exercise;Therapeutic activities;Stair training;Gait training;Neuromuscular re-education;Patient/family education;Manual techniques;Dry needling;Passive range of motion;Joint Manipulations    PT Next Visit Plan f/u with lumbar extension, glut strengthening, posture, stretches/ROM, trial traction,  core strength    PT Home Exercise Plan POE, piriformis stretch, lumbar support in sitting; 9/15: bridge, LTR, piriformis in supine position. 9/20 SLR, hip ext; 9/26 SKTC, hip flexor stretch, prone plank on knees    Consulted and Agree with Plan of Care Patient             Patient will benefit from  skilled therapeutic intervention in order to improve the following deficits and impairments:  Pain, Decreased strength, Difficulty walking, Decreased range of motion, Decreased mobility, Decreased activity tolerance, Postural dysfunction, Improper body mechanics  Visit Diagnosis: Low back pain, unspecified back pain laterality, unspecified chronicity, unspecified whether sciatica present  Muscle weakness (generalized)  Difficulty in walking, not elsewhere classified  Other abnormalities of gait and mobility  Other symptoms and signs involving the musculoskeletal system     Problem List Patient Active Problem List   Diagnosis Date Noted   Constipation 06/01/2020   Osteoarthritis 04/26/2019   Hypertension associated with diabetes (HCC) 09/29/2018   Diabetes (HCC) 08/14/2018   Hyperlipidemia associated with type 2 diabetes mellitus (HCC) 08/14/2018   GERD without esophagitis 08/14/2018   BPH (benign prostatic hyperplasia) 08/14/2018   Iron deficiency anemia 08/14/2018   Dizzy spells 01/20/2015   Britta Mccreedy D. Hartnett-Rands, MS, PT [place] [facility] [place] [phone]  Epifanio Lesches, PT 04/30/2021, 12:35 PM  [facility] [address] [place], [place], [address] Phone: [phone]   Fax:  [phone]  Name: Jimmy Craig MRN: [id number] Date of Birth: [birth year]

## 2021-04-30 NOTE — Patient Instructions (Addendum)
Low Back Stretch: One leg (Supine)    Lying on back, bring one knee toward chest by pulling gently behind knee. Hold _30_ seconds. Repeat with other leg.  Copyright  VHI. All rights reserved.   Hip Flexor Stretch    Lying on back near edge of bed, bend one leg, foot flat. Hang other leg over edge, relaxed, thigh resting entirely on bed for 1-2_ minutes. Repeat 1-2____ times. Do _1___ sessions per day. Advanced Exercise: Bend knee back keeping thigh in contact with bed.  http://gt2.exer.us/346   Copyright  VHI. All rights reserved.   Prone Plank (Eccentric)    On toes  (knees) and elbows, pull abdomen in while stabilizing trunk. Slowly lower downward without arching back. 10-15_ reps per set, _1__ sets per day.  http://ecce.exer.us/242   Copyright  VHI. All rights reserved.   Log Roll    Lying on back, bend left knee and place left arm across chest. Roll all in one movement to the right. Reverse to roll to the left. Always move as one unit.   Copyright  VHI. All rights reserved.

## 2021-05-02 ENCOUNTER — Other Ambulatory Visit: Payer: Self-pay

## 2021-05-02 ENCOUNTER — Encounter (HOSPITAL_COMMUNITY): Payer: Self-pay | Admitting: Physical Therapy

## 2021-05-02 ENCOUNTER — Ambulatory Visit (HOSPITAL_COMMUNITY): Payer: 59 | Admitting: Physical Therapy

## 2021-05-02 DIAGNOSIS — M6281 Muscle weakness (generalized): Secondary | ICD-10-CM

## 2021-05-02 DIAGNOSIS — M545 Low back pain, unspecified: Secondary | ICD-10-CM | POA: Diagnosis not present

## 2021-05-02 DIAGNOSIS — R2689 Other abnormalities of gait and mobility: Secondary | ICD-10-CM

## 2021-05-02 DIAGNOSIS — R262 Difficulty in walking, not elsewhere classified: Secondary | ICD-10-CM

## 2021-05-02 DIAGNOSIS — R29898 Other symptoms and signs involving the musculoskeletal system: Secondary | ICD-10-CM

## 2021-05-02 NOTE — Therapy (Signed)
[facility] [address] [place], [place], [address] Phone: [phone]   Fax:  [phone]  Physical Therapy Treatment  Patient Details  Name: Jimmy Craig MRN: [id number] Date of Birth: 1951-02-13 Referring Provider (PT): Lonna Cobb, MD   Encounter Date: 05/02/2021   PT End of Session - 05/02/21 1358     Visit Number 5    Number of Visits 12    Date for PT Re-Evaluation 05/28/21    Authorization Type [employer], 30 CL combined PT/OT with 0 used, no auth    Authorization - Visit Number 5    Authorization - Number of Visits 30    PT Start Time 1400    PT Stop Time 1440    PT Time Calculation (min) 40 min    Activity Tolerance Patient tolerated treatment well    Behavior During Therapy [facility] for tasks assessed/performed             Past Medical History:  Diagnosis Date   Arthritis    BPH (benign prostatic hyperplasia)    Chronic back pain    GERD (gastroesophageal reflux disease)    Hypertension    Mixed hyperlipidemia    Type 2 diabetes mellitus (HCC)     Past Surgical History:  Procedure Laterality Date   CATARACT EXTRACTION Right    CATARACT EXTRACTION W/PHACO Left 12/20/2013   Procedure: CATARACT EXTRACTION PHACO AND INTRAOCULAR LENS PLACEMENT (IOC);  Surgeon: Susa Simmonds, MD;  Location: [facility];  Service: Ophthalmology;  Laterality: Left;  CDE: 0.97    There were no vitals filed for this visit.   Subjective Assessment - 05/02/21 1359     Subjective He states symptoms have been the same. He is not doing exercises 100% but is doing some.    Patient Stated Goals to be able to walk with less pain    Currently in Pain? Yes    Pain Score 2     Pain Location Back    Pain Orientation Lower    Pain Descriptors / Indicators Aching    Pain Type Chronic pain    Pain Onset More than a month ago   >6 months               [facility] PT Assessment - 05/02/21 0001       Flexibility   Quadriceps able to  bring heel to buttock bilateral, added hip extension with overpressure without hip symptoms                           [facility] Adult PT Treatment/Exercise - 05/02/21 0001       Lumbar Exercises: Standing   Other Standing Lumbar Exercises march with green band at feet 2x 10 bilateral      Lumbar Exercises: Supine   Large Ball Abdominal Isometric 10 reps;5 seconds    Large Ball Abdominal Isometric Limitations green ball    Other Supine Lumbar Exercises DKTC with green ball for abdominals 10x 10 second holds bilateral      Lumbar Exercises: Prone   Straight Leg Raise 10 reps;3 seconds    Straight Leg Raises Limitations 2 sets, 2#    Other Prone Lumbar Exercises elbow/knees plank 10" hold x10                     PT Education - 05/02/21 1359     Education Details HEP    Person(s) Educated Patient  Methods Explanation    Comprehension Verbalized understanding              PT Short Term Goals - 04/30/21 1232       PT SHORT TERM GOAL #1   Title Patient will be able to demonstrate pain free lumbar motion    Time 3    Period Weeks    Status On-going    Target Date 05/07/21      PT SHORT TERM GOAL #2   Title Patient will report at least 50% improvement in overall symptoms and/or function to demonstrate improved functional mobility    Time 3    Period Weeks    Status On-going    Target Date 05/07/21      PT SHORT TERM GOAL #3   Title Patient will be independent in self management strategies to improve quality of life and functional outcomes.    Time 3    Period Weeks    Status On-going    Target Date 05/07/21               PT Long Term Goals - 04/30/21 1232       PT LONG TERM GOAL #1   Title Patient will report at least 75% improvement in symptoms for improved quality of life.    Time 6    Period Weeks    Status On-going      PT LONG TERM GOAL #2   Title Patient will meet predicted FOTO score to demonstrate improved overall  function.    Time 6    Period Weeks    Status On-going      PT LONG TERM GOAL #3   Title Patient will be able to ambulate for at least 30 minutes with pain no greater than 1/10 in order to demonstrate improved ability to ambulate in the community.    Time 6    Period Weeks    Status On-going                   Plan - 05/02/21 1358     Clinical Impression Statement Patient stating symptoms in low back, diffuse ache in anterior hip and pain into L thigh with walking. Patient with good quad and hip flexor length without reproduction of symptoms in prone. Patient with tenderness to palpation of L hip flexor but non concordant symptoms. Continued with core and hip strengthening. Was assessing lumbar spine segmental mobility for possible contribution to symptoms and patient stating it was sore as he recently had back surgery. States hx of bilateral LE symptoms which resolved since surgery but continued L proximal symptoms. Educated patient on possible hip contribution to symptoms vs. lumbar. Patient will continue to benefit from skilled physical therapy in order to reduce impairment and improve function.    Personal Factors and Comorbidities Comorbidity 1;Comorbidity 2    Comorbidities hx lumbar surgery april 2022, DB    Examination-Activity Limitations Bed Mobility;Stairs;Stand;Transfers;Locomotion Level    Examination-Participation Restrictions Driving;Community Activity;Occupation;Cleaning    Stability/Clinical Decision Making Stable/Uncomplicated    Rehab Potential Good    PT Frequency 2x / week    PT Duration 6 weeks    PT Treatment/Interventions ADLs/Self Care Home Management;Aquatic Therapy;Electrical Stimulation;Cryotherapy;Moist Heat;Traction;Therapeutic exercise;Therapeutic activities;Stair training;Gait training;Neuromuscular re-education;Patient/family education;Manual techniques;Dry needling;Passive range of motion;Joint Manipulations    PT Next Visit Plan glute strength, core  strength, further assess hip for possible OA contribution to symptoms    PT Home Exercise Plan POE, piriformis stretch, lumbar support in  sitting; 9/15: bridge, LTR, piriformis in supine position. 9/20 SLR, hip ext; 9/26 SKTC, hip flexor stretch, prone plank on knees    Consulted and Agree with Plan of Care Patient             Patient will benefit from skilled therapeutic intervention in order to improve the following deficits and impairments:  Pain, Decreased strength, Difficulty walking, Decreased range of motion, Decreased mobility, Decreased activity tolerance, Postural dysfunction, Improper body mechanics  Visit Diagnosis: Low back pain, unspecified back pain laterality, unspecified chronicity, unspecified whether sciatica present  Muscle weakness (generalized)  Difficulty in walking, not elsewhere classified  Other abnormalities of gait and mobility  Other symptoms and signs involving the musculoskeletal system     Problem List Patient Active Problem List   Diagnosis Date Noted   Constipation 06/01/2020   Osteoarthritis 04/26/2019   Hypertension associated with diabetes (HCC) 09/29/2018   Diabetes (HCC) 08/14/2018   Hyperlipidemia associated with type 2 diabetes mellitus (HCC) 08/14/2018   GERD without esophagitis 08/14/2018   BPH (benign prostatic hyperplasia) 08/14/2018   Iron deficiency anemia 08/14/2018   Dizzy spells 01/20/2015   2:50 PM, 05/02/21 Wyman Songster PT, DPT Physical Therapist at [facility]   [facility] [address] [place], [place], [address] Phone: [phone]   Fax:  [phone]  Name: Jimmy Craig MRN: [id number] Date of Birth: 1951-04-21

## 2021-05-04 ENCOUNTER — Ambulatory Visit (HOSPITAL_COMMUNITY): Payer: 59 | Admitting: Occupational Therapy

## 2021-05-04 ENCOUNTER — Other Ambulatory Visit: Payer: Self-pay

## 2021-05-04 ENCOUNTER — Encounter (HOSPITAL_COMMUNITY): Payer: Self-pay | Admitting: Occupational Therapy

## 2021-05-04 DIAGNOSIS — M545 Low back pain, unspecified: Secondary | ICD-10-CM | POA: Diagnosis not present

## 2021-05-04 DIAGNOSIS — R278 Other lack of coordination: Secondary | ICD-10-CM

## 2021-05-04 DIAGNOSIS — R29898 Other symptoms and signs involving the musculoskeletal system: Secondary | ICD-10-CM

## 2021-05-04 NOTE — Therapy (Signed)
[facility] [address] [place], [place], [address] Phone: [phone]   Fax:  [phone]  Occupational Therapy Evaluation  Patient Details  Name: Jimmy Craig MRN: [id number] Date of Birth: [birth year] Referring Provider (OT): Dr. Tressie Stalker   Encounter Date: 05/04/2021   OT End of Session - 05/04/21 1338     Visit Number 1    Number of Visits 4    Date for OT Re-Evaluation 06/03/21    Authorization Type [employer]    Authorization Time Period 30 visit limit PT/OT combined    Authorization - Visit Number 6    Authorization - Number of Visits 30    OT Start Time 1302    OT Stop Time 1332    OT Time Calculation (min) 30 min    Activity Tolerance Patient tolerated treatment well    Behavior During Therapy [facility] for tasks assessed/performed             Past Medical History:  Diagnosis Date   Arthritis    BPH (benign prostatic hyperplasia)    Chronic back pain    GERD (gastroesophageal reflux disease)    Hypertension    Mixed hyperlipidemia    Type 2 diabetes mellitus (HCC)     Past Surgical History:  Procedure Laterality Date   CATARACT EXTRACTION Right    CATARACT EXTRACTION W/PHACO Left 12/20/2013   Procedure: CATARACT EXTRACTION PHACO AND INTRAOCULAR LENS PLACEMENT (IOC);  Surgeon: Susa Simmonds, MD;  Location: [facility];  Service: Ophthalmology;  Laterality: Left;  CDE: 0.97    There were no vitals filed for this visit.   Subjective Assessment - 05/04/21 1336     Subjective  S: I drop things a lot.    Pertinent History Pt is a 70 y/o male presenting with left arm weakness, present for approximately 6 months. Pt reports occasional numbness and tingling in his forearm and hand. Pt was referred to occupational therapy for evaluation and treatment by Dr. Tressie Stalker.    Special Tests DASH: 27.27    Patient Stated Goals To hold things better.    Currently in Pain? No/denies               [facility] OT  Assessment - 05/04/21 1300       Assessment   Medical Diagnosis left arm weakness    Referring Provider (OT) Dr. Tressie Stalker    Onset Date/Surgical Date --   around 6 months   Hand Dominance Right    Next MD Visit 05/24/21    Prior Therapy None for this issue.      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      ADL   ADL comments Pt is having difficulty with holding items-often dropping keys, books, etc.      Observation/Other Assessments   Other Surveys  Select    Quick DASH  27.27      Coordination   9 Hole Peg Test Right;Left    Right 9 Hole Peg Test 24.96"    Left 9 Hole Peg Test 29.82"  ROM / Strength   AROM / PROM / Strength Strength      Strength   Overall Strength Comments Assessed seated, er/IR adducted    Strength Assessment Site Shoulder;Hand;Elbow    Right/Left Shoulder Left    Left Shoulder Flexion 5/5    Left Shoulder ABduction 4/5    Left Shoulder Internal Rotation 5/5    Left Shoulder External Rotation 4/5    Right/Left Elbow Left    Left Elbow Flexion 5/5    Left Elbow Extension 4/5    Right/Left hand Right;Left    Right Hand Gross Grasp Functional    Right Hand Grip (lbs) 65    Right Hand Lateral Pinch 14 lbs    Right Hand 3 Point Pinch 10 lbs    Left Hand Gross Grasp Functional    Left Hand Grip (lbs) 54    Left Hand Lateral Pinch 15 lbs    Left Hand 3 Point Pinch 8 lbs                 Neldon Mc - 05/04/21 1305     Open a tight or new jar Unable    Do heavy household chores (wash walls, wash floors) Mild difficulty    Carry a shopping bag or briefcase Mild difficulty    Wash your back No difficulty    Use a knife to cut  food Mild difficulty    Recreational activities in which you take some force or impact through your arm, shoulder, or hand (golf, hammering, tennis) Mild difficulty    During the past week, to what extent has your arm, shoulder or hand problem interfered with your normal social activities with family, friends, neighbors, or groups? Not at all    During the past week, to what extent has your arm, shoulder or hand problem limited your work or other regular daily activities Slightly    Arm, shoulder, or hand pain. None    Tingling (pins and needles) in your arm, shoulder, or hand Mild    Difficulty Sleeping Moderate difficulty    DASH Score 27.27 %                         OT Education - 05/04/21 1323     Education Details shoulder A/ROM, red theraputty grip strengthening    Person(s) Educated Patient    Methods Explanation;Demonstration;Handout    Comprehension Verbalized understanding;Returned demonstration              OT Short Term Goals - 05/04/21 1342       OT SHORT TERM GOAL #1   Title Pt will be provided with and educated on HEP to improve strength in LUE required for use as non-dominant during ADLs.    Time 4    Period Weeks    Status New    Target Date 06/03/21      OT SHORT TERM GOAL #2   Title Pt will be educated on wear and care as well as demonstrate independence in donning nighttime elbow extension splint.    Time 4    Period Weeks    Status New      OT SHORT TERM GOAL #3   Title Pt will increase left grip strength by 5# to improve ability to maintain grasp on objects when completing ADLs.    Time 4    Period Weeks    Status New      OT SHORT TERM GOAL #4  Title Pt will increase LUE strength to 5/5 throughout to improve ability to liftin weighted objects using LUE as assist.    Time 4    Period Weeks    Status New                      Plan - 05/04/21 1339     Clinical Impression Statement A: Pt is a 70 y/o male presenting  with left arm weakness present for approximately 6 months with no known origin. Pt reports dropping things and difficulty carrying objects. On assessment pt with strength WFL, LUE slightly weaker than RUE in some planes. Pt reports sleeping with elbow fully flexed and waking up with numbness in the arm. Educated on potential exacerbating factors such as nerve compression and to try and sleep with arm straight.    OT Occupational Profile and History Problem Focused Assessment - Including review of records relating to presenting problem    Occupational performance deficits (Please refer to evaluation for details): ADL's;IADL's;Leisure    Body Structure / Function / Physical Skills ADL;Endurance;UE functional use;Pain;IADL;Strength;Sensation    Rehab Potential Good    Clinical Decision Making Limited treatment options, no task modification necessary    Comorbidities Affecting Occupational Performance: None    Modification or Assistance to Complete Evaluation  No modification of tasks or assist necessary to complete eval    OT Frequency 1x / week    OT Duration 4 weeks    OT Treatment/Interventions Self-care/ADL training;Ultrasound;Patient/family education;Electrical Stimulation;Splinting;Moist Heat;Therapeutic exercise;Manual Therapy;Therapeutic activities    Plan P: Pt will benefit from skilled OT services to improve LUE strength and functional use as non-dominant during ADLs. Treatment plan: LUE strengthening, grip strengthening, splinting, fine motor coordination, modalities prn. NEXT SESSION: fabricate nighttime elbow extension splint   OT Home Exercise Plan eval: shoulder A/ROM, red theraputty    Consulted and Agree with Plan of Care Patient             Patient will benefit from skilled therapeutic intervention in order to improve the following deficits and impairments:   Body Structure / Function / Physical Skills: ADL, Endurance, UE functional use, Pain, IADL, Strength, Sensation        Visit Diagnosis: Other lack of coordination  Other symptoms and signs involving the musculoskeletal system    Problem List Patient Active Problem List   Diagnosis Date Noted   Constipation 06/01/2020   Osteoarthritis 04/26/2019   Hypertension associated with diabetes (HCC) 09/29/2018   Diabetes (HCC) 08/14/2018   Hyperlipidemia associated with type 2 diabetes mellitus (HCC) 08/14/2018   GERD without esophagitis 08/14/2018   BPH (benign prostatic hyperplasia) 08/14/2018   Iron deficiency anemia 08/14/2018   Dizzy spells 01/20/2015    Ezra Sites, OTR/L  [phone] 05/04/2021, 1:44 PM  [facility] [address] [place], [place], [address] Phone: [phone]   Fax:  [phone]  Name: Jimmy Craig MRN: [id number] Date of Birth: 1950/08/08

## 2021-05-04 NOTE — Patient Instructions (Signed)
Repeat all exercises 10-15 times, 1-2 times per day.  1) Shoulder Protraction    Begin with elbows by your side, slowly "punch" straight out in front of you.      2) Shoulder Flexion  Standing:         Begin with arms at your side with thumbs pointed up, slowly raise both arms up and forward towards overhead.               3) Horizontal abduction/adduction  Standing:           Begin with arms straight out in front of you, bring out to the side in at "T" shape. Keep arms straight entire time.                 4) Internal & External Rotation  Standing:     Stand with elbows at the side and elbows bent 90 degrees. Move your forearms away from your body, then bring back inward toward the body.     5) Shoulder Abduction  Standing:       Lying on your back begin with your arms flat on the table next to your side. Slowly move your arms out to the side so that they go overhead, in a jumping jack or snow angel movement.     Home Exercises Program Theraputty Exercises  Do the following exercises 2 times a day using your affected hand.  1. Roll putty into a ball.  2. Make into a pancake.  3. Roll putty into a roll.  4. Pinch along log with first finger and thumb.   5. Make into a ball.  6. Roll it back into a log.   7. Pinch using thumb and side of first finger.  8. Roll into a ball, then flatten into a pancake.  9. Using your fingers, make putty into a mountain.  10. Roll putty back into a ball and squeeze gently for 2-3 minutes.    

## 2021-05-08 ENCOUNTER — Other Ambulatory Visit: Payer: Self-pay

## 2021-05-08 ENCOUNTER — Ambulatory Visit (HOSPITAL_COMMUNITY): Payer: 59 | Attending: Neurosurgery | Admitting: Occupational Therapy

## 2021-05-08 ENCOUNTER — Encounter (HOSPITAL_COMMUNITY): Payer: Self-pay | Admitting: Occupational Therapy

## 2021-05-08 DIAGNOSIS — R2689 Other abnormalities of gait and mobility: Secondary | ICD-10-CM | POA: Insufficient documentation

## 2021-05-08 DIAGNOSIS — R278 Other lack of coordination: Secondary | ICD-10-CM | POA: Diagnosis present

## 2021-05-08 DIAGNOSIS — R29898 Other symptoms and signs involving the musculoskeletal system: Secondary | ICD-10-CM | POA: Diagnosis present

## 2021-05-08 DIAGNOSIS — R262 Difficulty in walking, not elsewhere classified: Secondary | ICD-10-CM | POA: Insufficient documentation

## 2021-05-08 DIAGNOSIS — M545 Low back pain, unspecified: Secondary | ICD-10-CM | POA: Insufficient documentation

## 2021-05-08 DIAGNOSIS — M6281 Muscle weakness (generalized): Secondary | ICD-10-CM | POA: Diagnosis present

## 2021-05-08 NOTE — Patient Instructions (Signed)
Theraputty Home Exercise Program  Complete 1-2 times a day.  putty squeeze  Pt. should squeeze putty in hand trying to keep it round by rotating putty after each squeeze. push fingers through putty to palm each time. Complete for ___2 to 3 __ minutes.   PUTTY KEY GRIP  Hold the putty at the top of your hand. Squeeze the putty between your thumb and the side of your 2nd finger as shown. Complete for ___2 to 3_____ minutes.    PUTTY 3 JAW CHUCK  Roll up some putty into a ball then flatten it. Then, firmly squeeze it with your first 3 fingers as shown. Complete for ___2 to 3___ minutes.

## 2021-05-08 NOTE — Therapy (Signed)
[facility] [address] [place], [place], [address] Phone: [phone]   Fax:  [phone]  Occupational Therapy Treatment  Patient Details  Name: Lin Glazier MRN: [id number] Date of Birth: November 01, 1950 Referring Provider (OT): Dr. Tressie Stalker   Encounter Date: 05/08/2021   OT End of Session - 05/08/21 1615     Visit Number 2    Number of Visits 4    Date for OT Re-Evaluation 06/03/21    Authorization Type [employer]    Authorization Time Period 30 visit limit PT/OT combined    Authorization - Visit Number 7    Authorization - Number of Visits 30    OT Start Time 1348    OT Stop Time 1430    OT Time Calculation (min) 42 min    Activity Tolerance Patient tolerated treatment well    Behavior During Therapy [facility] for tasks assessed/performed             Past Medical History:  Diagnosis Date   Arthritis    BPH (benign prostatic hyperplasia)    Chronic back pain    GERD (gastroesophageal reflux disease)    Hypertension    Mixed hyperlipidemia    Type 2 diabetes mellitus (HCC)     Past Surgical History:  Procedure Laterality Date   CATARACT EXTRACTION Right    CATARACT EXTRACTION W/PHACO Left 12/20/2013   Procedure: CATARACT EXTRACTION PHACO AND INTRAOCULAR LENS PLACEMENT (IOC);  Surgeon: Susa Simmonds, MD;  Location: [facility];  Service: Ophthalmology;  Laterality: Left;  CDE: 0.97    There were no vitals filed for this visit.   Subjective Assessment - 05/08/21 1351     Subjective  S: no pain    Currently in Pain? No/denies                [facility] OT Assessment - 05/08/21 0001       Assessment   Medical Diagnosis left arm weakness    Referring Provider (OT) Dr. Tressie Stalker      Precautions   Precautions None                      OT Treatments/Exercises (OP) - 05/08/21 0001       Exercises   Exercises Shoulder;Theraputty;Hand      Shoulder Exercises: Standing   Protraction  Strengthening;12 reps   2#   Horizontal ABduction Strengthening;12 reps   2#   External Rotation Strengthening;12 reps   2#   Internal Rotation Strengthening;12 reps   2#   Flexion Strengthening;12 reps   2#   ABduction Strengthening;12 reps   2#     Hand Exercises   Hand Gripper with Large Beads 55# pronated    Hand Gripper with Medium Beads 55# pronated    Hand Gripper with Small Beads 50# graded to 45# with pt completing less than half of beads prior to end of session. Pt reporting needing to decrease resistance more.      Theraputty   Theraputty - Flatten red putty    Theraputty - Roll red putty    Theraputty - Grip red putty   pronated and supinated     Splinting   Splinting Elbow extensoin night splint fabricated this session.      Fine Motor Coordination (Hand/Wrist)   Fine Motor Coordination Grooved pegs    Grooved pegs Working on in hand manipulation to transalte pegs from palm to pads of digits to place in grooved  areas. Verbal cuing needed with pt going back to placing single pegs without in hand manipulation. Minimal to moderate difficulty.                      OT Short Term Goals - 05/08/21 1616       OT SHORT TERM GOAL #1   Title Pt will be provided with and educated on HEP to improve strength in LUE required for use as non-dominant during ADLs.    Time 4    Period Weeks    Status On-going    Target Date 06/03/21      OT SHORT TERM GOAL #2   Title Pt will be educated on wear and care as well as demonstrate independence in donning nighttime elbow extension splint.    Time 4    Period Weeks    Status On-going      OT SHORT TERM GOAL #3   Title Pt will increase left grip strength by 5# to improve ability to maintain grasp on objects when completing ADLs.    Time 4    Period Weeks    Status On-going      OT SHORT TERM GOAL #4   Title Pt will increase LUE strength to 5/5 throughout to improve ability to liftin weighted objects using LUE as assist.     Time 4    Period Weeks    Status On-going                      Plan - 05/08/21 1617     Clinical Impression Statement A: Left elbow extension resting night splint fabricated for pt this date. Pt educated to let therapists know of any redness or areas of discomfort with the splint. Pt was able to complete shoulder strengthening using 2# dumbbells with good form with the exception of abduction which pt reported was difficulty leading to some compensation. Pt able to complete red putty grip exercises and provided handout for the task. Pt able to use hand gripper to pick up large and medium beads at 55# resistance but this needed to be graded to 45# for the small beads with pt still having difficulty completing less than half of the beads prior to end of session.    Body Structure / Function / Physical Skills ADL;Endurance;UE functional use;Pain;IADL;Strength;Sensation    OT Treatment/Interventions Self-care/ADL training;Ultrasound;Patient/family education;Electrical Stimulation;Splinting;Moist Heat;Therapeutic exercise;Manual Therapy;Therapeutic activities    Plan P: Continue shoulder and grip strengthening. Assess use of elbow splint at night. Correct any areas of discomfort.    OT Home Exercise Plan eval: shoulder A/ROM, red theraputty    Consulted and Agree with Plan of Care Patient             Patient will benefit from skilled therapeutic intervention in order to improve the following deficits and impairments:   Body Structure / Function / Physical Skills: ADL, Endurance, UE functional use, Pain, IADL, Strength, Sensation       Visit Diagnosis: Other symptoms and signs involving the musculoskeletal system  Muscle weakness (generalized)  Other lack of coordination    Problem List Patient Active Problem List   Diagnosis Date Noted   Constipation 06/01/2020   Osteoarthritis 04/26/2019   Hypertension associated with diabetes (HCC) 09/29/2018   Diabetes (HCC)  08/14/2018   Hyperlipidemia associated with type 2 diabetes mellitus (HCC) 08/14/2018   GERD without esophagitis 08/14/2018   BPH (benign prostatic hyperplasia) 08/14/2018   Iron deficiency anemia 08/14/2018  Dizzy spells 01/20/2015   Danie Chandler OT, MOT   Danie Chandler, OT/L 05/08/2021, 4:25 PM   [place] [address] [place], [place], [address] Phone: [phone]   Fax:  [phone]  Name: Latrell Potempa MRN: [id number] Date of Birth: [birth year]

## 2021-05-09 ENCOUNTER — Other Ambulatory Visit: Payer: Self-pay

## 2021-05-09 ENCOUNTER — Encounter (HOSPITAL_COMMUNITY): Payer: Self-pay | Admitting: Physical Therapy

## 2021-05-09 ENCOUNTER — Ambulatory Visit (HOSPITAL_COMMUNITY): Payer: 59 | Admitting: Physical Therapy

## 2021-05-09 DIAGNOSIS — R2689 Other abnormalities of gait and mobility: Secondary | ICD-10-CM

## 2021-05-09 DIAGNOSIS — R29898 Other symptoms and signs involving the musculoskeletal system: Secondary | ICD-10-CM | POA: Diagnosis not present

## 2021-05-09 DIAGNOSIS — M545 Low back pain, unspecified: Secondary | ICD-10-CM

## 2021-05-09 DIAGNOSIS — R262 Difficulty in walking, not elsewhere classified: Secondary | ICD-10-CM

## 2021-05-09 DIAGNOSIS — M6281 Muscle weakness (generalized): Secondary | ICD-10-CM

## 2021-05-09 NOTE — Therapy (Signed)
[facility] [address] [place], [place], [address] Phone: [phone]   Fax:  [phone]  Physical Therapy Treatment  Patient Details  Name: Jimmy Craig MRN: [id number] Date of Birth: 10/27/1950 Referring Provider (PT): Lonna Cobb, MD   Encounter Date: 05/09/2021   PT End of Session - 05/09/21 1445     Visit Number 6    Number of Visits 12    Date for PT Re-Evaluation 05/28/21    Authorization Type [employer], 30 CL combined PT/OT with 0 used, no auth    Authorization - Visit Number 8    Authorization - Number of Visits 30    PT Start Time 1445    PT Stop Time 1523    PT Time Calculation (min) 38 min    Activity Tolerance Patient tolerated treatment well    Behavior During Therapy [facility] for tasks assessed/performed             Past Medical History:  Diagnosis Date   Arthritis    BPH (benign prostatic hyperplasia)    Chronic back pain    GERD (gastroesophageal reflux disease)    Hypertension    Mixed hyperlipidemia    Type 2 diabetes mellitus (HCC)     Past Surgical History:  Procedure Laterality Date   CATARACT EXTRACTION Right    CATARACT EXTRACTION W/PHACO Left 12/20/2013   Procedure: CATARACT EXTRACTION PHACO AND INTRAOCULAR LENS PLACEMENT (IOC);  Surgeon: Susa Simmonds, MD;  Location: [facility];  Service: Ophthalmology;  Laterality: Left;  CDE: 0.97    There were no vitals filed for this visit.   Subjective Assessment - 05/09/21 1448     Subjective States that they looked at his hip last session. States that the pain has not gone away. Whenever he has more activity pain comes back.    Patient Stated Goals to be able to walk with less pain    Currently in Pain? Yes    Pain Location Leg    Pain Orientation Left    Pain Descriptors / Indicators Aching    Pain Radiating Towards groin and thigh    Pain Onset More than a month ago   >6 months               [facility] PT Assessment - 05/09/21 0001        Assessment   Medical Diagnosis LBP with left sciatica    Referring Provider (PT) Lonna Cobb, MD    Next MD Visit 05/24/21      Special Tests    Special Tests Hip Special Tests    Hip Special Tests  Hip Scouring;Patrick (FABER) Test      Luisa Hart [facility]) Test   Findings Positive    Side Left    Comments pain and reduced ER noted on left      Hip Scouring   Findings Positive    Side Left                           [facility] Adult PT Treatment/Exercise - 05/09/21 0001       Lumbar Exercises: Stretches   Hip Flexor Stretch Left;3 reps;30 seconds   leg over edge of bed     Lumbar Exercises: Supine   Bridge 10 reps;3 seconds    Bridge Limitations 2 sets    Other Supine Lumbar Exercises bent knee fall out x15 10" holds B    Other Supine Lumbar Exercises  hip IR (bent knee fall in) 3 minutes of 10" holds L      Manual Therapy   Manual Therapy Manual Traction    Manual therapy comments Manual complete separate than rest of tx    Manual Traction to left leg - long axis traction - tolerated well, lateral hip glide not tolerated well due to pressure on leg                     PT Education - 05/09/21 1453     Education Details on current presentation, on HEP, on rehab process, on findings    Person(s) Educated Patient    Methods Explanation    Comprehension Verbalized understanding              PT Short Term Goals - 04/30/21 1232       PT SHORT TERM GOAL #1   Title Patient will be able to demonstrate pain free lumbar motion    Time 3    Period Weeks    Status On-going    Target Date 05/07/21      PT SHORT TERM GOAL #2   Title Patient will report at least 50% improvement in overall symptoms and/or function to demonstrate improved functional mobility    Time 3    Period Weeks    Status On-going    Target Date 05/07/21      PT SHORT TERM GOAL #3   Title Patient will be independent in self management strategies to improve quality of  life and functional outcomes.    Time 3    Period Weeks    Status On-going    Target Date 05/07/21               PT Long Term Goals - 04/30/21 1232       PT LONG TERM GOAL #1   Title Patient will report at least 75% improvement in symptoms for improved quality of life.    Time 6    Period Weeks    Status On-going      PT LONG TERM GOAL #2   Title Patient will meet predicted FOTO score to demonstrate improved overall function.    Time 6    Period Weeks    Status On-going      PT LONG TERM GOAL #3   Title Patient will be able to ambulate for at least 30 minutes with pain no greater than 1/10 in order to demonstrate improved ability to ambulate in the community.    Time 6    Period Weeks    Status On-going                   Plan - 05/09/21 1450     Clinical Impression Statement Assessed left hip and notable increase in pain with scour and FABER test as well as ROM deficits. Educated patient on presentation and focused on hip mobility as well as glute activation with bridge which was tolerated better after the [employer]. Reduced pain noted with walking, will continue with hip extensor/abductor activation and joint mobility as tolerated.    Personal Factors and Comorbidities Comorbidity 1;Comorbidity 2    Comorbidities hx lumbar surgery april 2022, DB    Examination-Activity Limitations Bed Mobility;Stairs;Stand;Transfers;Locomotion Level    Examination-Participation Restrictions Driving;Community Activity;Occupation;Cleaning    Stability/Clinical Decision Making Stable/Uncomplicated    Rehab Potential Good    PT Frequency 2x / week    PT Duration 6 weeks  PT Treatment/Interventions ADLs/Self Care Home Management;Aquatic Therapy;Electrical Stimulation;Cryotherapy;Moist Heat;Traction;Therapeutic exercise;Therapeutic activities;Stair training;Gait training;Neuromuscular re-education;Patient/family education;Manual techniques;Dry needling;Passive range of  motion;Joint Manipulations    PT Next Visit Plan hip joint mobility, hip flexor stretch, glute strengthening    PT Home Exercise Plan POE, piriformis stretch, lumbar support in sitting; 9/15: bridge, LTR, piriformis in supine position. 9/20 SLR, hip ext; 9/26 SKTC, hip flexor stretch, prone plank on knees    Consulted and Agree with Plan of Care Patient             Patient will benefit from skilled therapeutic intervention in order to improve the following deficits and impairments:  Pain, Decreased strength, Difficulty walking, Decreased range of motion, Decreased mobility, Decreased activity tolerance, Postural dysfunction, Improper body mechanics  Visit Diagnosis: Muscle weakness (generalized)  Low back pain, unspecified back pain laterality, unspecified chronicity, unspecified whether sciatica present  Difficulty in walking, not elsewhere classified  Other abnormalities of gait and mobility     Problem List Patient Active Problem List   Diagnosis Date Noted   Constipation 06/01/2020   Osteoarthritis 04/26/2019   Hypertension associated with diabetes (HCC) 09/29/2018   Diabetes (HCC) 08/14/2018   Hyperlipidemia associated with type 2 diabetes mellitus (HCC) 08/14/2018   GERD without esophagitis 08/14/2018   BPH (benign prostatic hyperplasia) 08/14/2018   Iron deficiency anemia 08/14/2018   Dizzy spells 01/20/2015   3:27 PM, 05/09/21 Tereasa Coop, DPT Physical Therapy with [facility]  [phone] office   [facility] [place] [address] [place], [place], [address] Phone: [phone]   Fax:  [phone]  Name: Jimmy Craig MRN: [id number] Date of Birth: 06/01/1951

## 2021-05-11 ENCOUNTER — Encounter (HOSPITAL_COMMUNITY): Payer: Self-pay | Admitting: Physical Therapy

## 2021-05-11 ENCOUNTER — Other Ambulatory Visit: Payer: Self-pay

## 2021-05-11 ENCOUNTER — Ambulatory Visit (HOSPITAL_COMMUNITY): Payer: 59 | Admitting: Physical Therapy

## 2021-05-11 ENCOUNTER — Telehealth: Payer: Self-pay | Admitting: Family

## 2021-05-11 DIAGNOSIS — M545 Low back pain, unspecified: Secondary | ICD-10-CM

## 2021-05-11 DIAGNOSIS — R262 Difficulty in walking, not elsewhere classified: Secondary | ICD-10-CM

## 2021-05-11 DIAGNOSIS — R29898 Other symptoms and signs involving the musculoskeletal system: Secondary | ICD-10-CM | POA: Diagnosis not present

## 2021-05-11 DIAGNOSIS — M6281 Muscle weakness (generalized): Secondary | ICD-10-CM

## 2021-05-11 DIAGNOSIS — R2689 Other abnormalities of gait and mobility: Secondary | ICD-10-CM

## 2021-05-11 NOTE — Telephone Encounter (Signed)
No voice mail.

## 2021-05-11 NOTE — Therapy (Signed)
[facility] [address] [place], [place], [address] Phone: [phone]   Fax:  [phone]  Physical Therapy Treatment  Patient Details  Name: Jimmy Craig MRN: [id number] Date of Birth: [birth year] Referring Provider (PT): Lonna Cobb, MD   Encounter Date: 05/11/2021   PT End of Session - 05/11/21 1401     Visit Number 7    Number of Visits 12    Date for PT Re-Evaluation 05/28/21    Authorization Type [employer], 30 CL combined PT/OT with 0 used, no auth    Authorization - Visit Number 9    Authorization - Number of Visits 30    PT Start Time 1402    PT Stop Time 1440    PT Time Calculation (min) 38 min    Activity Tolerance Patient tolerated treatment well    Behavior During Therapy [facility] for tasks assessed/performed             Past Medical History:  Diagnosis Date   Arthritis    BPH (benign prostatic hyperplasia)    Chronic back pain    GERD (gastroesophageal reflux disease)    Hypertension    Mixed hyperlipidemia    Type 2 diabetes mellitus (HCC)     Past Surgical History:  Procedure Laterality Date   CATARACT EXTRACTION Right    CATARACT EXTRACTION W/PHACO Left 12/20/2013   Procedure: CATARACT EXTRACTION PHACO AND INTRAOCULAR LENS PLACEMENT (IOC);  Surgeon: Susa Simmonds, MD;  Location: [facility];  Service: Ophthalmology;  Laterality: Left;  CDE: 0.97    There were no vitals filed for this visit.   Subjective Assessment - 05/11/21 1408     Subjective States that he did some exercises but not everyday. States that he still has the same pain with walking 8 minutes    Patient Stated Goals to be able to walk with less pain    Currently in Pain? Yes    Pain Score 3     Pain Location Hip    Pain Orientation Left    Pain Descriptors / Indicators Aching    Pain Type Chronic pain    Pain Onset More than a month ago   >6 months               [facility] PT Assessment - 05/11/21 0001       Assessment    Medical Diagnosis LBP with left sciatica    Referring Provider (PT) Lonna Cobb, MD    Next MD Visit 05/24/21                           [facility] Adult PT Treatment/Exercise - 05/11/21 0001       Ambulation/Gait   Gait Comments walking on treadmill - poor form - walked outside - 12 minutes total  -cues to stand tall, swinging arms, engaging glutes      Lumbar Exercises: Stretches   Hip Flexor Stretch Left;3 reps;30 seconds   leg over edge of bed     Lumbar Exercises: Supine   Bridge 10 reps;3 seconds    Bridge Limitations 2 sets      Lumbar Exercises: Prone   Other Prone Lumbar Exercises knee flexion x10 10" holds B; prone heel press 2x10 5" hold; hip IR/ER x2 each                       PT Short Term Goals - 04/30/21  1232       PT SHORT TERM GOAL #1   Title Patient will be able to demonstrate pain free lumbar motion    Time 3    Period Weeks    Status On-going    Target Date 05/07/21      PT SHORT TERM GOAL #2   Title Patient will report at least 50% improvement in overall symptoms and/or function to demonstrate improved functional mobility    Time 3    Period Weeks    Status On-going    Target Date 05/07/21      PT SHORT TERM GOAL #3   Title Patient will be independent in self management strategies to improve quality of life and functional outcomes.    Time 3    Period Weeks    Status On-going    Target Date 05/07/21               PT Long Term Goals - 04/30/21 1232       PT LONG TERM GOAL #1   Title Patient will report at least 75% improvement in symptoms for improved quality of life.    Time 6    Period Weeks    Status On-going      PT LONG TERM GOAL #2   Title Patient will meet predicted FOTO score to demonstrate improved overall function.    Time 6    Period Weeks    Status On-going      PT LONG TERM GOAL #3   Title Patient will be able to ambulate for at least 30 minutes with pain no greater than 1/10 in  order to demonstrate improved ability to ambulate in the community.    Time 6    Period Weeks    Status On-going                   Plan - 05/11/21 1410     Clinical Impression Statement Patient continues to have poor HEP adherence at home. Reiterated importance of HEP adherence in order to have positive response from physical therapy. Continued with hip mobility and glute activation exercises. Added in standing exercises to help with adherence as he can do them throughout the day. No increase in pain noted during session. Focused on gait training and able to walk outside with out pain with cues,m encouraged patient to continue to practice this, Will continue with current POC as tolerated.    Personal Factors and Comorbidities Comorbidity 1;Comorbidity 2    Comorbidities hx lumbar surgery april 2022, DB    Examination-Activity Limitations Bed Mobility;Stairs;Stand;Transfers;Locomotion Level    Examination-Participation Restrictions Driving;Community Activity;Occupation;Cleaning    Stability/Clinical Decision Making Stable/Uncomplicated    Rehab Potential Good    PT Frequency 2x / week    PT Duration 6 weeks    PT Treatment/Interventions ADLs/Self Care Home Management;Aquatic Therapy;Electrical Stimulation;Cryotherapy;Moist Heat;Traction;Therapeutic exercise;Therapeutic activities;Stair training;Gait training;Neuromuscular re-education;Patient/family education;Manual techniques;Dry needling;Passive range of motion;Joint Manipulations    PT Next Visit Plan f/u with gait mechanics, hip joint mobility, hip flexor stretch, glute strengthening    PT Home Exercise Plan POE, piriformis stretch, lumbar support in sitting; 9/15: bridge, LTR, piriformis in supine position. 9/20 SLR, hip ext; 9/26 SKTC, hip flexor stretch, prone plank on knees; 10/7 gait mechanics    Consulted and Agree with Plan of Care Patient             Patient will benefit from skilled therapeutic intervention in order  to improve the following deficits and  impairments:  Pain, Decreased strength, Difficulty walking, Decreased range of motion, Decreased mobility, Decreased activity tolerance, Postural dysfunction, Improper body mechanics  Visit Diagnosis: Muscle weakness (generalized)  Low back pain, unspecified back pain laterality, unspecified chronicity, unspecified whether sciatica present  Difficulty in walking, not elsewhere classified  Other abnormalities of gait and mobility     Problem List Patient Active Problem List   Diagnosis Date Noted   Constipation 06/01/2020   Osteoarthritis 04/26/2019   Hypertension associated with diabetes (HCC) 09/29/2018   Diabetes (HCC) 08/14/2018   Hyperlipidemia associated with type 2 diabetes mellitus (HCC) 08/14/2018   GERD without esophagitis 08/14/2018   BPH (benign prostatic hyperplasia) 08/14/2018   Iron deficiency anemia 08/14/2018   Dizzy spells 01/20/2015    2:44 PM, 05/11/21 Tereasa Coop, DPT Physical Therapy with [facility]  [phone] office   [facility] [address] [place], [place], [address] Phone: [phone]   Fax:  [phone]  Name: Jimmy Craig MRN: [id number] Date of Birth: Nov 25, 1950

## 2021-05-15 ENCOUNTER — Ambulatory Visit (HOSPITAL_COMMUNITY): Payer: 59 | Admitting: Physical Therapy

## 2021-05-15 ENCOUNTER — Other Ambulatory Visit: Payer: Self-pay

## 2021-05-15 DIAGNOSIS — R2689 Other abnormalities of gait and mobility: Secondary | ICD-10-CM

## 2021-05-15 DIAGNOSIS — R262 Difficulty in walking, not elsewhere classified: Secondary | ICD-10-CM

## 2021-05-15 DIAGNOSIS — M545 Low back pain, unspecified: Secondary | ICD-10-CM

## 2021-05-15 DIAGNOSIS — M6281 Muscle weakness (generalized): Secondary | ICD-10-CM

## 2021-05-15 DIAGNOSIS — R29898 Other symptoms and signs involving the musculoskeletal system: Secondary | ICD-10-CM | POA: Diagnosis not present

## 2021-05-15 NOTE — Telephone Encounter (Signed)
No answer, no voicemail.

## 2021-05-15 NOTE — Patient Instructions (Signed)
Access Code: [email or web address] URL: [email or web address] Date: 05/15/2021 Prepared by: Georges Lynch  Exercises Seated Sidebending Arms Overhead - 2 x daily - 7 x weekly - 1 sets - 10 reps - 10 second hold Seated Lumbar Flexion Stretch - 2 x daily - 7 x weekly - 1 sets - 10 reps - 10 second hold Sit to Stand Without Arm Support - 2 x daily - 7 x weekly - 2 sets - 10 reps

## 2021-05-15 NOTE — Therapy (Signed)
[facility] [address] [place], [place], [address] Phone: [phone]   Fax:  [phone]  Physical Therapy Treatment  Patient Details  Name: Jimmy Craig MRN: [id number] Date of Birth: 01/04/1951 Referring Provider (PT): Lonna Cobb, MD   Encounter Date: 05/15/2021   PT End of Session - 05/15/21 1119     Visit Number 8    Number of Visits 12    Date for PT Re-Evaluation 05/28/21    Authorization Type [employer], 30 CL combined PT/OT with 0 used, no auth    Authorization - Visit Number 10    Authorization - Number of Visits 30    PT Start Time 1118    PT Stop Time 1156    PT Time Calculation (min) 38 min    Activity Tolerance Patient tolerated treatment well    Behavior During Therapy [facility] for tasks assessed/performed             Past Medical History:  Diagnosis Date   Arthritis    BPH (benign prostatic hyperplasia)    Chronic back pain    GERD (gastroesophageal reflux disease)    Hypertension    Mixed hyperlipidemia    Type 2 diabetes mellitus (HCC)     Past Surgical History:  Procedure Laterality Date   CATARACT EXTRACTION Right    CATARACT EXTRACTION W/PHACO Left 12/20/2013   Procedure: CATARACT EXTRACTION PHACO AND INTRAOCULAR LENS PLACEMENT (IOC);  Surgeon: Susa Simmonds, MD;  Location: [facility];  Service: Ophthalmology;  Laterality: Left;  CDE: 0.97    There were no vitals filed for this visit.   Subjective Assessment - 05/15/21 1121     Subjective Patient says he tried to walk as instructed. He feels pain is not totally gone but does feel better.    Patient Stated Goals to be able to walk with less pain    Currently in Pain? Yes    Pain Score 2     Pain Location Hip    Pain Orientation Left;Anterior;Lateral    Pain Descriptors / Indicators Aching    Pain Type Chronic pain    Pain Onset More than a month ago   >6 months   Pain Frequency Intermittent                                [facility] Adult PT Treatment/Exercise - 05/15/21 0001       Ambulation/Gait   Gait Comments 2 laps around gym; warm up and mid treatment as reassess      Lumbar Exercises: Stretches   Passive Hamstring Stretch Left;3 reps;20 seconds    Standing Side Bend Right;Left;5 reps;10 seconds   done seated   Other Lumbar Stretch Exercise seated lumbar flexion; 10 second hold x 10      Lumbar Exercises: Standing   Forward Lunge 5 reps;Other (comment)   10 second hold;   Other Standing Lumbar Exercises forward step ups, 4" step x20 each leg, HHAx1;  standing hip abduction, HHAx1, 20 each side    Other Standing Lumbar Exercises Pallof press 2x 10 each  with NBOS red TB      Lumbar Exercises: Seated   Sit to Stand 10 reps   no UE support; 3 sets     Shoulder Exercises: Standing   Extension Strengthening;Both;15 reps   2 sets x 15   Theraband Level (Shoulder Extension) Level 2 (Red)    Row Strengthening;Both;15  reps   2 sets x 15   Theraband Level (Shoulder Row) Level 2 (Red)                       PT Short Term Goals - 04/30/21 1232       PT SHORT TERM GOAL #1   Title Patient will be able to demonstrate pain free lumbar motion    Time 3    Period Weeks    Status On-going    Target Date 05/07/21      PT SHORT TERM GOAL #2   Title Patient will report at least 50% improvement in overall symptoms and/or function to demonstrate improved functional mobility    Time 3    Period Weeks    Status On-going    Target Date 05/07/21      PT SHORT TERM GOAL #3   Title Patient will be independent in self management strategies to improve quality of life and functional outcomes.    Time 3    Period Weeks    Status On-going    Target Date 05/07/21               PT Long Term Goals - 04/30/21 1232       PT LONG TERM GOAL #1   Title Patient will report at least 75% improvement in symptoms for improved quality of life.    Time 6    Period  Weeks    Status On-going      PT LONG TERM GOAL #2   Title Patient will meet predicted FOTO score to demonstrate improved overall function.    Time 6    Period Weeks    Status On-going      PT LONG TERM GOAL #3   Title Patient will be able to ambulate for at least 30 minutes with pain no greater than 1/10 in order to demonstrate improved ability to ambulate in the community.    Time 6    Period Weeks    Status On-going                   Plan - 05/15/21 1158     Clinical Impression Statement Patient tolerated session well overall today. Patient did well with all added activity, though did note increased LT hip fatigue/ discomfort during standing hip abduction. Patient cued on TA activation during standing core series. Patient noting overall improvement in pain level at end of session. Issued updated HEP handout. Patient will continue to benefit from skilled therapy services to progress hip and core strengthening for reduced pain and improved functional ability with ADLs.    Personal Factors and Comorbidities Comorbidity 1;Comorbidity 2    Comorbidities hx lumbar surgery april 2022, DB    Examination-Activity Limitations Bed Mobility;Stairs;Stand;Transfers;Locomotion Level    Examination-Participation Restrictions Driving;Community Activity;Occupation;Cleaning    Stability/Clinical Decision Making Stable/Uncomplicated    Rehab Potential Good    PT Frequency 2x / week    PT Duration 6 weeks    PT Treatment/Interventions ADLs/Self Care Home Management;Aquatic Therapy;Electrical Stimulation;Cryotherapy;Moist Heat;Traction;Therapeutic exercise;Therapeutic activities;Stair training;Gait training;Neuromuscular re-education;Patient/family education;Manual techniques;Dry needling;Passive range of motion;Joint Manipulations    PT Next Visit Plan f/u with gait mechanics, hip joint mobility, hip flexor stretch, glute strengthening    PT Home Exercise Plan POE, piriformis stretch, lumbar  support in sitting; 9/15: bridge, LTR, piriformis in supine position. 9/20 SLR, hip ext; 9/26 SKTC, hip flexor stretch, prone plank on knees; 10/7 gait mechanics 10/11 seated side  bend, lumbar flexion stretch seated, sit to stand    Consulted and Agree with Plan of Care Patient             Patient will benefit from skilled therapeutic intervention in order to improve the following deficits and impairments:  Pain, Decreased strength, Difficulty walking, Decreased range of motion, Decreased mobility, Decreased activity tolerance, Postural dysfunction, Improper body mechanics  Visit Diagnosis: Muscle weakness (generalized)  Low back pain, unspecified back pain laterality, unspecified chronicity, unspecified whether sciatica present  Difficulty in walking, not elsewhere classified  Other abnormalities of gait and mobility     Problem List Patient Active Problem List   Diagnosis Date Noted   Constipation 06/01/2020   Osteoarthritis 04/26/2019   Hypertension associated with diabetes (HCC) 09/29/2018   Diabetes (HCC) 08/14/2018   Hyperlipidemia associated with type 2 diabetes mellitus (HCC) 08/14/2018   GERD without esophagitis 08/14/2018   BPH (benign prostatic hyperplasia) 08/14/2018   Iron deficiency anemia 08/14/2018   Dizzy spells 01/20/2015   12:08 PM, 05/15/21 Jimmy Craig PT DPT  Physical Therapist with [facility]  [phone]   [facility] [address] [place], [place], [address] Phone: [phone]   Fax:  [phone]  Name: Jimmy Craig MRN: [id number] Date of Birth: [birth year]

## 2021-05-16 ENCOUNTER — Ambulatory Visit (HOSPITAL_COMMUNITY): Payer: 59 | Admitting: Occupational Therapy

## 2021-05-16 ENCOUNTER — Encounter (HOSPITAL_COMMUNITY): Payer: Self-pay | Admitting: Occupational Therapy

## 2021-05-16 DIAGNOSIS — R278 Other lack of coordination: Secondary | ICD-10-CM

## 2021-05-16 DIAGNOSIS — R29898 Other symptoms and signs involving the musculoskeletal system: Secondary | ICD-10-CM

## 2021-05-16 DIAGNOSIS — M6281 Muscle weakness (generalized): Secondary | ICD-10-CM

## 2021-05-16 NOTE — Therapy (Signed)
[facility] [address] [place], [place], [address] Phone: [phone]   Fax:  [phone]  Occupational Therapy Treatment  Patient Details  Name: Jimmy Craig MRN: [id number] Date of Birth: [birth year] Referring Provider (OT): Dr. Tressie Stalker   Encounter Date: 05/16/2021   OT End of Session - 05/16/21 1404     Visit Number 3    Number of Visits 4    Date for OT Re-Evaluation 06/03/21    Authorization Type [employer]    Authorization Time Period 30 visit limit PT/OT combined    Authorization - Visit Number 8    Authorization - Number of Visits 30    OT Start Time 1308    OT Stop Time 1347    OT Time Calculation (min) 39 min    Activity Tolerance Patient tolerated treatment well    Behavior During Therapy [facility] for tasks assessed/performed             Past Medical History:  Diagnosis Date   Arthritis    BPH (benign prostatic hyperplasia)    Chronic back pain    GERD (gastroesophageal reflux disease)    Hypertension    Mixed hyperlipidemia    Type 2 diabetes mellitus (HCC)     Past Surgical History:  Procedure Laterality Date   CATARACT EXTRACTION Right    CATARACT EXTRACTION W/PHACO Left 12/20/2013   Procedure: CATARACT EXTRACTION PHACO AND INTRAOCULAR LENS PLACEMENT (IOC);  Surgeon: Susa Simmonds, MD;  Location: [facility];  Service: Ophthalmology;  Laterality: Left;  CDE: 0.97    There were no vitals filed for this visit.   Subjective Assessment - 05/16/21 1307     Subjective  S: no pain; pt reports improved sleeping wearing elbow extension splint through the night    Currently in Pain? No/denies                [facility] OT Assessment - 05/16/21 0001       Assessment   Medical Diagnosis LBP with left sciatica    Referring Provider (OT) Dr. Tressie Stalker    Next MD Visit 05/24/21      Precautions   Precautions None                      OT Treatments/Exercises (OP) - 05/16/21  0001       Exercises   Exercises Shoulder;Elbow;Hand;Theraputty      Shoulder Exercises: Standing   Protraction Strengthening;12 reps    Protraction Weight (lbs) 3#    Horizontal ABduction Strengthening;12 reps;Theraband   3#; green 12;   Theraband Level (Shoulder Horizontal ABduction) Level 3 (Green)    Horizontal ABduction Weight (lbs) 3#    External Rotation Strengthening;12 reps   3#   Theraband Level (Shoulder External Rotation) Level 3 (Green)    External Rotation Weight (lbs) 3#    Internal Rotation Strengthening;12 reps    Internal Rotation Weight (lbs) 3#    Flexion Strengthening;12 reps   3#   Theraband Level (Shoulder Flexion) Level 3 (Green)    Shoulder Flexion Weight (lbs) 3#    ABduction Strengthening;12 reps;Theraband   3#; green ; same for adduction   Theraband Level (Shoulder ABduction) Level 3 (Green)    Shoulder ABduction Weight (lbs) 3#    Extension 12 reps;Theraband   geeen   Theraband Level (Shoulder Extension) Level 3 (Green)      Shoulder Exercises: ROM/Strengthening   UBE (Upper Arm Bike) 1'forward 1'reverse ;  level 5 ; 18 pace forward; 15 pace reverse   pt fatigued after a minute but was able to continue with 1' of reverse.     Elbow Exercises   Elbow Extension Left;Theraband   x12, green     Additional Elbow Exercises   Theraputty - Roll red putty    Theraputty - Grip red putty   pronated and supinated   Hand Gripper with Large Beads 55# pronated    Hand Gripper with Medium Beads 55# pronated    Hand Gripper with Small Beads attempted 45# graded to 37#; pronated      Fine Motor Coordination (Hand/Wrist)   Fine Motor Coordination Manipulating coins;Grooved pegs    Manipulating coins inserting coins translating coins from palm to pads of fingers; good maniuplation without dropping of coins    Grooved pegs Working on in hand manipulation to transalte pegs from palm to pads of digits to place in grooved areas. Minimal to moderate dropping of pegs this  date. Mild improvements.                    OT Education - 05/16/21 1403     Education Details shoulder strengthening with green theraband; elbow extension as well    Person(s) Educated Patient    Methods Explanation;Demonstration;Handout    Comprehension Verbalized understanding;Returned demonstration              OT Short Term Goals - 05/08/21 1616       OT SHORT TERM GOAL #1   Title Pt will be provided with and educated on HEP to improve strength in LUE required for use as non-dominant during ADLs.    Time 4    Period Weeks    Status On-going    Target Date 06/03/21      OT SHORT TERM GOAL #2   Title Pt will be educated on wear and care as well as demonstrate independence in donning nighttime elbow extension splint.    Time 4    Period Weeks    Status On-going      OT SHORT TERM GOAL #3   Title Pt will increase left grip strength by 5# to improve ability to maintain grasp on objects when completing ADLs.    Time 4    Period Weeks    Status On-going      OT SHORT TERM GOAL #4   Title Pt will increase LUE strength to 5/5 throughout to improve ability to liftin weighted objects using LUE as assist.    Time 4    Period Weeks    Status On-going                      Plan - 05/16/21 1358     Clinical Impression Statement A: Pt reported that his sleep has improved by donning the elbow extension splint through the night. This date pt complete UE shoulder strengthening useing 3# weights and green theraband. Elbow extension also targeted with green theraband. Pt demonstrated improved strength with shoulder abduction that was difficult for the pt last session. Pt demonstrated good fine motor skills to inert pennied into the piggy bank, but minimal to moderate difficulty still noted with the same task using the grooved pegs into the peg board. Pt completed large gripper exercises grading task down to 37# for small beads. Pt showed mild improvement but  still struggled as seen by dropping pegs. Pt ended session with UBE bike at level 5 with pt  fatiguing after 1 minute forward but able to complete one minute reverse.    Occupational performance deficits (Please refer to evaluation for details): ADL's;IADL's;Leisure    Body Structure / Function / Physical Skills ADL;Endurance;UE functional use;Pain;IADL;Strength;Sensation    OT Treatment/Interventions Self-care/ADL training;Ultrasound;Patient/family education;Electrical Stimulation;Splinting;Moist Heat;Therapeutic exercise;Manual Therapy;Therapeutic activities    Plan P: Reassess; possibly d/c    OT Home Exercise Plan eval: shoulder A/ROM, red theraputty; green theraband shoulder strengthening and elbow extension    Consulted and Agree with Plan of Care Patient             Patient will benefit from skilled therapeutic intervention in order to improve the following deficits and impairments:   Body Structure / Function / Physical Skills: ADL, Endurance, UE functional use, Pain, IADL, Strength, Sensation       Visit Diagnosis: Muscle weakness (generalized)  Other symptoms and signs involving the musculoskeletal system  Other lack of coordination    Problem List Patient Active Problem List   Diagnosis Date Noted   Constipation 06/01/2020   Osteoarthritis 04/26/2019   Hypertension associated with diabetes (HCC) 09/29/2018   Diabetes (HCC) 08/14/2018   Hyperlipidemia associated with type 2 diabetes mellitus (HCC) 08/14/2018   GERD without esophagitis 08/14/2018   BPH (benign prostatic hyperplasia) 08/14/2018   Iron deficiency anemia 08/14/2018   Dizzy spells 01/20/2015   Danie Chandler OT, MOT   Danie Chandler, OT/L 05/16/2021, 2:07 PM  [facility] [address] [place], [place], [address] Phone: [phone]   Fax:  [phone]  Name: Jimmy Craig MRN: [id number] Date of Birth: 1951-07-15

## 2021-05-16 NOTE — Patient Instructions (Addendum)
    1) PNF Strengthening: Resisted Strengthening: Resisted Abduction   Hold tubing with right arm across body. Pull up and away from side. Move through pain-free range of motion. Repeat ___12_ times per set. Do ___2 to 3 _ sets per session. Do __2__ sessions per day. Do this for all.   http://orth.exer.us/826   Copyright  VHI. All rights reserved.    2) Strengthening: Resisted Adduction   Hold tubing in left hand, arm out. Pull arm toward opposite hip. Do not twist or rotate trunk. Repeat ____ times per set. Do ____ sets per session. Do ____ sessions per day.  http://orth.exer.us/834   Copyright  VHI. All rights reserved.   3) Strengthening: Resisted Extension   Hold tubing in right hand, arm forward. Pull arm back, elbow straight. Repeat ____ times per set. Do ____ sets per session. Do ____ sessions per day.  http://orth.exer.us/832   Copyright  VHI. All rights reserved.   4) Strengthening: Resisted External Rotation   Hold tubing in right hand, elbow at side and forearm across body. Rotate forearm out. Repeat ____ times per set. Do ____ sets per session. Do ____ sessions per day.  http://orth.exer.us/828   Copyright  VHI. All rights reserved.   5) Strengthening: Resisted Flexion   Hold tubing with left arm at side. Pull forward and up. Move shoulder through pain-free range of motion. Repeat ____ times per set. Do ____ sets per session. Do ____ sessions per day.  http://orth.exer.us/824   Copyright  VHI. All rights reserved.   6) Strengthening: Resisted Horizontal Abduction   Hold tubing in right hand, elbow straight, arm in, parallel to floor. Pull arm out from side through pain-free range. Repeat ____ times per set. Do ____ sets per session. Do ____ sessions per day.  http://orth.exer.us/836   Copyright  VHI. All rights reserved.      English       Elbow Flexion and Extension with Theraband  Hold band in front of you.  Straighten one  elbow and bend the other elbow.  Repeat on opposite side.     Repeat 12 Times Hold 1 Second(s) Complete 2 Sets Perform 1 Times a Day

## 2021-05-17 ENCOUNTER — Other Ambulatory Visit: Payer: Self-pay

## 2021-05-17 ENCOUNTER — Encounter (HOSPITAL_COMMUNITY): Payer: Self-pay | Admitting: Physical Therapy

## 2021-05-17 ENCOUNTER — Ambulatory Visit (HOSPITAL_COMMUNITY): Payer: 59 | Admitting: Physical Therapy

## 2021-05-17 DIAGNOSIS — M545 Low back pain, unspecified: Secondary | ICD-10-CM

## 2021-05-17 DIAGNOSIS — R2689 Other abnormalities of gait and mobility: Secondary | ICD-10-CM

## 2021-05-17 DIAGNOSIS — R29898 Other symptoms and signs involving the musculoskeletal system: Secondary | ICD-10-CM

## 2021-05-17 DIAGNOSIS — M6281 Muscle weakness (generalized): Secondary | ICD-10-CM

## 2021-05-17 DIAGNOSIS — R262 Difficulty in walking, not elsewhere classified: Secondary | ICD-10-CM

## 2021-05-17 NOTE — Therapy (Signed)
[facility] [address] [place], [place], [address] Phone: [phone]   Fax:  [phone]  Physical Therapy Treatment  Patient Details  Name: Jimmy Craig MRN: [id number] Date of Birth: [birth year] Referring Provider (PT): Lonna Cobb, MD   Encounter Date: 05/17/2021   PT End of Session - 05/17/21 1355     Visit Number 9    Number of Visits 12    Date for PT Re-Evaluation 05/28/21    Authorization Type [employer], 30 CL combined PT/OT with 0 used, no auth    Authorization - Visit Number 10    Authorization - Number of Visits 30    PT Start Time 1400    PT Stop Time 1438    PT Time Calculation (min) 38 min    Activity Tolerance Patient tolerated treatment well    Behavior During Therapy [facility] for tasks assessed/performed             Past Medical History:  Diagnosis Date   Arthritis    BPH (benign prostatic hyperplasia)    Chronic back pain    GERD (gastroesophageal reflux disease)    Hypertension    Mixed hyperlipidemia    Type 2 diabetes mellitus (HCC)     Past Surgical History:  Procedure Laterality Date   CATARACT EXTRACTION Right    CATARACT EXTRACTION W/PHACO Left 12/20/2013   Procedure: CATARACT EXTRACTION PHACO AND INTRAOCULAR LENS PLACEMENT (IOC);  Surgeon: Susa Simmonds, MD;  Location: [facility];  Service: Ophthalmology;  Laterality: Left;  CDE: 0.97    There were no vitals filed for this visit.   Subjective Assessment - 05/17/21 1357     Subjective States symptoms are getting better. He overall feels comfortable.    Patient Stated Goals to be able to walk with less pain    Currently in Pain? No/denies    Pain Onset More than a month ago   >6 months                              [facility] Adult PT Treatment/Exercise - 05/17/21 0001       Lumbar Exercises: Stretches   Standing Side Bend Right;Left;5 reps;10 seconds   done in seated   Other Lumbar Stretch Exercise seated lumbar  flexion; 10 second hold x 10      Lumbar Exercises: Standing   Forward Lunge 5 reps   10 second hold   Row Both;10 reps    Theraband Level (Row) Level 2 (Red)    Row Limitations cueing for glute and core activation, 2 sets    Shoulder Extension Both;10 reps    Theraband Level (Shoulder Extension) Level 2 (Red)    Shoulder Extension Limitations 2 sets    Other Standing Lumbar Exercises forward step ups, 6" step 2x20 each leg, HHAx1;  standing hip abduction, HHAx1, 20 each side 3#, standing hip ext 3# x 20 bilateral    Other Standing Lumbar Exercises Pallof press 2x 10 each  with NBOS red TB      Lumbar Exercises: Seated   Sit to Stand 10 reps   3 sets                    PT Education - 05/17/21 1357     Education Details HEP    Person(s) Educated Patient    Methods Explanation    Comprehension Verbalized understanding  PT Short Term Goals - 04/30/21 1232       PT SHORT TERM GOAL #1   Title Patient will be able to demonstrate pain free lumbar motion    Time 3    Period Weeks    Status On-going    Target Date 05/07/21      PT SHORT TERM GOAL #2   Title Patient will report at least 50% improvement in overall symptoms and/or function to demonstrate improved functional mobility    Time 3    Period Weeks    Status On-going    Target Date 05/07/21      PT SHORT TERM GOAL #3   Title Patient will be independent in self management strategies to improve quality of life and functional outcomes.    Time 3    Period Weeks    Status On-going    Target Date 05/07/21               PT Long Term Goals - 04/30/21 1232       PT LONG TERM GOAL #1   Title Patient will report at least 75% improvement in symptoms for improved quality of life.    Time 6    Period Weeks    Status On-going      PT LONG TERM GOAL #2   Title Patient will meet predicted FOTO score to demonstrate improved overall function.    Time 6    Period Weeks    Status On-going       PT LONG TERM GOAL #3   Title Patient will be able to ambulate for at least 30 minutes with pain no greater than 1/10 in order to demonstrate improved ability to ambulate in the community.    Time 6    Period Weeks    Status On-going                   Plan - 05/17/21 1355     Clinical Impression Statement Continued with trunk stretches for mobility completed last session. Patient tolerating increased reps of previously completed exercises. Intermittent cueing required for mechanics and sequencing. No c/o symptoms throughout session. Continued with core, hip and functional strengthening. Patient will continue to benefit from skilled physical therapy in order to improve function and reduce impairment.    Personal Factors and Comorbidities Comorbidity 1;Comorbidity 2    Comorbidities hx lumbar surgery april 2022, DB    Examination-Activity Limitations Bed Mobility;Stairs;Stand;Transfers;Locomotion Level    Examination-Participation Restrictions Driving;Community Activity;Occupation;Cleaning    Stability/Clinical Decision Making Stable/Uncomplicated    Rehab Potential Good    PT Frequency 2x / week    PT Duration 6 weeks    PT Treatment/Interventions ADLs/Self Care Home Management;Aquatic Therapy;Electrical Stimulation;Cryotherapy;Moist Heat;Traction;Therapeutic exercise;Therapeutic activities;Stair training;Gait training;Neuromuscular re-education;Patient/family education;Manual techniques;Dry needling;Passive range of motion;Joint Manipulations    PT Next Visit Plan f/u with gait mechanics, hip joint mobility, hip flexor stretch, glute strengthening    PT Home Exercise Plan POE, piriformis stretch, lumbar support in sitting; 9/15: bridge, LTR, piriformis in supine position. 9/20 SLR, hip ext; 9/26 SKTC, hip flexor stretch, prone plank on knees; 10/7 gait mechanics 10/11 seated side bend, lumbar flexion stretch seated, sit to stand    Consulted and Agree with Plan of Care Patient              Patient will benefit from skilled therapeutic intervention in order to improve the following deficits and impairments:  Pain, Decreased strength, Difficulty walking, Decreased range of motion, Decreased  mobility, Decreased activity tolerance, Postural dysfunction, Improper body mechanics  Visit Diagnosis: Muscle weakness (generalized)  Low back pain, unspecified back pain laterality, unspecified chronicity, unspecified whether sciatica present  Difficulty in walking, not elsewhere classified  Other abnormalities of gait and mobility  Other symptoms and signs involving the musculoskeletal system     Problem List Patient Active Problem List   Diagnosis Date Noted   Constipation 06/01/2020   Osteoarthritis 04/26/2019   Hypertension associated with diabetes (HCC) 09/29/2018   Diabetes (HCC) 08/14/2018   Hyperlipidemia associated with type 2 diabetes mellitus (HCC) 08/14/2018   GERD without esophagitis 08/14/2018   BPH (benign prostatic hyperplasia) 08/14/2018   Iron deficiency anemia 08/14/2018   Dizzy spells 01/20/2015    2:40 PM, 05/17/21 Wyman Songster PT, DPT Physical Therapist at [facility]    [facility] [address] [place], [place], [address] Phone: [phone]   Fax:  [phone]  Name: Avan Gullett MRN: [id number] Date of Birth: 1951-06-12

## 2021-05-18 ENCOUNTER — Other Ambulatory Visit: Payer: Self-pay | Admitting: Family

## 2021-05-18 DIAGNOSIS — E1142 Type 2 diabetes mellitus with diabetic polyneuropathy: Secondary | ICD-10-CM

## 2021-05-18 DIAGNOSIS — Z794 Long term (current) use of insulin: Secondary | ICD-10-CM

## 2021-05-21 ENCOUNTER — Ambulatory Visit (HOSPITAL_COMMUNITY): Payer: 59

## 2021-05-21 ENCOUNTER — Encounter (HOSPITAL_COMMUNITY): Payer: Self-pay

## 2021-05-21 ENCOUNTER — Other Ambulatory Visit: Payer: Self-pay

## 2021-05-21 DIAGNOSIS — M6281 Muscle weakness (generalized): Secondary | ICD-10-CM

## 2021-05-21 DIAGNOSIS — R262 Difficulty in walking, not elsewhere classified: Secondary | ICD-10-CM

## 2021-05-21 DIAGNOSIS — M545 Low back pain, unspecified: Secondary | ICD-10-CM

## 2021-05-21 DIAGNOSIS — R29898 Other symptoms and signs involving the musculoskeletal system: Secondary | ICD-10-CM | POA: Diagnosis not present

## 2021-05-21 NOTE — Therapy (Addendum)
[facility] [address] [place], [place], [address] Phone: [phone]   Fax:  [phone]  Physical Therapy Treatment and RECERT  Patient Details  Name: Jimmy Craig MRN: [id number] Date of Birth: 1951-01-26 Referring Provider (PT): Lonna Cobb, MD  Progress Note Reporting Period 04/16/21 to 05/21/21  See note below for Objective Data and Assessment of Progress/Goals.   4:55 PM, 05/29/21 Tereasa Coop, DPT Physical Therapy with [facility]  [phone] office     Encounter Date: 05/21/2021   PT End of Session - 05/21/21 1443     Visit Number 10    Number of Visits 20    Date for PT Re-Evaluation 06/25/21    Authorization Type [employer], 30 CL combined PT/OT with 0 used, no auth    Authorization - Visit Number 13   combined wiht OT   Authorization - Number of Visits 30    PT Start Time 1432    PT Stop Time 1510    PT Time Calculation (min) 38 min    Activity Tolerance Patient tolerated treatment well    Behavior During Therapy WFL for tasks assessed/performed             Past Medical History:  Diagnosis Date   Arthritis    BPH (benign prostatic hyperplasia)    Chronic back pain    GERD (gastroesophageal reflux disease)    Hypertension    Mixed hyperlipidemia    Type 2 diabetes mellitus (HCC)     Past Surgical History:  Procedure Laterality Date   CATARACT EXTRACTION Right    CATARACT EXTRACTION W/PHACO Left 12/20/2013   Procedure: CATARACT EXTRACTION PHACO AND INTRAOCULAR LENS PLACEMENT (IOC);  Surgeon: Susa Simmonds, MD;  Location: [facility];  Service: Ophthalmology;  Laterality: Left;  CDE: 0.97    There were no vitals filed for this visit.   Subjective Assessment - 05/21/21 1439     Subjective Pt reports he has difficulty placing Rt foot onto floor after sleeping on Rt side, reports pain resolved following walking for short duration but comes back following standing.  Has  Lt thigh pain with activity up to 3/10.  No reports of pain currently.  Feels he has improved 20% with therapy.    Pertinent History DB    Patient Stated Goals to be able to walk with less pain    Currently in Pain? No/denies                [facility] PT Assessment - 05/21/21 0001       Assessment   Medical Diagnosis LBP with left sciatica    Referring Provider (PT) Lonna Cobb, MD    Onset Date/Surgical Date --   around 6 months   Next MD Visit 05/31/21      Observation/Other Assessments   Focus on Therapeutic Outcomes (FOTO)  46.25% functional   was 37% function     AROM   Lumbar Flexion WNL stretch not pain   was 75% limited- tension in legs   Lumbar Extension WNL pain free   was 75% limited, pain reduced   Lumbar - Right Side Bend 15% limited Lt knee pain   25% limited pressure on Rt foot   Lumbar - Left Side Bend 25% limited Lt thigh/knee pain   75% limited no change     Strength   Right Hip Extension 4/5   was 4-   Left Hip Extension 4+/5   was 4-  Right Knee Flexion 4+/5   was 4/5   Left Knee Flexion 4+/5   was 4/5     Flexibility   Hamstrings Rt able to complete 105 degrees flexion wiht knee extenison; Lt hip flexion to 105 degress wihtout extension lag                                      PT Short Term Goals - 05/21/21 1443       PT SHORT TERM GOAL #1   Title Patient will be able to demonstrate pain free lumbar motion    Baseline 10/17:  See ROM, c/o Lt thigh/knee pain wiht sidebend    Status Partially Met      PT SHORT TERM GOAL #2   Title Patient will report at least 50% improvement in overall symptoms and/or function to demonstrate improved functional mobility    Baseline 05/21/21:  Reports improvements 20% overall    Status On-going      PT SHORT TERM GOAL #3   Title Patient will be independent in self management strategies to improve quality of life and functional outcomes.    Baseline 05/21/21:  Reports compliance with  HEP daily    Status Achieved               PT Long Term Goals - 05/21/21 1445       PT LONG TERM GOAL #1   Title Patient will report at least 75% improvement in symptoms for improved quality of life.    Baseline 05/21/21:  Reports improvements by 20%    Status On-going      PT LONG TERM GOAL #2   Title Patient will meet predicted FOTO score to demonstrate improved overall function.      PT LONG TERM GOAL #3   Title Patient will be able to ambulate for at least 30 minutes with pain no greater than 1/10 in order to demonstrate improved ability to ambulate in the community.    Baseline 05/21/21:  Ability to ambulate for 15 minutes prior arrival of pain to 4/10 Lt thigh/groin pain    Status On-going      PT LONG TERM GOAL #4   Title Patient will be able to complete 5x STS in under 11.4 seconds in order to reduce the risk of falls.    Baseline 05/21/21: 5STS 8.87"    Status Achieved                   Plan - 05/21/21 1506     Clinical Impression Statement Reviewed goals with the following findings:  Pt reports compliance with HEP and feels he has improved 20% with therapy.  Presents wiht improved ROM though does continues to c/o Lt thigh and knee pain.  Strength has improved noted with improved ability to complete 5STS in 8.87" wihtout HHA.  Pt reports ability to stand for 20 min and walk for 15 minutes prior increased pain.  Pt reports he has pain in Rt LE following sleeping that resolves with walking for 15 minutes then pain returns.  Pt wishes to continue PT to address Rt LE pain that bothers following sleeping, wishes to know of proper positions to improve.    Personal Factors and Comorbidities Comorbidity 1;Comorbidity 2    Comorbidities hx lumbar surgery april 2022, DB    Examination-Activity Limitations Bed Mobility;Stairs;Stand;Transfers;Locomotion Level    Examination-Participation Restrictions Driving;Community Activity;Occupation;Cleaning  Stability/Clinical  Decision Making Stable/Uncomplicated    Clinical Decision Making Low    Rehab Potential Good    PT Frequency 2x / week    PT Duration 4 weeks    PT Treatment/Interventions ADLs/Self Care Home Management;Aquatic Therapy;Electrical Stimulation;Cryotherapy;Moist Heat;Traction;Therapeutic exercise;Therapeutic activities;Stair training;Gait training;Neuromuscular re-education;Patient/family education;Manual techniques;Dry needling;Passive range of motion;Joint Manipulations    PT Next Visit Plan f/u with gait mechanics, hip joint mobility, hip flexor stretch, glute strengthening.  PT wishes to continue therapy to address Rt LE pain following sleeping    PT Home Exercise Plan POE, piriformis stretch, lumbar support in sitting; 9/15: bridge, LTR, piriformis in supine position. 9/20 SLR, hip ext; 9/26 SKTC, hip flexor stretch, prone plank on knees; 10/7 gait mechanics 10/11 seated side bend, lumbar flexion stretch seated, sit to stand    Consulted and Agree with Plan of Care Patient             Patient will benefit from skilled therapeutic intervention in order to improve the following deficits and impairments:  Pain, Decreased strength, Difficulty walking, Decreased range of motion, Decreased mobility, Decreased activity tolerance, Postural dysfunction, Improper body mechanics  Visit Diagnosis: Muscle weakness (generalized)  Low back pain, unspecified back pain laterality, unspecified chronicity, unspecified whether sciatica present  Difficulty in walking, not elsewhere classified     Problem List Patient Active Problem List   Diagnosis Date Noted   Constipation 06/01/2020   Osteoarthritis 04/26/2019   Hypertension associated with diabetes ([place]) 09/29/2018   Diabetes ([place]) 08/14/2018   Hyperlipidemia associated with type 2 diabetes mellitus ([place]) 08/14/2018   GERD without esophagitis 08/14/2018   BPH (benign prostatic hyperplasia) 08/14/2018   Iron deficiency anemia 08/14/2018   Dizzy  spells 01/20/2015   Ihor Austin, LPTA/CLT; CBIS [phone]  Aldona Lento, PTA 05/21/2021, 6:46 PM  [place] [address] [place], [place], [address] Phone: [phone]   Fax:  [phone]  Name: Jimmy Craig MRN: [id number] Date of Birth: [birth year]

## 2021-05-23 ENCOUNTER — Encounter (HOSPITAL_COMMUNITY): Payer: Self-pay | Admitting: Occupational Therapy

## 2021-05-23 ENCOUNTER — Other Ambulatory Visit: Payer: Self-pay

## 2021-05-23 ENCOUNTER — Ambulatory Visit (HOSPITAL_COMMUNITY): Payer: 59 | Admitting: Occupational Therapy

## 2021-05-23 DIAGNOSIS — R29898 Other symptoms and signs involving the musculoskeletal system: Secondary | ICD-10-CM | POA: Diagnosis not present

## 2021-05-23 DIAGNOSIS — R278 Other lack of coordination: Secondary | ICD-10-CM

## 2021-05-23 NOTE — Therapy (Signed)
[place], [place], [address] Phone: [phone]   Fax:  [phone]  Occupational Therapy Reassessment, Treatment, Discharge Summary   Patient Details  Name: Jimmy Craig MRN: [id number] Date of Birth: [birth year] Referring Provider (OT): Dr. Newman Pies   Encounter Date: 05/23/2021   OT End of Session - 05/23/21 1326     Visit Number 4    Number of Visits 4    Date for OT Re-Evaluation 06/03/21    Authorization Type [employer]    Authorization Time Period 30 visit limit PT/OT combined    Authorization - Visit Number 9    Authorization - Number of Visits 30    OT Start Time 1300    OT Stop Time 1325    OT Time Calculation (min) 25 min    Activity Tolerance Patient tolerated treatment well    Behavior During Therapy [facility] for tasks assessed/performed             Past Medical History:  Diagnosis Date   Arthritis    BPH (benign prostatic hyperplasia)    Chronic back pain    GERD (gastroesophageal reflux disease)    Hypertension    Mixed hyperlipidemia    Type 2 diabetes mellitus ([place])     Past Surgical History:  Procedure Laterality Date   CATARACT EXTRACTION Right    CATARACT EXTRACTION W/PHACO Left 12/20/2013   Procedure: CATARACT EXTRACTION PHACO AND INTRAOCULAR LENS PLACEMENT ([place]);  Surgeon: Williams Che, MD;  Location: [facility];  Service: Ophthalmology;  Laterality: Left;  CDE: 0.97    There were no vitals filed for this visit.   Subjective Assessment - 05/23/21 1258     Subjective  S: I have not been dropping things as much.    Currently in Pain? No/denies                [facility] OT Assessment - 05/23/21 1256       Assessment   Medical Diagnosis Left arm weakness      Precautions   Precautions None      Observation/Other Assessments   Quick DASH  11.36   27.27 previous     Coordination   Left 9 Hole Peg Test 27.31"   29.82"     Strength   Overall Strength  Comments Assessed seated, er/IR adducted    Strength Assessment Site Shoulder    Right/Left Shoulder Left    Left Shoulder Flexion 5/5   same as previous   Left Shoulder ABduction 4+/5   4/5 previous   Left Shoulder Internal Rotation 5/5   same as previous   Left Shoulder External Rotation 4+/5   4/5 previous   Left Elbow Extension 4+/5   4/5 previous   Left Hand Grip (lbs) 65   54 previous   Left Hand Lateral Pinch 15 lbs   same as previous   Left Hand 3 Point Pinch 8 lbs   same as previous               Katina Dung - 05/23/21 1310     Open a tight or new jar Moderate difficulty    Do heavy household chores (wash walls, wash floors) No difficulty    Carry a shopping bag or briefcase Mild difficulty    Wash your back No difficulty    Use a knife to cut food Mild difficulty    Recreational activities in which you take some force or impact through  your arm, shoulder, or hand (golf, hammering, tennis) Mild difficulty    During the past week, to what extent has your arm, shoulder or hand problem interfered with your normal social activities with family, friends, neighbors, or groups? Not at all    During the past week, to what extent has your arm, shoulder or hand problem limited your work or other regular daily activities Not at all    Arm, shoulder, or hand pain. None    Tingling (pins and needles) in your arm, shoulder, or hand None    Difficulty Sleeping No difficulty    DASH Score 11.36 %                  OT Treatments/Exercises (OP) - 05/23/21 1303       Exercises   Exercises Shoulder;Elbow;Hand;Theraputty      Additional Elbow Exercises   Theraputty - Flatten red putty-seated    Hand Gripper with Large Beads all beads gripper at 55# horizontal    Hand Gripper with Medium Beads all beads gripper at 55# horizontal    Hand Gripper with Small Beads all beads gripper at 55#, horizontal      Hand Exercises   Other Hand Exercises Pt using pvc pipe to cut circles  into red theraputty, working on sustained grip                      OT Short Term Goals - 05/23/21 1313       OT SHORT TERM GOAL #1   Title Pt will be provided with and educated on HEP to improve strength in LUE required for use as non-dominant during ADLs.    Time 4    Period Weeks    Status Achieved    Target Date 06/03/21      OT SHORT TERM GOAL #2   Title Pt will be educated on wear and care as well as demonstrate independence in donning nighttime elbow extension splint.    Time 4    Period Weeks    Status Achieved      OT SHORT TERM GOAL #3   Title Pt will increase left grip strength by 5# to improve ability to maintain grasp on objects when completing ADLs.    Time 4    Period Weeks    Status Achieved      OT SHORT TERM GOAL #4   Title Pt will increase LUE strength to 5/5 throughout to improve ability to liftin weighted objects using LUE as assist.    Time 4    Period Weeks    Status Not Met                      Plan - 05/23/21 1315     Clinical Impression Statement A: Reassessment completed this session, pt reports no pain or tingling sensations since wearing the elbow extension splint at night. Pt has met 3/4 goals and reports he has not been dropping items as frequently. He does have difficulty with opening jars and water bottles but uses his right hand as dominant so this is not an issue on a daily basis. Continued with grip strengthening this session, pt increasing small beads to 55# today. Pt reports HEP is going well, no questions. Pt is agreeable to discharge today and continue HEP at home.    Body Structure / Function / Physical Skills ADL;Endurance;UE functional use;Pain;IADL;Strength;Sensation    Plan P: discharge pt  OT Home Exercise Plan eval: shoulder A/ROM, red theraputty; green theraband shoulder strengthening and elbow extension    Consulted and Agree with Plan of Care Patient             Patient will benefit from  skilled therapeutic intervention in order to improve the following deficits and impairments:   Body Structure / Function / Physical Skills: ADL, Endurance, UE functional use, Pain, IADL, Strength, Sensation       Visit Diagnosis: Other symptoms and signs involving the musculoskeletal system  Other lack of coordination    Problem List Patient Active Problem List   Diagnosis Date Noted   Constipation 06/01/2020   Osteoarthritis 04/26/2019   Hypertension associated with diabetes ([place]) 09/29/2018   Diabetes ([place]) 08/14/2018   Hyperlipidemia associated with type 2 diabetes mellitus ([place]) 08/14/2018   GERD without esophagitis 08/14/2018   BPH (benign prostatic hyperplasia) 08/14/2018   Iron deficiency anemia 08/14/2018   Dizzy spells 01/20/2015    Guadelupe Sabin, OTR/L  [phone] 05/23/2021, 1:27 PM  [place] [address] [place], [place], [address] Phone: [phone]   Fax:  [phone]  Name: Dillin Lofgren MRN: [id number] Date of Birth: [birth year]   OCCUPATIONAL THERAPY DISCHARGE SUMMARY  Visits from Start of Care: 4  Current functional level related to goals / functional outcomes: See above. Pt has met 3/4 goals and reports improvement in ability to maintain grip on objects at home.    Remaining deficits: Slightly decreased LUE strength compared to RUE for abduction, er, and elbow extension   Education / Equipment: HEP for LUE strengthening, grip strengthening   Patient agrees to discharge. Patient goals were met. Patient is being discharged due to meeting the stated rehab goals.Marland Kitchen

## 2021-05-28 ENCOUNTER — Encounter (HOSPITAL_COMMUNITY): Payer: 59

## 2021-05-29 NOTE — Addendum Note (Signed)
Addended by: Tereasa Coop R on: 05/29/2021 04:57 PM   Modules accepted: Orders

## 2021-05-30 ENCOUNTER — Encounter (HOSPITAL_COMMUNITY): Payer: Self-pay

## 2021-05-30 ENCOUNTER — Ambulatory Visit (HOSPITAL_COMMUNITY): Payer: 59

## 2021-05-30 ENCOUNTER — Other Ambulatory Visit: Payer: Self-pay

## 2021-05-30 DIAGNOSIS — R262 Difficulty in walking, not elsewhere classified: Secondary | ICD-10-CM

## 2021-05-30 DIAGNOSIS — M6281 Muscle weakness (generalized): Secondary | ICD-10-CM

## 2021-05-30 DIAGNOSIS — R29898 Other symptoms and signs involving the musculoskeletal system: Secondary | ICD-10-CM | POA: Diagnosis not present

## 2021-05-30 DIAGNOSIS — M545 Low back pain, unspecified: Secondary | ICD-10-CM

## 2021-05-30 NOTE — Patient Instructions (Signed)
Lower Trunk Rotation Stretch    Keeping back flat and feet together, rotate knees to left side. Hold 10 seconds. Repeat 5 times per set. Do 2 sets per day.  http://orth.exer.us/122   Copyright  VHI. All rights reserved.  Ankle Pump    With left leg elevated, gently flex and extend ankle. Move through full range of motion. Avoid pain. Repeat ____ times per set. Do ____ sets per session. Do ____ sessions per day.  http://orth.exer.us/32   Copyright  VHI. All rights reserved.   Knee to Chest (Flexion)    Pull knee toward chest. Feel stretch in lower back or buttock area. Breathing deeply, Hold 10 seconds. Repeat with other knee. Repeat 5 times. Do 2 sessions per day.  http://gt2.exer.us/225   Copyright  VHI. All rights reserved.   Heel Raise (Calf Strength / Balance)    Stand with support.  Breathe in. Rise up on tiptoes, breathing out through pursed lips.  Hold position to count of 3" Return slowly, breathing in. Repeat 10 times per session. Do 2 sessions per day. Variation: Do without weights.  Copyright  VHI. All rights reserved.

## 2021-05-30 NOTE — Therapy (Signed)
[place], [place], [address] Phone: [phone]   Fax:  [phone]  Physical Therapy Treatment  Patient Details  Name: Jimmy Craig MRN: [id number] Date of Birth: [birth year] Referring Provider (PT): Clayton Bibles, MD   Encounter Date: 05/30/2021   PT End of Session - 05/30/21 1330     Visit Number 11    Number of Visits 20    Date for PT Re-Evaluation 06/25/21    Authorization Type [employer], 30 CL combined PT/OT with 0 used, no auth    Authorization - Visit Number 14    Authorization - Number of Visits 30    PT Start Time 1316    PT Stop Time 1400    PT Time Calculation (min) 44 min    Activity Tolerance Patient tolerated treatment well    Behavior During Therapy [facility] for tasks assessed/performed             Past Medical History:  Diagnosis Date   Arthritis    BPH (benign prostatic hyperplasia)    Chronic back pain    GERD (gastroesophageal reflux disease)    Hypertension    Mixed hyperlipidemia    Type 2 diabetes mellitus ([place])     Past Surgical History:  Procedure Laterality Date   CATARACT EXTRACTION Right    CATARACT EXTRACTION W/PHACO Left 12/20/2013   Procedure: CATARACT EXTRACTION PHACO AND INTRAOCULAR LENS PLACEMENT ([place]);  Surgeon: Williams Che, MD;  Location: [facility];  Service: Ophthalmology;  Laterality: Left;  CDE: 0.97    There were no vitals filed for this visit.   Subjective Assessment - 05/30/21 1320     Subjective Pt continues to have difficulty Rt LE when initially wakes up, pain resolves folloiwng walking for 10-15 minutes.  Lt LE pain associated with more activity.    Pertinent History DB    Patient Stated Goals to be able to walk with less pain    Currently in Pain? No/denies                               [facility] Adult PT Treatment/Exercise - 05/30/21 0001       Lumbar Exercises: Stretches   Single Knee to Chest Stretch 5 reps;10  seconds    Lower Trunk Rotation 5 reps;10 seconds    Other Lumbar Stretch Exercise ankle pumps      Lumbar Exercises: Standing   Heel Raises 15 reps    Forward Lunge 10 reps   BUE flexion with core enganged   Side Lunge 10 reps    Side Lunge Limitations cueing for mechanics    Row Both;10 reps    Theraband Level (Row) Level 3 (Green)    Row Limitations cueing for core and glut activaoitn    Shoulder Extension Both;10 reps    Theraband Level (Shoulder Extension) Level 3 (Green)    Shoulder Extension Limitations Marching 10x 3" holds with GTB extension    Other Standing Lumbar Exercises modified dead lift with PVC pipe behind back 2x 10    Other Standing Lumbar Exercises Pallof press 2x 15 each  with NBOS on foam      Lumbar Exercises: Supine   Other Supine Lumbar Exercises Reviewed positions with pillow between knees for spinal alignement.                     PT Education - 05/30/21 1408  Education Details Discussed sleeping positions for increased comfort at night, encouraged to complete stretches in the morning, educated wiht ankle pumps for circulation.    Person(s) Educated Patient    Methods Explanation    Comprehension Verbalized understanding              PT Short Term Goals - 05/21/21 1443       PT SHORT TERM GOAL #1   Title Patient will be able to demonstrate pain free lumbar motion    Baseline 10/17:  See ROM, c/o Lt thigh/knee pain wiht sidebend    Status Partially Met      PT SHORT TERM GOAL #2   Title Patient will report at least 50% improvement in overall symptoms and/or function to demonstrate improved functional mobility    Baseline 05/21/21:  Reports improvements 20% overall    Status On-going      PT SHORT TERM GOAL #3   Title Patient will be independent in self management strategies to improve quality of life and functional outcomes.    Baseline 05/21/21:  Reports compliance with HEP daily    Status Achieved               PT  Long Term Goals - 05/21/21 1445       PT LONG TERM GOAL #1   Title Patient will report at least 75% improvement in symptoms for improved quality of life.    Baseline 05/21/21:  Reports improvements by 20%    Status On-going      PT LONG TERM GOAL #2   Title Patient will meet predicted FOTO score to demonstrate improved overall function.      PT LONG TERM GOAL #3   Title Patient will be able to ambulate for at least 30 minutes with pain no greater than 1/10 in order to demonstrate improved ability to ambulate in the community.    Baseline 05/21/21:  Ability to ambulate for 15 minutes prior arrival of pain to 4/10 Lt thigh/groin pain    Status On-going      PT LONG TERM GOAL #4   Title Patient will be able to complete 5x STS in under 11.4 seconds in order to reduce the risk of falls.    Baseline 05/21/21: 5STS 8.87"    Status Achieved                   Plan - 05/30/21 1414     Clinical Impression Statement Reviewed sleeping positions for pain control following resting in LE.  Encouraged pt to complete stretches and ankle mobility for circulation and mobility to assist with pain.  Therex focus iwht functional strengthening, core and proximal strengthening.  Cueing for core engangement for lumbar stability with tasks.  No reports of pain through session.    Personal Factors and Comorbidities Comorbidity 1;Comorbidity 2    Comorbidities hx lumbar surgery april 2022, DB    Examination-Activity Limitations Bed Mobility;Stairs;Stand;Transfers;Locomotion Level    Examination-Participation Restrictions Driving;Community Activity;Occupation;Cleaning    Stability/Clinical Decision Making Stable/Uncomplicated    Clinical Decision Making Low    Rehab Potential Good    PT Frequency 2x / week    PT Duration 4 weeks    PT Treatment/Interventions ADLs/Self Care Home Management;Aquatic Therapy;Electrical Stimulation;Cryotherapy;Moist Heat;Traction;Therapeutic exercise;Therapeutic  activities;Stair training;Gait training;Neuromuscular re-education;Patient/family education;Manual techniques;Dry needling;Passive range of motion;Joint Manipulations    PT Next Visit Plan f/u with gait mechanics, hip joint mobility, hip flexor stretch, glute strengthening.  PT wishes to continue therapy to address  Rt LE pain following sleeping    PT Home Exercise Plan POE, piriformis stretch, lumbar support in sitting; 9/15: bridge, LTR, piriformis in supine position. 9/20 SLR, hip ext; 9/26 SKTC, hip flexor stretch, prone plank on knees; 10/7 gait mechanics 10/11 seated side bend, lumbar flexion stretch seated, sit to stand    Consulted and Agree with Plan of Care Patient             Patient will benefit from skilled therapeutic intervention in order to improve the following deficits and impairments:  Pain, Decreased strength, Difficulty walking, Decreased range of motion, Decreased mobility, Decreased activity tolerance, Postural dysfunction, Improper body mechanics  Visit Diagnosis: Muscle weakness (generalized)  Difficulty in walking, not elsewhere classified  Low back pain, unspecified back pain laterality, unspecified chronicity, unspecified whether sciatica present     Problem List Patient Active Problem List   Diagnosis Date Noted   Constipation 06/01/2020   Osteoarthritis 04/26/2019   Hypertension associated with diabetes ([place]) 09/29/2018   Diabetes ([place]) 08/14/2018   Hyperlipidemia associated with type 2 diabetes mellitus ([place]) 08/14/2018   GERD without esophagitis 08/14/2018   BPH (benign prostatic hyperplasia) 08/14/2018   Iron deficiency anemia 08/14/2018   Dizzy spells 01/20/2015   Ihor Austin, LPTA/CLT; CBIS [phone]  Aldona Lento, PTA 05/30/2021, 6:59 PM  [place] [address] [place], [place], [address] Phone: [phone]   Fax:  [phone]  Name: Harvey Matlack MRN: [id number] Date of  Birth: 1951-06-23

## 2021-05-31 ENCOUNTER — Encounter (HOSPITAL_COMMUNITY): Payer: 59

## 2021-06-04 ENCOUNTER — Ambulatory Visit (INDEPENDENT_AMBULATORY_CARE_PROVIDER_SITE_OTHER): Payer: 59 | Admitting: Family

## 2021-06-04 ENCOUNTER — Encounter: Payer: Self-pay | Admitting: Family

## 2021-06-04 ENCOUNTER — Other Ambulatory Visit: Payer: Self-pay | Admitting: Family

## 2021-06-04 ENCOUNTER — Telehealth: Payer: Self-pay | Admitting: Family

## 2021-06-04 DIAGNOSIS — K0889 Other specified disorders of teeth and supporting structures: Secondary | ICD-10-CM | POA: Diagnosis not present

## 2021-06-04 MED ORDER — AMOXICILLIN-POT CLAVULANATE 875-125 MG PO TABS: 1.0000 | ORAL_TABLET | Freq: Two times a day (BID) | ORAL | 0 refills | Status: DC

## 2021-06-04 NOTE — Patient Instructions (Signed)
Dental Pain Dental pain is often a sign that something is wrong with your teeth or gums. It is also something that can occur following dental treatment. If you have dental pain, it is important to contact your dental care provider, especially if the cause of the pain has not been determined. Dental pain may be of varying intensity and can be caused by many things, including: Tooth decay (cavities or caries). Cavities are caused by bacteria that produce acids that irritate the nerve of your tooth, making it sensitive to air and hot or cold temperatures. This eventually causes discomfort or pain. Abscess or infection. Once the bacteria reach the inner part of the tooth (pulp), a bacterial infection (dental abscess) can occur. Pus typically collects at the end of the root of a tooth. Injury. A crack in the tooth. Gum recession exposing the root, and possibly the nerves, of a tooth. Gum (periodontal)disease. Abnormal grinding or clenching. Poor or improper home care. An unknown reason (idiopathic). Your pain may be mild or severe. It may occur when you are: Chewing. Exposed to hot or cold temperatures. Eating or drinking sugary foods or beverages, such as soda or candy. Your pain may be constant, or it may come and go without cause. Follow these instructions at home: The following actions may help to lessen any discomfort that you are feeling before or after getting dental care. Medicines Take over-the-counter and prescription medicines only as told by your dental care provider. If you were prescribed an antibiotic medicine, take it as told by your dental care provider. Do not stop taking the antibiotic even if you start to feel better. Eating and drinking Avoid foods or drinks that cause you pain, such as: Very hot or very cold foods or drinks. Sweet or sugary foods or drinks. Managing pain and swelling  Ice can sometimes be used to reduce pain and swelling, especially if the pain is  following dental treatment. If directed, put ice on the painful area of your face. To do this: Put ice in a plastic bag. Place a towel between your skin and the bag. Leave the ice on for 20 minutes, 2-3 times a day. Remove the ice if your skin turns bright red. This is very important. If you cannot feel pain, heat, or cold, you have a greater risk of damage to the area. Brushing your teeth To keep your mouth and gums healthy, brush your teeth twice a day using a fluoride toothpaste. Use a toothpaste made for sensitive teeth as directed by your dental care provider, especially if the root is exposed. Always brush your teeth with a soft-bristled toothbrush. This will help prevent irritation to your gums. General instructions Floss at least once a day. Do not apply heat to the outside of the face. Gargle with a mixture of salt and water 3-4 times a day or as needed. To make salt water, completely dissolve -1 tsp (3-6 g) of salt in 1 cup (237 mL) of warm water. Keep all follow-up visits. This is important. Contact a dental care provider if: You have any unexplained dental pain. Your pain is not controlled with medicines. Your symptoms get worse. You have new symptoms. Get help right away if: You are unable to open your mouth. You are having trouble breathing or swallowing. You have a fever. You notice that your face, neck, or jaw is swollen. These symptoms may represent a serious problem that is an emergency. Do not wait to see if the symptoms will go   away. Get medical help right away. Call your local emergency services (911 in the [place] Do not drive yourself to the hospital. Summary Dental pain may be caused by many things, including tooth decay and infection. Your pain may be mild or severe. Take over-the-counter and prescription medicines only as told by your dental care provider. Watch your dental pain for any changes. Let your dental care provider know if your symptoms get  worse. This information is not intended to replace advice given to you by your health care provider. Make sure you discuss any questions you have with your health care provider. Document Revised: 04/26/2020 Document Reviewed: 04/26/2020 Elsevier Patient Education  2022 Elsevier Inc.  

## 2021-06-04 NOTE — Telephone Encounter (Signed)
Patient needs to be seen.

## 2021-06-04 NOTE — Progress Notes (Signed)
Virtual Visit  Note Due to COVID-19 pandemic this visit was conducted virtually. This visit type was conducted due to national recommendations for restrictions regarding the COVID-19 Pandemic (e.g. social distancing, sheltering in place) in an effort to limit this patient's exposure and mitigate transmission in our community. All issues noted in this document were discussed and addressed.  A physical exam was not performed with this format.  I connected with Nyoka Cowden on 06/04/21 at 9:45 AM  by telephone and verified that I am speaking with the correct person using two identifiers. Baruch Lewers is currently located at home and no one is currently with him  during visit. The provider, Jannifer Rodney, FNP is located in their office at time of visit.  I discussed the limitations, risks, security and privacy concerns of performing an evaluation and management service by telephone and the availability of in person appointments. I also discussed with the patient that there may be a patient responsible charge related to this service. The patient expressed understanding and agreed to proceed.   Mr. devlin, brink are scheduled for a virtual visit with your provider today.    Just as we do with appointments in the office, we must obtain your consent to participate.  Your consent will be active for this visit and any virtual visit you may have with one of our providers in the next 365 days.    If you have a MyChart account, I can also send a copy of this consent to you electronically.  All virtual visits are billed to your insurance company just like a traditional visit in the office.  As this is a virtual visit, video technology does not allow for your provider to perform a traditional examination.  This may limit your provider's ability to fully assess your condition.  If your provider identifies any concerns that need to be evaluated in person or the need to arrange testing such as labs, EKG, etc, we  will make arrangements to do so.    Although advances in technology are sophisticated, we cannot ensure that it will always work on either your end or our end.  If the connection with a video visit is poor, we may have to switch to a telephone visit.  With either a video or telephone visit, we are not always able to ensure that we have a secure connection.   I need to obtain your verbal consent now.   Are you willing to proceed with your visit today?   Masiah Woody has provided verbal consent on 06/04/2021 for a virtual visit (video or telephone).   Jannifer Rodney, [place] 06/04/2021  9:47 AM    History and Present Illness:  HPI Pt calls the office today with left upper gum pain that started two weeks ago and is worsening. He has taken  Tylenol and motrin without relief. His gum is swollen and tender. He reports his pain is 8 out 10. Denies any fever. He does not have dentists.   Review of Systems  All other systems reviewed and are negative.   Observations/Objective: No SOB or distress noted   Assessment and Plan: 1. Pain, dental Rinse mouth after eating Pt to call dentist today and make follow up Start Augmentin Follow up if symptoms worsen or do not improve  - amoxicillin-clavulanate (AUGMENTIN) 875-125 MG tablet; Take 1 tablet by mouth 2 (two) times daily.  Dispense: 14 tablet; Refill: 0    I discussed the assessment and treatment plan with the patient. The  patient was provided an opportunity to ask questions and all were answered. The patient agreed with the plan and demonstrated an understanding of the instructions.   The patient was advised to call back or seek an in-person evaluation if the symptoms worsen or if the condition fails to improve as anticipated.  The above assessment and management plan was discussed with the patient. The patient verbalized understanding of and has agreed to the management plan. Patient is aware to call the clinic if symptoms persist or worsen.  Patient is aware when to return to the clinic for a follow-up visit. Patient educated on when it is appropriate to go to the emergency department.   Time call ended:  9:56 AM   I provided 11 minutes of  non face-to-face time and documenting  during this encounter.    Jannifer Rodney, FNP

## 2021-06-06 ENCOUNTER — Ambulatory Visit (HOSPITAL_COMMUNITY): Payer: 59 | Attending: Neurosurgery

## 2021-06-06 ENCOUNTER — Other Ambulatory Visit: Payer: Self-pay

## 2021-06-06 ENCOUNTER — Encounter (HOSPITAL_COMMUNITY): Payer: Self-pay

## 2021-06-06 DIAGNOSIS — M545 Low back pain, unspecified: Secondary | ICD-10-CM | POA: Diagnosis present

## 2021-06-06 DIAGNOSIS — M6281 Muscle weakness (generalized): Secondary | ICD-10-CM | POA: Insufficient documentation

## 2021-06-06 DIAGNOSIS — R262 Difficulty in walking, not elsewhere classified: Secondary | ICD-10-CM | POA: Insufficient documentation

## 2021-06-06 NOTE — Therapy (Addendum)
[facility] [address] [place], [place], [address] Phone: [phone]   Fax:  [phone]  Physical Therapy Treatment and Discharge Note  Patient Details  Name: Jimmy Craig MRN: [id number] Date of Birth: 05-25-1951 Referring Provider (PT): Clayton Bibles, MD  PHYSICAL THERAPY DISCHARGE SUMMARY  Visits from Start of Care: 12  Current functional level related to goals / functional outcomes: See below   Remaining deficits: See below   Education / Equipment: See below   Patient agrees to discharge. Patient goals were partially met. Patient is being discharged due to being pleased with the current functional level.  10:11 AM, 06/07/21 Jerene Pitch, DPT Physical Therapy with [facility]  [phone] office   Encounter Date: 06/06/2021   PT End of Session - 06/06/21 1351     Visit Number 12    Number of Visits 20    Date for PT Re-Evaluation 06/25/21    Authorization Type [employer], 30 CL combined PT/OT with 0 used, no auth    Authorization - Visit Number 15    Authorization - Number of Visits 30    PT Start Time 3016    PT Stop Time 1348    PT Time Calculation (min) 33 min    Activity Tolerance Patient tolerated treatment well    Behavior During Therapy WFL for tasks assessed/performed             Past Medical History:  Diagnosis Date   Arthritis    BPH (benign prostatic hyperplasia)    Chronic back pain    GERD (gastroesophageal reflux disease)    Hypertension    Mixed hyperlipidemia    Type 2 diabetes mellitus ([place])     Past Surgical History:  Procedure Laterality Date   CATARACT EXTRACTION Right    CATARACT EXTRACTION W/PHACO Left 12/20/2013   Procedure: CATARACT EXTRACTION PHACO AND INTRAOCULAR LENS PLACEMENT ([place]);  Surgeon: Williams Che, MD;  Location: [facility];  Service: Ophthalmology;  Laterality: Left;  CDE: 0.97    There were no vitals filed for this visit.    Subjective Assessment - 06/06/21 1316     Subjective Pt stated he has began the stretches after sleeping with no reoprts of change.  Continues to have Lt LE pain wiht acttivity.    Pertinent History DB    Patient Stated Goals to be able to walk with less pain    Currently in Pain? No/denies                [facility] PT Assessment - 06/06/21 0001       Assessment   Medical Diagnosis Left arm weakness    Referring Provider (PT) Clayton Bibles, MD    Onset Date/Surgical Date --   around 6 months   Next MD Visit 06/22/21      Precautions   Precautions None      Observation/Other Assessments   Focus on Therapeutic Outcomes (FOTO)  55.88% functional   46.25% functional     AROM   Lumbar Flexion WNL    Lumbar Extension WNL    Lumbar - Right Side Bend WNL no pain    Lumbar - Left Side Bend WNL no pain    Lumbar - Right Rotation WNL    Lumbar - Left Rotation WNL stretch on Lt LE      Strength   Right Hip Extension 4+/5    Right Hip ABduction 5/5    Left Hip Extension 4+/5  Left Hip ABduction 5/5    Right Knee Flexion 5/5    Right Knee Extension 5/5    Left Knee Flexion 5/5    Left Knee Extension 5/5                           [facility] Adult PT Treatment/Exercise - 06/06/21 0001       Lumbar Exercises: Standing   Functional Squats 10 reps    Lifting From floor;10 reps    Lifting Weights (lbs) 8, 18    Lifting Limitations Cueing for mechanics                       PT Short Term Goals - 06/06/21 1324       PT SHORT TERM GOAL #1   Title Patient will be able to demonstrate pain free lumbar motion    Baseline 10/17:  See ROM, c/o Lt thigh/knee pain wiht sidebend      PT SHORT TERM GOAL #2   Title Patient will report at least 50% improvement in overall symptoms and/or function to demonstrate improved functional mobility    Baseline 06/06/21: Reports importance 30% overall    Status On-going      PT SHORT TERM GOAL #3   Title Patient will  be independent in self management strategies to improve quality of life and functional outcomes.    Baseline 05/21/21:  Reports compliance with HEP daily    Status Achieved               PT Long Term Goals - 06/06/21 1326       PT LONG TERM GOAL #1   Title Patient will report at least 75% improvement in symptoms for improved quality of life.    Baseline 06/06/21: Reports improvements by 30%  05/21/21:  Reports improvements by 20%      PT LONG TERM GOAL #3   Title Patient will be able to ambulate for at least 30 minutes with pain no greater than 1/10 in order to demonstrate improved ability to ambulate in the community.    Baseline 06/06/21:  Pain Lt LE following 20 minutes of activity/gait; 05/21/21:  Ability to ambulate for 15 minutes prior arrival of pain to 4/10 Lt thigh/groin pain                   Plan - 06/06/21 1351     Clinical Impression Statement Pt reports he has began additional stretches that he finds helpful but no change in radicular symptoms with Lt LE upon activity and Rt LE foot pain upon initially waking up.  Reviewed goals following reports of no change.  Improved ROM all directions wtih no pain, improved self perceived functional abilities noted with FOTO, strength all 4+ or greater.  Discussed continuing PT vs DC to self care.  Pt has MD 11/18.  Decision made to DC to HEP.    Personal Factors and Comorbidities Comorbidity 1;Comorbidity 2    Comorbidities hx lumbar surgery april 2022, DB    Examination-Activity Limitations Bed Mobility;Stairs;Stand;Transfers;Locomotion Level    Examination-Participation Restrictions Driving;Community Activity;Occupation;Cleaning    Stability/Clinical Decision Making Stable/Uncomplicated    Clinical Decision Making Low    Rehab Potential Good    PT Frequency 2x / week    PT Duration 4 weeks    PT Treatment/Interventions ADLs/Self Care Home Management;Aquatic Therapy;Electrical Stimulation;Cryotherapy;Moist  Heat;Traction;Therapeutic exercise;Therapeutic activities;Stair training;Gait training;Neuromuscular re-education;Patient/family education;Manual techniques;Dry needling;Passive range of  motion;Joint Manipulations    PT Next Visit Plan DC to HEP.    PT Home Exercise Plan POE, piriformis stretch, lumbar support in sitting; 9/15: bridge, LTR, piriformis in supine position. 9/20 SLR, hip ext; 9/26 SKTC, hip flexor stretch, prone plank on knees; 10/7 gait mechanics 10/11 seated side bend, lumbar flexion stretch seated, sit to stand; 11/2:  Reviewed proper lifting mechanics    Consulted and Agree with Plan of Care Patient             Patient will benefit from skilled therapeutic intervention in order to improve the following deficits and impairments:  Pain, Decreased strength, Difficulty walking, Decreased range of motion, Decreased mobility, Decreased activity tolerance, Postural dysfunction, Improper body mechanics  Visit Diagnosis: Muscle weakness (generalized)  Difficulty in walking, not elsewhere classified  Low back pain, unspecified back pain laterality, unspecified chronicity, unspecified whether sciatica present     Problem List Patient Active Problem List   Diagnosis Date Noted   Constipation 06/01/2020   Osteoarthritis 04/26/2019   Hypertension associated with diabetes ([place]) 09/29/2018   Diabetes ([place]) 08/14/2018   Hyperlipidemia associated with type 2 diabetes mellitus ([place]) 08/14/2018   GERD without esophagitis 08/14/2018   BPH (benign prostatic hyperplasia) 08/14/2018   Iron deficiency anemia 08/14/2018   Dizzy spells 01/20/2015   Ihor Austin, LPTA/CLT; CBIS [phone]  Aldona Lento, PTA 06/06/2021, 2:02 PM  [facility] [address] [place], [place], [address] Phone: [phone]   Fax:  [phone]  Name: Jimmy Craig MRN: [id number] Date of Birth: September 10, 1950

## 2021-06-13 ENCOUNTER — Encounter (HOSPITAL_COMMUNITY): Payer: 59

## 2021-06-14 ENCOUNTER — Other Ambulatory Visit: Payer: Self-pay | Admitting: Family

## 2021-06-14 DIAGNOSIS — E1142 Type 2 diabetes mellitus with diabetic polyneuropathy: Secondary | ICD-10-CM

## 2021-06-20 ENCOUNTER — Encounter (HOSPITAL_COMMUNITY): Payer: 59

## 2021-06-28 ENCOUNTER — Other Ambulatory Visit: Payer: Self-pay | Admitting: Family

## 2021-07-03 ENCOUNTER — Other Ambulatory Visit: Payer: Self-pay

## 2021-07-03 ENCOUNTER — Encounter: Payer: Self-pay | Admitting: Orthopaedic Surgery

## 2021-07-03 ENCOUNTER — Ambulatory Visit (INDEPENDENT_AMBULATORY_CARE_PROVIDER_SITE_OTHER): Payer: 59 | Admitting: Orthopaedic Surgery

## 2021-07-03 ENCOUNTER — Ambulatory Visit: Payer: 59

## 2021-07-03 VITALS — BP 117/68 | HR 84 | Ht 65.0 in | Wt 142.0 lb

## 2021-07-03 DIAGNOSIS — M7062 Trochanteric bursitis, left hip: Secondary | ICD-10-CM | POA: Diagnosis not present

## 2021-07-03 DIAGNOSIS — M25552 Pain in left hip: Secondary | ICD-10-CM

## 2021-07-03 DIAGNOSIS — G8929 Other chronic pain: Secondary | ICD-10-CM | POA: Diagnosis not present

## 2021-07-03 NOTE — Progress Notes (Signed)
My hip hurts.  He has pain of the lateral left hip.  He saw Dr. Terrilee Files, neurosurgery, and he noted it.   I have read the notes.  The pain is worse when he first stands.  He has no trauma, no swelling.  He has pain that runs part way down the lateral left thigh.  Exam shows tenderness of the left lateral trochanteric area.  There is no redness. ROM of hip is full.  NV intact.  X-rays were done of the left hip, reported separately.  Encounter Diagnoses  Name Primary?   Chronic left hip pain Yes   Trochanteric bursitis, left hip    PROCEDURE NOTE:  The patient request injection, verbal consent was obtained.  The left trochanteric area of the hip was prepped appropriately after time out was performed.   Sterile technique was observed and injection of 1 cc of DepoMedrol 40mg  with several cc's of plain xylocaine. Anesthesia was provided by ethyl chloride and a 20-gauge needle was used to inject the hip area. The injection was tolerated well.  A band aid dressing was applied.  The patient was advised to apply ice later today and tomorrow to the injection sight as needed.  I will see him in three weeks.  Call if any problem.  Precautions discussed.  Electronically Signed , MD 11/29/20222:25 PM

## 2021-07-09 ENCOUNTER — Other Ambulatory Visit: Payer: Self-pay | Admitting: Family

## 2021-07-21 ENCOUNTER — Other Ambulatory Visit: Payer: Self-pay | Admitting: Family

## 2021-07-21 DIAGNOSIS — M159 Polyosteoarthritis, unspecified: Secondary | ICD-10-CM

## 2021-07-21 DIAGNOSIS — M25559 Pain in unspecified hip: Secondary | ICD-10-CM

## 2021-07-23 ENCOUNTER — Other Ambulatory Visit: Payer: Self-pay | Admitting: Family

## 2021-07-23 DIAGNOSIS — E1169 Type 2 diabetes mellitus with other specified complication: Secondary | ICD-10-CM

## 2021-07-24 ENCOUNTER — Ambulatory Visit (INDEPENDENT_AMBULATORY_CARE_PROVIDER_SITE_OTHER): Payer: 59 | Admitting: Family

## 2021-07-24 ENCOUNTER — Encounter: Payer: Self-pay | Admitting: Family

## 2021-07-24 VITALS — BP 131/75 | HR 77 | Temp 97.5°F | Ht 65.0 in | Wt 141.6 lb

## 2021-07-24 DIAGNOSIS — G8929 Other chronic pain: Secondary | ICD-10-CM

## 2021-07-24 DIAGNOSIS — E1142 Type 2 diabetes mellitus with diabetic polyneuropathy: Secondary | ICD-10-CM | POA: Diagnosis not present

## 2021-07-24 DIAGNOSIS — M48061 Spinal stenosis, lumbar region without neurogenic claudication: Secondary | ICD-10-CM

## 2021-07-24 DIAGNOSIS — N401 Enlarged prostate with lower urinary tract symptoms: Secondary | ICD-10-CM

## 2021-07-24 DIAGNOSIS — K5900 Constipation, unspecified: Secondary | ICD-10-CM

## 2021-07-24 DIAGNOSIS — M159 Polyosteoarthritis, unspecified: Secondary | ICD-10-CM

## 2021-07-24 DIAGNOSIS — I152 Hypertension secondary to endocrine disorders: Secondary | ICD-10-CM

## 2021-07-24 DIAGNOSIS — K219 Gastro-esophageal reflux disease without esophagitis: Secondary | ICD-10-CM

## 2021-07-24 DIAGNOSIS — R351 Nocturia: Secondary | ICD-10-CM

## 2021-07-24 DIAGNOSIS — Z794 Long term (current) use of insulin: Secondary | ICD-10-CM

## 2021-07-24 DIAGNOSIS — M5442 Lumbago with sciatica, left side: Secondary | ICD-10-CM

## 2021-07-24 DIAGNOSIS — E1159 Type 2 diabetes mellitus with other circulatory complications: Secondary | ICD-10-CM | POA: Diagnosis not present

## 2021-07-24 DIAGNOSIS — E1169 Type 2 diabetes mellitus with other specified complication: Secondary | ICD-10-CM

## 2021-07-24 DIAGNOSIS — E785 Hyperlipidemia, unspecified: Secondary | ICD-10-CM

## 2021-07-24 LAB — BAYER DCA HB A1C WAIVED: HB A1C (BAYER DCA - WAIVED): 6.8 % — ABNORMAL HIGH (ref 4.8–5.6)

## 2021-07-24 MED ORDER — OLOPATADINE HCL 0.1 % OP SOLN: 1.0000 [drp] | Freq: Two times a day (BID) | OPHTHALMIC | 12 refills | Status: AC

## 2021-07-24 MED ORDER — BACLOFEN 10 MG PO TABS: 10.0000 mg | ORAL_TABLET | Freq: Three times a day (TID) | ORAL | 1 refills | Status: DC

## 2021-07-24 NOTE — Progress Notes (Signed)
Subjective:    Patient ID: Jimmy Craig, male    DOB: 12-Nov-1950, 70 y.o.   MRN: [id number]  Chief Complaint  Patient presents with   Medical Management of Chronic Issues   Pt presents to the office today for chronic follow up. He has chronic back pain and is followed by Neurosurgeon. He had lumbar surgery on  11/22/20.  He had a MRI in 01/2020. He reports his leg pain is improved, but having pain of 7 out 10.   He is followed by Ortho and had a steroid injection in left hip that did not help.  Diabetes He presents for his follow-up diabetic visit. He has type 2 diabetes mellitus. Associated symptoms include blurred vision and foot paresthesias. Symptoms are stable. Diabetic complications include heart disease. Risk factors for coronary artery disease include diabetes mellitus, hypertension and sedentary lifestyle. He is following a generally healthy diet. His overall blood glucose range is 110-130 mg/dl. Eye exam is not current.  Hypertension This is a chronic problem. The current episode started more than 1 year ago. The problem has been resolved since onset. The problem is controlled. Associated symptoms include blurred vision and malaise/fatigue. Pertinent negatives include no peripheral edema or shortness of breath. Risk factors for coronary artery disease include dyslipidemia, diabetes mellitus, obesity and sedentary lifestyle. The current treatment provides moderate improvement.  Gastroesophageal Reflux He complains of belching and heartburn. This is a chronic problem. The current episode started more than 1 year ago. The problem occurs occasionally. He has tried a PPI for the symptoms. The treatment provided moderate relief.  Hyperlipidemia This is a chronic problem. The current episode started more than 1 year ago. The problem is controlled. Associated symptoms include leg pain. Pertinent negatives include no shortness of breath. Current antihyperlipidemic treatment includes statins.  The current treatment provides moderate improvement of lipids. Risk factors for coronary artery disease include dyslipidemia, diabetes mellitus, male sex, hypertension and a sedentary lifestyle.  Back Pain This is a chronic problem. The current episode started more than 1 year ago. The problem occurs intermittently. The pain is present in the lumbar spine. The quality of the pain is described as aching. The pain is mild. Associated symptoms include leg pain. He has tried bed rest and muscle relaxant for the symptoms. The treatment provided mild relief.  Arthritis Presents for follow-up visit. He complains of pain and stiffness. Affected locations include the left hip and right hip. His pain is at a severity of 7/10.  Benign Prostatic Hypertrophy This is a chronic problem. The current episode started more than 1 month ago. The problem has been waxing and waning since onset. Irritative symptoms include nocturia (1-2). Past treatments include tamsulosin. The treatment provided moderate relief.  Constipation This is a chronic problem. The current episode started more than 1 year ago. The problem has been waxing and waning since onset. His stool frequency is 4 to 5 times per week. Associated symptoms include back pain. He has tried laxatives for the symptoms. The treatment provided moderate relief.     Review of Systems  Constitutional:  Positive for malaise/fatigue.  Eyes:  Positive for blurred vision.  Respiratory:  Negative for shortness of breath.   Gastrointestinal:  Positive for constipation and heartburn.  Genitourinary:  Positive for nocturia (1-2).  Musculoskeletal:  Positive for arthritis, back pain and stiffness.  All other systems reviewed and are negative.     Objective:   Physical Exam Vitals reviewed.  Constitutional:  General: He is not in acute distress.    Appearance: He is well-developed.  HENT:     Head: Normocephalic.     Right Ear: Tympanic membrane normal.      Left Ear: Tympanic membrane normal.  Eyes:     General:        Right eye: No discharge.        Left eye: No discharge.     Pupils: Pupils are equal, round, and reactive to light.  Neck:     Thyroid: No thyromegaly.  Cardiovascular:     Rate and Rhythm: Normal rate and regular rhythm.     Heart sounds: Normal heart sounds. No murmur heard. Pulmonary:     Effort: Pulmonary effort is normal. No respiratory distress.     Breath sounds: Normal breath sounds. No wheezing.  Abdominal:     General: Bowel sounds are normal. There is no distension.     Palpations: Abdomen is soft.     Tenderness: There is no abdominal tenderness.  Musculoskeletal:        General: No tenderness. Normal range of motion.     Cervical back: Normal range of motion and neck supple.  Skin:    General: Skin is warm and dry.     Findings: No erythema or rash.  Neurological:     Mental Status: He is alert and oriented to person, place, and time.     Cranial Nerves: No cranial nerve deficit.     Deep Tendon Reflexes: Reflexes are normal and symmetric.  Psychiatric:        Behavior: Behavior normal.        Thought Content: Thought content normal.        Judgment: Judgment normal.      BP 131/75    Pulse 77    Temp (!) 97.5 F (36.4 C) (Temporal)    Ht _0  (1.651 m)    Wt 141 lb 9.6 oz (64.2 kg)    BMI 23.56 kg/m      Assessment & Plan:  Jimmy Craig comes in today with chief complaint of Medical Management of Chronic Issues   Diagnosis and orders addressed:  1. Hypertension associated with diabetes ([place]) - CMP14+EGFR - CBC with Differential/Platelet  2. GERD without esophagitis - CMP14+EGFR - CBC with Differential/Platelet  3. Type 2 diabetes mellitus with diabetic polyneuropathy, with long-term current use of insulin ([facility]) - Bayer DCA Hb A1c Waived - CMP14+EGFR - CBC with Differential/Platelet  4. Hyperlipidemia associated with type 2 diabetes mellitus ([facility]) - CMP14+EGFR - CBC with  Differential/Platelet  5. Chronic low back pain with left-sided sciatica, unspecified back pain laterality  - CMP14+EGFR - CBC with Differential/Platelet - Tens unit  6. Primary osteoarthritis involving multiple joints - CMP14+EGFR - CBC with Differential/Platelet - Tens unit  7. Benign prostatic hyperplasia with nocturia - CMP14+EGFR - CBC with Differential/Platelet  8. Constipation, unspecified constipation type - CMP14+EGFR - CBC with Differential/Platelet  9. Spinal stenosis of lumbar region, unspecified whether neurogenic claudication present - CMP14+EGFR - CBC with Differential/Platelet - Tens unit   Labs pending Health Maintenance reviewed Diet and exercise encouraged  Follow up plan: 3 months   Evelina Dun, FNP

## 2021-07-24 NOTE — Patient Instructions (Signed)
Muscle Pain, Adult ?Muscle pain, also called myalgia, is a condition in which a person has pain in one or more muscles in the body. Muscle pain may be mild, moderate, or severe. It may feel sharp, achy, or burning. In most cases, the pain lasts only a short time and goes away without treatment. ?Muscle pain can result from using muscles in a new or different way or after a period of inactivity. It is normal to feel some muscle pain after starting an exercise program. Muscles that have not been used often will be sore at first. ?What are the causes? ?This condition is caused by using muscles in a new or different way after a period of inactivity. Other causes may include: ?Overuse or muscle strain, especially if you are not in shape. This is the most common cause of muscle pain. ?Injury or bruising. ?Infectious diseases, including diseases caused by viruses, such as the flu (influenza). ?Fibromyalgia.This is a long-term, or chronic, condition that causes muscle tenderness, tiredness (fatigue), and headache. ?Autoimmune or rheumatologic diseases. These are conditions, such as lupus, in which the body's defense system (immunesystem) attacks areas in the body. ?Certain medicines, including ACE inhibitors and statins. ?What are the signs or symptoms? ?The main symptom of this condition is sore or painful muscles, including during activity and when stretching. You may also have slight swelling. ?How is this diagnosed? ?This condition is diagnosed with a physical exam. Your health care provider will ask questions about your pain and when it began. If you have not had muscle pain for very long, your health care provider may want to wait before doing much testing. If your muscle pain has lasted a long time, tests may be done right away. In some cases, this may include tests to rule out certain conditions or illnesses. ?How is this treated? ?Treatment for this condition depends on the cause. Home care is often enough to  relieve muscle pain. Your health care provider may also prescribe NSAIDs, such as ibuprofen. ?Follow these instructions at home: ?Medicines ?Take over-the-counter and prescription medicines only as told by your health care provider. ?Ask your health care provider if the medicine prescribed to you requires you to avoid driving or using machinery. ?Managing pain, swelling, and discomfort ?  ?If directed, put ice on the painful area. To do this: ?Put ice in a plastic bag. ?Place a towel between your skin and the bag. ?Leave the ice on for 20 minutes, 2-3 times a day. ?For the first 2 days of muscle soreness, or if there is swelling: ?Do not soak in hot baths. ?Do not use a hot tub, steam room, sauna, heating pad, or other heat source. ?After 48-72 hours, you may alternate between applying ice and applying heat as told by your health care provider. If directed, apply heat to the affected area as often as told by your health care provider. Use the heat source that your health care provider recommends, such as a moist heat pack or a heating pad. ?Place a towel between your skin and the heat source. ?Leave the heat on for 20-30 minutes. ?Remove the heat if your skin turns bright red. This is especially important if you are unable to feel pain, heat, or cold. You may have a greater risk of getting burned. ?If you have an injury, raise (elevate) the injured area above the level of your heart while you are sitting or lying down. ?Activity ? ?If overuse is causing your muscle pain: ?Slow down your activities   until the pain goes away. ?Do regular, gentle exercises if you are not usually active. ?Warm up before exercising. Stretch before and after exercising. This can help lower the risk of muscle pain. ?Do not continue working out if the pain is severe. Severe pain could mean that you have injured a muscle. ?Do not lift anything that is heavier than 5-10 lb (2.3-4.5 kg), or the limit that you are told, until your health care  provider says that it is safe. ?Return to your normal activities as told by your health care provider. Ask your health care provider what activities are safe for you. ?General instructions ?Do not use any products that contain nicotine or tobacco, such as cigarettes, e-cigarettes, and chewing tobacco. These can delay healing. If you need help quitting, ask your health care provider. ?Keep all follow-up visits as told by your health care provider. This is important. ?Contact a health care provider if you have: ?Muscle pain that gets worse and medicines do not help. ?Muscle pain that lasts longer than 3 days. ?A rash or fever along with muscle pain. ?Muscle pain after a tick bite. ?Muscle pain while working out, even though you are in good physical condition. ?Redness, soreness, or swelling along with muscle pain. ?Muscle pain after starting a new medicine or changing the dose of a medicine. ?Get help right away if you have: ?Trouble breathing. ?Trouble swallowing. ?Muscle pain along with a stiff neck, fever, and vomiting. ?Severe muscle weakness or you cannot move part of your body. ?These symptoms may represent a serious problem that is an emergency. Do not wait to see if the symptoms will go away. Get medical help right away. Call your local emergency services (911 in the [place] Do not drive yourself to the hospital. ?Summary ?Muscle pain usually lasts only a short time and goes away without treatment. ?This condition is caused by using muscles in a new or different way after a period of inactivity. ?If your muscle pain lasts longer than 3 days, tell your health care provider. ?This information is not intended to replace advice given to you by your health care provider. Make sure you discuss any questions you have with your health care provider. ?Document Revised: 02/09/2021 Document Reviewed: 02/09/2021 ?Elsevier Patient Education ? 2022 [employer] ? ?

## 2021-07-24 NOTE — Addendum Note (Signed)
Addended by: Jannifer Rodney A on: 07/24/2021 03:08 PM   Modules accepted: Orders

## 2021-07-25 ENCOUNTER — Ambulatory Visit (INDEPENDENT_AMBULATORY_CARE_PROVIDER_SITE_OTHER): Payer: 59 | Admitting: Orthopaedic Surgery

## 2021-07-25 ENCOUNTER — Encounter: Payer: Self-pay | Admitting: Orthopaedic Surgery

## 2021-07-25 ENCOUNTER — Telehealth: Payer: Self-pay | Admitting: Family Medicine

## 2021-07-25 ENCOUNTER — Other Ambulatory Visit: Payer: Self-pay | Admitting: Family Medicine

## 2021-07-25 VITALS — Ht 65.0 in | Wt 144.2 lb

## 2021-07-25 DIAGNOSIS — M25552 Pain in left hip: Secondary | ICD-10-CM

## 2021-07-25 DIAGNOSIS — M7062 Trochanteric bursitis, left hip: Secondary | ICD-10-CM

## 2021-07-25 LAB — CMP14+EGFR
ALT: 23 IU/L (ref 0–44)
AST: 22 IU/L (ref 0–40)
Albumin/Globulin Ratio: 2.1 (ref 1.2–2.2)
Albumin: 4.6 g/dL (ref 3.8–4.8)
Alkaline Phosphatase: 89 IU/L (ref 44–121)
BUN/Creatinine Ratio: 31 — ABNORMAL HIGH (ref 10–24)
BUN: 26 mg/dL (ref 8–27)
Bilirubin Total: 0.2 mg/dL (ref 0.0–1.2)
CO2: 24 mmol/L (ref 20–29)
Calcium: 9.6 mg/dL (ref 8.6–10.2)
Chloride: 103 mmol/L (ref 96–106)
Creatinine, Ser: 0.83 mg/dL (ref 0.76–1.27)
Globulin, Total: 2.2 g/dL (ref 1.5–4.5)
Glucose: 132 mg/dL — ABNORMAL HIGH (ref 70–99)
Potassium: 4.4 mmol/L (ref 3.5–5.2)
Sodium: 142 mmol/L (ref 134–144)
Total Protein: 6.8 g/dL (ref 6.0–8.5)
eGFR: 95 mL/min/{1.73_m2} (ref >59)

## 2021-07-25 LAB — CBC WITH DIFFERENTIAL/PLATELET
Basophils Absolute: 0.1 10*3/uL (ref 0.0–0.2)
Basos: 1 %
EOS (ABSOLUTE): 0.2 10*3/uL (ref 0.0–0.4)
Eos: 3 %
Hematocrit: 41.8 % (ref 37.5–51.0)
Hemoglobin: 13.3 g/dL (ref 13.0–17.7)
Immature Grans (Abs): 0 10*3/uL (ref 0.0–0.1)
Immature Granulocytes: 1 %
Lymphocytes Absolute: 2.2 10*3/uL (ref 0.7–3.1)
Lymphs: 34 %
MCH: 27.3 pg (ref 26.6–33.0)
MCHC: 31.8 g/dL (ref 31.5–35.7)
MCV: 86 fL (ref 79–97)
Monocytes Absolute: 0.6 10*3/uL (ref 0.1–0.9)
Monocytes: 9 %
Neutrophils Absolute: 3.5 10*3/uL (ref 1.4–7.0)
Neutrophils: 52 %
Platelets: 283 10*3/uL (ref 150–450)
RBC: 4.88 x10E6/uL (ref 4.14–5.80)
RDW: 14.8 % (ref 11.6–15.4)
WBC: 6.5 10*3/uL (ref 3.4–10.8)

## 2021-07-25 NOTE — Progress Notes (Signed)
My hip is hurting more  The injection in the left hip trochanteric area last time helped about a week but the pain has returned.  He has pain laterally.  He has pain getting up from a chair.  He is not improving.  Exam shows pain lateral left hip over trochanteric area. ROM of the hip is decreased more internally.  He has a slight limp to the left.  NV intact. He has no distal edema.  Encounter Diagnoses  Name Primary?   Chronic left hip pain Yes   Trochanteric bursitis, left hip    PROCEDURE NOTE:  The patient request injection, verbal consent was obtained.  The left trochanteric area of the hip was prepped appropriately after time out was performed.   Sterile technique was observed and injection of 1 cc of DepoMedrol 40mg  with several cc's of plain xylocaine. Anesthesia was provided by ethyl chloride and a 20-gauge needle was used to inject the hip area. The injection was tolerated well.  A band aid dressing was applied.  The patient was advised to apply ice later today and tomorrow to the injection sight as needed.  I will get MRI of the hip as he is not improving and has limp and decreased motion.  Call if any problem.  Precautions discussed.  He is on Celebrex and is diabetic.  Electronically Signed , MD 12/21/20229:52 AM

## 2021-07-25 NOTE — Telephone Encounter (Signed)
Your information has been sent to [facility]. 

## 2021-07-26 ENCOUNTER — Other Ambulatory Visit: Payer: Self-pay | Admitting: Family

## 2021-07-26 MED ORDER — AZELASTINE HCL 0.05 % OP SOLN: 1.0000 [drp] | Freq: Two times a day (BID) | OPHTHALMIC | 12 refills | Status: DC

## 2021-07-26 NOTE — Progress Notes (Signed)
Prescription sent to pharmacy to azelastine 0.05%.    Jannifer Rodney, FNP

## 2021-07-26 NOTE — Telephone Encounter (Signed)
Olopatadine hcl 0.1% Non Preferred by insurance  Preferred are azelastine 0.05%, bepotastine besilate 1.5%, epinastine 0.05%, lastacaft 0.25% or zerviate 0.24%

## 2021-07-27 ENCOUNTER — Other Ambulatory Visit: Payer: Self-pay | Admitting: Family

## 2021-07-27 DIAGNOSIS — Z794 Long term (current) use of insulin: Secondary | ICD-10-CM

## 2021-08-05 HISTORY — PX: BACK SURGERY: SHX140

## 2021-08-08 ENCOUNTER — Telehealth: Payer: Self-pay | Admitting: Family

## 2021-08-09 MED ORDER — SEMAGLUTIDE (1 MG/DOSE) 4 MG/3ML ~~LOC~~ SOPN: 1.0000 mg | PEN_INJECTOR | SUBCUTANEOUS | 1 refills | Status: DC

## 2021-08-09 NOTE — Telephone Encounter (Signed)
Will change Trulicity to [place] to see if insurance covers this.

## 2021-08-09 NOTE — Telephone Encounter (Signed)
NA

## 2021-08-16 NOTE — Telephone Encounter (Signed)
Patient said he checked again and he was able to get Trulicity for $25 for one month so he would like to change his record back to that instead of [place].

## 2021-08-20 ENCOUNTER — Other Ambulatory Visit: Payer: Self-pay

## 2021-08-20 DIAGNOSIS — M7062 Trochanteric bursitis, left hip: Secondary | ICD-10-CM

## 2021-08-20 NOTE — Telephone Encounter (Signed)
Can we clarify? PT is currently taking Ozempic 1 mg weekly AND Tresiba 42 units nightly. Which medication is too expensive?

## 2021-08-20 NOTE — Telephone Encounter (Signed)
Wants to change back trulicity from ozempic

## 2021-08-21 MED ORDER — TRULICITY 1.5 MG/0.5ML ~~LOC~~ SOAJ: 1.5000 mg | SUBCUTANEOUS | 2 refills | Status: DC

## 2021-08-21 NOTE — Telephone Encounter (Signed)
Patient aware and verbalized understanding. °

## 2021-08-21 NOTE — Addendum Note (Signed)
Addended by: Jannifer Rodney A on: 08/21/2021 04:02 PM   Modules accepted: Orders

## 2021-08-21 NOTE — Telephone Encounter (Signed)
Stop Ozempic and start Trulicity. Prescription sent to pharmacy

## 2021-08-22 ENCOUNTER — Ambulatory Visit: Payer: Self-pay | Admitting: Orthopaedic Surgery

## 2021-08-22 ENCOUNTER — Encounter: Payer: Self-pay | Admitting: Family Medicine

## 2021-08-23 ENCOUNTER — Telehealth: Payer: Self-pay | Admitting: Family

## 2021-08-23 MED ORDER — TRULICITY 3 MG/0.5ML ~~LOC~~ SOAJ: 3.0000 mg | SUBCUTANEOUS | 2 refills | Status: DC

## 2021-08-23 NOTE — Telephone Encounter (Signed)
Prescription sent to pharmacy.

## 2021-08-23 NOTE — Telephone Encounter (Signed)
Pt said that he was supposed to get 3 mg of Dulaglutide (TRULICITY) 1.5 MG/0.5ML SOPN and they only called in 1.5 mg. He would like to have the 3 mg called in. Please call back if there are any issues.

## 2021-09-05 ENCOUNTER — Other Ambulatory Visit: Payer: Self-pay

## 2021-09-05 ENCOUNTER — Ambulatory Visit (HOSPITAL_COMMUNITY)
Admission: RE | Admit: 2021-09-05 | Discharge: 2021-09-05 | Disposition: A | Discharge status: Home / Self Care | Payer: 59 | Source: Ambulatory Visit | Attending: Orthopaedic Surgery | Admitting: Orthopaedic Surgery

## 2021-09-05 DIAGNOSIS — M7062 Trochanteric bursitis, left hip: Secondary | ICD-10-CM | POA: Diagnosis present

## 2021-09-05 IMAGING — MR MR HIP*L* W/O CM
5 series · 40 of 40 positions shown · non-contrast
Comparison: X-ray hip [DATE].

CLINICAL DATA: Chronic left lateral hip pain, trochanteric area.
Decreased internal range of motion.

EXAM:
MR OF THE LEFT HIP WITHOUT CONTRAST
TECHNIQUE: Multiplanar, multisequence MR imaging was performed. No intravenous
contrast was administered.

[Series 8: T1 · coronal · left · 4.0mm · 1.19mm/px · 8 of 30 slices shown]
[im 1/30]
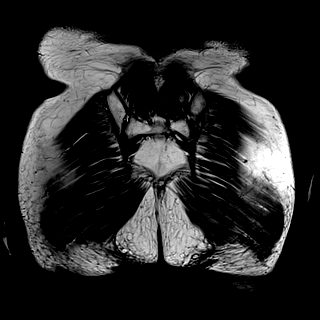
[im 5/30]
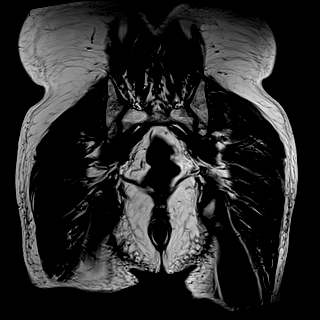
[im 9/30]
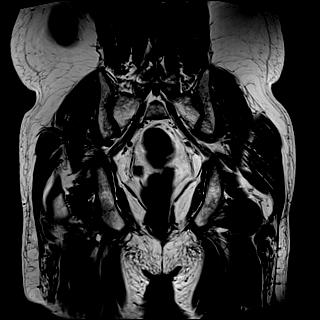
[im 13/30]
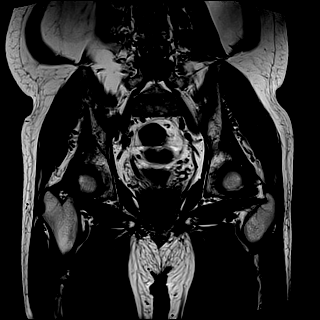
[im 17/30]
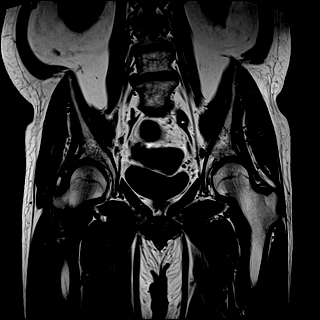
[im 21/30]
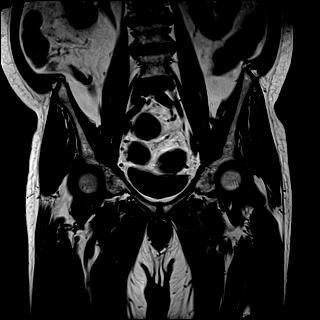
[im 25/30]
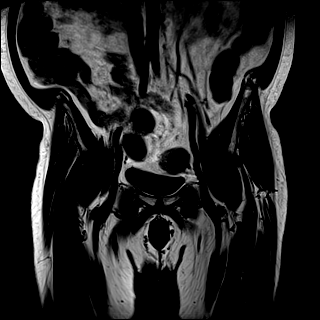
[im 30/30]
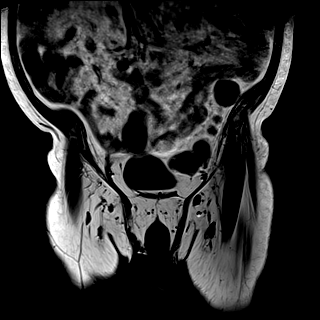

[Series 9: STIR · coronal · left · 4.0mm · 1.19mm/px · 9 of 30 slices shown]
[im 1/30]
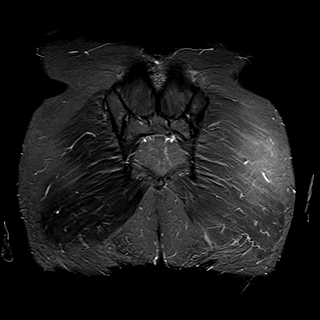
[im 4/30]
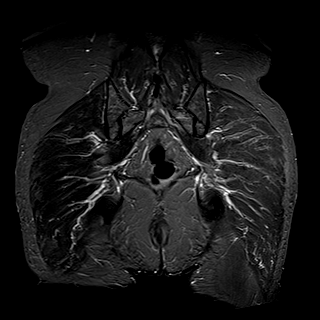
[im 8/30]
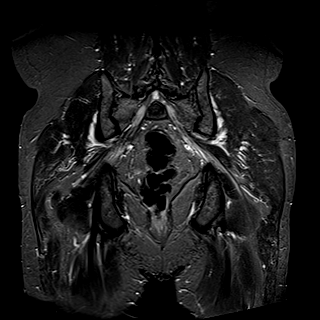
[im 11/30]
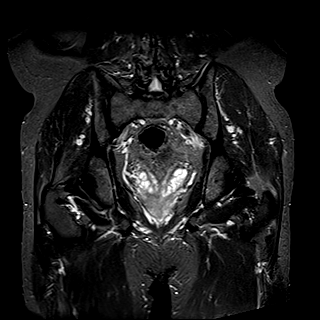
[im 15/30]
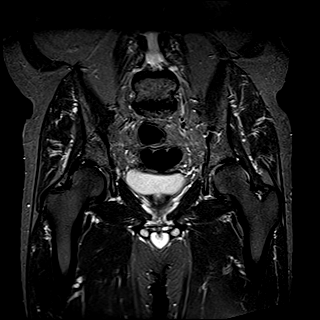
[im 19/30]
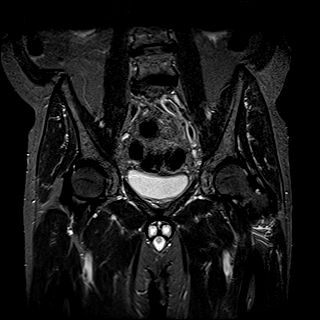
[im 22/30]
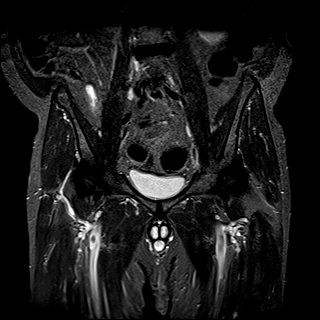
[im 26/30]
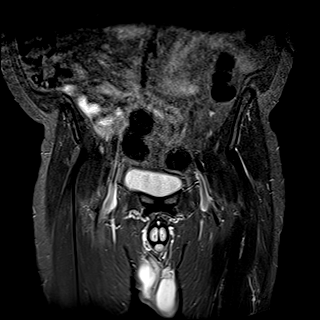
[im 30/30]
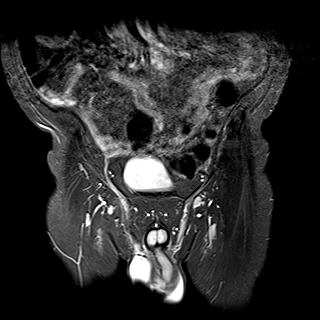

[Series 10: T2 fat-sat · axial · left · 4.0mm · 1.61mm/px · z∈[-54,+116]mm · 10 of 35 slices shown]
[im 1/35]
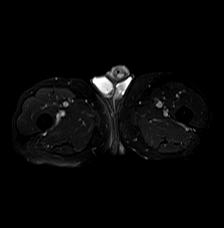
[im 4/35]
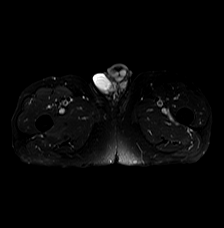
[im 8/35]
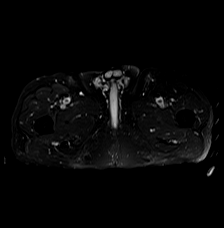
[im 12/35]
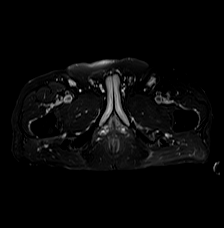
[im 16/35]
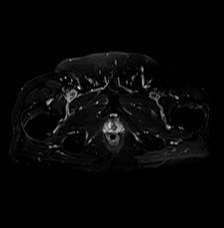
[im 19/35]
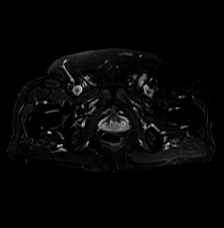
[im 23/35]
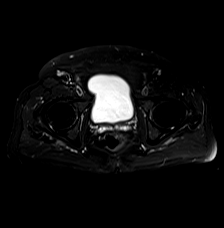
[im 27/35]
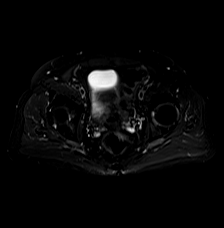
[im 31/35]
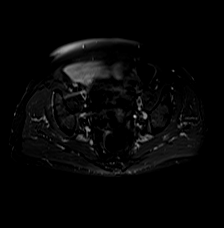
[im 35/35]
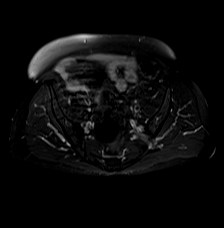

[Series 11: PD fat-sat · sagittal · left · 4.0mm · 0.70mm/px · 7 of 25 slices shown (1 of 2)]
[im 1/25]
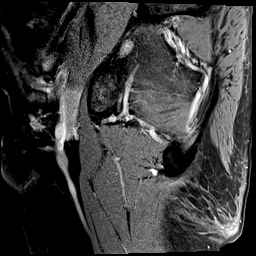
[im 5/25]
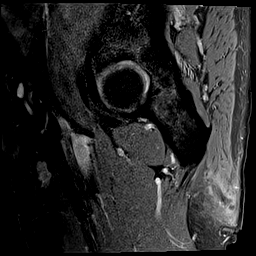
[im 9/25]
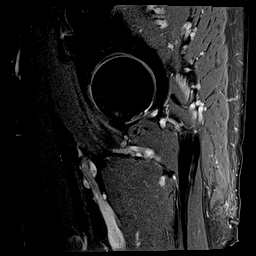
[im 13/25]
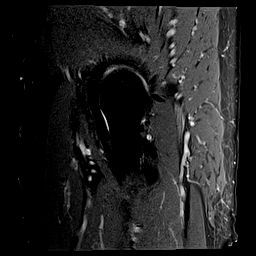
[im 17/25]
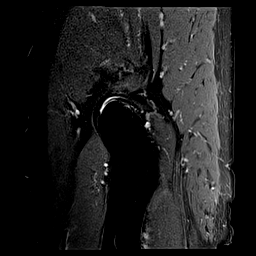
[im 21/25]
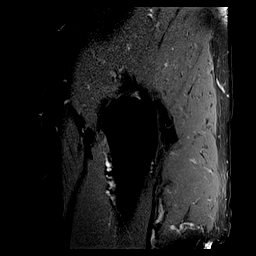
[im 25/25]
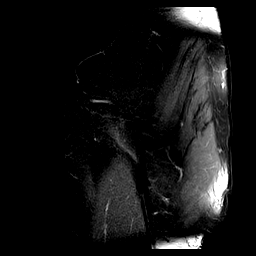

[Series 12: PD fat-sat · coronal · left · 4.0mm · 0.70mm/px · 6 of 20 slices shown (2 of 2)]
[im 1/20]
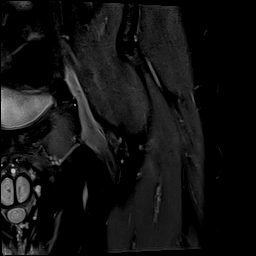
[im 4/20]
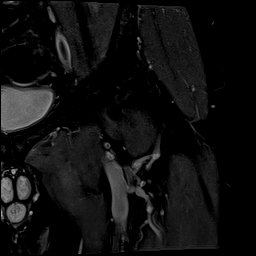
[im 8/20]
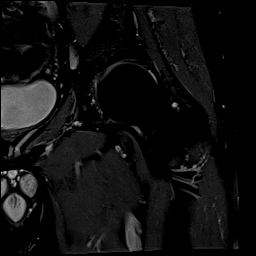
[im 12/20]
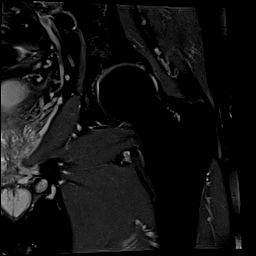
[im 16/20]
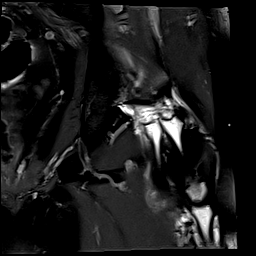
[im 20/20]
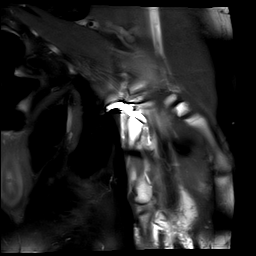

[40 of 40 positions shown; findings below may reference images not displayed]

FINDINGS: Bones: There is no evidence of acute fracture, dislocation or
avascular necrosis. No focal bone lesion. The visualized sacroiliac
joints and symphysis pubis appear normal.

Articular cartilage and labrum

Articular cartilage: Mild partial-thickness cartilage loss in both
hip joints with small subchondral cysts in both acetabular roofs.

Labrum: Fraying and small tear of the left posterosuperior labrum
(series 12, image 13). Probable small tear of the right superior
labrum (series 9, image 19). No paralabral abnormality.

Joint or bursal effusion

Joint effusion: No significant hip joint effusion.

Bursae: No focal periarticular fluid collection.

Muscles and tendons

Muscles and tendons: The visualized gluteus, hamstring and iliopsoas
tendons appear normal. No muscle edema or atrophy.

Other findings

Miscellaneous: The visualized internal pelvic contents appear
unremarkable.
IMPRESSION: 1. No trochanteric bursitis.
2. Mild bilateral hip osteoarthritis.
3. Fraying and small tear of the left posterosuperior labrum.
4. Probable small tear of the right superior labrum.

## 2021-09-11 ENCOUNTER — Encounter: Payer: Self-pay | Admitting: Orthopaedic Surgery

## 2021-09-11 ENCOUNTER — Ambulatory Visit (INDEPENDENT_AMBULATORY_CARE_PROVIDER_SITE_OTHER): Payer: 59 | Admitting: Orthopaedic Surgery

## 2021-09-11 ENCOUNTER — Other Ambulatory Visit: Payer: Self-pay

## 2021-09-11 DIAGNOSIS — M25552 Pain in left hip: Secondary | ICD-10-CM | POA: Diagnosis not present

## 2021-09-11 DIAGNOSIS — G8929 Other chronic pain: Secondary | ICD-10-CM | POA: Diagnosis not present

## 2021-09-11 NOTE — Progress Notes (Signed)
My hip is better.  He has less pain of the hip on the left.  He had MRI which showed: IMPRESSION: 1. No trochanteric bursitis. 2. Mild bilateral hip osteoarthritis. 3. Fraying and small tear of the left posterosuperior labrum. 4. Probable small tear of the right superior labrum.  I have explained the findings to him.  I have independently reviewed the MRI.    He has little pain today and full ROM.  Encounter Diagnosis  Name Primary?   Chronic left hip pain Yes   I will see as needed.  Call if any problem.  Precautions discussed.  Electronically Signed Darreld Mclean, MD 2/7/20231:49 PM

## 2021-09-17 ENCOUNTER — Other Ambulatory Visit: Payer: Self-pay | Admitting: Family

## 2021-09-30 ENCOUNTER — Other Ambulatory Visit: Payer: Self-pay | Admitting: Family

## 2021-09-30 DIAGNOSIS — E1169 Type 2 diabetes mellitus with other specified complication: Secondary | ICD-10-CM

## 2021-09-30 DIAGNOSIS — E785 Hyperlipidemia, unspecified: Secondary | ICD-10-CM

## 2021-09-30 DIAGNOSIS — R062 Wheezing: Secondary | ICD-10-CM

## 2021-10-15 ENCOUNTER — Other Ambulatory Visit: Payer: Self-pay | Admitting: Family

## 2021-10-15 DIAGNOSIS — M25559 Pain in unspecified hip: Secondary | ICD-10-CM

## 2021-10-15 DIAGNOSIS — M159 Polyosteoarthritis, unspecified: Secondary | ICD-10-CM

## 2021-10-20 ENCOUNTER — Other Ambulatory Visit: Payer: Self-pay | Admitting: Family

## 2021-10-22 ENCOUNTER — Ambulatory Visit (INDEPENDENT_AMBULATORY_CARE_PROVIDER_SITE_OTHER): Payer: 59 | Admitting: Family

## 2021-10-22 ENCOUNTER — Encounter: Payer: Self-pay | Admitting: Family

## 2021-10-22 VITALS — BP 131/66 | HR 80 | Temp 97.3°F | Ht 65.0 in | Wt 141.0 lb

## 2021-10-22 DIAGNOSIS — E1169 Type 2 diabetes mellitus with other specified complication: Secondary | ICD-10-CM

## 2021-10-22 DIAGNOSIS — N401 Enlarged prostate with lower urinary tract symptoms: Secondary | ICD-10-CM

## 2021-10-22 DIAGNOSIS — Z794 Long term (current) use of insulin: Secondary | ICD-10-CM

## 2021-10-22 DIAGNOSIS — J449 Chronic obstructive pulmonary disease, unspecified: Secondary | ICD-10-CM

## 2021-10-22 DIAGNOSIS — K219 Gastro-esophageal reflux disease without esophagitis: Secondary | ICD-10-CM | POA: Diagnosis not present

## 2021-10-22 DIAGNOSIS — M5442 Lumbago with sciatica, left side: Secondary | ICD-10-CM

## 2021-10-22 DIAGNOSIS — K5900 Constipation, unspecified: Secondary | ICD-10-CM

## 2021-10-22 DIAGNOSIS — R351 Nocturia: Secondary | ICD-10-CM

## 2021-10-22 DIAGNOSIS — G8929 Other chronic pain: Secondary | ICD-10-CM | POA: Diagnosis not present

## 2021-10-22 DIAGNOSIS — Z0000 Encounter for general adult medical examination without abnormal findings: Secondary | ICD-10-CM | POA: Diagnosis not present

## 2021-10-22 DIAGNOSIS — D509 Iron deficiency anemia, unspecified: Secondary | ICD-10-CM

## 2021-10-22 DIAGNOSIS — E1142 Type 2 diabetes mellitus with diabetic polyneuropathy: Secondary | ICD-10-CM

## 2021-10-22 DIAGNOSIS — I152 Hypertension secondary to endocrine disorders: Secondary | ICD-10-CM

## 2021-10-22 DIAGNOSIS — E785 Hyperlipidemia, unspecified: Secondary | ICD-10-CM

## 2021-10-22 DIAGNOSIS — E1159 Type 2 diabetes mellitus with other circulatory complications: Secondary | ICD-10-CM | POA: Diagnosis not present

## 2021-10-22 DIAGNOSIS — M159 Polyosteoarthritis, unspecified: Secondary | ICD-10-CM

## 2021-10-22 DIAGNOSIS — G629 Polyneuropathy, unspecified: Secondary | ICD-10-CM

## 2021-10-22 LAB — BAYER DCA HB A1C WAIVED: HB A1C (BAYER DCA - WAIVED): 7.8 % — ABNORMAL HIGH (ref 4.8–5.6)

## 2021-10-22 MED ORDER — FLUTICASONE-SALMETEROL 100-50 MCG/ACT IN AEPB: 1.0000 | INHALATION_SPRAY | Freq: Two times a day (BID) | RESPIRATORY_TRACT | 3 refills | Status: DC

## 2021-10-22 MED ORDER — GABAPENTIN 600 MG PO TABS: 600.0000 mg | ORAL_TABLET | Freq: Four times a day (QID) | ORAL | 3 refills | Status: DC | PRN

## 2021-10-22 MED ORDER — KETOROLAC TROMETHAMINE 60 MG/2ML IM SOLN
60.0000 mg | Freq: Once | INTRAMUSCULAR | Status: AC
  Administered 2021-10-22: 60 mg via INTRAMUSCULAR

## 2021-10-22 MED ORDER — METHYLPREDNISOLONE ACETATE 80 MG/ML IJ SUSP
80.0000 mg | Freq: Once | INTRAMUSCULAR | Status: AC
  Administered 2021-10-22: 80 mg via INTRAMUSCULAR

## 2021-10-22 MED ORDER — LORATADINE 10 MG PO TABS: 10.0000 mg | ORAL_TABLET | Freq: Every day | ORAL | 4 refills | Status: DC

## 2021-10-22 NOTE — Progress Notes (Signed)
? ?Subjective:  ? ? Patient ID: Jimmy Craig, male    DOB: 1951-01-30, 71 y.o.   MRN: [id number] ? ?Chief Complaint  ?Patient presents with  ? Medical Management of Chronic Issues  ?  Wants to change to advair HFA. Wants injection in his Hip for pain ortho said there was nothing they could do.  ? ?Pt presents to the office today for CPE and chronic follow up. He has chronic back pain and is followed by Neurosurgeon. He had lumbar surgery on  11/22/20.  He had a MRI in 01/2020. He reports his leg pain is improved, but having pain of 6 out 10.  ?  ?He is followed by Ortho and had a steroid injection in left hip that did not help.  ? ?He has COPD with intermittent SOB and congestion. States the congestion is worsening and the albuterol is not working.  ?Hip Pain  ?The incident occurred more than 1 week ago. There was no injury mechanism. The pain is present in the left hip. The pain is at a severity of 6/10. The pain is moderate.  ?Hypertension ?This is a chronic problem. The current episode started more than 1 year ago. The problem has been resolved since onset. The problem is controlled. Associated symptoms include malaise/fatigue and peripheral edema (at end of the day). Pertinent negatives include no blurred vision or shortness of breath. Risk factors for coronary artery disease include dyslipidemia, diabetes mellitus, male gender and sedentary lifestyle. The current treatment provides moderate improvement.  ?Gastroesophageal Reflux ?He complains of belching and heartburn. This is a chronic problem. The current episode started more than 1 year ago. The problem occurs occasionally. The problem has been waxing and waning. He has tried a PPI for the symptoms. The treatment provided moderate relief.  ?Hyperlipidemia ?This is a chronic problem. The current episode started more than 1 year ago. The problem is controlled. Recent lipid tests were reviewed and are normal. Pertinent negatives include no shortness of  breath. Current antihyperlipidemic treatment includes statins. The current treatment provides moderate improvement of lipids. Risk factors for coronary artery disease include dyslipidemia, diabetes mellitus, hypertension, male sex and a sedentary lifestyle.  ?Diabetes ?He presents for his follow-up diabetic visit. He has type 2 diabetes mellitus. Associated symptoms include foot paresthesias. Pertinent negatives for diabetes include no blurred vision. Symptoms are stable. Diabetic complications include peripheral neuropathy. Pertinent negatives for diabetic complications include no heart disease. Risk factors for coronary artery disease include dyslipidemia, diabetes mellitus, male sex, hypertension and sedentary lifestyle. He is following a generally healthy diet. His overall blood glucose range is 130-140 mg/dl. An ACE inhibitor/angiotensin II receptor blocker is being taken.  ?Arthritis ?Presents for follow-up visit. He complains of pain and stiffness. The symptoms have been worsening. Affected locations include the left knee, right knee, left MCP and right MCP. His pain is at a severity of 6/10.  ?Benign Prostatic Hypertrophy ?This is a chronic problem. The current episode started more than 1 year ago. Irritative symptoms include nocturia (2-3) and urgency. Past treatments include tamsulosin. The treatment provided moderate relief.  ?Anemia ?Presents for follow-up visit. Symptoms include malaise/fatigue.  ?Constipation ?This is a chronic problem. The current episode started more than 1 year ago. The problem has been waxing and waning since onset. His stool frequency is 2 to 3 times per week. He has tried laxatives for the symptoms. The treatment provided mild relief.  ? ? ? ?Review of Systems  ?Constitutional:  Positive for malaise/fatigue.  ?Eyes:  Negative for blurred vision.  ?Respiratory:  Negative for shortness of breath.   ?Gastrointestinal:  Positive for constipation and heartburn.  ?Genitourinary:   Positive for nocturia (2-3) and urgency.  ?Musculoskeletal:  Positive for arthritis and stiffness.  ?All other systems reviewed and are negative. ? ?Family History  ?Problem Relation Age of Onset  ? Diabetes Mother   ? ?Social History  ? ?Socioeconomic History  ? Marital status: Married  ?  Spouse name: Not on file  ? Number of children: Not on file  ? Years of education: Not on file  ? Highest education level: Not on file  ?Occupational History  ? Not on file  ?Tobacco Use  ? Smoking status: Never  ? Smokeless tobacco: Never  ?Substance and Sexual Activity  ? Alcohol use: No  ? Drug use: No  ? Sexual activity: Yes  ?  Birth control/protection: None  ?Other Topics Concern  ? Not on file  ?Social History Narrative  ? Not on file  ? ?Social Determinants of Health  ? ?Financial Resource Strain: Not on file  ?Food Insecurity: Not on file  ?Transportation Needs: Not on file  ?Physical Activity: Not on file  ?Stress: Not on file  ?Social Connections: Not on file  ? ? ?   ?Objective:  ? Physical Exam ?Vitals reviewed.  ?Constitutional:   ?   General: He is not in acute distress. ?   Appearance: He is well-developed.  ?HENT:  ?   Head: Normocephalic.  ?   Right Ear: Tympanic membrane normal.  ?   Left Ear: Tympanic membrane normal.  ?Eyes:  ?   General:     ?   Right eye: No discharge.     ?   Left eye: No discharge.  ?   Pupils: Pupils are equal, round, and reactive to light.  ?Neck:  ?   Thyroid: No thyromegaly.  ?Cardiovascular:  ?   Rate and Rhythm: Normal rate and regular rhythm.  ?   Heart sounds: Normal heart sounds. No murmur heard. ?Pulmonary:  ?   Effort: Pulmonary effort is normal. No respiratory distress.  ?   Breath sounds: Normal breath sounds. No wheezing.  ?Abdominal:  ?   General: Bowel sounds are normal. There is no distension.  ?   Palpations: Abdomen is soft.  ?   Tenderness: There is no abdominal tenderness.  ?Musculoskeletal:     ?   General: No tenderness. Normal range of motion.  ?   Cervical back:  Normal range of motion and neck supple.  ?Skin: ?   General: Skin is warm and dry.  ?   Findings: No erythema or rash.  ?Neurological:  ?   Mental Status: He is alert and oriented to person, place, and time.  ?   Cranial Nerves: No cranial nerve deficit.  ?   Deep Tendon Reflexes: Reflexes are normal and symmetric.  ?Psychiatric:     ?   Behavior: Behavior normal.     ?   Thought Content: Thought content normal.     ?   Judgment: Judgment normal.  ? ? ? ? ?BP 131/66   Pulse 80   Temp (!) 97.3 ?F (36.3 ?C) (Temporal)   Ht $R'5\' 5"'xw$  (1.651 m)   Wt 141 lb (64 kg)   BMI 23.46 kg/m?  ? ?   ?Assessment & Plan:  ?Jimmy Craig comes in today with chief complaint of Medical Management of Chronic Issues (Wants to change to advair HFA.  Wants injection in his Hip for pain ortho said there was nothing they could do.) ? ? ?Diagnosis and orders addressed: ? ?1. Hypertension associated with diabetes ([place]) ?- CMP14+EGFR ?- CBC with Differential/Platelet ? ?2. GERD without esophagitis ?- CMP14+EGFR ?- CBC with Differential/Platelet ? ?3. Type 2 diabetes mellitus with diabetic polyneuropathy, with long-term current use of insulin ([place]) ?- Bayer DCA Hb A1c Waived ?- CMP14+EGFR ?- CBC with Differential/Platelet ? ?4. Hyperlipidemia associated with type 2 diabetes mellitus ([place]) ?- CMP14+EGFR ?- CBC with Differential/Platelet ? ?5. Primary osteoarthritis involving multiple joints ?- CMP14+EGFR ?- CBC with Differential/Platelet ? ?6. Chronic low back pain with left-sided sciatica, unspecified back pain laterality ?Will increase gabapentin to QID from TID ?- gabapentin (NEURONTIN) 600 MG tablet; Take 1 tablet (600 mg total) by mouth 4 (four) times daily as needed.  Dispense: 120 tablet; Refill: 3 ?- CMP14+EGFR ?- CBC with Differential/Platelet ?- methylPREDNISolone acetate (DEPO-MEDROL) injection 80 mg ?- ketorolac (TORADOL) injection 60 mg ? ?7. Benign prostatic hyperplasia with nocturia ?- CMP14+EGFR ?- CBC with  Differential/Platelet ? ?8. Iron deficiency anemia, unspecified iron deficiency anemia type ?- CMP14+EGFR ?- CBC with Differential/Platelet ? ?9. Constipation, unspecified constipation type ?- CMP14+EGFR ?- CBC with Differential/Platelet

## 2021-10-22 NOTE — Patient Instructions (Signed)
Chronic Obstructive Pulmonary Disease ?Chronic obstructive pulmonary disease (COPD) is a long-term (chronic) condition that affects the lungs. COPD is a general term that can be used to describe many different lung problems that cause lung inflammation and limit airflow, including chronic bronchitis and emphysema. ?If you have COPD, your lung function will probably never return to normal. In most cases, it gets worse over time. However, there are steps you can take to slow the progression of the disease and improve your quality of life. ?What are the causes? ?This condition may be caused by: ?Smoking. This is the most common cause. ?Certain genes passed down through families. ?What increases the risk? ?The following factors may make you more likely to develop this condition: ?Being exposed to secondhand smoke from cigarettes, pipes, or cigars. ?Being exposed to chemicals and other irritants, such as fumes and dust in the work environment. ?Having chronic lung conditions or infections. ?What are the signs or symptoms? ?Symptoms of this condition include: ?Shortness of breath, especially during physical activity. ?Chronic cough with a large amount of thick mucus. Sometimes, the cough may not have any mucus (dry cough). ?Wheezing and rapid breathing. ?Gray or bluish discoloration (cyanosis) of the skin, especially in the fingers, toes, or lips. ?Feeling tired (fatigue). ?Weight loss. ?Chest tightness. ?Frequent infections. ?Episodes when breathing symptoms become much worse (exacerbations). ?At the later stages of this disease, you may have swelling in the ankles, feet, or legs. ?How is this diagnosed? ?This condition is diagnosed based on: ?Your medical history. ?A physical exam. ?You may also have tests, including: ?Lung (pulmonary) function tests. This may include a spirometry test, which measures your ability to exhale properly. ?Chest X-ray. ?CT scan. ?Blood tests. ?How is this treated? ?This condition may be  treated with: ?Medicines. These may include inhaled rescue medicines to treat acute exacerbations as well as medicines that you take long-term (maintenance medicines) to prevent flare-ups of COPD. ?Bronchodilators help treat COPD by dilating the airways to allow increased airflow and make your breathing more comfortable. ?Steroids can reduce airway inflammation and help prevent exacerbations. ?Smoking cessation. If you smoke, your health care provider may ask you to quit, and may also recommend therapy or replacement products to help you quit. ?Pulmonary rehabilitation. This may involve working with a team of health care providers and specialists, such as respiratory, occupational, and physical therapists. ?Exercise and physical activity. These are beneficial for nearly all people with COPD. ?Nutrition therapy to gain weight, if you are underweight. ?Oxygen. Supplemental oxygen therapy is only helpful if you have a low oxygen level in your blood (hypoxemia). ?Lung surgery or transplant. ?Palliative care. This is to help people with COPD feel comfortable when treatment is no longer working. ?Follow these instructions at home: ?Medicines ?Take over-the-counter and prescription medicines only as told by your health care provider. This includes inhaled medicines and pills. ?Talk to your health care provider before taking any cough or allergy medicines. You may need to avoid certain medicines that dry out your airways. ?Lifestyle ?If you smoke, the most important thing that you can do is to stop smoking. Continuing to smoke will cause the disease to progress faster. ?Do not use any products that contain nicotine or tobacco. These products include cigarettes, chewing tobacco, and vaping devices, such as e-cigarettes. If you need help quitting, ask your health care provider. ?Avoid exposure to things that irritate your lungs, such as smoke, chemicals, and fumes. ?Stay active, but balance activity with periods of rest.  Exercise and physical   activity will help you maintain your ability to do things you want to do. ?Learn and use relaxation techniques to manage stress and to control your breathing. ?Get the right amount of sleep and get quality sleep. Most adults need 7 or more hours per night. ?Eat healthy foods. Eating smaller, more frequent meals and resting before meals may help you maintain your strength. ?Controlled breathing ?Learn and use controlled breathing techniques as directed by your health care provider. Controlled breathing techniques include: ?Pursed lip breathing. Start by breathing in (inhaling) through your nose for 1 second. Then, purse your lips as if you were going to whistle and breathe out (exhale) through the pursed lips for 2 seconds. ?Diaphragmatic breathing. Start by putting one hand on your abdomen just above your waist. Inhale slowly through your nose. The hand on your abdomen should move out. Then purse your lips and exhale slowly. You should be able to feel the hand on your abdomen moving in as you exhale. ? ?Controlled coughing ?Learn and use controlled coughing to clear mucus from your lungs. Controlled coughing is a series of short, progressive coughs. The steps of controlled coughing are: ?Lean your head slightly forward. ?Breathe in deeply using diaphragmatic breathing. ?Try to hold your breath for 3 seconds. ?Keep your mouth slightly open while coughing twice. ?Spit any mucus out into a tissue. ?Rest and repeat the steps once or twice as needed. ?General instructions ?Make sure you receive all the vaccines that your health care provider recommends, especially the pneumococcal and influenza vaccines. Preventing infection and hospitalization is very important when you have COPD. ?Drink enough fluid to keep your urine pale yellow, unless you have a medical condition that requires fluid restriction. ?Use oxygen therapy and pulmonary rehabilitation if told by your health care provider. If you  require home oxygen therapy, ask your health care provider whether you should purchase a pulse oximeter to measure your oxygen level at home. ?Work with your health care provider to develop a COPD action plan. This will help you know what steps to take if your condition gets worse. ?Keep other chronic health conditions under control as told by your health care provider. ?Avoid extreme temperature and humidity changes. ?Avoid contact with people who have an illness that spreads from person to person (is contagious), such as viral infections or pneumonia. ?Keep all follow-up visits. This is important. ?Contact a health care provider if: ?You are coughing up more mucus than usual. ?There is a change in the color or thickness of your mucus. ?Your breathing is more labored than usual. ?Your breathing is faster than usual. ?You have difficulty sleeping. ?You need to use your rescue medicines or inhalers more often than expected. ?You have trouble doing routine activities such as getting dressed or walking around the house. ?Get help right away if: ?You have shortness of breath while you are resting. ?You have shortness of breath that prevents you from: ?Being able to talk. ?Performing your usual physical activities. ?You have chest pain lasting longer than 5 minutes. ?Your skin color is more blue (cyanotic) than usual. ?You measure low oxygen saturations for longer than 5 minutes with a pulse oximeter. ?You have a fever. ?You feel too tired to breathe normally. ?These symptoms may represent a serious problem that is an emergency. Do not wait to see if the symptoms will go away. Get medical help right away. Call your local emergency services (911 in the [place] Do not drive yourself to the hospital. ?Summary ?Chronic obstructive pulmonary   disease (COPD) is a long-term (chronic) condition that affects the lungs. ?Your lung function will probably never return to normal. In most cases, it gets worse over time. However, there  are steps you can take to slow the progression of the disease and improve your quality of life. ?Treatment for COPD may include taking medicines, quitting smoking, pulmonary rehabilitation, and changes to diet and e

## 2021-10-23 ENCOUNTER — Other Ambulatory Visit: Payer: Self-pay | Admitting: Family

## 2021-10-23 LAB — CBC WITH DIFFERENTIAL/PLATELET
Basophils Absolute: 0 10*3/uL (ref 0.0–0.2)
Basos: 1 %
EOS (ABSOLUTE): 0.1 10*3/uL (ref 0.0–0.4)
Eos: 2 %
Hematocrit: 41.3 % (ref 37.5–51.0)
Hemoglobin: 13.6 g/dL (ref 13.0–17.7)
Immature Grans (Abs): 0 10*3/uL (ref 0.0–0.1)
Immature Granulocytes: 0 %
Lymphocytes Absolute: 1.8 10*3/uL (ref 0.7–3.1)
Lymphs: 35 %
MCH: 28.2 pg (ref 26.6–33.0)
MCHC: 32.9 g/dL (ref 31.5–35.7)
MCV: 86 fL (ref 79–97)
Monocytes Absolute: 0.6 10*3/uL (ref 0.1–0.9)
Monocytes: 12 %
Neutrophils Absolute: 2.6 10*3/uL (ref 1.4–7.0)
Neutrophils: 50 %
Platelets: 291 10*3/uL (ref 150–450)
RBC: 4.83 x10E6/uL (ref 4.14–5.80)
RDW: 13.1 % (ref 11.6–15.4)
WBC: 5.1 10*3/uL (ref 3.4–10.8)

## 2021-10-23 LAB — LIPID PANEL
Chol/HDL Ratio: 3.1 ratio (ref 0.0–5.0)
Cholesterol, Total: 113 mg/dL (ref 100–199)
HDL: 37 mg/dL — ABNORMAL LOW (ref >39)
LDL Chol Calc (NIH): 55 mg/dL (ref 0–99)
Triglycerides: 117 mg/dL (ref 0–149)
VLDL Cholesterol Cal: 21 mg/dL (ref 5–40)

## 2021-10-23 LAB — CMP14+EGFR
ALT: 27 IU/L (ref 0–44)
AST: 23 IU/L (ref 0–40)
Albumin/Globulin Ratio: 1.8 (ref 1.2–2.2)
Albumin: 4.5 g/dL (ref 3.8–4.8)
Alkaline Phosphatase: 100 IU/L (ref 44–121)
BUN/Creatinine Ratio: 27 — ABNORMAL HIGH (ref 10–24)
BUN: 26 mg/dL (ref 8–27)
Bilirubin Total: <0.2 mg/dL (ref 0.0–1.2)
CO2: 25 mmol/L (ref 20–29)
Calcium: 9.5 mg/dL (ref 8.6–10.2)
Chloride: 99 mmol/L (ref 96–106)
Creatinine, Ser: 0.95 mg/dL (ref 0.76–1.27)
Globulin, Total: 2.5 g/dL (ref 1.5–4.5)
Glucose: 233 mg/dL — ABNORMAL HIGH (ref 70–99)
Potassium: 4.5 mmol/L (ref 3.5–5.2)
Sodium: 139 mmol/L (ref 134–144)
Total Protein: 7 g/dL (ref 6.0–8.5)
eGFR: 87 mL/min/{1.73_m2} (ref >59)

## 2021-10-23 LAB — TSH: TSH: 6.31 u[IU]/mL — ABNORMAL HIGH (ref 0.450–4.500)

## 2021-10-24 ENCOUNTER — Encounter: Payer: Self-pay | Admitting: Family Medicine

## 2021-10-31 ENCOUNTER — Other Ambulatory Visit: Payer: Self-pay | Admitting: Family Medicine

## 2021-10-31 ENCOUNTER — Other Ambulatory Visit: Payer: Self-pay | Admitting: Family

## 2021-11-02 ENCOUNTER — Other Ambulatory Visit: Payer: Self-pay | Admitting: Family

## 2021-11-21 ENCOUNTER — Other Ambulatory Visit: Payer: Self-pay

## 2021-11-21 DIAGNOSIS — N401 Enlarged prostate with lower urinary tract symptoms: Secondary | ICD-10-CM

## 2021-11-21 MED ORDER — TAMSULOSIN HCL 0.4 MG PO CAPS: 0.4000 mg | ORAL_CAPSULE | Freq: Two times a day (BID) | ORAL | 3 refills | Status: DC

## 2021-12-08 ENCOUNTER — Other Ambulatory Visit: Payer: Self-pay | Admitting: Family

## 2021-12-13 ENCOUNTER — Other Ambulatory Visit: Payer: Self-pay | Admitting: Family

## 2021-12-13 DIAGNOSIS — E1142 Type 2 diabetes mellitus with diabetic polyneuropathy: Secondary | ICD-10-CM

## 2021-12-22 ENCOUNTER — Other Ambulatory Visit: Payer: Self-pay | Admitting: Family

## 2022-01-07 ENCOUNTER — Other Ambulatory Visit: Payer: Self-pay | Admitting: Family

## 2022-01-07 DIAGNOSIS — M25559 Pain in unspecified hip: Secondary | ICD-10-CM

## 2022-01-07 DIAGNOSIS — E1169 Type 2 diabetes mellitus with other specified complication: Secondary | ICD-10-CM

## 2022-01-07 DIAGNOSIS — M159 Polyosteoarthritis, unspecified: Secondary | ICD-10-CM

## 2022-01-15 ENCOUNTER — Other Ambulatory Visit: Payer: Self-pay | Admitting: Family

## 2022-01-18 ENCOUNTER — Other Ambulatory Visit: Payer: Self-pay | Admitting: Family

## 2022-01-18 DIAGNOSIS — Z794 Long term (current) use of insulin: Secondary | ICD-10-CM

## 2022-01-29 ENCOUNTER — Encounter: Payer: Self-pay | Admitting: Family

## 2022-01-29 ENCOUNTER — Ambulatory Visit (INDEPENDENT_AMBULATORY_CARE_PROVIDER_SITE_OTHER): Payer: 59 | Admitting: Family

## 2022-01-29 VITALS — BP 117/63 | HR 78 | Temp 97.4°F | Ht 65.0 in | Wt 144.8 lb

## 2022-01-29 DIAGNOSIS — E1142 Type 2 diabetes mellitus with diabetic polyneuropathy: Secondary | ICD-10-CM

## 2022-01-29 DIAGNOSIS — E785 Hyperlipidemia, unspecified: Secondary | ICD-10-CM

## 2022-01-29 DIAGNOSIS — J449 Chronic obstructive pulmonary disease, unspecified: Secondary | ICD-10-CM | POA: Diagnosis not present

## 2022-01-29 DIAGNOSIS — E1159 Type 2 diabetes mellitus with other circulatory complications: Secondary | ICD-10-CM

## 2022-01-29 DIAGNOSIS — Z23 Encounter for immunization: Secondary | ICD-10-CM | POA: Diagnosis not present

## 2022-01-29 DIAGNOSIS — M5442 Lumbago with sciatica, left side: Secondary | ICD-10-CM

## 2022-01-29 DIAGNOSIS — N529 Male erectile dysfunction, unspecified: Secondary | ICD-10-CM

## 2022-01-29 DIAGNOSIS — I152 Hypertension secondary to endocrine disorders: Secondary | ICD-10-CM

## 2022-01-29 DIAGNOSIS — N401 Enlarged prostate with lower urinary tract symptoms: Secondary | ICD-10-CM

## 2022-01-29 DIAGNOSIS — E1169 Type 2 diabetes mellitus with other specified complication: Secondary | ICD-10-CM

## 2022-01-29 DIAGNOSIS — Z794 Long term (current) use of insulin: Secondary | ICD-10-CM

## 2022-01-29 DIAGNOSIS — R351 Nocturia: Secondary | ICD-10-CM

## 2022-01-29 DIAGNOSIS — K219 Gastro-esophageal reflux disease without esophagitis: Secondary | ICD-10-CM

## 2022-01-29 DIAGNOSIS — M159 Polyosteoarthritis, unspecified: Secondary | ICD-10-CM

## 2022-01-29 DIAGNOSIS — G8929 Other chronic pain: Secondary | ICD-10-CM

## 2022-01-29 DIAGNOSIS — D509 Iron deficiency anemia, unspecified: Secondary | ICD-10-CM

## 2022-01-29 LAB — BAYER DCA HB A1C WAIVED: HB A1C (BAYER DCA - WAIVED): 6.9 % — ABNORMAL HIGH (ref 4.8–5.6)

## 2022-01-29 MED ORDER — SILDENAFIL CITRATE 100 MG PO TABS: 50.0000 mg | ORAL_TABLET | Freq: Every day | ORAL | 2 refills | Status: DC | PRN

## 2022-01-30 LAB — CBC WITH DIFFERENTIAL/PLATELET
Basophils Absolute: 0 10*3/uL (ref 0.0–0.2)
Basos: 1 %
EOS (ABSOLUTE): 0.2 10*3/uL (ref 0.0–0.4)
Eos: 3 %
Hematocrit: 36.7 % — ABNORMAL LOW (ref 37.5–51.0)
Hemoglobin: 11.8 g/dL — ABNORMAL LOW (ref 13.0–17.7)
Immature Grans (Abs): 0 10*3/uL (ref 0.0–0.1)
Immature Granulocytes: 1 %
Lymphocytes Absolute: 1.7 10*3/uL (ref 0.7–3.1)
Lymphs: 27 %
MCH: 26.9 pg (ref 26.6–33.0)
MCHC: 32.2 g/dL (ref 31.5–35.7)
MCV: 84 fL (ref 79–97)
Monocytes Absolute: 0.6 10*3/uL (ref 0.1–0.9)
Monocytes: 10 %
Neutrophils Absolute: 3.6 10*3/uL (ref 1.4–7.0)
Neutrophils: 58 %
Platelets: 329 10*3/uL (ref 150–450)
RBC: 4.38 x10E6/uL (ref 4.14–5.80)
RDW: 14.6 % (ref 11.6–15.4)
WBC: 6.1 10*3/uL (ref 3.4–10.8)

## 2022-01-30 LAB — MICROALBUMIN / CREATININE URINE RATIO
Creatinine, Urine: 74.4 mg/dL
Microalb/Creat Ratio: 6 mg/g creat (ref 0–29)
Microalbumin, Urine: 4.6 ug/mL

## 2022-01-30 LAB — CMP14+EGFR
ALT: 21 IU/L (ref 0–44)
AST: 21 IU/L (ref 0–40)
Albumin/Globulin Ratio: 1.6 (ref 1.2–2.2)
Albumin: 4.4 g/dL (ref 3.8–4.8)
Alkaline Phosphatase: 91 IU/L (ref 44–121)
BUN/Creatinine Ratio: 25 — ABNORMAL HIGH (ref 10–24)
BUN: 25 mg/dL (ref 8–27)
Bilirubin Total: 0.2 mg/dL (ref 0.0–1.2)
CO2: 22 mmol/L (ref 20–29)
Calcium: 9.3 mg/dL (ref 8.6–10.2)
Chloride: 107 mmol/L — ABNORMAL HIGH (ref 96–106)
Creatinine, Ser: 1.01 mg/dL (ref 0.76–1.27)
Globulin, Total: 2.7 g/dL (ref 1.5–4.5)
Glucose: 143 mg/dL — ABNORMAL HIGH (ref 70–99)
Potassium: 4.7 mmol/L (ref 3.5–5.2)
Sodium: 142 mmol/L (ref 134–144)
Total Protein: 7.1 g/dL (ref 6.0–8.5)
eGFR: 81 mL/min/{1.73_m2} (ref >59)

## 2022-02-19 ENCOUNTER — Other Ambulatory Visit: Payer: Self-pay | Admitting: Family

## 2022-02-19 DIAGNOSIS — E1142 Type 2 diabetes mellitus with diabetic polyneuropathy: Secondary | ICD-10-CM

## 2022-03-14 ENCOUNTER — Other Ambulatory Visit: Payer: Self-pay | Admitting: Family

## 2022-03-15 ENCOUNTER — Other Ambulatory Visit: Payer: Self-pay | Admitting: Family

## 2022-03-19 ENCOUNTER — Other Ambulatory Visit: Payer: Self-pay | Admitting: Family

## 2022-03-19 DIAGNOSIS — Z794 Long term (current) use of insulin: Secondary | ICD-10-CM

## 2022-03-28 ENCOUNTER — Other Ambulatory Visit: Payer: Self-pay | Admitting: Family

## 2022-03-28 DIAGNOSIS — M25559 Pain in unspecified hip: Secondary | ICD-10-CM

## 2022-03-28 DIAGNOSIS — M159 Polyosteoarthritis, unspecified: Secondary | ICD-10-CM

## 2022-04-07 ENCOUNTER — Other Ambulatory Visit: Payer: Self-pay | Admitting: Family

## 2022-04-25 ENCOUNTER — Encounter (INDEPENDENT_AMBULATORY_CARE_PROVIDER_SITE_OTHER): Payer: Self-pay | Admitting: Ophthalmology

## 2022-04-25 ENCOUNTER — Ambulatory Visit (INDEPENDENT_AMBULATORY_CARE_PROVIDER_SITE_OTHER): Payer: 59 | Admitting: Ophthalmology

## 2022-04-25 ENCOUNTER — Encounter (INDEPENDENT_AMBULATORY_CARE_PROVIDER_SITE_OTHER): Payer: 59 | Admitting: Ophthalmology

## 2022-04-25 ENCOUNTER — Encounter (INDEPENDENT_AMBULATORY_CARE_PROVIDER_SITE_OTHER): Payer: Self-pay

## 2022-04-25 DIAGNOSIS — E113413 Type 2 diabetes mellitus with severe nonproliferative diabetic retinopathy with macular edema, bilateral: Secondary | ICD-10-CM

## 2022-04-25 DIAGNOSIS — H33301 Unspecified retinal break, right eye: Secondary | ICD-10-CM

## 2022-04-25 DIAGNOSIS — H4311 Vitreous hemorrhage, right eye: Secondary | ICD-10-CM | POA: Diagnosis not present

## 2022-04-25 LAB — HM DIABETES EYE EXAM

## 2022-04-25 NOTE — Assessment & Plan Note (Signed)
OD with large streaming vitreous and preretinal hemorrhage from a emanating from the retinal tear with vitreal traction continuing at 12 o'clock position.  We will recommend laser retinopexy around this region  I explained to the patient that laser repair of the retinal tear will not clear the hemorrhage that the hemorrhage will clear spontaneously over time usually weeks but if not surgical intervention could be undertaken if he needs improvement in acuity before that.

## 2022-04-25 NOTE — Assessment & Plan Note (Signed)
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

## 2022-04-26 ENCOUNTER — Other Ambulatory Visit: Payer: Self-pay | Admitting: Family

## 2022-04-26 DIAGNOSIS — E1142 Type 2 diabetes mellitus with diabetic polyneuropathy: Secondary | ICD-10-CM

## 2022-04-29 ENCOUNTER — Encounter (INDEPENDENT_AMBULATORY_CARE_PROVIDER_SITE_OTHER): Payer: Self-pay | Admitting: Ophthalmology

## 2022-04-29 ENCOUNTER — Ambulatory Visit (INDEPENDENT_AMBULATORY_CARE_PROVIDER_SITE_OTHER): Payer: 59 | Admitting: Ophthalmology

## 2022-04-29 DIAGNOSIS — H4311 Vitreous hemorrhage, right eye: Secondary | ICD-10-CM

## 2022-04-29 DIAGNOSIS — H33301 Unspecified retinal break, right eye: Secondary | ICD-10-CM | POA: Diagnosis not present

## 2022-04-29 MED ORDER — PREDNISOLONE ACETATE 1 % OP SUSP: 1.0000 [drp] | Freq: Four times a day (QID) | OPHTHALMIC | 0 refills | Status: AC

## 2022-04-29 MED ORDER — OFLOXACIN 0.3 % OP SOLN: 1.0000 [drp] | Freq: Four times a day (QID) | OPHTHALMIC | 0 refills | Status: DC

## 2022-04-29 NOTE — Progress Notes (Signed)
04/29/2022     CHIEF COMPLAINT Patient presents for  Chief Complaint  Patient presents with   Retina Follow Up    Retinal tear plan for laser retinopexy today but extensive vitreous hemorrhage is developed with blurred vision and decreased vision  HISTORY OF PRESENT ILLNESS: Lang Jimmy Craig is a 71 y.o. male who presents to the clinic today for:   HPI     Retina Follow Up           Diagnosis: Other   Laterality: right eye   Severity: moderate   Course: stable         Comments   4 days for RETINOPEXY OD.  Retinal break at 12 meridian with transected retinal vessel triggering vit heme Pt stated, "I haven't noticed any changes but now I feel like it has spread."      Last edited by Hurman Horn, MD on 04/29/2022  2:35 PM.      Referring physician: Sharion Balloon, [address] [place],  [place] [address]  HISTORICAL INFORMATION:   Selected notes from the MEDICAL RECORD NUMBER    Lab Results  Component Value Date   HGBA1C 6.9 (H) 01/29/2022     CURRENT MEDICATIONS: Current Outpatient Medications (Ophthalmic Drugs)  Medication Sig   azelastine (OPTIVAR) 0.05 % ophthalmic solution Place 1 drop into both eyes 2 (two) times daily.   olopatadine (PATADAY) 0.1 % ophthalmic solution Place 1 drop into both eyes 2 (two) times daily.   No current facility-administered medications for this visit. (Ophthalmic Drugs)   Current Outpatient Medications (Other)  Medication Sig   albuterol (VENTOLIN HFA) 108 (90 Base) MCG/ACT inhaler INHALE 2 PUFFS BY MOUTH EVERY 6 HOURS AS NEEDED FOR WHEEZING FOR SHORTNESS OF BREATH   aspirin EC 81 MG tablet Take 1 tablet (81 mg total) by mouth daily. Swallow whole.   atorvastatin (LIPITOR) 40 MG tablet Take 1 tablet by mouth once daily   baclofen (LIORESAL) 10 MG tablet TAKE 1 TABLET BY MOUTH THREE TIMES DAILY   blood glucose meter kit and supplies Dispense based on patient and insurance preference. Use up to four times daily  as directed. (FOR ICD-10 E10.9, E11.9).   celecoxib (CELEBREX) 200 MG capsule Take 1 capsule by mouth once daily   clopidogrel (PLAVIX) 75 MG tablet Take 1 tablet by mouth once daily   fluticasone (FLONASE) 50 MCG/ACT nasal spray Place 2 sprays into both nostrils daily.   fluticasone-salmeterol (ADVAIR) 100-50 MCG/ACT AEPB Inhale 1 puff into the lungs 2 (two) times daily.   gabapentin (NEURONTIN) 600 MG tablet Take 1 tablet (600 mg total) by mouth 4 (four) times daily as needed.   glucose blood (ACCU-CHEK GUIDE) test strip Use 1 strip up to 4 times daily.  E10.9, E11.9   Insulin Pen Needle (PEN NEEDLES) 32G X 4 MM MISC 1 application by Does not apply route daily.   JARDIANCE 25 MG TABS tablet TAKE 1 TABLET BY MOUTH ONCE DAILY BEFORE BREAKFAST   Lancets (ONETOUCH DELICA PLUS [id number]) MISC Apply 1 each topically 4 (four) times daily.   lisinopril (ZESTRIL) 10 MG tablet Take 1 tablet (10 mg total) by mouth daily.   loratadine (CLARITIN) 10 MG tablet Take 1 tablet (10 mg total) by mouth daily.   metFORMIN (GLUCOPHAGE) 1000 MG tablet Take 1 tablet by mouth twice daily with food   Multiple Vitamin (MULTIVITAMIN WITH MINERALS) TABS tablet Take 1 tablet by mouth daily.   pantoprazole (PROTONIX) 40 MG tablet  Take 1 tablet by mouth once daily   polyethylene glycol powder (GLYCOLAX/MIRALAX) 17 GM/SCOOP powder Take 17 g by mouth 2 (two) times daily as needed.   sildenafil (VIAGRA) 100 MG tablet Take 0.5-1 tablets (50-100 mg total) by mouth daily as needed for erectile dysfunction.   tamsulosin (FLOMAX) 0.4 MG CAPS capsule Take 1 capsule (0.4 mg total) by mouth in the morning and at bedtime.   TRESIBA FLEXTOUCH 100 UNIT/ML FlexTouch Pen INJECT 42 UNITS SUBCUTANEOUSLY ONCE DAILY **DISCONTINUE  LEVEMIR**   TRULICITY 3 OV/2.9VB SOPN INJECT 3 MG SUBCUTANEOUSLY ONCE A WEEK   No current facility-administered medications for this visit. (Other)      REVIEW OF SYSTEMS: ROS   Negative for: Constitutional,  Gastrointestinal, Neurological, Skin, Genitourinary, Musculoskeletal, HENT, Endocrine, Cardiovascular, Eyes, Respiratory, Psychiatric, Allergic/Imm, Heme/Lymph Last edited by Silvestre Moment on 04/29/2022  1:46 PM.       ALLERGIES No Known Allergies  PAST MEDICAL HISTORY Past Medical History:  Diagnosis Date   Arthritis    BPH (benign prostatic hyperplasia)    Chronic back pain    GERD (gastroesophageal reflux disease)    Hypertension    Mixed hyperlipidemia    Type 2 diabetes mellitus ([place])    Past Surgical History:  Procedure Laterality Date   CATARACT EXTRACTION Right    CATARACT EXTRACTION W/PHACO Left 12/20/2013   Procedure: CATARACT EXTRACTION PHACO AND INTRAOCULAR LENS PLACEMENT ([place]);  Surgeon: Williams Che, MD;  Location: [facility];  Service: Ophthalmology;  Laterality: Left;  CDE: 0.97    FAMILY HISTORY Family History  Problem Relation Age of Onset   Diabetes Mother     SOCIAL HISTORY Social History   Tobacco Use   Smoking status: Never   Smokeless tobacco: Never  Substance Use Topics   Alcohol use: No   Drug use: No         OPHTHALMIC EXAM:  Base Eye Exam     Visual Acuity (ETDRS)       Right Left   Dist [place] 20/400 20/100 -2   Dist ph [place] NI 20/40 -2         Tonometry (Tonopen, 1:50 PM)       Right Left   Pressure 15 16         Pupils       Pupils APD   Right PERRL None   Left PERRL None         Visual Fields       Left Right    Full Full         Extraocular Movement       Right Left    Full, Ortho Full, Ortho         Neuro/Psych     Oriented x3: Yes         Dilation     Right eye: 2.5% Phenylephrine, 1.0% Mydriacyl @ 1:50 PM           Slit Lamp and Fundus Exam     External Exam       Right Left   External Normal Normal         Slit Lamp Exam       Right Left   Lids/Lashes Normal Normal   Conjunctiva/Sclera White and quiet White and quiet   Cornea Clear Clear   Anterior Chamber Deep and quiet  Deep and quiet   Iris Round and reactive Round and reactive   Lens Centered posterior chamber intraocular lens Centered posterior chamber intraocular  lens   Anterior Vitreous Normal Normal         Fundus Exam       Right Left   Posterior Vitreous Increasing preretinal vitreous hemorrhage right eye will need B-scan no details now visible posteriorly except for faint view of nerve    Disc Faintly seen    C/D Ratio Details today    Macula Normal, preretinal vitreous hemorrhage in the visual axis    Vessels No details    Periphery No details today dense preretinal vitreous hemorrhage nearly 360             IMAGING AND PROCEDURES  Imaging and Procedures for 04/29/22  B-Scan Ultrasound - OD - Right Eye       Quality was good. Findings included vitreous hemorrhage, vitreous opacities.   Notes Dense progression of vitreous hemorrhage in the right eye with retinal tear confirmed at 12:00 meridian.  No view clinically cannot treat this without progression to vitrectomy endolaser right eye             ASSESSMENT/PLAN:  Vitreous hemorrhage, right eye (HCC) Previous localized vitreous hemorrhage emanating from the 12:00 meridian has now spread and is now extensively Intergel as well as preretinal.  Hampering vision and hampering visualization not able to perform laser retinopexy today  OD B-scan ultrasonography confirms the absence of progression of of retinal detachment OD.  Large retinal tear noted at 12:00 meridian and dense preretinal vitreous hemorrhage not obscuring view thus not able to deliver laser retinopexy here.  In order to prevent development retinal detachment we will proceed promptly with vitrectomy endolaser clearance of the visual axis.  Patient confirms she is not using any blood thinners aspirin or Plavix at this time      ICD-10-CM   1. Vitreous hemorrhage, right eye (HCC)  H43.11 B-Scan Ultrasound - OD - Right Eye    2. Retinal hole or tear, right   H33.301 CANCELED: Repair Retinal Breaks, Laser - OD - Right Eye      1.  Risk benefits reviewed with the patient and clinical course of undertaking.  He will require vitrectomy endolaser right eye to clear the visual axis and to repair the retinal tear long-term.  2.  Explained to the patient that procedure last about 15 to 20 minutes.  Local anesthesia.  Similar experience for him as cataract surgery but slightly longer.  I explained he will patch on for 1 night follow-up the next day here in the office  3.  Required topical medications commencement today  Ophthalmic Meds Ordered this visit:  No orders of the defined types were placed in this encounter.      Return ,, [facility], [facility], for Schedule vitrectomy endolaser repair retinal tear right eye, OD.  Patient Instructions  Patient confirms he is not using aspirin nor is he using Plavix or or the generic for Plavix he is on "no blood thinners."  Explained the diagnoses, plan, and follow up with the patient and they expressed understanding.  Patient expressed understanding of the importance of proper follow up care.   Gary A. Rankin M.D. Diseases & Surgery of the Retina and Vitreous [facility] 04/29/22     Abbreviations: M myopia (nearsighted); A astigmatism; H hyperopia (farsighted); P presbyopia; Mrx spectacle prescription;  CTL contact lenses; OD right eye; OS left eye; OU both eyes  XT exotropia; ET esotropia; PEK punctate epithelial keratitis; PEE punctate epithelial erosions; DES dry eye syndrome; MGD meibomian gland   dysfunction; ATs artificial tears; PFAT's preservative free artificial tears; NSC nuclear sclerotic cataract; PSC posterior subcapsular cataract; ERM epi-retinal membrane; PVD posterior vitreous detachment; RD retinal detachment; DM diabetes mellitus; DR diabetic retinopathy; NPDR non-proliferative diabetic retinopathy; PDR proliferative diabetic retinopathy; CSME clinically  significant macular edema; DME diabetic macular edema; dbh dot blot hemorrhages; CWS cotton wool spot; POAG primary open angle glaucoma; C/D cup-to-disc ratio; HVF humphrey visual field; GVF goldmann visual field; OCT optical coherence tomography; IOP intraocular pressure; BRVO Branch retinal vein occlusion; CRVO central retinal vein occlusion; CRAO central retinal artery occlusion; BRAO branch retinal artery occlusion; RT retinal tear; SB scleral buckle; PPV pars plana vitrectomy; VH Vitreous hemorrhage; PRP panretinal laser photocoagulation; IVK intravitreal kenalog; VMT vitreomacular traction; MH Macular hole;  NVD neovascularization of the disc; NVE neovascularization elsewhere; AREDS age related eye disease study; ARMD age related macular degeneration; POAG primary open angle glaucoma; EBMD epithelial/anterior basement membrane dystrophy; ACIOL anterior chamber intraocular lens; IOL intraocular lens; PCIOL posterior chamber intraocular lens; Phaco/IOL phacoemulsification with intraocular lens placement; PRK photorefractive keratectomy; LASIK laser assisted in situ keratomileusis; HTN hypertension; DM diabetes mellitus; COPD chronic obstructive pulmonary disease 

## 2022-04-29 NOTE — Patient Instructions (Signed)
Patient confirms he is not using aspirin nor is he using Plavix or or the generic for Plavix he is on "no blood thinners."

## 2022-04-29 NOTE — Assessment & Plan Note (Signed)
Previous localized vitreous hemorrhage emanating from the 12:00 meridian has now spread and is now extensively Intergel as well as preretinal.  Hampering vision and hampering visualization not able to perform laser retinopexy today  OD B-scan ultrasonography confirms the absence of progression of of retinal detachment OD.  Large retinal tear noted at 12:00 meridian and dense preretinal vitreous hemorrhage not obscuring view thus not able to deliver laser retinopexy here.  In order to prevent development retinal detachment we will proceed promptly with vitrectomy endolaser clearance of the visual axis.  Patient confirms she is not using any blood thinners aspirin or Plavix at this time

## 2022-04-29 NOTE — Progress Notes (Signed)
04/25/2022     CHIEF COMPLAINT Patient presents for  Chief Complaint  Patient presents with   Retina Evaluation      HISTORY OF PRESENT ILLNESS: Jimmy Craig is a 71 y.o. male who presents to the clinic today for:   HPI   NP- possible retinal tear od- floaters ref by dr Gillis Ends Pt states his vision has not been stable Pt admits to floaters  Pt denies FOL Pt states for about two weeks he has had a lot of coughing and sneezing and that's when he noticed a big black spot come at the bottom of his eye. Pt states he was driving and the black spot came into his vision.  Last edited by Morene Rankins, CMA on 04/25/2022  2:36 PM.      Referring physician: Sharion Balloon, [address],  [place] [address]  HISTORICAL INFORMATION:   Selected notes from the MEDICAL RECORD NUMBER    Lab Results  Component Value Date   HGBA1C 6.9 (H) 01/29/2022     CURRENT MEDICATIONS: Current Outpatient Medications (Ophthalmic Drugs)  Medication Sig   azelastine (OPTIVAR) 0.05 % ophthalmic solution Place 1 drop into both eyes 2 (two) times daily.   ofloxacin (OCUFLOX) 0.3 % ophthalmic solution Place 1 drop into the right eye 4 (four) times daily for 21 days.   olopatadine (PATADAY) 0.1 % ophthalmic solution Place 1 drop into both eyes 2 (two) times daily.   prednisoLONE acetate (PRED FORTE) 1 % ophthalmic suspension Place 1 drop into the right eye 4 (four) times daily for 21 days.   No current facility-administered medications for this visit. (Ophthalmic Drugs)   Current Outpatient Medications (Other)  Medication Sig   albuterol (VENTOLIN HFA) 108 (90 Base) MCG/ACT inhaler INHALE 2 PUFFS BY MOUTH EVERY 6 HOURS AS NEEDED FOR WHEEZING FOR SHORTNESS OF BREATH   aspirin EC 81 MG tablet Take 1 tablet (81 mg total) by mouth daily. Swallow whole.   atorvastatin (LIPITOR) 40 MG tablet Take 1 tablet by mouth once daily   baclofen (LIORESAL) 10 MG tablet TAKE 1 TABLET BY MOUTH  THREE TIMES DAILY   blood glucose meter kit and supplies Dispense based on patient and insurance preference. Use up to four times daily as directed. (FOR ICD-10 E10.9, E11.9).   celecoxib (CELEBREX) 200 MG capsule Take 1 capsule by mouth once daily   clopidogrel (PLAVIX) 75 MG tablet Take 1 tablet by mouth once daily   fluticasone (FLONASE) 50 MCG/ACT nasal spray Place 2 sprays into both nostrils daily.   fluticasone-salmeterol (ADVAIR) 100-50 MCG/ACT AEPB Inhale 1 puff into the lungs 2 (two) times daily.   gabapentin (NEURONTIN) 600 MG tablet Take 1 tablet (600 mg total) by mouth 4 (four) times daily as needed.   glucose blood (ACCU-CHEK GUIDE) test strip Use 1 strip up to 4 times daily.  E10.9, E11.9   Insulin Pen Needle (PEN NEEDLES) 32G X 4 MM MISC 1 application by Does not apply route daily.   JARDIANCE 25 MG TABS tablet TAKE 1 TABLET BY MOUTH ONCE DAILY BEFORE BREAKFAST   Lancets (ONETOUCH DELICA PLUS [id number]) MISC Apply 1 each topically 4 (four) times daily.   lisinopril (ZESTRIL) 10 MG tablet Take 1 tablet (10 mg total) by mouth daily.   loratadine (CLARITIN) 10 MG tablet Take 1 tablet (10 mg total) by mouth daily.   metFORMIN (GLUCOPHAGE) 1000 MG tablet Take 1 tablet by mouth twice daily with food   Multiple  Vitamin (MULTIVITAMIN WITH MINERALS) TABS tablet Take 1 tablet by mouth daily.   pantoprazole (PROTONIX) 40 MG tablet Take 1 tablet by mouth once daily   polyethylene glycol powder (GLYCOLAX/MIRALAX) 17 GM/SCOOP powder Take 17 g by mouth 2 (two) times daily as needed.   sildenafil (VIAGRA) 100 MG tablet Take 0.5-1 tablets (50-100 mg total) by mouth daily as needed for erectile dysfunction.   tamsulosin (FLOMAX) 0.4 MG CAPS capsule Take 1 capsule (0.4 mg total) by mouth in the morning and at bedtime.   TRESIBA FLEXTOUCH 100 UNIT/ML FlexTouch Pen INJECT 42 UNITS SUBCUTANEOUSLY ONCE DAILY **DISCONTINUE  LEVEMIR**   TRULICITY 3 HM/0.9OB SOPN INJECT 3 MG SUBCUTANEOUSLY ONCE A WEEK    No current facility-administered medications for this visit. (Other)      REVIEW OF SYSTEMS: ROS   Negative for: Constitutional, Gastrointestinal, Neurological, Skin, Genitourinary, Musculoskeletal, HENT, Endocrine, Cardiovascular, Eyes, Respiratory, Psychiatric, Allergic/Imm, Heme/Lymph Last edited by Orene Desanctis D, CMA on 04/25/2022  2:36 PM.       ALLERGIES No Known Allergies  PAST MEDICAL HISTORY Past Medical History:  Diagnosis Date   Arthritis    BPH (benign prostatic hyperplasia)    Chronic back pain    GERD (gastroesophageal reflux disease)    Hypertension    Mixed hyperlipidemia    Type 2 diabetes mellitus ([place])    Past Surgical History:  Procedure Laterality Date   CATARACT EXTRACTION Right    CATARACT EXTRACTION W/PHACO Left 12/20/2013   Procedure: CATARACT EXTRACTION PHACO AND INTRAOCULAR LENS PLACEMENT ([place]);  Surgeon: Williams Che, MD;  Location: [facility];  Service: Ophthalmology;  Laterality: Left;  CDE: 0.97    FAMILY HISTORY Family History  Problem Relation Age of Onset   Diabetes Mother     SOCIAL HISTORY Social History   Tobacco Use   Smoking status: Never   Smokeless tobacco: Never  Substance Use Topics   Alcohol use: No   Drug use: No         OPHTHALMIC EXAM:  Base Eye Exam     Visual Acuity (ETDRS)       Right Left   Dist [place] 20/100    Dist ph [place] 20/80 20/50         Tonometry (Tonopen, 2:42 PM)       Right Left   Pressure 11 19         Pupils       Pupils   Right PERRL   Left PERRL         Extraocular Movement       Right Left    Ortho Ortho    -- -- --  --  --  -- -- --   -- -- --  --  --  -- -- --           Dilation     Right eye: 2.5% Phenylephrine, 1.0% Mydriacyl @ 2:37 PM           Slit Lamp and Fundus Exam     External Exam       Right Left   External Normal Normal         Slit Lamp Exam       Right Left   Lids/Lashes Normal Normal   Conjunctiva/Sclera White and  quiet White and quiet   Cornea Clear Clear   Anterior Chamber Deep and quiet Deep and quiet   Iris Round and reactive Round and reactive   Lens Centered posterior chamber intraocular lens  Centered posterior chamber intraocular lens   Anterior Vitreous Normal Normal         Fundus Exam       Right Left   Posterior Vitreous Pre-retinal hemorrhage layered inferiorly with, Vitreous hemorrhage strands emanating from the 12 o'clock position at the site of a retinal tear. Pre-retinal hemorrhage layered inferiorly with, Vitreous hemorrhage strands emanating from the 12 o'clock position at the site of a retinal tear.   Disc Normal Normal   C/D Ratio 0.35 0.35   Macula Normal, preretinal vitreous hemorrhage in the visual axis Normal   Vessels Normal Normal   Periphery Peripheral retinal tear much showed obscured by large strand and streaming vitreous and preretinal hemorrhage at 12 o'clock position superiorly.  No other breaks peripherally, Preretinal hemorrhage, Stream of vit heme from tear Normal, no other breaks            IMAGING AND PROCEDURES  Imaging and Procedures for 04/29/22  OCT, Retina - OU - Both Eyes       Right Eye Quality was good. Scan locations included subfoveal. Progression has no prior data. Findings include cystoid macular edema.   Left Eye Quality was good. Scan locations included subfoveal. Progression has no prior data. Findings include cystoid macular edema.   Notes Bilateral CSME.      Color Fundus Photography Optos - OU - Both Eyes       Right Eye Progression has no prior data. Macula : normal observations. Vessels : normal observations.   Left Eye Progression has no prior data. Disc findings include normal observations. Macula : normal observations. Vessels : normal observations. Periphery : normal observations.   Notes Central vitreous hemorrhage and a stream of vitreous hemorrhage from the superior margin at near 12:00 accumulated preretinal  hemorrhage inferiorly, OD              ASSESSMENT/PLAN:  Retinal hole or tear, right OD with large streaming vitreous and preretinal hemorrhage from a emanating from the retinal tear with vitreal traction continuing at 12 o'clock position.  We will recommend laser retinopexy around this region  I explained to the patient that laser repair of the retinal tear will not clear the hemorrhage that the hemorrhage will clear spontaneously over time usually weeks but if not surgical intervention could be undertaken if he needs improvement in acuity before that.  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) OU with perifoveal CSME minor by OCT we will continue to monitor and observe     ICD-10-CM   1. Retinal hole or tear, right  H33.301 Color Fundus Photography Optos - OU - Both Eyes    2. Vitreous hemorrhage, right eye (HCC)  H43.11 Color Fundus Photography Optos - OU - Both Eyes    3. 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 Severe nonproliferative diabetic retinopathy of both eyes with macular edema associated with type 2 diabetes mellitus ([place])  V89.3810       1.  OD, prepared to deliver laser retinopexy today however needs precertification with patient's insurance.  We will schedule for prompt evaluation and follow-up next clinic day.  2.  3.  Ophthalmic Meds Ordered this visit:  No orders of the defined types were placed in this encounter.      No follow-ups on file.  There are no  Patient Instructions on file for this visit.   Explained the diagnoses, plan, and follow up with the patient and they expressed understanding.  Patient expressed understanding of the importance of proper follow up care.   Clent Demark Ifeoluwa Bartz M.D. Diseases & Surgery of the Retina and Vitreous Retina &  Diabetic [place] 04/29/22     Abbreviations: M myopia (nearsighted); A astigmatism; H hyperopia (farsighted); P presbyopia; Mrx spectacle prescription;  CTL contact lenses; OD right eye; OS left eye; OU both eyes  XT exotropia; ET esotropia; PEK punctate epithelial keratitis; PEE punctate epithelial erosions; DES dry eye syndrome; MGD meibomian gland dysfunction; ATs artificial tears; PFAT's preservative free artificial tears; [place] nuclear sclerotic cataract; PSC posterior subcapsular cataract; ERM epi-retinal membrane; PVD posterior vitreous detachment; RD retinal detachment; DM diabetes mellitus; DR diabetic retinopathy; NPDR non-proliferative diabetic retinopathy; PDR proliferative diabetic retinopathy; CSME clinically significant macular edema; DME diabetic macular edema; dbh dot blot hemorrhages; CWS cotton wool spot; POAG primary open angle glaucoma; C/D cup-to-disc ratio; HVF humphrey visual field; GVF goldmann visual field; OCT optical coherence tomography; IOP intraocular pressure; BRVO Branch retinal vein occlusion; CRVO central retinal vein occlusion; CRAO central retinal artery occlusion; BRAO branch retinal artery occlusion; RT retinal tear; SB scleral buckle; PPV pars plana vitrectomy; VH Vitreous hemorrhage; PRP panretinal laser photocoagulation; IVK intravitreal kenalog; VMT vitreomacular traction; MH Macular hole;  NVD neovascularization of the disc; NVE neovascularization elsewhere; AREDS age related eye disease study; ARMD age related macular degeneration; POAG primary open angle glaucoma; EBMD epithelial/anterior basement membrane dystrophy; ACIOL anterior chamber intraocular lens; IOL intraocular lens; PCIOL posterior chamber intraocular lens; Phaco/IOL phacoemulsification with intraocular lens placement; [place] photorefractive keratectomy; LASIK laser assisted in situ keratomileusis; HTN hypertension; DM diabetes mellitus; COPD chronic obstructive pulmonary disease

## 2022-04-30 ENCOUNTER — Telehealth: Payer: Self-pay

## 2022-04-30 NOTE — Telephone Encounter (Signed)
Armend Paugh (Key: [id number]) Rx #: [id number] Tyler Aas FlexTouch (insulin degludec injection) 100 Units/mL solution   Form Charity fundraiser PA Form (2017 NCPDP) Created 4 days ago Sent to Plan 7 minutes ago Plan Response 7 minutes ago Submit Clinical Questions less than a minute ago Determination Wait for Determination Please wait for [employer] NCPDP 2017 to return a determination.

## 2022-04-30 NOTE — Telephone Encounter (Signed)
This request has received a denial. View the bottom of the request for an electronic copy of the denial letter with a list of denial reasons from the plan.    After reading the electronic denial letter, you may choose to complete an appeal. View the bottom of the request to see if an eAppeal is available.

## 2022-05-01 ENCOUNTER — Telehealth: Payer: Self-pay | Admitting: Family Medicine

## 2022-05-01 ENCOUNTER — Encounter (HOSPITAL_BASED_OUTPATIENT_CLINIC_OR_DEPARTMENT_OTHER): Payer: 59 | Admitting: Ophthalmology

## 2022-05-01 DIAGNOSIS — H4311 Vitreous hemorrhage, right eye: Secondary | ICD-10-CM | POA: Diagnosis not present

## 2022-05-01 DIAGNOSIS — H4312 Vitreous hemorrhage, left eye: Secondary | ICD-10-CM | POA: Diagnosis not present

## 2022-05-01 DIAGNOSIS — H33312 Horseshoe tear of retina without detachment, left eye: Secondary | ICD-10-CM | POA: Diagnosis not present

## 2022-05-01 DIAGNOSIS — H33311 Horseshoe tear of retina without detachment, right eye: Secondary | ICD-10-CM | POA: Diagnosis not present

## 2022-05-01 NOTE — Telephone Encounter (Signed)
Your PA has been faxed to the plan as a paper copy. Please contact the plan directly if you haven't received a determination in a typical timeframe.  You will be notified of the determination via fax. 

## 2022-05-02 ENCOUNTER — Encounter (INDEPENDENT_AMBULATORY_CARE_PROVIDER_SITE_OTHER): Payer: Self-pay | Admitting: Ophthalmology

## 2022-05-02 ENCOUNTER — Ambulatory Visit: Payer: 59 | Admitting: Family

## 2022-05-02 ENCOUNTER — Ambulatory Visit (INDEPENDENT_AMBULATORY_CARE_PROVIDER_SITE_OTHER): Payer: 59 | Admitting: Ophthalmology

## 2022-05-02 ENCOUNTER — Encounter (INDEPENDENT_AMBULATORY_CARE_PROVIDER_SITE_OTHER): Payer: Self-pay

## 2022-05-02 DIAGNOSIS — H33301 Unspecified retinal break, right eye: Secondary | ICD-10-CM

## 2022-05-02 DIAGNOSIS — H4311 Vitreous hemorrhage, right eye: Secondary | ICD-10-CM

## 2022-05-02 DIAGNOSIS — E113413 Type 2 diabetes mellitus with severe nonproliferative diabetic retinopathy with macular edema, bilateral: Secondary | ICD-10-CM

## 2022-05-02 MED ORDER — BASAGLAR KWIKPEN 100 UNIT/ML ~~LOC~~ SOPN: 42.0000 [IU] | PEN_INJECTOR | Freq: Every day | SUBCUTANEOUS | 5 refills | Status: DC

## 2022-05-02 NOTE — Patient Instructions (Signed)

## 2022-05-02 NOTE — Telephone Encounter (Signed)
Basaglar called in to [place] Patient made aware of transition Insurance will only cover levemir and basaglar Patient verbalized understanding

## 2022-05-02 NOTE — Addendum Note (Signed)
Addended by: Lottie Dawson D on: 05/02/2022 03:10 PM   Modules accepted: Orders

## 2022-05-02 NOTE — Assessment & Plan Note (Signed)
Vastly improved OD postop day #1 for idiopathic vitreous hemorrhages thought to be related to retinal tear superiorly.  Clear vision some residual haze today

## 2022-05-02 NOTE — Telephone Encounter (Signed)
Jimmy Craig can you help me. [employer] says its not covered.

## 2022-05-02 NOTE — Assessment & Plan Note (Signed)
Stable OD °

## 2022-05-02 NOTE — Progress Notes (Signed)
05/02/2022     CHIEF COMPLAINT Patient presents for  Chief Complaint  Patient presents with   Post-op Follow-up      HISTORY OF PRESENT ILLNESS: Jimmy Craig is a 71 y.o. male who presents to the clinic today for:   HPI   Post op 1 day post op sx 05/01/22, vit, focal laser OD Found to have small tear, or spontaneous retinal hemorrhage anterior retina at 12 meridian.  Retina was attached.  No macroaneurysm seen.   Pt states he is feeling well after surgery Pt states his vision is a little cloudy but he can see Pt admits to small blue floater in his right eye when he looks down Pt denies FOL Last edited by Hurman Horn, MD on 05/02/2022  9:28 AM.      Referring physician: Sharion Balloon, [address] [place],  [place] [address]  HISTORICAL INFORMATION:   Selected notes from the MEDICAL RECORD NUMBER    Lab Results  Component Value Date   HGBA1C 6.9 (H) 01/29/2022     CURRENT MEDICATIONS: Current Outpatient Medications (Ophthalmic Drugs)  Medication Sig   azelastine (OPTIVAR) 0.05 % ophthalmic solution Place 1 drop into both eyes 2 (two) times daily.   ofloxacin (OCUFLOX) 0.3 % ophthalmic solution Place 1 drop into the right eye 4 (four) times daily for 21 days.   olopatadine (PATADAY) 0.1 % ophthalmic solution Place 1 drop into both eyes 2 (two) times daily.   prednisoLONE acetate (PRED FORTE) 1 % ophthalmic suspension Place 1 drop into the right eye 4 (four) times daily for 21 days.   No current facility-administered medications for this visit. (Ophthalmic Drugs)   Current Outpatient Medications (Other)  Medication Sig   albuterol (VENTOLIN HFA) 108 (90 Base) MCG/ACT inhaler INHALE 2 PUFFS BY MOUTH EVERY 6 HOURS AS NEEDED FOR WHEEZING FOR SHORTNESS OF BREATH   aspirin EC 81 MG tablet Take 1 tablet (81 mg total) by mouth daily. Swallow whole.   atorvastatin (LIPITOR) 40 MG tablet Take 1 tablet by mouth once daily   baclofen (LIORESAL) 10 MG tablet  TAKE 1 TABLET BY MOUTH THREE TIMES DAILY   blood glucose meter kit and supplies Dispense based on patient and insurance preference. Use up to four times daily as directed. (FOR ICD-10 E10.9, E11.9).   celecoxib (CELEBREX) 200 MG capsule Take 1 capsule by mouth once daily   clopidogrel (PLAVIX) 75 MG tablet Take 1 tablet by mouth once daily   fluticasone (FLONASE) 50 MCG/ACT nasal spray Place 2 sprays into both nostrils daily.   fluticasone-salmeterol (ADVAIR) 100-50 MCG/ACT AEPB Inhale 1 puff into the lungs 2 (two) times daily.   gabapentin (NEURONTIN) 600 MG tablet Take 1 tablet (600 mg total) by mouth 4 (four) times daily as needed.   glucose blood (ACCU-CHEK GUIDE) test strip Use 1 strip up to 4 times daily.  E10.9, E11.9   Insulin Pen Needle (PEN NEEDLES) 32G X 4 MM MISC 1 application by Does not apply route daily.   JARDIANCE 25 MG TABS tablet TAKE 1 TABLET BY MOUTH ONCE DAILY BEFORE BREAKFAST   Lancets (ONETOUCH DELICA PLUS [id number]) MISC Apply 1 each topically 4 (four) times daily.   lisinopril (ZESTRIL) 10 MG tablet Take 1 tablet (10 mg total) by mouth daily.   loratadine (CLARITIN) 10 MG tablet Take 1 tablet (10 mg total) by mouth daily.   metFORMIN (GLUCOPHAGE) 1000 MG tablet Take 1 tablet by mouth twice daily with food  Multiple Vitamin (MULTIVITAMIN WITH MINERALS) TABS tablet Take 1 tablet by mouth daily.   pantoprazole (PROTONIX) 40 MG tablet Take 1 tablet by mouth once daily   polyethylene glycol powder (GLYCOLAX/MIRALAX) 17 GM/SCOOP powder Take 17 g by mouth 2 (two) times daily as needed.   sildenafil (VIAGRA) 100 MG tablet Take 0.5-1 tablets (50-100 mg total) by mouth daily as needed for erectile dysfunction.   tamsulosin (FLOMAX) 0.4 MG CAPS capsule Take 1 capsule (0.4 mg total) by mouth in the morning and at bedtime.   TRESIBA FLEXTOUCH 100 UNIT/ML FlexTouch Pen INJECT 42 UNITS SUBCUTANEOUSLY ONCE DAILY **DISCONTINUE  LEVEMIR**   TRULICITY 3 JG/8.1LX SOPN INJECT 3 MG  SUBCUTANEOUSLY ONCE A WEEK   No current facility-administered medications for this visit. (Other)      REVIEW OF SYSTEMS: ROS   Negative for: Constitutional, Gastrointestinal, Neurological, Skin, Genitourinary, Musculoskeletal, HENT, Endocrine, Cardiovascular, Eyes, Respiratory, Psychiatric, Allergic/Imm, Heme/Lymph Last edited by Orene Desanctis D, CMA on 05/02/2022  9:17 AM.       ALLERGIES No Known Allergies  PAST MEDICAL HISTORY Past Medical History:  Diagnosis Date   Arthritis    BPH (benign prostatic hyperplasia)    Chronic back pain    GERD (gastroesophageal reflux disease)    Hypertension    Mixed hyperlipidemia    Type 2 diabetes mellitus ([place])    Past Surgical History:  Procedure Laterality Date   CATARACT EXTRACTION Right    CATARACT EXTRACTION W/PHACO Left 12/20/2013   Procedure: CATARACT EXTRACTION PHACO AND INTRAOCULAR LENS PLACEMENT ([place]);  Surgeon: Williams Che, MD;  Location: [facility];  Service: Ophthalmology;  Laterality: Left;  CDE: 0.97    FAMILY HISTORY Family History  Problem Relation Age of Onset   Diabetes Mother     SOCIAL HISTORY Social History   Tobacco Use   Smoking status: Never   Smokeless tobacco: Never  Substance Use Topics   Alcohol use: No   Drug use: No         OPHTHALMIC EXAM:  Base Eye Exam     Visual Acuity (ETDRS)       Right Left   Dist [place] 20/100 +2 20/60   Dist ph [place]  20/40 +2         Tonometry (Tonopen, 9:21 AM)       Right Left   Pressure 6 5         Pupils       Pupils   Right PERRL   Left PERRL         Visual Fields       Left Right    Full Full         Extraocular Movement       Right Left    Ortho Ortho    -- -- --  --  --  -- -- --   -- -- --  --  --  -- -- --           Neuro/Psych     Oriented x3: Yes         Dilation     Right eye: 2.5% Phenylephrine, 1.0% Mydriacyl @ 9:18 AM           Slit Lamp and Fundus Exam     External Exam       Right  Left   External Normal Normal         Slit Lamp Exam       Right Left   Lids/Lashes Normal  Normal   Conjunctiva/Sclera White and quiet White and quiet   Cornea Clear Clear   Anterior Chamber Trace Cell, RBC Deep and quiet   Iris Round and reactive Round and reactive   Lens Centered posterior chamber intraocular lens Centered posterior chamber intraocular lens   Anterior Vitreous Trace, RBC cell Normal         Fundus Exam       Right Left   Posterior Vitreous Mild vent haze    Disc Normal    C/D Ratio 0.2    Macula Normal    Vessels No details    Periphery Good retinopexy superiorly.             IMAGING AND PROCEDURES  Imaging and Procedures for 05/02/22           ASSESSMENT/PLAN:  Vitreous hemorrhage, right eye (HCC) Vastly improved OD postop day #1 for idiopathic vitreous hemorrhages thought to be related to retinal tear superiorly.  Clear vision some residual haze today    Retinal hole or tear, right Stable OD     ICD-10-CM   1. Vitreous hemorrhage, right eye (HCC)  H43.11     2. Severe nonproliferative diabetic retinopathy of both eyes with macular edema associated with type 2 diabetes mellitus ([place])  H41.7408     3. Retinal hole or tear, right  H33.301       1.  OD much improved today.  Good retinopexy, mild vitreous haze accounts for acuity.  Likely continue to clear.  Rather dense vitreous hemorrhage found at the time of surgical intervention.  This is now cleared.  2.  Limitations of activity reviewed with the patient.  Patient understands can restart the topical medications to the right eye  3.  Ophthalmic Meds Ordered this visit:  No orders of the defined types were placed in this encounter.      Return in about 5 days (around 05/07/2022) for dilate, OD, COLOR FP, OCT.  Patient Instructions  Ofloxacin  4 times daily to the operative eye  Prednisolone acetate 1 drop to the operative eye 4 times daily  Patient instructed not to  refill the medications and use them for maximum of 3 weeks.  Patient instructed do not rub the eye.  Patient has the option to use the patch at night.  No lifting and bending for 1 week. No water IN the eye for 10 days. Do not rub the eye. Wear shield at night for 1-3 days.  Continue your topical medications for a total of 3 weeks.  Do not refill your postoperative medications unless instructed.  Refrain from exercise or intentional activity which increases our heart rate above resting levels.  Normal walking to complete normal activities of your day are appropriate.  Driving:  Legally, you only need one good eye, of 20/40 or better to drive.  However, the practice does not recommend driving during first weeks after surgery, IF you are uncomfortable with your visual functioning or capabilities.   If you have known sleep apnea, wear your CPAP as you normally should.     Explained the diagnoses, plan, and follow up with the patient and they expressed understanding.  Patient expressed understanding of the importance of proper follow up care.   Clent Demark Merrissa Giacobbe M.D. Diseases & Surgery of the Retina and Vitreous Retina & Diabetic [place] 05/02/22     Abbreviations: M myopia (nearsighted); A astigmatism; H hyperopia (farsighted); P presbyopia; Mrx spectacle prescription;  CTL contact lenses; OD right  eye; OS left eye; OU both eyes  XT exotropia; ET esotropia; PEK punctate epithelial keratitis; PEE punctate epithelial erosions; DES dry eye syndrome; MGD meibomian gland dysfunction; ATs artificial tears; PFAT's preservative free artificial tears; [place] nuclear sclerotic cataract; PSC posterior subcapsular cataract; ERM epi-retinal membrane; PVD posterior vitreous detachment; RD retinal detachment; DM diabetes mellitus; DR diabetic retinopathy; NPDR non-proliferative diabetic retinopathy; PDR proliferative diabetic retinopathy; CSME clinically significant macular edema; DME diabetic macular edema; dbh  dot blot hemorrhages; CWS cotton wool spot; POAG primary open angle glaucoma; C/D cup-to-disc ratio; HVF humphrey visual field; GVF goldmann visual field; OCT optical coherence tomography; IOP intraocular pressure; BRVO Branch retinal vein occlusion; CRVO central retinal vein occlusion; CRAO central retinal artery occlusion; BRAO branch retinal artery occlusion; RT retinal tear; SB scleral buckle; PPV pars plana vitrectomy; VH Vitreous hemorrhage; PRP panretinal laser photocoagulation; IVK intravitreal kenalog; VMT vitreomacular traction; MH Macular hole;  NVD neovascularization of the disc; NVE neovascularization elsewhere; AREDS age related eye disease study; ARMD age related macular degeneration; POAG primary open angle glaucoma; EBMD epithelial/anterior basement membrane dystrophy; ACIOL anterior chamber intraocular lens; IOL intraocular lens; PCIOL posterior chamber intraocular lens; Phaco/IOL phacoemulsification with intraocular lens placement; [place] photorefractive keratectomy; LASIK laser assisted in situ keratomileusis; HTN hypertension; DM diabetes mellitus; COPD chronic obstructive pulmonary disease

## 2022-05-08 ENCOUNTER — Ambulatory Visit (INDEPENDENT_AMBULATORY_CARE_PROVIDER_SITE_OTHER): Payer: 59 | Admitting: Ophthalmology

## 2022-05-08 ENCOUNTER — Encounter (INDEPENDENT_AMBULATORY_CARE_PROVIDER_SITE_OTHER): Payer: Self-pay

## 2022-05-08 ENCOUNTER — Encounter (INDEPENDENT_AMBULATORY_CARE_PROVIDER_SITE_OTHER): Payer: 59 | Admitting: Ophthalmology

## 2022-05-08 ENCOUNTER — Encounter (INDEPENDENT_AMBULATORY_CARE_PROVIDER_SITE_OTHER): Payer: Self-pay | Admitting: Ophthalmology

## 2022-05-08 DIAGNOSIS — H4311 Vitreous hemorrhage, right eye: Secondary | ICD-10-CM

## 2022-05-08 DIAGNOSIS — H33311 Horseshoe tear of retina without detachment, right eye: Secondary | ICD-10-CM

## 2022-05-08 DIAGNOSIS — E113413 Type 2 diabetes mellitus with severe nonproliferative diabetic retinopathy with macular edema, bilateral: Secondary | ICD-10-CM

## 2022-05-08 NOTE — Assessment & Plan Note (Signed)
Minor OU.  We will continue to observe

## 2022-05-08 NOTE — Assessment & Plan Note (Signed)
Cleared 1 week post vitrectomy removal of hemorrhage and cautery of the laser cautery of retinal break spontaneous hemorrhage

## 2022-05-08 NOTE — Patient Instructions (Signed)

## 2022-05-08 NOTE — Progress Notes (Signed)
  05/08/2022     CHIEF COMPLAINT Patient presents for  Chief Complaint  Patient presents with   Post-op Follow-up      HISTORY OF PRESENT ILLNESS: Jimmy Craig is a 71 y.o. male who presents to the clinic today for:   HPI     Post-op Follow-up           Laterality: right eye         Comments   6 DAYS FOR DILATE OD, COLOR FP, OCT, POST OP., vit focal laser for break, with hemorrhage OD,  Pt stated vision has improved since last visit.  Pt is continuing post eye drops.       Last edited by Rankin, Gary A, MD on 05/08/2022 11:01 AM.      Referring physician: Hawks, Christy A, FNP [address] [place],  [place] [address]  HISTORICAL INFORMATION:   Selected notes from the medical record:     Lab Results  Component Value Date   HGBA1C 6.9 (H) 01/29/2022     CURRENT MEDICATIONS: Current Outpatient Medications (Ophthalmic Drugs)  Medication Sig   azelastine (OPTIVAR) 0.05 % ophthalmic solution Place 1 drop into both eyes 2 (two) times daily.   ofloxacin (OCUFLOX) 0.3 % ophthalmic solution Place 1 drop into the right eye 4 (four) times daily for 21 days.   olopatadine (PATADAY) 0.1 % ophthalmic solution Place 1 drop into both eyes 2 (two) times daily.   prednisoLONE acetate (PRED FORTE) 1 % ophthalmic suspension Place 1 drop into the right eye 4 (four) times daily for 21 days.   No current facility-administered medications for this visit. (Ophthalmic Drugs)   Current Outpatient Medications (Other)  Medication Sig   albuterol (VENTOLIN HFA) 108 (90 Base) MCG/ACT inhaler INHALE 2 PUFFS BY MOUTH EVERY 6 HOURS AS NEEDED FOR WHEEZING FOR SHORTNESS OF BREATH   aspirin EC 81 MG tablet Take 1 tablet (81 mg total) by mouth daily. Swallow whole.   atorvastatin (LIPITOR) 40 MG tablet Take 1 tablet by mouth once daily   baclofen (LIORESAL) 10 MG tablet TAKE 1 TABLET BY MOUTH THREE TIMES DAILY   blood glucose meter kit and supplies Dispense based on patient and  insurance preference. Use up to four times daily as directed. (FOR ICD-10 E10.9, E11.9).   celecoxib (CELEBREX) 200 MG capsule Take 1 capsule by mouth once daily   clopidogrel (PLAVIX) 75 MG tablet Take 1 tablet by mouth once daily   fluticasone (FLONASE) 50 MCG/ACT nasal spray Place 2 sprays into both nostrils daily.   fluticasone-salmeterol (ADVAIR) 100-50 MCG/ACT AEPB Inhale 1 puff into the lungs 2 (two) times daily.   gabapentin (NEURONTIN) 600 MG tablet Take 1 tablet (600 mg total) by mouth 4 (four) times daily as needed.   glucose blood (ACCU-CHEK GUIDE) test strip Use 1 strip up to 4 times daily.  E10.9, E11.9   Insulin Glargine (BASAGLAR KWIKPEN) 100 UNIT/ML Inject 42 Units into the skin daily.   Insulin Pen Needle (PEN NEEDLES) 32G X 4 MM MISC 1 application by Does not apply route daily.   JARDIANCE 25 MG TABS tablet TAKE 1 TABLET BY MOUTH ONCE DAILY BEFORE BREAKFAST   Lancets (ONETOUCH DELICA PLUS LANCET33G) MISC Apply 1 each topically 4 (four) times daily.   lisinopril (ZESTRIL) 10 MG tablet Take 1 tablet (10 mg total) by mouth daily.   loratadine (CLARITIN) 10 MG tablet Take 1 tablet (10 mg total) by mouth daily.   metFORMIN (GLUCOPHAGE) 1000 MG tablet Take   1 tablet by mouth twice daily with food   Multiple Vitamin (MULTIVITAMIN WITH MINERALS) TABS tablet Take 1 tablet by mouth daily.   pantoprazole (PROTONIX) 40 MG tablet Take 1 tablet by mouth once daily   polyethylene glycol powder (GLYCOLAX/MIRALAX) 17 GM/SCOOP powder Take 17 g by mouth 2 (two) times daily as needed.   sildenafil (VIAGRA) 100 MG tablet Take 0.5-1 tablets (50-100 mg total) by mouth daily as needed for erectile dysfunction.   tamsulosin (FLOMAX) 0.4 MG CAPS capsule Take 1 capsule (0.4 mg total) by mouth in the morning and at bedtime.   TRULICITY 3 XB/2.8UX SOPN INJECT 3 MG SUBCUTANEOUSLY ONCE A WEEK   No current facility-administered medications for this visit. (Other)      REVIEW OF SYSTEMS: ROS   Negative  for: Constitutional, Gastrointestinal, Neurological, Skin, Genitourinary, Musculoskeletal, HENT, Endocrine, Cardiovascular, Eyes, Respiratory, Psychiatric, Allergic/Imm, Heme/Lymph Last edited by Silvestre Moment on 05/08/2022 10:12 AM.       ALLERGIES No Known Allergies  PAST MEDICAL HISTORY Past Medical History:  Diagnosis Date   Arthritis    BPH (benign prostatic hyperplasia)    Chronic back pain    GERD (gastroesophageal reflux disease)    Hypertension    Mixed hyperlipidemia    Type 2 diabetes mellitus ([place])    Past Surgical History:  Procedure Laterality Date   CATARACT EXTRACTION Right    CATARACT EXTRACTION W/PHACO Left 12/20/2013   Procedure: CATARACT EXTRACTION PHACO AND INTRAOCULAR LENS PLACEMENT ([place]);  Surgeon: Williams Che, MD;  Location: [facility];  Service: Ophthalmology;  Laterality: Left;  CDE: 0.97    FAMILY HISTORY Family History  Problem Relation Age of Onset   Diabetes Mother     SOCIAL HISTORY Social History   Tobacco Use   Smoking status: Never   Smokeless tobacco: Never  Substance Use Topics   Alcohol use: No   Drug use: No         OPHTHALMIC EXAM:  Base Eye Exam     Visual Acuity (ETDRS)       Right Left   Dist [place] 20/40 -2 +2 20/80 -2   Dist ph [place] NI 20/30         Tonometry (Tonopen, 10:18 AM)       Right Left   Pressure 12 13         Pupils       Pupils APD   Right PERRL None   Left PERRL None         Visual Fields       Left Right    Full Full         Extraocular Movement       Right Left    Full, Ortho Full, Ortho         Neuro/Psych     Oriented x3: Yes         Dilation     Right eye: 2.5% Phenylephrine, 1.0% Mydriacyl @ 10:18 AM           Slit Lamp and Fundus Exam     External Exam       Right Left   External Normal Normal         Slit Lamp Exam       Right Left   Lids/Lashes Normal Normal   Conjunctiva/Sclera White and quiet White and quiet   Cornea Clear Clear   Anterior  Chamber Trace Cell, RBC Deep and quiet   Iris Round and reactive Round and  reactive   Lens Centered posterior chamber intraocular lens Centered posterior chamber intraocular lens   Anterior Vitreous Clear and avitric Normal         Fundus Exam       Right Left   Posterior Vitreous Clear and avitric    Disc Normal    C/D Ratio 0.2    Macula Normal    Vessels No details    Periphery Good retinopexy superiorly. Site of spontaneous heme, no new blood, 12, near equator             IMAGING AND PROCEDURES  Imaging and Procedures for 05/08/22  OCT, Retina - OU - Both Eyes       Right Eye Quality was good. Scan locations included subfoveal. Central Foveal Thickness: 277. Progression has been stable. Findings include cystoid macular edema.   Left Eye Quality was good. Scan locations included subfoveal. Central Foveal Thickness: 294. Progression has been stable. Findings include cystoid macular edema.   Notes Bilateral CSME.  Minor overall.      Color Fundus Photography Optos - OU - Both Eyes       Right Eye Progression has no prior data. Macula : normal observations. Vessels : normal observations.   Left Eye Progression has no prior data. Disc findings include normal observations. Macula : normal observations. Vessels : normal observations. Periphery : normal observations.   Notes No sign of active bleeding.  Good retinopexy superiorly.              ASSESSMENT/PLAN:  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) Minor OU.  We will continue to observe  Vitreous hemorrhage, right eye (HCC) Cleared 1 week post vitrectomy removal of hemorrhage and cautery of the laser cautery of retinal break spontaneous hemorrhage     ICD-10-CM   1. Vitreous hemorrhage, right eye (HCC)  H43.11 OCT, Retina - OU - Both Eyes    Color Fundus Photography Optos - OU - Both Eyes    2. Severe nonproliferative diabetic retinopathy of both eyes  with macular edema associated with type 2 diabetes mellitus (HCC)  E11.3413       1.  OD looks great.  Hemorrhage is cleared.  Visual acuity has improved.  2.  Patient may resume a light duty work today.  3.  Ophthalmic Meds Ordered this visit:  No orders of the defined types were placed in this encounter.      Return in about 3 months (around 08/08/2022) for DILATE OU, OCT.  Patient Instructions  Ofloxacin  4 times daily to the operative eye  Prednisolone acetate 1 drop to the operative eye 4 times daily  Patient instructed not to refill the medications and use them for maximum of 3 weeks.  Patient instructed do not rub the eye.  Patient has the option to use the patch at night.   No lifting and bending for 1 week. No water IN the eye for 10 days. Do not rub the eye. Wear shield at night for 1-3 days.  Continue your topical medications for a total of 3 weeks.  Do not refill your postoperative medications unless instructed.  Refrain from exercise or intentional activity which increases our heart rate above resting levels.  Normal walking to complete normal activities of your day are appropriate.  Driving:  Legally, you only need one good eye, of 20/40 or better to drive.  However, the practice does not recommend driving during first weeks after surgery, IF you are   uncomfortable with your visual functioning or capabilities.   If you have known sleep apnea, wear your CPAP as you normally should.    Explained the diagnoses, plan, and follow up with the patient and they expressed understanding.  Patient expressed understanding of the importance of proper follow up care.   Gary A. Rankin M.D. Diseases & Surgery of the Retina and Vitreous [facility] 05/08/22     Abbreviations: M myopia (nearsighted); A astigmatism; H hyperopia (farsighted); P presbyopia; Mrx spectacle prescription;  CTL contact lenses; OD right eye; OS left eye; OU both eyes  XT exotropia; ET  esotropia; PEK punctate epithelial keratitis; PEE punctate epithelial erosions; DES dry eye syndrome; MGD meibomian gland dysfunction; ATs artificial tears; PFAT's preservative free artificial tears; NSC nuclear sclerotic cataract; PSC posterior subcapsular cataract; ERM epi-retinal membrane; PVD posterior vitreous detachment; RD retinal detachment; DM diabetes mellitus; DR diabetic retinopathy; NPDR non-proliferative diabetic retinopathy; PDR proliferative diabetic retinopathy; CSME clinically significant macular edema; DME diabetic macular edema; dbh dot blot hemorrhages; CWS cotton wool spot; POAG primary open angle glaucoma; C/D cup-to-disc ratio; HVF humphrey visual field; GVF goldmann visual field; OCT optical coherence tomography; IOP intraocular pressure; BRVO Branch retinal vein occlusion; CRVO central retinal vein occlusion; CRAO central retinal artery occlusion; BRAO branch retinal artery occlusion; RT retinal tear; SB scleral buckle; PPV pars plana vitrectomy; VH Vitreous hemorrhage; PRP panretinal laser photocoagulation; IVK intravitreal kenalog; VMT vitreomacular traction; MH Macular hole;  NVD neovascularization of the disc; NVE neovascularization elsewhere; AREDS age related eye disease study; ARMD age related macular degeneration; POAG primary open angle glaucoma; EBMD epithelial/anterior basement membrane dystrophy; ACIOL anterior chamber intraocular lens; IOL intraocular lens; PCIOL posterior chamber intraocular lens; Phaco/IOL phacoemulsification with intraocular lens placement; PRK photorefractive keratectomy; LASIK laser assisted in situ keratomileusis; HTN hypertension; DM diabetes mellitus; COPD chronic obstructive pulmonary disease 

## 2022-05-13 ENCOUNTER — Ambulatory Visit (INDEPENDENT_AMBULATORY_CARE_PROVIDER_SITE_OTHER): Payer: 59 | Admitting: Family

## 2022-05-13 ENCOUNTER — Encounter: Payer: Self-pay | Admitting: Family

## 2022-05-13 VITALS — BP 119/68 | HR 88 | Temp 97.4°F | Ht 65.0 in | Wt 137.8 lb

## 2022-05-13 DIAGNOSIS — E1159 Type 2 diabetes mellitus with other circulatory complications: Secondary | ICD-10-CM

## 2022-05-13 DIAGNOSIS — E785 Hyperlipidemia, unspecified: Secondary | ICD-10-CM

## 2022-05-13 DIAGNOSIS — K5900 Constipation, unspecified: Secondary | ICD-10-CM

## 2022-05-13 DIAGNOSIS — E1142 Type 2 diabetes mellitus with diabetic polyneuropathy: Secondary | ICD-10-CM

## 2022-05-13 DIAGNOSIS — D509 Iron deficiency anemia, unspecified: Secondary | ICD-10-CM | POA: Diagnosis not present

## 2022-05-13 DIAGNOSIS — J449 Chronic obstructive pulmonary disease, unspecified: Secondary | ICD-10-CM | POA: Diagnosis not present

## 2022-05-13 DIAGNOSIS — R3916 Straining to void: Secondary | ICD-10-CM

## 2022-05-13 DIAGNOSIS — N401 Enlarged prostate with lower urinary tract symptoms: Secondary | ICD-10-CM | POA: Diagnosis not present

## 2022-05-13 DIAGNOSIS — N529 Male erectile dysfunction, unspecified: Secondary | ICD-10-CM

## 2022-05-13 DIAGNOSIS — M150 Primary generalized (osteo)arthritis: Secondary | ICD-10-CM

## 2022-05-13 DIAGNOSIS — G629 Polyneuropathy, unspecified: Secondary | ICD-10-CM | POA: Diagnosis not present

## 2022-05-13 DIAGNOSIS — M5442 Lumbago with sciatica, left side: Secondary | ICD-10-CM

## 2022-05-13 DIAGNOSIS — K219 Gastro-esophageal reflux disease without esophagitis: Secondary | ICD-10-CM

## 2022-05-13 DIAGNOSIS — E1169 Type 2 diabetes mellitus with other specified complication: Secondary | ICD-10-CM

## 2022-05-13 DIAGNOSIS — M159 Polyosteoarthritis, unspecified: Secondary | ICD-10-CM | POA: Diagnosis not present

## 2022-05-13 DIAGNOSIS — Z794 Long term (current) use of insulin: Secondary | ICD-10-CM | POA: Diagnosis not present

## 2022-05-13 DIAGNOSIS — G8929 Other chronic pain: Secondary | ICD-10-CM

## 2022-05-13 DIAGNOSIS — D649 Anemia, unspecified: Secondary | ICD-10-CM | POA: Diagnosis not present

## 2022-05-13 DIAGNOSIS — Z23 Encounter for immunization: Secondary | ICD-10-CM | POA: Diagnosis not present

## 2022-05-13 DIAGNOSIS — J301 Allergic rhinitis due to pollen: Secondary | ICD-10-CM

## 2022-05-13 DIAGNOSIS — I152 Hypertension secondary to endocrine disorders: Secondary | ICD-10-CM

## 2022-05-13 LAB — BAYER DCA HB A1C WAIVED: HB A1C (BAYER DCA - WAIVED): 7.3 % — ABNORMAL HIGH (ref 4.8–5.6)

## 2022-05-13 MED ORDER — LISINOPRIL 10 MG PO TABS: 10.0000 mg | ORAL_TABLET | Freq: Every day | ORAL | 3 refills | Status: DC

## 2022-05-13 MED ORDER — METFORMIN HCL 1000 MG PO TABS: 1000.0000 mg | ORAL_TABLET | Freq: Two times a day (BID) | ORAL | 1 refills | Status: DC

## 2022-05-13 MED ORDER — FLUTICASONE PROPIONATE 50 MCG/ACT NA SUSP: 2.0000 | Freq: Every day | NASAL | 6 refills | Status: DC

## 2022-05-13 MED ORDER — FLUTICASONE-SALMETEROL 100-50 MCG/ACT IN AEPB: 1.0000 | INHALATION_SPRAY | Freq: Two times a day (BID) | RESPIRATORY_TRACT | 3 refills | Status: DC

## 2022-05-13 MED ORDER — TADALAFIL 20 MG PO TABS: 10.0000 mg | ORAL_TABLET | ORAL | 11 refills | Status: DC | PRN

## 2022-05-13 MED ORDER — TRULICITY 3 MG/0.5ML ~~LOC~~ SOAJ: SUBCUTANEOUS | 1 refills | Status: DC

## 2022-05-13 MED ORDER — ATORVASTATIN CALCIUM 40 MG PO TABS: 40.0000 mg | ORAL_TABLET | Freq: Every day | ORAL | 1 refills | Status: DC

## 2022-05-13 MED ORDER — CETIRIZINE HCL 10 MG PO TABS: 10.0000 mg | ORAL_TABLET | Freq: Every day | ORAL | 1 refills | Status: DC

## 2022-05-13 MED ORDER — CLOPIDOGREL BISULFATE 75 MG PO TABS: 75.0000 mg | ORAL_TABLET | Freq: Every day | ORAL | 1 refills | Status: DC

## 2022-05-13 MED ORDER — GABAPENTIN 600 MG PO TABS: 600.0000 mg | ORAL_TABLET | Freq: Four times a day (QID) | ORAL | 3 refills | Status: DC | PRN

## 2022-05-13 MED ORDER — EMPAGLIFLOZIN 25 MG PO TABS: 25.0000 mg | ORAL_TABLET | Freq: Every day | ORAL | 1 refills | Status: DC

## 2022-05-13 MED ORDER — TAMSULOSIN HCL 0.4 MG PO CAPS: 0.4000 mg | ORAL_CAPSULE | Freq: Two times a day (BID) | ORAL | 3 refills | Status: DC

## 2022-05-13 NOTE — Progress Notes (Signed)
Subjective:    Patient ID: Jimmy Craig, male    DOB: March 15, 1951, 71 y.o.   MRN: [id number]  Chief Complaint  Patient presents with   Medical Management of Chronic Issues    Left joint pain in hip wants shot, NASAL DRAINAGE, trouble with throat.    Pt presents to the office today for chronic follow up. He has chronic back pain and has seen Neurosurgeon in the past. He had lumbar surgery on  11/22/20.  He had a MRI in 01/2020. He reports his leg pain is improved, but having pain of 3 out 10.    He is followed by Ortho and had a steroid injection in left hip that did not help.    He has COPD with intermittent SOB and congestion. States the congestion is worsening and the albuterol is not working.    He has ED and states the Viagra made him moody.   Complaining of increased nasal congestion. He is taking Claritin and Flonase daily.  Hypertension This is a chronic problem. The current episode started more than 1 year ago. The problem has been resolved since onset. The problem is controlled. Associated symptoms include malaise/fatigue. Pertinent negatives include no blurred vision, peripheral edema or shortness of breath. Risk factors for coronary artery disease include dyslipidemia, diabetes mellitus, male gender and sedentary lifestyle. The current treatment provides moderate improvement.  Diabetes He presents for his follow-up diabetic visit. He has type 2 diabetes mellitus. Associated symptoms include foot paresthesias. Pertinent negatives for diabetes include no blurred vision. Symptoms are stable. Diabetic complications include peripheral neuropathy. Risk factors for coronary artery disease include dyslipidemia, diabetes mellitus, hypertension, male sex and sedentary lifestyle. He is following a generally healthy diet. His overall blood glucose range is 110-130 mg/dl. Eye exam is current.  Gastroesophageal Reflux He complains of belching and heartburn. This is a chronic problem. The  current episode started more than 1 year ago. The problem occurs occasionally. He has tried a PPI for the symptoms. The treatment provided moderate relief.  Hyperlipidemia This is a chronic problem. The current episode started more than 1 year ago. The problem is controlled. Recent lipid tests were reviewed and are normal. Pertinent negatives include no shortness of breath. Current antihyperlipidemic treatment includes statins. The current treatment provides moderate improvement of lipids. Risk factors for coronary artery disease include dyslipidemia, diabetes mellitus, hypertension, male sex and a sedentary lifestyle.  Arthritis Presents for follow-up visit. He complains of pain and stiffness. The symptoms have been worsening. Affected locations include the left hip, right hip, right knee, left knee, left MCP and right MCP. His pain is at a severity of 8/10 (when walking).  Benign Prostatic Hypertrophy This is a chronic problem. The current episode started more than 1 year ago. Irritative symptoms include nocturia.  Anemia Presents for follow-up visit. Symptoms include malaise/fatigue. There has been no bruising/bleeding easily.  Constipation This is a chronic problem. The current episode started more than 1 year ago. The problem has been resolved since onset. His stool frequency is 1 time per day. He has tried diet changes for the symptoms. The treatment provided moderate relief.      Review of Systems  Constitutional:  Positive for malaise/fatigue.  Eyes:  Negative for blurred vision.  Respiratory:  Negative for shortness of breath.   Gastrointestinal:  Positive for constipation and heartburn.  Genitourinary:  Positive for nocturia.  Musculoskeletal:  Positive for arthritis and stiffness.  Hematological:  Does not bruise/bleed easily.  All other  systems reviewed and are negative.      Objective:   Physical Exam Vitals reviewed.  Constitutional:      General: He is not in acute  distress.    Appearance: He is well-developed.  HENT:     Head: Normocephalic.     Right Ear: Tympanic membrane normal.     Left Ear: Tympanic membrane normal.  Eyes:     General:        Right eye: No discharge.        Left eye: No discharge.     Pupils: Pupils are equal, round, and reactive to light.  Neck:     Thyroid: No thyromegaly.  Cardiovascular:     Rate and Rhythm: Normal rate and regular rhythm.     Heart sounds: Normal heart sounds. No murmur heard. Pulmonary:     Effort: Pulmonary effort is normal. No respiratory distress.     Breath sounds: Normal breath sounds. No wheezing.  Abdominal:     General: Bowel sounds are normal. There is no distension.     Palpations: Abdomen is soft.     Tenderness: There is no abdominal tenderness.  Musculoskeletal:        General: No tenderness. Normal range of motion.     Cervical back: Normal range of motion and neck supple.     Comments: Full ROM of  left hip, pain with walking  Skin:    General: Skin is warm and dry.     Findings: No erythema or rash.  Neurological:     Mental Status: He is alert and oriented to person, place, and time.     Cranial Nerves: No cranial nerve deficit.     Deep Tendon Reflexes: Reflexes are normal and symmetric.  Psychiatric:        Behavior: Behavior normal.        Thought Content: Thought content normal.        Judgment: Judgment normal.       BP 119/68   Pulse 88   Temp (!) 97.4 F (36.3 C) (Temporal)   Ht 5' 5"  (1.651 m)   Wt 137 lb 12.8 oz (62.5 kg)   SpO2 98%   BMI 22.93 kg/m      Assessment & Plan:  Jimmy Craig comes in today with chief complaint of Medical Management of Chronic Issues (Left joint pain in hip wants shot, NASAL DRAINAGE, trouble with throat. )   Diagnosis and orders addressed:  1. Benign prostatic hyperplasia (BPH) with straining on urination - tamsulosin (FLOMAX) 0.4 MG CAPS capsule; Take 1 capsule (0.4 mg total) by mouth in the morning and at  bedtime.  Dispense: 180 capsule; Refill: 3 - CMP14+EGFR - CBC with Differential/Platelet  2. Allergic rhinitis due to pollen, unspecified seasonality Will stop Claritin and start Zyrtec - fluticasone (FLONASE) 50 MCG/ACT nasal spray; Place 2 sprays into both nostrils daily.  Dispense: 16 g; Refill: 6 - CMP14+EGFR - CBC with Differential/Platelet  3. Chronic low back pain with left-sided sciatica, unspecified back pain laterality - gabapentin (NEURONTIN) 600 MG tablet; Take 1 tablet (600 mg total) by mouth 4 (four) times daily as needed.  Dispense: 120 tablet; Refill: 3 - CMP14+EGFR - CBC with Differential/Platelet  4. Neuropathy - gabapentin (NEURONTIN) 600 MG tablet; Take 1 tablet (600 mg total) by mouth 4 (four) times daily as needed.  Dispense: 120 tablet; Refill: 3 - CMP14+EGFR - CBC with Differential/Platelet  5. Chronic obstructive pulmonary disease, unspecified COPD type ([place]) -  fluticasone-salmeterol (ADVAIR) 100-50 MCG/ACT AEPB; Inhale 1 puff into the lungs 2 (two) times daily.  Dispense: 1 each; Refill: 3 - CMP14+EGFR - CBC with Differential/Platelet  6. Hyperlipidemia associated with type 2 diabetes mellitus (HCC) - atorvastatin (LIPITOR) 40 MG tablet; Take 1 tablet (40 mg total) by mouth daily.  Dispense: 90 tablet; Refill: 1 - CMP14+EGFR - CBC with Differential/Platelet  7. Hypertension associated with diabetes ([place]) - lisinopril (ZESTRIL) 10 MG tablet; Take 1 tablet (10 mg total) by mouth daily.  Dispense: 90 tablet; Refill: 3 - CMP14+EGFR - CBC with Differential/Platelet  8. Need for immunization against influenza - Flu Vaccine QUAD High Dose(Fluad) - CMP14+EGFR - CBC with Differential/Platelet  9. GERD without esophagitis - CMP14+EGFR - CBC with Differential/Platelet  10. Type 2 diabetes mellitus with diabetic polyneuropathy, with long-term current use of insulin (HCC) - Dulaglutide (TRULICITY) 3 WS/5.6CL SOPN; INJECT 3 MG SUBCUTANEOUSLY ONCE A WEEK   Dispense: 4 mL; Refill: 1 - metFORMIN (GLUCOPHAGE) 1000 MG tablet; Take 1 tablet (1,000 mg total) by mouth 2 (two) times daily with a meal.  Dispense: 180 tablet; Refill: 1 - empagliflozin (JARDIANCE) 25 MG TABS tablet; Take 1 tablet (25 mg total) by mouth daily before breakfast.  Dispense: 90 tablet; Refill: 1 - CMP14+EGFR - CBC with Differential/Platelet - Bayer DCA Hb A1c Waived  11. Primary osteoarthritis involving multiple joints - CMP14+EGFR - CBC with Differential/Platelet  12. Iron deficiency anemia, unspecified iron deficiency anemia type - CMP14+EGFR - CBC with Differential/Platelet  13. Constipation, unspecified constipation type - CMP14+EGFR - CBC with Differential/Platelet  14. Seasonal allergic rhinitis due to pollen - cetirizine (ZYRTEC) 10 MG tablet; Take 1 tablet (10 mg total) by mouth daily.  Dispense: 90 tablet; Refill: 1  15. Erectile dysfunction, unspecified erectile dysfunction type -Unsure if insurance will cover cialis, will send instead of Viagra  - tadalafil (CIALIS) 20 MG tablet; Take 0.5-1 tablets (10-20 mg total) by mouth every other day as needed for erectile dysfunction.  Dispense: 10 tablet; Refill: 11   Labs pending Health Maintenance reviewed Diet and exercise encouraged  Follow up plan: 3 months   Evelina Dun, FNP

## 2022-05-13 NOTE — Patient Instructions (Signed)
Hip Bursitis Rehab Ask your health care provider which exercises are safe for you. Do exercises exactly as told by your health care provider and adjust them as directed. It is normal to feel mild stretching, pulling, tightness, or discomfort as you do these exercises. Stop right away if you feel sudden pain or your pain gets worse. Do not begin these exercises until told by your health care provider. Stretching exercise This exercise warms up your muscles and joints and improves the movement and flexibility of your hip. This exercise also helps to relieve pain and stiffness. Iliotibial band stretch An iliotibial band is a strong band of muscle tissue that runs from the outer side of your hip to the outer side of your thigh and knee. Lie on your side with your left / right leg in the top position. Bend your left / right knee and grab your ankle. Stretch out your bottom arm to help you balance. Slowly bring your knee back so your thigh is slightly behind your body. Slowly lower your knee toward the floor until you feel a gentle stretch on the outside of your left / right thigh. If you do not feel a stretch and your knee will not lower more toward the floor, place the heel of your other foot on top of your knee and pull your knee down toward the floor with your foot. Hold this position for __________ seconds. Slowly return to the starting position. Repeat __________ times. Complete this exercise __________ times a day. Strengthening exercises These exercises build strength and endurance in your hip and pelvis. Endurance is the ability to use your muscles for a long time, even after they get tired. Bridge This exercise strengthens the muscles that move your thigh backward (hip extensors). Lie on your back on a firm surface with your knees bent and your feet flat on the floor. Tighten your buttocks muscles and lift your buttocks off the floor until your trunk is level with your thighs. Do not arch your  back. You should feel the muscles working in your buttocks and the back of your thighs. If you do not feel these muscles, slide your feet 1-2 inches (2.5-5 cm) farther away from your buttocks. If this exercise is too easy, try doing it with your arms crossed over your chest. Hold this position for __________ seconds. Slowly lower your hips to the starting position. Let your muscles relax completely after each repetition. Repeat __________ times. Complete this exercise __________ times a day. Squats This exercise strengthens the muscles in front of your thigh and knee (quadriceps). Stand in front of a table, with your feet and knees pointing straight ahead. You may rest your hands on the table for balance but not for support. Slowly bend your knees and lower your hips like you are going to sit in a chair. Keep your weight over your heels, not over your toes. Keep your lower legs upright so they are parallel with the table legs. Do not let your hips go lower than your knees. Do not bend lower than told by your health care provider. If your hip pain increases, do not bend as low. Hold the squat position for __________ seconds. Slowly push with your legs to return to standing. Do not use your hands to pull yourself to standing. Repeat __________ times. Complete this exercise __________ times a day. Hip hike  Stand sideways on a bottom step. Stand on your left / right leg with your other foot unsupported next to   the step. You can hold on to the railing or wall for balance if needed. Keep your knees straight and your torso square. Then lift your left / right hip up toward the ceiling. Hold this position for __________ seconds. Slowly let your left / right hip lower toward the floor, past the starting position. Your foot should get closer to the floor. Do not lean or bend your knees. Repeat __________ times. Complete this exercise __________ times a day. Single leg stand This exercise increases  your balance. Without shoes, stand near a railing or in a doorway. You may hold on to the railing or door frame as needed for balance. Squeeze your left / right buttock muscles, then lift up your other foot. Do not let your left / right hip push out to the side. It is helpful to stand in front of a mirror for this exercise so you can watch your hip. Hold this position for __________ seconds. Repeat __________ times. Complete this exercise __________ times a day. This information is not intended to replace advice given to you by your health care provider. Make sure you discuss any questions you have with your health care provider. Document Revised: 07/04/2021 Document Reviewed: 07/04/2021 Elsevier Patient Education  2023 [employer] Inc.  

## 2022-05-14 LAB — CMP14+EGFR
ALT: 22 IU/L (ref 0–44)
AST: 23 IU/L (ref 0–40)
Albumin/Globulin Ratio: 1.5 (ref 1.2–2.2)
Albumin: 4.1 g/dL (ref 3.9–4.9)
Alkaline Phosphatase: 80 IU/L (ref 44–121)
BUN/Creatinine Ratio: 19 (ref 10–24)
BUN: 18 mg/dL (ref 8–27)
Bilirubin Total: <0.2 mg/dL (ref 0.0–1.2)
CO2: 18 mmol/L — ABNORMAL LOW (ref 20–29)
Calcium: 9.2 mg/dL (ref 8.6–10.2)
Chloride: 103 mmol/L (ref 96–106)
Creatinine, Ser: 0.95 mg/dL (ref 0.76–1.27)
Globulin, Total: 2.8 g/dL (ref 1.5–4.5)
Glucose: 145 mg/dL — ABNORMAL HIGH (ref 70–99)
Potassium: 4.6 mmol/L (ref 3.5–5.2)
Sodium: 140 mmol/L (ref 134–144)
Total Protein: 6.9 g/dL (ref 6.0–8.5)
eGFR: 87 mL/min/{1.73_m2} (ref >59)

## 2022-05-14 LAB — CBC WITH DIFFERENTIAL/PLATELET
Basophils Absolute: 0.1 10*3/uL (ref 0.0–0.2)
Basos: 1 %
EOS (ABSOLUTE): 0.1 10*3/uL (ref 0.0–0.4)
Eos: 2 %
Hematocrit: 37.6 % (ref 37.5–51.0)
Hemoglobin: 11.7 g/dL — ABNORMAL LOW (ref 13.0–17.7)
Immature Grans (Abs): 0 10*3/uL (ref 0.0–0.1)
Immature Granulocytes: 0 %
Lymphocytes Absolute: 1.8 10*3/uL (ref 0.7–3.1)
Lymphs: 32 %
MCH: 25.5 pg — ABNORMAL LOW (ref 26.6–33.0)
MCHC: 31.1 g/dL — ABNORMAL LOW (ref 31.5–35.7)
MCV: 82 fL (ref 79–97)
Monocytes Absolute: 0.6 10*3/uL (ref 0.1–0.9)
Monocytes: 10 %
Neutrophils Absolute: 3 10*3/uL (ref 1.4–7.0)
Neutrophils: 55 %
Platelets: 327 10*3/uL (ref 150–450)
RBC: 4.59 x10E6/uL (ref 4.14–5.80)
RDW: 15.8 % — ABNORMAL HIGH (ref 11.6–15.4)
WBC: 5.6 10*3/uL (ref 3.4–10.8)

## 2022-05-16 ENCOUNTER — Other Ambulatory Visit: Payer: Self-pay | Admitting: Family

## 2022-05-16 LAB — SPECIMEN STATUS REPORT

## 2022-05-16 LAB — B12 AND FOLATE PANEL
Folate: >20 ng/mL (ref >3.0)
Vitamin B-12: 1131 pg/mL (ref 232–1245)

## 2022-05-16 LAB — IRON AND TIBC
Iron Saturation: 7 % — CL (ref 15–55)
Iron: 26 ug/dL — ABNORMAL LOW (ref 38–169)
Total Iron Binding Capacity: 376 ug/dL (ref 250–450)
UIBC: 350 ug/dL — ABNORMAL HIGH (ref 111–343)

## 2022-05-16 NOTE — Progress Notes (Signed)
Patient r/c. Please call back at [phone]

## 2022-05-17 DIAGNOSIS — Z791 Long term (current) use of non-steroidal anti-inflammatories (NSAID): Secondary | ICD-10-CM | POA: Diagnosis not present

## 2022-05-17 DIAGNOSIS — E1151 Type 2 diabetes mellitus with diabetic peripheral angiopathy without gangrene: Secondary | ICD-10-CM | POA: Diagnosis not present

## 2022-05-17 DIAGNOSIS — M1990 Unspecified osteoarthritis, unspecified site: Secondary | ICD-10-CM | POA: Diagnosis not present

## 2022-05-17 DIAGNOSIS — N400 Enlarged prostate without lower urinary tract symptoms: Secondary | ICD-10-CM | POA: Diagnosis not present

## 2022-05-17 DIAGNOSIS — E1165 Type 2 diabetes mellitus with hyperglycemia: Secondary | ICD-10-CM | POA: Diagnosis not present

## 2022-05-17 DIAGNOSIS — J449 Chronic obstructive pulmonary disease, unspecified: Secondary | ICD-10-CM | POA: Diagnosis not present

## 2022-05-17 DIAGNOSIS — N529 Male erectile dysfunction, unspecified: Secondary | ICD-10-CM | POA: Diagnosis not present

## 2022-05-17 DIAGNOSIS — Z7984 Long term (current) use of oral hypoglycemic drugs: Secondary | ICD-10-CM | POA: Diagnosis not present

## 2022-05-17 DIAGNOSIS — I10 Essential (primary) hypertension: Secondary | ICD-10-CM | POA: Diagnosis not present

## 2022-05-17 DIAGNOSIS — E785 Hyperlipidemia, unspecified: Secondary | ICD-10-CM | POA: Diagnosis not present

## 2022-05-17 DIAGNOSIS — J309 Allergic rhinitis, unspecified: Secondary | ICD-10-CM | POA: Diagnosis not present

## 2022-05-17 DIAGNOSIS — K219 Gastro-esophageal reflux disease without esophagitis: Secondary | ICD-10-CM | POA: Diagnosis not present

## 2022-07-08 ENCOUNTER — Other Ambulatory Visit: Payer: Self-pay | Admitting: Family

## 2022-07-18 ENCOUNTER — Encounter: Payer: Self-pay | Admitting: Family Medicine

## 2022-07-18 ENCOUNTER — Ambulatory Visit (INDEPENDENT_AMBULATORY_CARE_PROVIDER_SITE_OTHER): Payer: 59 | Admitting: Family Medicine

## 2022-07-18 VITALS — BP 136/81 | HR 86 | Temp 97.3°F | Ht 65.0 in | Wt 137.2 lb

## 2022-07-18 DIAGNOSIS — M79604 Pain in right leg: Secondary | ICD-10-CM

## 2022-07-18 DIAGNOSIS — J189 Pneumonia, unspecified organism: Secondary | ICD-10-CM | POA: Diagnosis not present

## 2022-07-18 DIAGNOSIS — M79605 Pain in left leg: Secondary | ICD-10-CM | POA: Diagnosis not present

## 2022-07-18 MED ORDER — AMOXICILLIN-POT CLAVULANATE 875-125 MG PO TABS: 1.0000 | ORAL_TABLET | Freq: Two times a day (BID) | ORAL | 0 refills | Status: AC

## 2022-07-18 MED ORDER — AZITHROMYCIN 250 MG PO TABS: ORAL_TABLET | ORAL | 0 refills | Status: DC

## 2022-07-18 MED ORDER — PREDNISONE 10 MG (21) PO TBPK: ORAL_TABLET | ORAL | 0 refills | Status: DC

## 2022-07-18 MED ORDER — ALBUTEROL SULFATE HFA 108 (90 BASE) MCG/ACT IN AERS: 2.0000 | INHALATION_SPRAY | Freq: Four times a day (QID) | RESPIRATORY_TRACT | 2 refills | Status: DC | PRN

## 2022-07-18 NOTE — Progress Notes (Signed)
Subjective:  Patient ID: Jimmy Craig, male    DOB: Feb 03, 1951, 71 y.o.   MRN: [id number]  Patient Care Team: Sharion Balloon, FNP as PCP - General (Family Medicine) Satira Sark, MD as PCP - Cardiology (Cardiology)   Chief Complaint:  Leg Pain (Bilateral x 2 years and now getting worse. Patient now has trouble walking. ), Cough (X 1 month ), and Nasal Congestion (Runny nose- x 2 months )   HPI: Jimmy Craig is a 71 y.o. male presenting on 07/18/2022 for Leg Pain (Bilateral x 2 years and now getting worse. Patient now has trouble walking. ), Cough (X 1 month ), and Nasal Congestion (Runny nose- x 2 months )   Pt presents today with complaints of bilateral leg pain. This is a chronic problem. The current episode started several months ago. The problem occurs when walking, eases at rest. The problem has been worsening. The quality of the pain is described as aching, heavy, burning, tired. The pain is at a severity of 8/10 at times. The pain does radiate down both legs. Pt denies loss of bowel or bladder, lower back pain, saddle anesthesia, weight loss, weakness, or confusion. Pt has tried celebrex for the symptoms. The treatment provided no relief.   He also reports worsening cough, congestion, and rhinorrhea for the last 2-3 weeks. He as been using over the counter medications without relief of symptoms. He reports he has had a fever and coughs up brown colored sputum.   Relevant past medical, surgical, family, and social history reviewed and updated as indicated.  Allergies and medications reviewed and updated. Data reviewed: Chart in Epic.   Past Medical History:  Diagnosis Date   Arthritis    BPH (benign prostatic hyperplasia)    Chronic back pain    GERD (gastroesophageal reflux disease)    Hypertension    Mixed hyperlipidemia    Type 2 diabetes mellitus ([place])     Past Surgical History:  Procedure Laterality Date   CATARACT EXTRACTION Right    CATARACT  EXTRACTION W/PHACO Left 12/20/2013   Procedure: CATARACT EXTRACTION PHACO AND INTRAOCULAR LENS PLACEMENT ([place]);  Surgeon: Williams Che, MD;  Location: [facility];  Service: Ophthalmology;  Laterality: Left;  CDE: 0.97    Social History   Socioeconomic History   Marital status: Married    Spouse name: Not on file   Number of children: Not on file   Years of education: Not on file   Highest education level: Not on file  Occupational History   Not on file  Tobacco Use   Smoking status: Never   Smokeless tobacco: Never  Substance and Sexual Activity   Alcohol use: No   Drug use: No   Sexual activity: Yes    Birth control/protection: None  Other Topics Concern   Not on file  Social History Narrative   Not on file   Social Determinants of Health   Financial Resource Strain: Not on file  Food Insecurity: Not on file  Transportation Needs: Not on file  Physical Activity: Not on file  Stress: Not on file  Social Connections: Not on file  Intimate Partner Violence: Not on file    Outpatient Encounter Medications as of 07/18/2022  Medication Sig   albuterol (VENTOLIN HFA) 108 (90 Base) MCG/ACT inhaler Inhale 2 puffs into the lungs every 6 (six) hours as needed for wheezing or shortness of breath.   amoxicillin-clavulanate (AUGMENTIN) 875-125 MG tablet Take 1 tablet by mouth  2 (two) times daily for 7 days.   atorvastatin (LIPITOR) 40 MG tablet Take 1 tablet (40 mg total) by mouth daily.   azelastine (OPTIVAR) 0.05 % ophthalmic solution Place 1 drop into both eyes 2 (two) times daily.   azithromycin (ZITHROMAX Z-PAK) 250 MG tablet As directed   blood glucose meter kit and supplies Dispense based on patient and insurance preference. Use up to four times daily as directed. (FOR ICD-10 E10.9, E11.9).   celecoxib (CELEBREX) 200 MG capsule Take 1 capsule by mouth once daily   cetirizine (ZYRTEC) 10 MG tablet Take 1 tablet (10 mg total) by mouth daily.   clopidogrel (PLAVIX) 75 MG tablet  Take 1 tablet (75 mg total) by mouth daily.   Dulaglutide (TRULICITY) 3 ZO/1.0RU SOPN INJECT 3 MG SUBCUTANEOUSLY ONCE A WEEK   empagliflozin (JARDIANCE) 25 MG TABS tablet Take 1 tablet (25 mg total) by mouth daily before breakfast.   fluticasone (FLONASE) 50 MCG/ACT nasal spray Place 2 sprays into both nostrils daily.   fluticasone-salmeterol (ADVAIR) 100-50 MCG/ACT AEPB Inhale 1 puff into the lungs 2 (two) times daily.   gabapentin (NEURONTIN) 600 MG tablet Take 1 tablet (600 mg total) by mouth 4 (four) times daily as needed.   glucose blood (ACCU-CHEK GUIDE) test strip Use 1 strip up to 4 times daily.  E10.9, E11.9   Insulin Glargine (BASAGLAR KWIKPEN) 100 UNIT/ML Inject 42 Units into the skin daily.   Insulin Pen Needle (PEN NEEDLES) 32G X 4 MM MISC 1 application by Does not apply route daily.   Lancets (ONETOUCH DELICA PLUS EAVWUJ81X) MISC Apply 1 each topically 4 (four) times daily.   lisinopril (ZESTRIL) 10 MG tablet Take 1 tablet (10 mg total) by mouth daily.   metFORMIN (GLUCOPHAGE) 1000 MG tablet Take 1 tablet (1,000 mg total) by mouth 2 (two) times daily with a meal.   Multiple Vitamin (MULTIVITAMIN WITH MINERALS) TABS tablet Take 1 tablet by mouth daily.   olopatadine (PATADAY) 0.1 % ophthalmic solution Place 1 drop into both eyes 2 (two) times daily.   pantoprazole (PROTONIX) 40 MG tablet Take 1 tablet by mouth once daily   polyethylene glycol powder (GLYCOLAX/MIRALAX) 17 GM/SCOOP powder Take 17 g by mouth 2 (two) times daily as needed.   predniSONE (STERAPRED UNI-PAK 21 TAB) 10 MG (21) TBPK tablet As directed x 6 days   tadalafil (CIALIS) 20 MG tablet Take 0.5-1 tablets (10-20 mg total) by mouth every other day as needed for erectile dysfunction.   tamsulosin (FLOMAX) 0.4 MG CAPS capsule Take 1 capsule (0.4 mg total) by mouth in the morning and at bedtime.   [DISCONTINUED] albuterol (VENTOLIN HFA) 108 (90 Base) MCG/ACT inhaler INHALE 2 PUFFS BY MOUTH EVERY 6 HOURS AS NEEDED FOR  WHEEZING FOR SHORTNESS OF BREATH   aspirin EC 81 MG tablet Take 1 tablet (81 mg total) by mouth daily. Swallow whole. (Patient not taking: Reported on 07/18/2022)   baclofen (LIORESAL) 10 MG tablet TAKE 1 TABLET BY MOUTH THREE TIMES DAILY (Patient not taking: Reported on 07/18/2022)   No facility-administered encounter medications on file as of 07/18/2022.    No Known Allergies  Review of Systems  Constitutional:  Positive for activity change, chills, fatigue and fever. Negative for appetite change, diaphoresis and unexpected weight change.  HENT:  Positive for congestion. Negative for dental problem, drooling, ear discharge, ear pain, facial swelling, hearing loss, mouth sores, nosebleeds, postnasal drip, rhinorrhea, sinus pressure, sinus pain, sneezing, sore throat, tinnitus, trouble swallowing and voice change.  Respiratory:  Positive for cough, shortness of breath and wheezing. Negative for apnea, choking, chest tightness and stridor.   Cardiovascular:  Negative for chest pain, palpitations and leg swelling.  Genitourinary:  Negative for decreased urine volume and difficulty urinating.  Musculoskeletal:  Positive for arthralgias, gait problem and myalgias. Negative for back pain, joint swelling, neck pain and neck stiffness.  Neurological:  Negative for dizziness and weakness.  Psychiatric/Behavioral:  Negative for confusion.   All other systems reviewed and are negative.       Objective:  BP 136/81   Pulse 86   Temp (!) 97.3 F (36.3 C) (Temporal)   Ht _0  (1.651 m)   Wt 137 lb 3.2 oz (62.2 kg)   SpO2 96%   BMI 22.83 kg/m    Wt Readings from Last 3 Encounters:  07/18/22 137 lb 3.2 oz (62.2 kg)  05/13/22 137 lb 12.8 oz (62.5 kg)  01/29/22 144 lb 12.8 oz (65.7 kg)    Physical Exam Vitals and nursing note reviewed.  Constitutional:      General: He is not in acute distress.    Appearance: Normal appearance. He is well-developed and well-groomed. He is not  ill-appearing, toxic-appearing or diaphoretic.  HENT:     Head: Normocephalic and atraumatic.     Jaw: There is normal jaw occlusion.     Right Ear: Hearing normal.     Left Ear: Hearing normal.     Nose: Nose normal.     Mouth/Throat:     Lips: Pink.     Mouth: Mucous membranes are moist.     Pharynx: Oropharynx is clear. Uvula midline.  Eyes:     General: Lids are normal.     Extraocular Movements: Extraocular movements intact.     Conjunctiva/sclera: Conjunctivae normal.     Pupils: Pupils are equal, round, and reactive to light.  Neck:     Thyroid: No thyroid mass, thyromegaly or thyroid tenderness.     Vascular: No carotid bruit or JVD.     Trachea: Trachea and phonation normal.  Cardiovascular:     Rate and Rhythm: Normal rate and regular rhythm.     Chest Wall: PMI is not displaced.     Pulses:          Dorsalis pedis pulses are 1+ on the right side and 1+ on the left side.       Posterior tibial pulses are 1+ on the right side and 1+ on the left side.     Heart sounds: Normal heart sounds. No murmur heard.    No friction rub. No gallop.  Pulmonary:     Effort: Pulmonary effort is normal. No respiratory distress.     Breath sounds: Examination of the left-lower field reveals wheezing and rhonchi. Wheezing and rhonchi present.  Abdominal:     General: Bowel sounds are normal. There is no distension or abdominal bruit.     Palpations: Abdomen is soft. There is no hepatomegaly or splenomegaly.     Tenderness: There is no abdominal tenderness. There is no right CVA tenderness or left CVA tenderness.     Hernia: No hernia is present.  Musculoskeletal:        General: Normal range of motion.     Cervical back: Normal range of motion and neck supple.     Right lower leg: No edema.     Left lower leg: No edema.  Lymphadenopathy:     Cervical: No cervical adenopathy.  Skin:  General: Skin is warm and dry.     Capillary Refill: Capillary refill takes less than 2 seconds.      Coloration: Skin is not cyanotic, jaundiced or pale.     Findings: No rash.  Neurological:     General: No focal deficit present.     Mental Status: He is alert and oriented to person, place, and time.     Sensory: Sensation is intact.     Motor: Motor function is intact.     Coordination: Coordination is intact.     Gait: Gait is intact.     Deep Tendon Reflexes: Reflexes are normal and symmetric.  Psychiatric:        Attention and Perception: Attention and perception normal.        Mood and Affect: Mood and affect normal.        Speech: Speech normal.        Behavior: Behavior normal. Behavior is cooperative.        Thought Content: Thought content normal.        Cognition and Memory: Cognition and memory normal.        Judgment: Judgment normal.     Results for orders placed or performed in visit on 06/06/22  HM DIABETES EYE EXAM  Result Value Ref Range   HM Diabetic Eye Exam No Retinopathy No Retinopathy       Pertinent labs & imaging results that were available during my care of the patient were reviewed by me and considered in my medical decision making.  Assessment & Plan:  Greyson was seen today for leg pain, cough and nasal congestion.  Diagnoses and all orders for this visit:  Bilateral leg pain Reported symptoms concerning for arterial claudication. Will obtain ABI studies. If abnormal, will refer to vascular surgery, if normal, will refer back to neurosurgery. No red flags concerning for ischemia or cauda equina syndrome.  -     [place] Lower Ext Art Bilat; Future  Community acquired pneumonia of left lower lobe of lung Imaging not available today but pt has classic pneumonia with focus in LLL. Will treat as CAP due to ongoing and worsening symptoms. Follow up in 2 weeks for reevaluation.  -     predniSONE (STERAPRED UNI-PAK 21 TAB) 10 MG (21) TBPK tablet; As directed x 6 days -     albuterol (VENTOLIN HFA) 108 (90 Base) MCG/ACT inhaler; Inhale 2 puffs into the  lungs every 6 (six) hours as needed for wheezing or shortness of breath. -     amoxicillin-clavulanate (AUGMENTIN) 875-125 MG tablet; Take 1 tablet by mouth 2 (two) times daily for 7 days. -     azithromycin (ZITHROMAX Z-PAK) 250 MG tablet; As directed     Continue all other maintenance medications.  Follow up plan: Return if symptoms worsen or fail to improve.   Continue healthy lifestyle choices, including diet (rich in fruits, vegetables, and lean proteins, and low in salt and simple carbohydrates) and exercise (at least 30 minutes of moderate physical activity daily).   The above assessment and management plan was discussed with the patient. The patient verbalized understanding of and has agreed to the management plan. Patient is aware to call the clinic if they develop any new symptoms or if symptoms persist or worsen. Patient is aware when to return to the clinic for a follow-up visit. Patient educated on when it is appropriate to go to the emergency department.   Monia Pouch, FNP-C [facility]  [phone]

## 2022-07-22 ENCOUNTER — Telehealth: Payer: Self-pay | Admitting: Family

## 2022-07-25 ENCOUNTER — Telehealth: Payer: Self-pay | Admitting: Family

## 2022-07-25 NOTE — Telephone Encounter (Signed)
I spoke with pt and advised he would ntbs for any further treatment but pt just wanted "pain injection" and I again advised him he would ntbs. Pt did not want to come in and then requested more oral steroids at which point I advised he would still ntbs if he is not any better and pt said thank you and hung up.

## 2022-07-25 NOTE — Telephone Encounter (Signed)
Looks like Marcelino Duster ordered an [place] artery lower extremity doppler. Let me know if order needs to be changed.

## 2022-07-25 NOTE — Telephone Encounter (Signed)
  Incoming Patient Call  07/25/2022  What symptoms do you have? Pain in leg can not walk  How long have you been sick? For a long time but the last two to three weeks has gotten worse  Have you been seen for this problem? Yes he has finished taking medication he is a little better but still having pain   If your provider decides to give you a prescription, which pharmacy would you like for it to be sent to? [facility]   Patient informed that this information will be sent to the clinical staff for review and that they should receive a follow up call.

## 2022-07-26 ENCOUNTER — Other Ambulatory Visit: Payer: Self-pay | Admitting: Family

## 2022-07-26 DIAGNOSIS — M25559 Pain in unspecified hip: Secondary | ICD-10-CM

## 2022-07-26 DIAGNOSIS — E1142 Type 2 diabetes mellitus with diabetic polyneuropathy: Secondary | ICD-10-CM

## 2022-07-26 DIAGNOSIS — M159 Polyosteoarthritis, unspecified: Secondary | ICD-10-CM

## 2022-08-08 ENCOUNTER — Encounter (INDEPENDENT_AMBULATORY_CARE_PROVIDER_SITE_OTHER): Payer: 59 | Admitting: Ophthalmology

## 2022-08-08 ENCOUNTER — Encounter (INDEPENDENT_AMBULATORY_CARE_PROVIDER_SITE_OTHER): Payer: Self-pay

## 2022-08-13 ENCOUNTER — Encounter: Payer: Self-pay | Admitting: Family

## 2022-08-13 ENCOUNTER — Ambulatory Visit (INDEPENDENT_AMBULATORY_CARE_PROVIDER_SITE_OTHER): Payer: Commercial Managed Care - HMO | Admitting: Family

## 2022-08-13 VITALS — BP 130/73 | HR 79 | Temp 97.1°F | Ht 65.0 in | Wt 138.8 lb

## 2022-08-13 DIAGNOSIS — K5900 Constipation, unspecified: Secondary | ICD-10-CM

## 2022-08-13 DIAGNOSIS — E1142 Type 2 diabetes mellitus with diabetic polyneuropathy: Secondary | ICD-10-CM

## 2022-08-13 DIAGNOSIS — Z23 Encounter for immunization: Secondary | ICD-10-CM | POA: Diagnosis not present

## 2022-08-13 DIAGNOSIS — K219 Gastro-esophageal reflux disease without esophagitis: Secondary | ICD-10-CM | POA: Diagnosis not present

## 2022-08-13 DIAGNOSIS — E1159 Type 2 diabetes mellitus with other circulatory complications: Secondary | ICD-10-CM | POA: Diagnosis not present

## 2022-08-13 DIAGNOSIS — E1169 Type 2 diabetes mellitus with other specified complication: Secondary | ICD-10-CM | POA: Diagnosis not present

## 2022-08-13 DIAGNOSIS — I739 Peripheral vascular disease, unspecified: Secondary | ICD-10-CM

## 2022-08-13 DIAGNOSIS — I152 Hypertension secondary to endocrine disorders: Secondary | ICD-10-CM

## 2022-08-13 DIAGNOSIS — M159 Polyosteoarthritis, unspecified: Secondary | ICD-10-CM | POA: Diagnosis not present

## 2022-08-13 DIAGNOSIS — Z794 Long term (current) use of insulin: Secondary | ICD-10-CM

## 2022-08-13 DIAGNOSIS — E785 Hyperlipidemia, unspecified: Secondary | ICD-10-CM

## 2022-08-13 DIAGNOSIS — J449 Chronic obstructive pulmonary disease, unspecified: Secondary | ICD-10-CM

## 2022-08-13 DIAGNOSIS — R351 Nocturia: Secondary | ICD-10-CM

## 2022-08-13 DIAGNOSIS — N401 Enlarged prostate with lower urinary tract symptoms: Secondary | ICD-10-CM

## 2022-08-13 LAB — BAYER DCA HB A1C WAIVED: HB A1C (BAYER DCA - WAIVED): 7 % — ABNORMAL HIGH (ref 4.8–5.6)

## 2022-08-13 NOTE — Addendum Note (Signed)
Addended by: Brynda Peon F on: 08/13/2022 02:07 PM   Modules accepted: Orders

## 2022-08-13 NOTE — Progress Notes (Signed)
Subjective:    Patient ID: Jimmy Craig, male    DOB: 23-May-1951, 72 y.o.   MRN: [id number]  Chief Complaint  Patient presents with   Medical Management of Chronic Issues   Pt presents to the office today for chronic follow up. He has chronic back pain and has seen Neurosurgeon in the past. He had lumbar surgery on  11/22/20.  He had a MRI in 01/2020. He reports his leg pain is a pain of 9 out 10 when walking but resolves when resting.    He is followed by Ortho and had a steroid injection in left hip that did not help.    He has COPD with intermittent SOB and congestion. States the congestion is worsening and the albuterol is not working.    He has ED and states the Viagra made him moody.    Hypertension This is a chronic problem. The current episode started more than 1 year ago. The problem has been resolved since onset. The problem is controlled. Associated symptoms include blurred vision and malaise/fatigue. Pertinent negatives include no peripheral edema or shortness of breath. Risk factors for coronary artery disease include dyslipidemia, male gender, sedentary lifestyle and diabetes mellitus. The current treatment provides moderate improvement.  Gastroesophageal Reflux He complains of belching and heartburn. This is a chronic problem. The current episode started more than 1 year ago. The problem occurs occasionally. The problem has been resolved. He has tried a PPI for the symptoms. The treatment provided moderate relief.  Hyperlipidemia This is a chronic problem. The current episode started more than 1 year ago. The problem is controlled. Pertinent negatives include no shortness of breath. Current antihyperlipidemic treatment includes statins. The current treatment provides moderate improvement of lipids. Risk factors for coronary artery disease include dyslipidemia, diabetes mellitus, hypertension, a sedentary lifestyle and post-menopausal.  Diabetes He presents for his follow-up  diabetic visit. He has type 2 diabetes mellitus. Associated symptoms include blurred vision and foot paresthesias. Symptoms are stable. Diabetic complications include peripheral neuropathy. Pertinent negatives for diabetic complications include no nephropathy. Risk factors for coronary artery disease include diabetes mellitus, hypertension, sedentary lifestyle, post-menopausal, dyslipidemia and male sex. He is following a generally healthy diet. His overall blood glucose range is 110-130 mg/dl. An ACE inhibitor/angiotensin II receptor blocker is being taken. Eye exam is current.  Arthritis Presents for follow-up visit. He complains of pain and stiffness. The symptoms have been stable. Affected locations include the left knee, right knee, left MCP and right MCP. His pain is at a severity of 7/10.  Benign Prostatic Hypertrophy This is a chronic problem. The current episode started more than 1 year ago. Irritative symptoms include nocturia (3). Past treatments include tamsulosin. The treatment provided moderate relief.  Anemia Presents for follow-up visit. Symptoms include malaise/fatigue.  Constipation This is a chronic problem. The current episode started more than 1 year ago. His stool frequency is 2 to 3 times per week. He has tried laxatives for the symptoms. The treatment provided moderate relief.      Review of Systems  Constitutional:  Positive for malaise/fatigue.  Eyes:  Positive for blurred vision.  Respiratory:  Negative for shortness of breath.   Gastrointestinal:  Positive for constipation and heartburn.  Genitourinary:  Positive for nocturia (3).  Musculoskeletal:  Positive for arthritis and stiffness.  All other systems reviewed and are negative.      Objective:   Physical Exam Vitals reviewed.  Constitutional:      General: He is not  in acute distress.    Appearance: He is well-developed.  HENT:     Head: Normocephalic.     Right Ear: Tympanic membrane normal.     Left  Ear: Tympanic membrane normal.  Eyes:     General:        Right eye: No discharge.        Left eye: No discharge.     Pupils: Pupils are equal, round, and reactive to light.  Neck:     Thyroid: No thyromegaly.  Cardiovascular:     Rate and Rhythm: Normal rate and regular rhythm.     Heart sounds: Normal heart sounds. No murmur heard. Pulmonary:     Effort: Pulmonary effort is normal. No respiratory distress.     Breath sounds: Normal breath sounds. No wheezing.  Abdominal:     General: Bowel sounds are normal. There is no distension.     Palpations: Abdomen is soft.     Tenderness: There is no abdominal tenderness.  Musculoskeletal:        General: No tenderness. Normal range of motion.     Cervical back: Normal range of motion and neck supple.  Skin:    General: Skin is warm and dry.     Findings: No erythema or rash.  Neurological:     Mental Status: He is alert and oriented to person, place, and time.     Cranial Nerves: No cranial nerve deficit.     Deep Tendon Reflexes: Reflexes are normal and symmetric.  Psychiatric:        Behavior: Behavior normal.        Thought Content: Thought content normal.        Judgment: Judgment normal.       BP 130/73   Pulse 79   Temp (!) 97.1 F (36.2 C) (Temporal)   Ht 5\' 5"  (1.651 m)   Wt 138 lb 12.8 oz (63 kg)   SpO2 99%   BMI 23.10 kg/m      Assessment & Plan:  Jimmy Craig comes in today with chief complaint of Medical Management of Chronic Issues   Diagnosis and orders addressed:  1. Primary osteoarthritis involving multiple joints - CMP14+EGFR  2. Hypertension associated with diabetes (HCC) - CMP14+EGFR  3. Hyperlipidemia associated with type 2 diabetes mellitus (HCC) - CMP14+EGFR  4. GERD without esophagitis - CMP14+EGFR  5. Type 2 diabetes mellitus with diabetic polyneuropathy, with long-term current use of insulin (HCC) - Bayer DCA Hb A1c Waived - CMP14+EGFR  6. Constipation, unspecified  constipation type - CMP14+EGFR  7. Benign prostatic hyperplasia with nocturia - CMP14+EGFR  8. Intermittent claudication [facility]) - Ambulatory referral to Vascular Surgery   Labs pending Health Maintenance reviewed Diet and exercise encouraged  Follow up plan: 3 months    [facility], FNP

## 2022-08-13 NOTE — Patient Instructions (Signed)
Intermittent Claudication Intermittent claudication is pain in one leg or both legs that occurs when walking or exercising and goes away when resting. This condition is a symptom of peripheral vascular disease (PVD). PVD is a disease of the blood vessels. This condition is commonly treated with physical activity, medicine, and lifestyle changes. If medical management does not improve symptoms, surgery may be done to restore blood flow. This surgery is called revascularization. What are the causes?  This condition is caused by a buildup of fatty material and other substances (plaque) within the arteries (atherosclerosis). Plaque makes arteries stiff and narrow, preventing proper blood flow to the leg muscles. Pain occurs when you walk or exercise because your muscles need more blood when you are moving and exercising but cannot get it because of poor blood flow. What increases the risk? The following factors may make you more likely to develop this condition: Smoking cigarettes. A personal history of stroke or heart disease. Age. The older you are, the higher the risk. Being inactive (sedentary lifestyle) or being overweight. A family history of atherosclerosis. Having another health condition, such as: Diabetes. High blood pressure. High cholesterol. What are the signs or symptoms? Symptoms of this condition can be in one leg or both legs, and can occur in the feet, calf, thigh, hip, or buttock over time. Symptoms may include: Aches or pains when walking. Cramps. A feeling of tightness, weakness, or heaviness. A wound on the lower leg or foot that heals poorly or does not heal. How is this diagnosed? This condition may be diagnosed based on: Your symptoms. Your medical history. A physical exam. Tests, such as: Ankle-brachial index (ABI) to check blood pressure in the legs and compare it to the pressure in the arms. Exercise ankle-brachial test. For this test, you walk on a treadmill.  Tests are done to check how the condition affects your ability to walk or exercise. Arterial duplex ultrasound to view how blood flows within arteries. CT angiogram (CTA). An X-ray machine is used to take pictures of the blood vessels after dye is injected. Magnetic resonance angiogram (MRA). This creates images of blood vessels and blood flow within them. Angiogram. In this procedure, dye is injected into arteries and then X-rays are taken. Blood tests. How is this treated? Treatment for this condition involves treating the underlying cause and managing risk factors, such as high blood pressure, high cholesterol, or diabetes. Treatment may include: Lifestyle changes, such as: Starting a supervised or home-based exercise program. Losing weight. Quitting smoking. Medicines to help restore blood flow through your legs. If you have symptoms that affect your everyday activities, or have a wound that is not healing, treatment may include: Angioplasty to open a blocked artery using an inflated balloon. Stent implant to open a blocked artery using a mesh-like tube. Surgery to restore blood flow by creating a bypass around a blocked area. Follow these instructions at home: Lifestyle  Maintain a healthy weight. Eat a diet that is low in saturated fats and calories. Consider working with a dietitianto help you make healthy food choices. Do not use any products that contain nicotine or tobacco. These products include cigarettes, chewing tobacco, and vaping devices, such as e-cigarettes. If you need help quitting, ask your health care provider. If your health care provider recommended an exercise program for you, follow it as directed. Your exercise program may involve: Walking three or more times a week. Walking until you have certain symptoms of intermittent claudication, resting until your symptoms go   away, and then resuming your walk. Gradually increasing your total walking time to about 50 minutes  a day. General instructions Work with your health care provider to manage other health conditions that may increase your risk, including diabetes, high blood pressure, or high cholesterol. Take over-the-counter and prescription medicines only as told by your health care provider. Keep all follow-up visits. This is important. Contact a health care provider if: Your pain does not go away with rest. You have sores on your legs that do not heal or have pus or a bad smell. Your condition gets worse or does not improve with treatment. Get help right away if: You have chest pain. You have trouble breathing. Your foot or leg is cold or it changes color. Your foot or leg becomes numb. You have any symptoms of a stroke. "BE FAST" is an easy way to remember the main warning signs of a stroke: B - Balance. Signs are dizziness, sudden trouble walking, or loss of balance. E - Eyes. Signs are trouble seeing or a sudden change in vision. F - Face. Signs are sudden weakness or numbness of the face, or the face or eyelid drooping on one side. A - Arms. Signs are weakness or numbness in an arm. This happens suddenly and usually on one side of the body. S - Speech. Signs are sudden trouble speaking, slurred speech, or trouble understanding what people say. T - Time. Time to call emergency services. Write down what time symptoms started. You have other signs of a stroke, such as: A sudden, severe headache with no known cause. Nausea or vomiting. Seizure. These symptoms may represent a serious problem that is an emergency. Do not wait to see if the symptoms resolve. Get medical help right away. Call your local emergency services (911 in the [place]). Do not drive yourself to the hospital.  Summary Intermittent claudication is pain in the leg or legs that occurs when walking or exercising and goes away when resting. This condition is caused by a buildup of plaque in the arteries. Plaque makes arteries stiff and  narrow, which prevents proper blood flow to the leg. Intermittent claudication can be treated with medicine and lifestyle changes. If these fail, surgery may be done to restore blood flow to the affected area. Work with your health care provider to manage other health conditions you may have, including diabetes, high blood pressure, or high cholesterol. This information is not intended to replace advice given to you by your health care provider. Make sure you discuss any questions you have with your health care provider. Document Revised: 01/24/2020 Document Reviewed: 01/24/2020 Elsevier Patient Education  2023 Elsevier Inc.  

## 2022-08-14 LAB — CMP14+EGFR
ALT: 20 IU/L (ref 0–44)
AST: 22 IU/L (ref 0–40)
Albumin/Globulin Ratio: 2 (ref 1.2–2.2)
Albumin: 4.5 g/dL (ref 3.9–4.9)
Alkaline Phosphatase: 81 IU/L (ref 44–121)
BUN/Creatinine Ratio: 18 (ref 10–24)
BUN: 15 mg/dL (ref 8–27)
Bilirubin Total: 0.2 mg/dL (ref 0.0–1.2)
CO2: 22 mmol/L (ref 20–29)
Calcium: 9.3 mg/dL (ref 8.6–10.2)
Chloride: 105 mmol/L (ref 96–106)
Creatinine, Ser: 0.85 mg/dL (ref 0.76–1.27)
Globulin, Total: 2.3 g/dL (ref 1.5–4.5)
Glucose: 138 mg/dL — ABNORMAL HIGH (ref 70–99)
Potassium: 4.5 mmol/L (ref 3.5–5.2)
Sodium: 143 mmol/L (ref 134–144)
Total Protein: 6.8 g/dL (ref 6.0–8.5)
eGFR: 94 mL/min/{1.73_m2} (ref >59)

## 2022-08-16 ENCOUNTER — Ambulatory Visit (HOSPITAL_COMMUNITY)
Admission: RE | Admit: 2022-08-16 | Discharge: 2022-08-16 | Disposition: A | Discharge status: Home / Self Care | Payer: Commercial Managed Care - HMO | Source: Ambulatory Visit | Attending: Family Medicine | Admitting: Family Medicine

## 2022-08-16 DIAGNOSIS — M79604 Pain in right leg: Secondary | ICD-10-CM | POA: Diagnosis present

## 2022-08-16 DIAGNOSIS — M79605 Pain in left leg: Secondary | ICD-10-CM | POA: Insufficient documentation

## 2022-08-21 ENCOUNTER — Encounter: Payer: Self-pay | Admitting: Vascular Surgery

## 2022-08-21 ENCOUNTER — Ambulatory Visit (INDEPENDENT_AMBULATORY_CARE_PROVIDER_SITE_OTHER): Payer: Commercial Managed Care - HMO | Admitting: Vascular Surgery

## 2022-08-21 VITALS — BP 153/73 | HR 84 | Temp 98.1°F | Ht 65.0 in | Wt 136.4 lb

## 2022-08-21 DIAGNOSIS — M25562 Pain in left knee: Secondary | ICD-10-CM | POA: Diagnosis not present

## 2022-08-21 DIAGNOSIS — M25561 Pain in right knee: Secondary | ICD-10-CM | POA: Diagnosis not present

## 2022-08-21 NOTE — Progress Notes (Signed)
[facility] [place]  Patient name: Jimmy Craig MRN: [id number] DOB: 06/01/1951 Sex: male  REASON FOR CONSULT: Evaluation bilateral lower extremity leg pain, rule out arterial insufficiency  HPI: Jimmy Craig is a 72 y.o. male, who is here today for discussion of bilateral lower extremity leg pain.  He reports that the pain is in his both legs and extends the entire legs from hips thighs and calves.  He reports that this is most pronounced with walking.  He also reports that if he lifts heavy object that he has similar pain.  He has not had any history of tissue loss.  He does have a history of degenerative disc disease and reports that he had some type of spine surgery approximately 1-1/2 years ago.  He reports that prior to his surgery he had numbness in both lower extremities.  Following surgery he is had resolution of the numbness but reports that he is having pain bilaterally.  Past Medical History:  Diagnosis Date   Arthritis    BPH (benign prostatic hyperplasia)    Chronic back pain    GERD (gastroesophageal reflux disease)    Hypertension    Mixed hyperlipidemia    Type 2 diabetes mellitus (HCC)     Family History  Problem Relation Age of Onset   Diabetes Mother     SOCIAL HISTORY: Social History   Socioeconomic History   Marital status: Married    Spouse name: Not on file   Number of children: Not on file   Years of education: Not on file   Highest education level: Not on file  Occupational History   Not on file  Tobacco Use   Smoking status: Never   Smokeless tobacco: Never  Vaping Use   Vaping Use: Never used  Substance and Sexual Activity   Alcohol use: No   Drug use: No   Sexual activity: Yes    Birth control/protection: None  Other Topics Concern   Not on file  Social History Narrative   Not on file   Social Determinants of Health   Financial Resource Strain: Not on file  Food  Insecurity: Not on file  Transportation Needs: Not on file  Physical Activity: Not on file  Stress: Not on file  Social Connections: Not on file  Intimate Partner Violence: Not on file    No Known Allergies  Current Outpatient Medications  Medication Sig Dispense Refill   albuterol (VENTOLIN HFA) 108 (90 Base) MCG/ACT inhaler Inhale 2 puffs into the lungs every 6 (six) hours as needed for wheezing or shortness of breath. 8 g 2   atorvastatin (LIPITOR) 40 MG tablet Take 1 tablet (40 mg total) by mouth daily. 90 tablet 1   azelastine (OPTIVAR) 0.05 % ophthalmic solution Place 1 drop into both eyes 2 (two) times daily. 6 mL 12   blood glucose meter kit and supplies Dispense based on patient and insurance preference. Use up to four times daily as directed. (FOR ICD-10 E10.9, E11.9). 1 each 0   celecoxib (CELEBREX) 200 MG capsule Take 1 capsule by mouth once daily 90 capsule 0   cetirizine (ZYRTEC) 10 MG tablet Take 1 tablet (10 mg total) by mouth daily. 90 tablet 1   clopidogrel (PLAVIX) 75 MG tablet Take 1 tablet (75 mg total) by mouth daily. 90 tablet 1   empagliflozin (JARDIANCE) 25 MG TABS tablet Take 1 tablet (25 mg total) by mouth daily before breakfast. 90 tablet 1   fluticasone (  FLONASE) 50 MCG/ACT nasal spray Place 2 sprays into both nostrils daily. 16 g 6   fluticasone-salmeterol (ADVAIR) 100-50 MCG/ACT AEPB Inhale 1 puff into the lungs 2 (two) times daily. 1 each 3   gabapentin (NEURONTIN) 600 MG tablet Take 1 tablet (600 mg total) by mouth 4 (four) times daily as needed. 120 tablet 3   glucose blood (ACCU-CHEK GUIDE) test strip Use 1 strip up to 4 times daily.  E10.9, E11.9 100 each 12   Insulin Glargine (BASAGLAR KWIKPEN) 100 UNIT/ML Inject 42 Units into the skin daily. 15 mL 5   Insulin Pen Needle (PEN NEEDLES) 32G X 4 MM MISC 1 application by Does not apply route daily. 100 each 11   Lancets (ONETOUCH DELICA PLUS LANCET33G) MISC Apply 1 each topically 4 (four) times daily.      lisinopril (ZESTRIL) 10 MG tablet Take 1 tablet (10 mg total) by mouth daily. 90 tablet 3   metFORMIN (GLUCOPHAGE) 1000 MG tablet Take 1 tablet (1,000 mg total) by mouth 2 (two) times daily with a meal. 180 tablet 1   Multiple Vitamin (MULTIVITAMIN WITH MINERALS) TABS tablet Take 1 tablet by mouth daily.     olopatadine (PATADAY) 0.1 % ophthalmic solution Place 1 drop into both eyes 2 (two) times daily. 5 mL 12   pantoprazole (PROTONIX) 40 MG tablet Take 1 tablet by mouth once daily 90 tablet 0   polyethylene glycol powder (GLYCOLAX/MIRALAX) 17 GM/SCOOP powder Take 17 g by mouth 2 (two) times daily as needed. 3350 g 1   predniSONE (STERAPRED UNI-PAK 21 TAB) 10 MG (21) TBPK tablet As directed x 6 days 21 tablet 0   tadalafil (CIALIS) 20 MG tablet Take 0.5-1 tablets (10-20 mg total) by mouth every other day as needed for erectile dysfunction. 10 tablet 11   tamsulosin (FLOMAX) 0.4 MG CAPS capsule Take 1 capsule (0.4 mg total) by mouth in the morning and at bedtime. 180 capsule 3   TRULICITY 3 MG/0.5ML SOPN INJECT 3 MG  SUBCUTANEOUSLY ONCE A WEEK 4 mL 0   baclofen (LIORESAL) 10 MG tablet TAKE 1 TABLET BY MOUTH THREE TIMES DAILY (Patient not taking: Reported on 07/18/2022) 60 tablet 0   No current facility-administered medications for this visit.    REVIEW OF SYSTEMS:  [X]  denotes positive finding, [ ]  denotes negative finding Cardiac  Comments:  Chest pain or chest pressure:    Shortness of breath upon exertion:    Short of breath when lying flat:    Irregular heart rhythm:        Vascular    Pain in calf, thigh, or hip brought on by ambulation: x   Pain in feet at night that wakes you up from your sleep:     Blood clot in your veins:    Leg swelling:  x       Pulmonary    Oxygen at home:    Productive cough:     Wheezing:         Neurologic    Sudden weakness in arms or legs:  x   Sudden numbness in arms or legs:  x   Sudden onset of difficulty speaking or slurred speech:     Temporary loss of vision in one eye:     Problems with dizziness:         Gastrointestinal    Blood in stool:     Vomited blood:         Genitourinary    Burning when  urinating:     Blood in urine:        Psychiatric    Major depression:         Hematologic    Bleeding problems:    Problems with blood clotting too easily:        Skin    Rashes or ulcers:        Constitutional    Fever or chills:      PHYSICAL EXAM: Vitals:   08/21/22 0842  BP: (!) 153/73  Pulse: 84  Temp: 98.1 F (36.7 C)  SpO2: 97%  Weight: 136 lb 6.4 oz (61.9 kg)  Height: 5\' 5"  (1.651 m)    GENERAL: The patient is a well-nourished male, in no acute distress. The vital signs are documented above. CARDIOVASCULAR: 2+ radial pulses bilaterally.  2+ femoral pulses bilaterally.  2-3+ popliteal pulses bilaterally.  1+ dorsalis pedis pulses bilaterally PULMONARY: There is good air exchange  MUSCULOSKELETAL: There are no major deformities or cyanosis. NEUROLOGIC: No focal weakness or paresthesias are detected. SKIN: There are no ulcers or rashes noted. PSYCHIATRIC: The patient has a normal affect.  DATA:  Noninvasive studies from 08/16/2022 revealed normal ankle arm index bilaterally and normal triphasic waveforms bilaterally.  MEDICAL ISSUES: I reviewed these findings with patient.  Explained that both by noninvasive vascular studies and by physical exam he does not have any evidence of significant lower extremity arterial insufficiency.  He is quite frustrated regarding his leg pain.  He reports that he has seen orthopedics specialist with hip injections and spine specialist in our vascular specialist and still has pain.  I explained that unfortunately I do not have anything to offer since he has essentially normal arterial flow.  He will see [place] again on an as-needed basis   Rosetta Posner, MD [facility] Tel [phone] Pager [phone]  Note:  Portions of this report may have been transcribed using voice recognition software.  Every effort has been made to ensure accuracy; however, inadvertent computerized transcription errors may still be present.

## 2022-08-24 ENCOUNTER — Other Ambulatory Visit: Payer: Self-pay | Admitting: Family

## 2022-08-24 DIAGNOSIS — E1142 Type 2 diabetes mellitus with diabetic polyneuropathy: Secondary | ICD-10-CM

## 2022-08-28 ENCOUNTER — Telehealth: Payer: Self-pay | Admitting: Family Medicine

## 2022-08-28 DIAGNOSIS — E1142 Type 2 diabetes mellitus with diabetic polyneuropathy: Secondary | ICD-10-CM

## 2022-08-28 NOTE — Telephone Encounter (Signed)
PA sent to plan Your information has been submitted and will be reviewed by [place]. You may close this dialog, return to your dashboard, and perform other tasks. An electronic determination will be received in CoverMyMeds within 72-120 hours. You can see the latest determination by locating this request on your dashboard or by reopening this request. You will receive a fax copy of the determination. If Christella Scheuermann has not responded in 120 hours, contact [employer] at [phone].

## 2022-09-02 NOTE — Telephone Encounter (Signed)
Pt calling to check in status of this PA. He is out of trulicity and jardiance. Pt says that he talked to his insurance and they do not have the PA Can pt can samples? Please call back

## 2022-09-03 MED ORDER — DAPAGLIFLOZIN PROPANEDIOL 10 MG PO TABS: 10.0000 mg | ORAL_TABLET | Freq: Every day | ORAL | 1 refills | Status: DC

## 2022-09-03 MED ORDER — TRULICITY 3 MG/0.5ML ~~LOC~~ SOAJ: SUBCUTANEOUS | 2 refills | Status: DC

## 2022-09-03 NOTE — Addendum Note (Signed)
Addended by: Evelina Dun A on: 09/03/2022 11:30 AM   Modules accepted: Orders

## 2022-09-03 NOTE — Telephone Encounter (Signed)
Patient aware and verbalized understanding.

## 2022-09-03 NOTE — Telephone Encounter (Signed)
Patient calling because he said his insurance has not received anything about Trulicity. Please call back

## 2022-09-03 NOTE — Telephone Encounter (Signed)
Pt needs an appointment with [place] asap!   I have resent Trulicity and changed the Jardiance to [place] 10 mg.

## 2022-09-03 NOTE — Telephone Encounter (Signed)
Plan closed PA. No samples what would you like them to do?

## 2022-09-04 NOTE — Telephone Encounter (Signed)
WAS ON THE PHONE WITH INSURANCE DOING A PA FOR 25 MIN. WAS APPROVED WILL CALL PHARMACY AND PATIENT

## 2022-09-04 NOTE — Telephone Encounter (Signed)
Pharmacy aware. Called patient could not leave vm

## 2022-09-26 ENCOUNTER — Other Ambulatory Visit: Payer: Self-pay | Admitting: Family

## 2022-09-29 ENCOUNTER — Other Ambulatory Visit: Payer: Self-pay | Admitting: Family

## 2022-10-03 ENCOUNTER — Encounter: Payer: Self-pay | Admitting: Radiology

## 2022-11-21 ENCOUNTER — Ambulatory Visit: Payer: Commercial Managed Care - HMO | Admitting: Family

## 2022-11-26 ENCOUNTER — Other Ambulatory Visit: Payer: Self-pay | Admitting: Family

## 2022-11-30 ENCOUNTER — Other Ambulatory Visit: Payer: Self-pay | Admitting: Family

## 2022-11-30 DIAGNOSIS — E1142 Type 2 diabetes mellitus with diabetic polyneuropathy: Secondary | ICD-10-CM

## 2022-12-05 ENCOUNTER — Encounter: Payer: Self-pay | Admitting: Family

## 2022-12-05 ENCOUNTER — Ambulatory Visit (INDEPENDENT_AMBULATORY_CARE_PROVIDER_SITE_OTHER): Payer: Commercial Managed Care - HMO | Admitting: Family

## 2022-12-05 VITALS — BP 116/63 | HR 83 | Temp 97.8°F | Ht 65.0 in | Wt 139.0 lb

## 2022-12-05 DIAGNOSIS — E785 Hyperlipidemia, unspecified: Secondary | ICD-10-CM

## 2022-12-05 DIAGNOSIS — M48061 Spinal stenosis, lumbar region without neurogenic claudication: Secondary | ICD-10-CM

## 2022-12-05 DIAGNOSIS — Z0001 Encounter for general adult medical examination with abnormal findings: Secondary | ICD-10-CM

## 2022-12-05 DIAGNOSIS — J449 Chronic obstructive pulmonary disease, unspecified: Secondary | ICD-10-CM | POA: Diagnosis not present

## 2022-12-05 DIAGNOSIS — N401 Enlarged prostate with lower urinary tract symptoms: Secondary | ICD-10-CM

## 2022-12-05 DIAGNOSIS — J301 Allergic rhinitis due to pollen: Secondary | ICD-10-CM | POA: Diagnosis not present

## 2022-12-05 DIAGNOSIS — K219 Gastro-esophageal reflux disease without esophagitis: Secondary | ICD-10-CM

## 2022-12-05 DIAGNOSIS — H33301 Unspecified retinal break, right eye: Secondary | ICD-10-CM

## 2022-12-05 DIAGNOSIS — Z794 Long term (current) use of insulin: Secondary | ICD-10-CM

## 2022-12-05 DIAGNOSIS — K5900 Constipation, unspecified: Secondary | ICD-10-CM

## 2022-12-05 DIAGNOSIS — E1159 Type 2 diabetes mellitus with other circulatory complications: Secondary | ICD-10-CM | POA: Diagnosis not present

## 2022-12-05 DIAGNOSIS — E1169 Type 2 diabetes mellitus with other specified complication: Secondary | ICD-10-CM

## 2022-12-05 DIAGNOSIS — E113413 Type 2 diabetes mellitus with severe nonproliferative diabetic retinopathy with macular edema, bilateral: Secondary | ICD-10-CM

## 2022-12-05 DIAGNOSIS — I152 Hypertension secondary to endocrine disorders: Secondary | ICD-10-CM

## 2022-12-05 DIAGNOSIS — M159 Polyosteoarthritis, unspecified: Secondary | ICD-10-CM

## 2022-12-05 DIAGNOSIS — Z0000 Encounter for general adult medical examination without abnormal findings: Secondary | ICD-10-CM

## 2022-12-05 DIAGNOSIS — M150 Primary generalized (osteo)arthritis: Secondary | ICD-10-CM

## 2022-12-05 DIAGNOSIS — E1142 Type 2 diabetes mellitus with diabetic polyneuropathy: Secondary | ICD-10-CM | POA: Diagnosis not present

## 2022-12-05 DIAGNOSIS — D509 Iron deficiency anemia, unspecified: Secondary | ICD-10-CM

## 2022-12-05 DIAGNOSIS — G8929 Other chronic pain: Secondary | ICD-10-CM

## 2022-12-05 LAB — BAYER DCA HB A1C WAIVED: HB A1C (BAYER DCA - WAIVED): 7.6 % — ABNORMAL HIGH (ref 4.8–5.6)

## 2022-12-05 MED ORDER — HYDROCODONE-ACETAMINOPHEN 5-325 MG PO TABS: 1.0000 | ORAL_TABLET | Freq: Two times a day (BID) | ORAL | 0 refills | Status: DC | PRN

## 2022-12-05 MED ORDER — LEVOCETIRIZINE DIHYDROCHLORIDE 5 MG PO TABS: 5.0000 mg | ORAL_TABLET | Freq: Every evening | ORAL | 1 refills | Status: DC

## 2022-12-05 MED ORDER — METFORMIN HCL 1000 MG PO TABS: 1000.0000 mg | ORAL_TABLET | Freq: Two times a day (BID) | ORAL | 0 refills | Status: DC

## 2022-12-05 MED ORDER — DAPAGLIFLOZIN PROPANEDIOL 10 MG PO TABS: 10.0000 mg | ORAL_TABLET | Freq: Every day | ORAL | 1 refills | Status: DC

## 2022-12-05 NOTE — Progress Notes (Signed)
Subjective:    Patient ID: Jimmy Craig, male    DOB: 1951/04/27, 72 y.o.   MRN: [id number]  Chief Complaint  Patient presents with   Medical Management of Chronic Issues    Hip and joint pain, dry mouth     Pt presents to the office today for CPE and chronic follow up. He has chronic back pain and has seen Neurosurgeon in the past. He had lumbar surgery on  11/22/20.  He had a MRI in 01/2020. He reports his leg pain is a pain of 9.5 out 10 when walking but resolves when resting. Requesting referral to Pain Management.    He is followed by Ortho and had a steroid injection in left hip that did not help.    He has COPD with intermittent SOB and congestion. States the congestion is worsening and the albuterol is not working.    States the zyrtec is not helping and requesting medication changes.  Hypertension This is a chronic problem. The current episode started more than 1 year ago. The problem has been resolved since onset. The problem is controlled. Associated symptoms include blurred vision. Pertinent negatives include no malaise/fatigue, peripheral edema ("some times") or shortness of breath. Risk factors for coronary artery disease include dyslipidemia, diabetes mellitus, male gender and sedentary lifestyle. The current treatment provides moderate improvement.  Diabetes He presents for his follow-up diabetic visit. He has type 2 diabetes mellitus. Associated symptoms include blurred vision and foot paresthesias. Symptoms are stable. Risk factors for coronary artery disease include dyslipidemia, diabetes mellitus, hypertension and male sex. He is following a generally healthy diet. His overall blood glucose range is 140-180 mg/dl. Eye exam is not current.  Gastroesophageal Reflux He complains of belching and heartburn. This is a chronic problem. The current episode started more than 1 year ago. The problem occurs occasionally. He has tried a PPI for the symptoms. The treatment provided  moderate relief.  Hyperlipidemia This is a chronic problem. The current episode started more than 1 year ago. The problem is controlled. Pertinent negatives include no shortness of breath. Current antihyperlipidemic treatment includes statins. The current treatment provides moderate improvement of lipids. Risk factors for coronary artery disease include dyslipidemia, diabetes mellitus, hypertension and a sedentary lifestyle.  Back Pain This is a chronic problem. The current episode started more than 1 year ago. The problem occurs intermittently. The pain is present in the lumbar spine. The quality of the pain is described as aching. The pain is at a severity of 8/10. The pain is moderate. The symptoms are aggravated by bending and standing. He has tried NSAIDs for the symptoms. The treatment provided mild relief.  Arthritis Presents for follow-up visit. He complains of pain and stiffness. Affected locations include the left knee, right knee, right MCP and left MCP. His pain is at a severity of 7/10.  Benign Prostatic Hypertrophy This is a chronic problem. The current episode started more than 1 year ago. The problem has been resolved since onset. Irritative symptoms include nocturia (3-4). The treatment provided mild relief.  Anemia Presents for follow-up visit. There has been no malaise/fatigue.  Constipation This is a chronic problem. The current episode started more than 1 year ago. His stool frequency is 2 to 3 times per week. Associated symptoms include back pain. The treatment provided moderate relief.      Review of Systems  Constitutional:  Negative for malaise/fatigue.  Eyes:  Positive for blurred vision.  Respiratory:  Negative for shortness of breath.  Gastrointestinal:  Positive for constipation and heartburn.  Genitourinary:  Positive for nocturia (3-4).  Musculoskeletal:  Positive for arthritis, back pain and stiffness.  All other systems reviewed and are negative.  Family  History  Problem Relation Age of Onset   Diabetes Mother    Social History   Socioeconomic History   Marital status: Married    Spouse name: Not on file   Number of children: Not on file   Years of education: Not on file   Highest education level: Master's degree (e.g., MA, MS, MEng, MEd, MSW, MBA)  Occupational History   Not on file  Tobacco Use   Smoking status: Never   Smokeless tobacco: Never  Vaping Use   Vaping Use: Never used  Substance and Sexual Activity   Alcohol use: No   Drug use: No   Sexual activity: Yes    Birth control/protection: None  Other Topics Concern   Not on file  Social History Narrative   Not on file   Social Determinants of Health   Financial Resource Strain: Medium Risk (12/03/2022)   Overall Financial Resource Strain (CARDIA)    Difficulty of Paying Living Expenses: Somewhat hard  Food Insecurity: No Food Insecurity (12/03/2022)   Hunger Vital Sign    Worried About Running Out of Food in the Last Year: Never true    Ran Out of Food in the Last Year: Never true  Transportation Needs: No Transportation Needs (12/03/2022)   PRAPARE - Administrator, Civil Service (Medical): No    Lack of Transportation (Non-Medical): No  Physical Activity: Insufficiently Active (12/03/2022)   Exercise Vital Sign    Days of Exercise per Week: 3 days    Minutes of Exercise per Session: 10 min  Stress: Stress Concern Present (12/03/2022)   [employer] of Occupational Health - Occupational Stress Questionnaire    Feeling of Stress : To some extent  Social Connections: Moderately Integrated (12/03/2022)   Social Connection and Isolation Panel [NHANES]    Frequency of Communication with Friends and Family: Three times a week    Frequency of Social Gatherings with Friends and Family: Three times a week    Attends Religious Services: 1 to 4 times per year    Active Member of Clubs or Organizations: No    Attends Banker Meetings: Not on  file    Marital Status: Married       Objective:   Physical Exam Vitals reviewed.  Constitutional:      General: He is not in acute distress.    Appearance: He is well-developed.  HENT:     Head: Normocephalic.     Right Ear: Tympanic membrane normal.     Left Ear: Tympanic membrane normal.  Eyes:     General:        Right eye: No discharge.        Left eye: No discharge.     Pupils: Pupils are equal, round, and reactive to light.  Neck:     Thyroid: No thyromegaly.  Cardiovascular:     Rate and Rhythm: Normal rate and regular rhythm.     Heart sounds: Normal heart sounds. No murmur heard. Pulmonary:     Effort: Pulmonary effort is normal. No respiratory distress.     Breath sounds: Normal breath sounds. No wheezing.  Abdominal:     General: Bowel sounds are normal. There is no distension.     Palpations: Abdomen is soft.  Tenderness: There is no abdominal tenderness.  Musculoskeletal:        General: No tenderness.     Cervical back: Normal range of motion and neck supple.     Comments: Pain in lumbar with flexion and extension   Skin:    General: Skin is warm and dry.     Findings: No erythema or rash.  Neurological:     Mental Status: He is alert and oriented to person, place, and time.     Cranial Nerves: No cranial nerve deficit.     Deep Tendon Reflexes: Reflexes are normal and symmetric.  Psychiatric:        Behavior: Behavior normal.        Thought Content: Thought content normal.        Judgment: Judgment normal.     BP 116/63   Pulse 83   Temp 97.8 F (36.6 C) (Temporal)   Ht 5\' 5"  (1.651 m)   Wt 139 lb (63 kg)   SpO2 97%   PF 45 L/min   BMI 23.13 kg/m      Assessment & Plan:  Khylin Gutridge comes in today with chief complaint of Medical Management of Chronic Issues (Hip and joint pain, dry mouth )   Diagnosis and orders addressed:  1. Type 2 diabetes mellitus with diabetic polyneuropathy, with long-term current use of insulin (HCC) -  dapagliflozin propanediol (FARXIGA) 10 MG TABS tablet; Take 1 tablet (10 mg total) by mouth daily.  Dispense: 90 tablet; Refill: 1 - metFORMIN (GLUCOPHAGE) 1000 MG tablet; Take 1 tablet (1,000 mg total) by mouth 2 (two) times daily with a meal.  Dispense: 180 tablet; Refill: 0 - Ambulatory referral to Ophthalmology - CMP14+EGFR - CBC with Differential/Platelet - Lipid panel - TSH - PSA, total and free - Bayer DCA Hb A1c Waived  2. Annual physical exam - CMP14+EGFR - CBC with Differential/Platelet - Bayer DCA Hb A1c Waived  3. Benign prostatic hyperplasia with nocturia - CMP14+EGFR - CBC with Differential/Platelet - PSA, total and free  4. Chronic obstructive pulmonary disease, unspecified COPD type (HCC) - CMP14+EGFR - CBC with Differential/Platelet  5. Spinal stenosis of lumbar region, unspecified whether neurogenic claudication presen - Ambulatory referral to Pain Clinic - HYDROcodone-acetaminophen (NORCO) 5-325 MG tablet; Take 1 tablet by mouth every 12 (twelve) hours as needed for moderate pain.  Dispense: 60 tablet; Refill: 0 - HYDROcodone-acetaminophen (NORCO) 5-325 MG tablet; Take 1 tablet by mouth every 12 (twelve) hours as needed for moderate pain.  Dispense: 60 tablet; Refill: 0 - HYDROcodone-acetaminophen (NORCO) 5-325 MG tablet; Take 1 tablet by mouth every 12 (twelve) hours as needed for moderate pain.  Dispense: 60 tablet; Refill: 0 - CMP14+EGFR - CBC with Differential/Platelet  6. Primary osteoarthritis involving multiple joints  - Ambulatory referral to Pain Clinic - HYDROcodone-acetaminophen (NORCO) 5-325 MG tablet; Take 1 tablet by mouth every 12 (twelve) hours as needed for moderate pain.  Dispense: 60 tablet; Refill: 0 - HYDROcodone-acetaminophen (NORCO) 5-325 MG tablet; Take 1 tablet by mouth every 12 (twelve) hours as needed for moderate pain.  Dispense: 60 tablet; Refill: 0 - HYDROcodone-acetaminophen (NORCO) 5-325 MG tablet; Take 1 tablet by mouth every 12  (twelve) hours as needed for moderate pain.  Dispense: 60 tablet; Refill: 0 - CMP14+EGFR - CBC with Differential/Platelet  7. Chronic low back pain with left-sided sciatica, unspecified back pain laterality - Ambulatory referral to Pain Clinic - HYDROcodone-acetaminophen (NORCO) 5-325 MG tablet; Take 1 tablet by mouth every 12 (twelve) hours  as needed for moderate pain.  Dispense: 60 tablet; Refill: 0 - HYDROcodone-acetaminophen (NORCO) 5-325 MG tablet; Take 1 tablet by mouth every 12 (twelve) hours as needed for moderate pain.  Dispense: 60 tablet; Refill: 0 - HYDROcodone-acetaminophen (NORCO) 5-325 MG tablet; Take 1 tablet by mouth every 12 (twelve) hours as needed for moderate pain.  Dispense: 60 tablet; Refill: 0 - CMP14+EGFR - CBC with Differential/Platelet  8. Iron deficiency anemia, unspecified iron deficiency anemia type - CMP14+EGFR - CBC with Differential/Platelet  9. Hypertension associated with diabetes (HCC) - CMP14+EGFR - CBC with Differential/Platelet  10. Hyperlipidemia associated with type 2 diabetes mellitus (HCC) - CMP14+EGFR - CBC with Differential/Platelet  11. GERD without esophagitis  - CMP14+EGFR - CBC with Differential/Platelet  12. Constipation, unspecified constipation type - CMP14+EGFR - CBC with Differential/Platelet  13. Allergic rhinitis due to pollen, unspecified seasonality Stop zyrtec and start Xyzal  - levocetirizine (XYZAL ALLERGY 24HR) 5 MG tablet; Take 1 tablet (5 mg total) by mouth every evening.  Dispense: 90 tablet; Refill: 1 - CMP14+EGFR - CBC with Differential/Platelet  14. 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 Retinal hole or tear, right - Ambulatory referral to Ophthalmology - CMP14+EGFR - CBC with Differential/Platelet   Labs pending Given increase  pain will start Norco 5-325 mg, referral to Pain Clinic pending Health Maintenance reviewed Diet and exercise encouraged  Follow up plan: 3 months    .Jannifer Rodney, FNP

## 2022-12-05 NOTE — Patient Instructions (Signed)

## 2022-12-06 LAB — CBC WITH DIFFERENTIAL/PLATELET
Basophils Absolute: 0.1 10*3/uL (ref 0.0–0.2)
Basos: 1 %
EOS (ABSOLUTE): 0.2 10*3/uL (ref 0.0–0.4)
Eos: 3 %
Hematocrit: 41 % (ref 37.5–51.0)
Hemoglobin: 13.2 g/dL (ref 13.0–17.7)
Immature Grans (Abs): 0 10*3/uL (ref 0.0–0.1)
Immature Granulocytes: 1 %
Lymphocytes Absolute: 1.9 10*3/uL (ref 0.7–3.1)
Lymphs: 35 %
MCH: 28.2 pg (ref 26.6–33.0)
MCHC: 32.2 g/dL (ref 31.5–35.7)
MCV: 88 fL (ref 79–97)
Monocytes Absolute: 0.5 10*3/uL (ref 0.1–0.9)
Monocytes: 9 %
Neutrophils Absolute: 2.9 10*3/uL (ref 1.4–7.0)
Neutrophils: 51 %
Platelets: 274 10*3/uL (ref 150–450)
RBC: 4.68 x10E6/uL (ref 4.14–5.80)
RDW: 13.9 % (ref 11.6–15.4)
WBC: 5.5 10*3/uL (ref 3.4–10.8)

## 2022-12-06 LAB — CMP14+EGFR
ALT: 26 IU/L (ref 0–44)
AST: 29 IU/L (ref 0–40)
Albumin/Globulin Ratio: 1.6 (ref 1.2–2.2)
Albumin: 4.3 g/dL (ref 3.9–4.9)
Alkaline Phosphatase: 93 IU/L (ref 44–121)
BUN/Creatinine Ratio: 28 — ABNORMAL HIGH (ref 10–24)
BUN: 23 mg/dL (ref 8–27)
Bilirubin Total: 0.2 mg/dL (ref 0.0–1.2)
CO2: 22 mmol/L (ref 20–29)
Calcium: 9.8 mg/dL (ref 8.6–10.2)
Chloride: 99 mmol/L (ref 96–106)
Creatinine, Ser: 0.83 mg/dL (ref 0.76–1.27)
Globulin, Total: 2.7 g/dL (ref 1.5–4.5)
Glucose: 183 mg/dL — ABNORMAL HIGH (ref 70–99)
Potassium: 4.4 mmol/L (ref 3.5–5.2)
Sodium: 139 mmol/L (ref 134–144)
Total Protein: 7 g/dL (ref 6.0–8.5)
eGFR: 95 mL/min/{1.73_m2} (ref >59)

## 2022-12-06 LAB — LIPID PANEL
Chol/HDL Ratio: 6 ratio — ABNORMAL HIGH (ref 0.0–5.0)
Cholesterol, Total: 233 mg/dL — ABNORMAL HIGH (ref 100–199)
HDL: 39 mg/dL — ABNORMAL LOW (ref >39)
LDL Chol Calc (NIH): 138 mg/dL — ABNORMAL HIGH (ref 0–99)
Triglycerides: 309 mg/dL — ABNORMAL HIGH (ref 0–149)
VLDL Cholesterol Cal: 56 mg/dL — ABNORMAL HIGH (ref 5–40)

## 2022-12-06 LAB — PSA, TOTAL AND FREE
PSA, Free Pct: 30 %
PSA, Free: 0.09 ng/mL
Prostate Specific Ag, Serum: 0.3 ng/mL (ref 0.0–4.0)

## 2022-12-06 LAB — TSH: TSH: 3.32 u[IU]/mL (ref 0.450–4.500)

## 2022-12-09 ENCOUNTER — Other Ambulatory Visit: Payer: Self-pay | Admitting: Family

## 2022-12-09 ENCOUNTER — Telehealth: Payer: Self-pay | Admitting: Family

## 2022-12-09 ENCOUNTER — Encounter: Payer: Self-pay | Admitting: Physical Medicine & Rehabilitation

## 2022-12-09 DIAGNOSIS — E1142 Type 2 diabetes mellitus with diabetic polyneuropathy: Secondary | ICD-10-CM

## 2022-12-09 DIAGNOSIS — E1159 Type 2 diabetes mellitus with other circulatory complications: Secondary | ICD-10-CM

## 2022-12-09 MED ORDER — TRULICITY 4.5 MG/0.5ML ~~LOC~~ SOAJ
4.5000 mg | SUBCUTANEOUS | 2 refills | Status: DC
Start: 2022-12-09 — End: 2023-01-03

## 2022-12-09 NOTE — Telephone Encounter (Signed)
Called and spoke to patient wants blood pressure monitor sent to [facility] . Ordered will get [facility] to sign

## 2022-12-09 NOTE — Telephone Encounter (Signed)
Patient calling to check on results from lab work on 5/2, aware that we are waiting for the provider to review. He also has questions about a couple of medications that were discussed at his appointment on 5/2, he was unsure of the names of medications. One was for blood pressure. Please call back and advise.

## 2022-12-10 ENCOUNTER — Telehealth: Payer: Self-pay

## 2022-12-10 NOTE — Telephone Encounter (Signed)
Jimmy Craig (Key: [id number]) Need Help? Call us at [phone] Status sent iconSent to Plan today Drug HYDROcodone-Acetaminophen 5-325MG  tablets ePA cloud Pension scheme manager PA Form [phone] NCPDP)

## 2022-12-11 ENCOUNTER — Other Ambulatory Visit (HOSPITAL_COMMUNITY): Payer: Self-pay

## 2022-12-11 NOTE — Telephone Encounter (Signed)
Patient Advocate Encounter  Prior Authorization for HYDROcodone-Acetaminophen 5-325MG  tablets has been approved with [employer].    PA# [id number] Effective dates: 12/10/22 through 12/10/23  Per [facility] test claim, copay for 30 days supply is $19.92

## 2022-12-12 ENCOUNTER — Telehealth: Payer: Self-pay | Admitting: Family

## 2022-12-12 NOTE — Telephone Encounter (Signed)
Patient calling because he was told by his pharmacy that a PA needs to be started fot  Dulaglutide (TRULICITY) 4.5 MG/0.5ML SOPN

## 2022-12-13 ENCOUNTER — Other Ambulatory Visit (HOSPITAL_COMMUNITY): Payer: Self-pay

## 2022-12-13 ENCOUNTER — Telehealth: Payer: Self-pay

## 2022-12-13 NOTE — Telephone Encounter (Signed)
LMTCB

## 2022-12-13 NOTE — Telephone Encounter (Signed)
PA APPROVED  

## 2022-12-13 NOTE — Telephone Encounter (Signed)
PA submitted. Created new encounter for PA. Will update and route back to pool once determination has been made.  

## 2022-12-13 NOTE — Telephone Encounter (Signed)
Pharmacy Patient Advocate Encounter   Received notification from [facility] that prior authorization for Trulicity 4.5MG /0.5ML pen-injectors is required/requested.   PA submitted on 12/13/22 to (ins) Child psychotherapist via [employer] or [facility]) confirmation # [id number] Status is pending

## 2022-12-20 ENCOUNTER — Encounter: Payer: Self-pay | Admitting: Physical Medicine & Rehabilitation

## 2022-12-20 ENCOUNTER — Encounter
Payer: Commercial Managed Care - HMO | Attending: Physical Medicine & Rehabilitation | Admitting: Physical Medicine & Rehabilitation

## 2022-12-20 VITALS — BP 121/66 | HR 94 | Ht 65.0 in | Wt 135.0 lb

## 2022-12-20 DIAGNOSIS — M48062 Spinal stenosis, lumbar region with neurogenic claudication: Secondary | ICD-10-CM

## 2022-12-20 MED ORDER — LIDOCAINE HCL 1 % IJ SOLN
5.0000 mL | Freq: Once | INTRAMUSCULAR | Status: AC
  Administered 2022-12-20: 5 mL

## 2022-12-20 MED ORDER — PREGABALIN 75 MG PO CAPS: 75.0000 mg | ORAL_CAPSULE | Freq: Three times a day (TID) | ORAL | 1 refills | Status: DC

## 2022-12-20 MED ORDER — BETAMETHASONE SOD PHOS & ACET 6 (3-3) MG/ML IJ SUSP
6.0000 mg | Freq: Once | INTRAMUSCULAR | Status: AC
Start: 2022-12-20 — End: 2022-12-20
  Administered 2022-12-20: 6 mg via INTRAMUSCULAR

## 2022-12-20 NOTE — Patient Instructions (Signed)
Hip Bursitis Rehab Ask your health care provider which exercises are safe for you. Do exercises exactly as told by your health care provider and adjust them as directed. It is normal to feel mild stretching, pulling, tightness, or discomfort as you do these exercises. Stop right away if you feel sudden pain or your pain gets worse. Do not begin these exercises until told by your health care provider. Stretching exercise This exercise warms up your muscles and joints and improves the movement and flexibility of your hip. This exercise also helps to relieve pain and stiffness. Iliotibial band stretch An iliotibial band is a strong band of muscle tissue that runs from the outer side of your hip to the outer side of your thigh and knee. Lie on your side with your left / right leg in the top position. Bend your left / right knee and grab your ankle. Stretch out your bottom arm to help you balance. Slowly bring your knee back so your thigh is slightly behind your body. Slowly lower your knee toward the floor until you feel a gentle stretch on the outside of your left / right thigh. If you do not feel a stretch and your knee will not lower more toward the floor, place the heel of your other foot on top of your knee and pull your knee down toward the floor with your foot. Hold this position for __________ seconds. Slowly return to the starting position. Repeat __________ times. Complete this exercise __________ times a day. Strengthening exercises These exercises build strength and endurance in your hip and pelvis. Endurance is the ability to use your muscles for a long time, even after they get tired. Bridge This exercise strengthens the muscles that move your thigh backward (hip extensors). Lie on your back on a firm surface with your knees bent and your feet flat on the floor. Tighten your buttocks muscles and lift your buttocks off the floor until your trunk is level with your thighs. Do not arch your  back. You should feel the muscles working in your buttocks and the back of your thighs. If you do not feel these muscles, slide your feet 1-2 inches (2.5-5 cm) farther away from your buttocks. If this exercise is too easy, try doing it with your arms crossed over your chest. Hold this position for __________ seconds. Slowly lower your hips to the starting position. Let your muscles relax completely after each repetition. Repeat __________ times. Complete this exercise __________ times a day. Squats This exercise strengthens the muscles in front of your thigh and knee (quadriceps). Stand in front of a table, with your feet and knees pointing straight ahead. You may rest your hands on the table for balance but not for support. Slowly bend your knees and lower your hips like you are going to sit in a chair. Keep your weight over your heels, not over your toes. Keep your lower legs upright so they are parallel with the table legs. Do not let your hips go lower than your knees. Do not bend lower than told by your health care provider. If your hip pain increases, do not bend as low. Hold the squat position for __________ seconds. Slowly push with your legs to return to standing. Do not use your hands to pull yourself to standing. Repeat __________ times. Complete this exercise __________ times a day. Hip hike  Stand sideways on a bottom step. Stand on your left / right leg with your other foot unsupported next to   the step. You can hold on to the railing or wall for balance if needed. Keep your knees straight and your torso square. Then lift your left / right hip up toward the ceiling. Hold this position for __________ seconds. Slowly let your left / right hip lower toward the floor, past the starting position. Your foot should get closer to the floor. Do not lean or bend your knees. Repeat __________ times. Complete this exercise __________ times a day. Single leg stand This exercise increases  your balance. Without shoes, stand near a railing or in a doorway. You may hold on to the railing or door frame as needed for balance. Squeeze your left / right buttock muscles, then lift up your other foot. Do not let your left / right hip push out to the side. It is helpful to stand in front of a mirror for this exercise so you can watch your hip. Hold this position for __________ seconds. Repeat __________ times. Complete this exercise __________ times a day. This information is not intended to replace advice given to you by your health care provider. Make sure you discuss any questions you have with your health care provider. Document Revised: 07/04/2021 Document Reviewed: 07/04/2021 Elsevier Patient Education  2023 [employer] Inc.  

## 2022-12-20 NOTE — Progress Notes (Signed)
Subjective:    Patient ID: Jimmy Craig, male    DOB: 1950/11/16, 72 y.o.   MRN: [id number]  HPI 72 yo male with hx of diabetes with retinopathy and lumbar spinal stenosis.  The patient underwent lumbar decompressive surgery with Dr. Delma Officer referred for back and  lower ext pain with primary care physician.  The patient has been taking gabapentin 600 mg twice daily for this problem his pain is rated as 5 out of 10 on average.  Dull and aching.  Pain is worse with bending.  He also has some hip pain on the left side.  He has had MRI of the hip last year. 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 No intravenous contrast was administered.   COMPARISON:  X-ray hip 07/03/2021.   FINDINGS: Bones: There is no evidence of acute fracture, dislocation or avascular necrosis. No focal bone lesion. The visualized sacroiliac joints and symphysis pubis appear normal.   Articular cartilage and labrum   Articular cartilage: Mild partial-thickness cartilage loss in both hip joints with small subchondral cysts in both acetabular roofs.   Labrum: Fraying and small tear of the left posterosuperior labrum (series 12, image 13). Probable small tear of the right superior labrum (series 9, image 19). No paralabral abnormality.   Joint or bursal effusion   Joint effusion: No significant hip joint effusion.   Bursae: No focal periarticular fluid collection.   Muscles and tendons   Muscles and tendons: The visualized gluteus, hamstring and iliopsoas tendons appear normal. No muscle edema or atrophy.   Other findings   Miscellaneous: The visualized internal pelvic contents appear unremarkable.   IMPRESSION: 1. No trochanteric bursitis. 2. Mild bilateral hip osteoarthritis. 3.  Fraying and small tear of the left posterosuperior labrum. 4. Probable small tear of the right superior labrum.     Electronically Signed   By: Obie Dredge M.D.   On: 09/06/2021 09:32 MRI LUMBAR SPINE WITHOUT CONTRAST   TECHNIQUE: Multiplanar, multisequence MR imaging of the lumbar spine was performed. No intravenous contrast was administered.   COMPARISON:  Lumbar radiographs 12/07/2019.   FINDINGS: Segmentation:  Normal on the comparison.   Alignment: Preserved lumbar lordosis. Subtle anterolisthesis at L4-L5.   Vertebrae: No marrow edema or evidence of acute osseous abnormality. Chronic degenerative endplate signal changes at L5-S1. Normal background bone marrow signal. Intact visible sacrum and SI joints.   Conus medullaris and cauda equina: Conus extends to the T12-L1 level. No lower spinal cord or conus signal abnormality.   Paraspinal and other soft tissues: Negative.   Disc levels:   T11-T12 through L1-L2: Negative.   L2-L3: Disc space loss with circumferential disc bulge and superimposed broad-based moderate size central disc extrusion with mild lobulation (series 5, image 8). Mild posterior element hypertrophy and epidural lipomatosis. Moderate spinal and left lateral recess stenosis (left L3 nerve level). Mild left L2 foraminal stenosis.   L3-L4: Circumferential but mostly far lateral disc bulging with mild to moderate posterior element hypertrophy. Mild spinal stenosis. Borderline to mild foraminal stenosis greater on the right.   L4-L5: Bulky circumferential disc bulge or protrusion. Severe posterior element degeneration including ligament flavum hypertrophy. Trace degenerative facet joint fluid on the right. Severe spinal and lateral recess stenosis (bilateral L5 nerve levels).  Moderate right L4 foraminal stenosis. Borderline to mild on the left.   L5-S1: Disc space loss with vacuum disc. Circumferential disc osteophyte complex and mild to  moderate posterior element hypertrophy. No spinal stenosis. Mild to moderate lateral recess stenosis greater on the right (S1 nerve levels). Moderate left L5 foraminal stenosis.   IMPRESSION: 1. Severe multifactorial spinal and lateral recess stenosis at L4-L5 with moderate right foraminal stenosis. Query L5 radiculitis. 2. Moderate spinal and left lateral recess stenosis at L2-L3 related to central disc herniation. Mild multifactorial spinal stenosis at L3-L4. 3. 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     Electronically Signed   By: Odessa Fleming M.D.   On: 01/20/2020 13:17 Pain Inventory Average Pain 8 Pain Right Now 8 My pain is sharp  In the last 24 hours, has pain interfered with the following? General activity 7 Relation with others 7 Enjoyment of life 7 What TIME of day is your pain at its worst? daytime Sleep (in general) Fair  Pain is worse with: walking and standing Pain improves with: rest Relief from Meds: 5  walk without assistance how many minutes can you walk? 10-15 ability to climb steps?  no do you drive?  yes Do you have any goals in this area?  yes  employed # of hrs/week 30 I need assistance with the following:  shopping Do you have any goals in this area?  yes  weakness tingling trouble walking spasms  Any changes since last visit?  no  Any changes since last visit?  no    Family History  Problem Relation Age of Onset   Diabetes Mother    Social History   Socioeconomic History   Marital status: Married    Spouse name: Not on file   Number of children: Not on file   Years of education: Not on file   Highest education level: Master's degree (e.g., MA, MS, MEng, MEd, MSW, MBA)  Occupational History   Not on file  Tobacco Use   Smoking status: Never   Smokeless tobacco: Never  Vaping Use   Vaping Use: Never used  Substance and Sexual Activity   Alcohol use: No    Drug use: No   Sexual activity: Yes    Birth control/protection: None  Other Topics Concern   Not on file  Social History Narrative   Not on file   Social Determinants of Health   Financial Resource Strain: Medium Risk (12/03/2022)   Overall Financial Resource Strain (CARDIA)    Difficulty of Paying Living Expenses: Somewhat hard  Food Insecurity: No Food Insecurity (12/03/2022)   Hunger Vital Sign    Worried About Running Out of Food in the Last Year: Never true    Ran Out of Food in the Last Year: Never true  Transportation Needs: No Transportation Needs (12/03/2022)   PRAPARE - Administrator, Civil Service (Medical): No    Lack of Transportation (Non-Medical): No  Physical Activity: Insufficiently Active (12/03/2022)   Exercise Vital Sign    Days of Exercise per Week: 3 days    Minutes of Exercise per Session: 10 min  Stress: Stress Concern Present (12/03/2022)   [employer] of Occupational Health - Occupational Stress Questionnaire    Feeling of Stress : To some extent  Social Connections: Moderately Integrated (12/03/2022)   Social Connection and Isolation Panel [NHANES]    Frequency of Communication with Friends and Family: Three times a  week    Frequency of Social Gatherings with Friends and Family: Three times a week    Attends Religious Services: 1 to 4 times per year    Active Member of Clubs or Organizations: No    Attends Engineer, structural: Not on file    Marital Status: Married   Past Surgical History:  Procedure Laterality Date   CATARACT EXTRACTION Right    CATARACT EXTRACTION W/PHACO Left 12/20/2013   Procedure: CATARACT EXTRACTION PHACO AND INTRAOCULAR LENS PLACEMENT (IOC);  Surgeon: Susa Simmonds, MD;  Location: [facility];  Service: Ophthalmology;  Laterality: Left;  CDE: 0.97   Past Medical History:  Diagnosis Date   Arthritis    BPH (benign prostatic hyperplasia)    Chronic back pain    GERD (gastroesophageal reflux disease)     Hypertension    Mixed hyperlipidemia    Type 2 diabetes mellitus (HCC)    BP 121/66   Pulse 94   Ht 5\' 5"  (1.651 m)   Wt 135 lb (61.2 kg)   SpO2 96%   BMI 22.47 kg/m   Opioid Risk Score:   Fall Risk Score:  `1  Depression screen PHQ 2/9     08/13/2022    1:41 PM 05/13/2022    3:23 PM 01/29/2022   11:05 AM 10/22/2021    1:49 PM 04/23/2021    2:02 PM 01/08/2021    2:48 PM 12/26/2020   12:05 PM  Depression screen PHQ 2/9  Decreased Interest 0 0 0 0 0 0 0  Down, Depressed, Hopeless 0 0 0 0 0 0 0  PHQ - 2 Score 0 0 0 0 0 0 0  Altered sleeping 0  0  0 0 0  Tired, decreased energy 0  2  1 0 1  Change in appetite 0  0  0 0 0  Feeling bad or failure about yourself  0  0  0 0 0  Trouble concentrating 0  1   0 1  Moving slowly or fidgety/restless 0  0  0 0   Suicidal thoughts 0  0  0 0 0  PHQ-9 Score 0  3  1 0 2  Difficult doing work/chores Not difficult at all  Somewhat difficult  Not difficult at all Not difficult at all Somewhat difficult      Review of Systems  Musculoskeletal:  Positive for back pain and gait problem.  All other systems reviewed and are negative.     Objective:   Physical Exam General no acute distress At mood and affect are appropriate Negative straight leg raising bilaterally Pain with palpation over the greater trochanter of the left hip. Positive Faber's left SI. Positive distraction test left Positive thigh thrust left  Reduced PP B L4-5-S1  5/5 BLE strength       Assessment & Plan:   #1.  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  He had decompressive surgery per his report I do not have the actual report.  He reports seeing Dr. Lovell Sheehan in follow-up as well.  I reviewed his MRI of the hips as well as the lumbar spine.  We discussed treatment options for his pain he is not interested in pursuing any physical therapy he would be interested in pursuing some medication management as well as injections. He states he  has not had side spine injections. He does have neuropathic pain with reduced sensation in both feet which could also be related to his diabetes.  He has been on long-term gabapentin we will switch to pregabalin 75 mg 3 times daily Hold off on epidurals as he would have to come off of Plavix. Patient has a prescription for hydrocodone but has not started this yet he has not been taking tramadol for a while.  Appears to be low risk for chronic opioid therapy. Physical medicine rehab follow-up in 6 weeks  2.  Trochanteric bursitis left hip.  Will do injection today  LEFT Trochanteric bursa injection  without ultrasound guidance  Indication Trochanteric bursitis. Exam has tenderness over the greater trochanter of the hip. Pain has not responded to conservative care such as exercise therapy and oral medications. Pain interferes with sleep or with mobility Informed consent was obtained after describing risks and benefits of the procedure with the patient these include bleeding bruising and infection. Patient has signed written consent form. Patient placed in a lateral decubitus position with the affected hip superior. Point of maximal pain was palpated marked and prepped with Betadine and entered with a needle to bone contact. Needle slightly withdrawn then 6mg  of betamethasone with 4 cc 1% lidocaine were injected. Patient tolerated procedure well. Post procedure instructions given.

## 2022-12-25 ENCOUNTER — Other Ambulatory Visit: Payer: Self-pay | Admitting: Family

## 2022-12-27 ENCOUNTER — Other Ambulatory Visit: Payer: Self-pay | Admitting: Family

## 2022-12-31 ENCOUNTER — Other Ambulatory Visit: Payer: Self-pay | Admitting: Family

## 2023-01-01 ENCOUNTER — Telehealth: Payer: Self-pay | Admitting: Family

## 2023-01-01 LAB — HM DIABETES EYE EXAM

## 2023-01-03 MED ORDER — TRULICITY 1.5 MG/0.5ML ~~LOC~~ SOAJ: 1.5000 mg | SUBCUTANEOUS | 2 refills | Status: DC

## 2023-01-03 NOTE — Addendum Note (Signed)
Addended by: Jannifer Rodney A on: 01/03/2023 04:03 PM   Modules accepted: Orders

## 2023-01-03 NOTE — Telephone Encounter (Signed)
Made patient aware. Pt will pick up new Rx at the pharmacy. He says thank you.

## 2023-01-03 NOTE — Telephone Encounter (Signed)
Trulicity 1.5 mg  Prescription sent to pharmacy

## 2023-01-03 NOTE — Telephone Encounter (Signed)
Pt says he did not call around to other pharmacies because he doesn't want to use another pharmacy. He wants to know if PCP can change is Trulicity Rx to him taking 1.5 mg BID since pharmacy has the 1.5 dosage in stock. Please advise.

## 2023-01-03 NOTE — Telephone Encounter (Signed)
Spoke with patient wife they are to call back and let us know what pharmacy they find.

## 2023-01-03 NOTE — Telephone Encounter (Signed)
Please have patient call around pharmacies and see if he find it.

## 2023-01-03 NOTE — Telephone Encounter (Signed)
Left detailed message.   

## 2023-01-07 ENCOUNTER — Encounter (INDEPENDENT_AMBULATORY_CARE_PROVIDER_SITE_OTHER): Payer: Medicare (Managed Care) | Admitting: Ophthalmology

## 2023-01-07 DIAGNOSIS — Z7984 Long term (current) use of oral hypoglycemic drugs: Secondary | ICD-10-CM | POA: Diagnosis not present

## 2023-01-07 DIAGNOSIS — H43813 Vitreous degeneration, bilateral: Secondary | ICD-10-CM | POA: Diagnosis not present

## 2023-01-07 DIAGNOSIS — E113313 Type 2 diabetes mellitus with moderate nonproliferative diabetic retinopathy with macular edema, bilateral: Secondary | ICD-10-CM | POA: Diagnosis not present

## 2023-01-08 ENCOUNTER — Encounter (INDEPENDENT_AMBULATORY_CARE_PROVIDER_SITE_OTHER): Payer: Commercial Managed Care - HMO | Admitting: Ophthalmology

## 2023-01-09 ENCOUNTER — Telehealth: Payer: Self-pay | Admitting: Family

## 2023-01-09 MED ORDER — LANCET DEVICE MISC: 1.0000 | Freq: Three times a day (TID) | 0 refills | Status: AC

## 2023-01-09 MED ORDER — BLOOD GLUCOSE MONITORING SUPPL DEVI: 1.0000 | Freq: Three times a day (TID) | 0 refills | Status: AC

## 2023-01-09 MED ORDER — LANCETS MISC. MISC: 1.0000 | Freq: Three times a day (TID) | 0 refills | Status: AC

## 2023-01-09 MED ORDER — BLOOD GLUCOSE TEST VI STRP: 1.0000 | ORAL_STRIP | Freq: Three times a day (TID) | 0 refills | Status: DC

## 2023-01-09 NOTE — Telephone Encounter (Signed)
Pt called requesting Rx for a Glucose meter and test strips. Says he has already spoken with his insurance about this and was told that the One touch Ultra 2 Glucose Meter and the Verio Flex Glucose Meter are both covered.  Please advise.

## 2023-01-09 NOTE — Telephone Encounter (Signed)
Sent to the pharmacy

## 2023-01-10 ENCOUNTER — Encounter (INDEPENDENT_AMBULATORY_CARE_PROVIDER_SITE_OTHER): Payer: Medicare (Managed Care) | Admitting: Ophthalmology

## 2023-01-10 DIAGNOSIS — E113313 Type 2 diabetes mellitus with moderate nonproliferative diabetic retinopathy with macular edema, bilateral: Secondary | ICD-10-CM | POA: Diagnosis not present

## 2023-01-13 ENCOUNTER — Other Ambulatory Visit: Payer: Self-pay | Admitting: Family

## 2023-01-13 ENCOUNTER — Encounter (INDEPENDENT_AMBULATORY_CARE_PROVIDER_SITE_OTHER): Payer: Medicare (Managed Care) | Admitting: Ophthalmology

## 2023-01-18 ENCOUNTER — Other Ambulatory Visit: Payer: Self-pay | Admitting: Family

## 2023-01-18 DIAGNOSIS — M25559 Pain in unspecified hip: Secondary | ICD-10-CM

## 2023-01-18 DIAGNOSIS — M150 Primary generalized (osteo)arthritis: Secondary | ICD-10-CM

## 2023-01-18 DIAGNOSIS — M159 Polyosteoarthritis, unspecified: Secondary | ICD-10-CM

## 2023-02-02 ENCOUNTER — Other Ambulatory Visit: Payer: Self-pay | Admitting: Family

## 2023-02-04 ENCOUNTER — Encounter
Payer: Medicare (Managed Care) | Attending: Physical Medicine & Rehabilitation | Admitting: Physical Medicine & Rehabilitation

## 2023-02-04 ENCOUNTER — Encounter: Payer: Self-pay | Admitting: Physical Medicine & Rehabilitation

## 2023-02-04 VITALS — BP 128/77 | HR 80 | Ht 65.0 in | Wt 136.8 lb

## 2023-02-04 DIAGNOSIS — M25552 Pain in left hip: Secondary | ICD-10-CM

## 2023-02-04 DIAGNOSIS — Z5181 Encounter for therapeutic drug level monitoring: Secondary | ICD-10-CM | POA: Diagnosis not present

## 2023-02-04 DIAGNOSIS — G8929 Other chronic pain: Secondary | ICD-10-CM

## 2023-02-04 DIAGNOSIS — M25562 Pain in left knee: Secondary | ICD-10-CM | POA: Insufficient documentation

## 2023-02-04 MED ORDER — TRAMADOL HCL 50 MG PO TABS: 50.0000 mg | ORAL_TABLET | Freq: Two times a day (BID) | ORAL | 0 refills | Status: DC

## 2023-02-04 MED ORDER — PREGABALIN 150 MG PO CAPS: 150.0000 mg | ORAL_CAPSULE | Freq: Three times a day (TID) | ORAL | 1 refills | Status: DC

## 2023-02-04 NOTE — Progress Notes (Signed)
Subjective:    Patient ID: Jimmy Craig, male    DOB: 04-05-1951, 72 y.o.   MRN: [id number] 72 yo male with hx of diabetes with retinopathy and lumbar spinal stenosis.  The patient underwent lumbar decompressive surgery with Dr. Delma Officer referred for back and  lower ext pain with primary care physician.  The patient has been taking gabapentin 600 mg twice daily for this problem his pain is rated as 5 out of 10 on average.  Dull and aching.  Pain is worse with bending.  He also has some hip pain on the left side.  He has had MRI of the hip last year. 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 Lovell Sheehan.  He is a diabetic with retinopathy.  He complains of moderate to severe pain described as sharp increased with walking and bending.  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 He has lower extremity paresthesias, did not get good relief with gabapentin was switched to Lyrica 75 mg 3 times daily.  He states that this has not been helping his pain but has not had any side effects from the medication.   Pain Inventory Average Pain 8 Pain Right Now 7 My pain is sharp  In the last 24 hours, has pain interfered with the following? General activity 6 Relation with others 6 Enjoyment of life 8 What TIME of day is your pain at its worst? evening Sleep (in general) Fair  Pain is worse with: walking and bending Pain improves with: rest Relief from Meds: 2  Family History  Problem Relation Age of Onset   Diabetes Mother    Social History   Socioeconomic History   Marital status: Married    Spouse name: Not on file   Number of children: Not on file   Years of education: Not on file   Highest education level: Master's degree (e.g., MA, MS, MEng, MEd, MSW, MBA)  Occupational  History   Not on file  Tobacco Use   Smoking status: Never   Smokeless tobacco: Never  Vaping Use   Vaping Use: Never used  Substance and Sexual Activity   Alcohol use: No   Drug use: No   Sexual activity: Yes    Birth control/protection: None  Other Topics Concern   Not on file  Social History Narrative   Not on file   Social Determinants of Health   Financial Resource Strain: Medium Risk (12/03/2022)   Overall Financial Resource Strain (CARDIA)    Difficulty of Paying Living Expenses: Somewhat hard  Food Insecurity: No Food Insecurity (12/03/2022)   Hunger Vital Sign    Worried About Running Out of Food in the Last Year: Never true    Ran Out of Food in the Last Year: Never true  Transportation Needs: No Transportation Needs (12/03/2022)   PRAPARE - Administrator, Civil Service (Medical): No    Lack of Transportation (Non-Medical): No  Physical Activity: Insufficiently Active (12/03/2022)   Exercise Vital Sign    Days of Exercise per Week: 3 days    Minutes of Exercise per Session: 10 min  Stress: Stress Concern Present (12/03/2022)   [employer] of Occupational Health - Occupational Stress Questionnaire    Feeling of Stress : To some extent  Social Connections: Moderately Integrated (12/03/2022)   Social Connection and Isolation Panel [NHANES]    Frequency of Communication with Friends and Family: Three times a week    Frequency of Social Gatherings with Friends and Family: Three times a week    Attends Religious Services: 1 to 4 times per year    Active Member of Clubs or Organizations: No    Attends Engineer, structural: Not on file    Marital Status: Married   Past Surgical History:  Procedure Laterality Date   CATARACT EXTRACTION Right    CATARACT EXTRACTION W/PHACO Left 12/20/2013   Procedure: CATARACT EXTRACTION PHACO AND INTRAOCULAR LENS PLACEMENT (IOC);  Surgeon: Susa Simmonds, MD;  Location: [facility];  Service: Ophthalmology;   Laterality: Left;  CDE: 0.97   Past Surgical History:  Procedure Laterality Date   CATARACT EXTRACTION Right    CATARACT EXTRACTION W/PHACO Left 12/20/2013   Procedure: CATARACT EXTRACTION PHACO AND INTRAOCULAR LENS PLACEMENT (IOC);  Surgeon: Susa Simmonds, MD;  Location: [facility];  Service: Ophthalmology;  Laterality: Left;  CDE: 0.97   Past Medical History:  Diagnosis Date   Arthritis    BPH (benign prostatic hyperplasia)    Chronic back pain    GERD (gastroesophageal reflux disease)    Hypertension    Mixed hyperlipidemia    Type 2 diabetes mellitus (HCC)    BP 128/77   Pulse 80   Ht 5\' 5"  (1.651 m)   Wt 136 lb 12.8 oz (62.1 kg)   SpO2 96%   BMI 22.76 kg/m   Opioid Risk Score:   Fall Risk Score:  `1  Depression screen PHQ 2/9     12/20/2022    2:30 PM 08/13/2022    1:41 PM 05/13/2022    3:23 PM 01/29/2022   11:05 AM 10/22/2021    1:49 PM 04/23/2021    2:02 PM 01/08/2021    2:48 PM  Depression screen PHQ 2/9  Decreased Interest 1 0 0 0 0 0 0  Down, Depressed, Hopeless 0 0 0 0 0 0 0  PHQ - 2 Score 1 0 0 0 0 0 0  Altered sleeping 1 0  0  0 0  Tired, decreased energy 2 0  2  1 0  Change in appetite 0 0  0  0 0  Feeling bad or failure about yourself  0 0  0  0 0  Trouble concentrating 1 0  1   0  Moving slowly or fidgety/restless 2 0  0  0 0  Suicidal thoughts 0 0  0  0 0  PHQ-9 Score 7 0  3  1 0  Difficult doing work/chores Somewhat difficult Not difficult at all  Somewhat difficult  Not difficult at all Not difficult at all    Review of Systems  Constitutional: Negative.   HENT: Negative.    Eyes: Negative.   Respiratory: Negative.    Cardiovascular: Negative.   Gastrointestinal: Negative.   Endocrine: Negative.   Genitourinary: Negative.   Musculoskeletal:  Positive for back pain and gait problem.       Left side leg pain  Skin: Negative.   Allergic/Immunologic: Negative.   Hematological: Negative.   Psychiatric/Behavioral: Negative.    All other systems  reviewed and are negative.      Objective:   Physical Exam General no acute distress Mood and affect are appropriate Extremities without edema Lumbar range of motion full lumbar flexion extension is approximately 50%, lateral bending  50%. There is pain with left hip range of motion with mainly internal rotation.  Left knee has no evidence of effusion there is medial and lateral joint line tenderness.  There is no erythema.  He has full range of motion with flexion extension and no pain at end range.  He ambulates without assistive device no evidence of toe drag nor knee instability       Assessment & Plan:   1.  Lumbar postlaminectomy syndrome with chronic lower extremity paresthesias will increase Lyrica to 150 mg 3 times daily and monitor for drowsiness. 2.  Left hip/groin pain, pain with range of motion on examination.  Suspect that he has underlying OA will check x-rays 3.  Left knee pain difficult to say whether this is radiating from the hip.  His knee exam is fairly unremarkable except for medial and lateral joint line tenderness.  I suspect he may have some meniscal degeneration versus OA.  Will check x-ray. Given his activity related pain we will start some tramadol 50 mg twice daily as a trial.  If he states this on more than a month we will need to do urine drug screen as well as controlled substance agreement. He complains of reduced mobility does have some hip joint restriction will refer to physical therapy.

## 2023-02-10 ENCOUNTER — Encounter (INDEPENDENT_AMBULATORY_CARE_PROVIDER_SITE_OTHER): Payer: Medicare (Managed Care) | Admitting: Ophthalmology

## 2023-02-10 DIAGNOSIS — Z7984 Long term (current) use of oral hypoglycemic drugs: Secondary | ICD-10-CM | POA: Diagnosis not present

## 2023-02-10 DIAGNOSIS — E113313 Type 2 diabetes mellitus with moderate nonproliferative diabetic retinopathy with macular edema, bilateral: Secondary | ICD-10-CM | POA: Diagnosis not present

## 2023-02-10 DIAGNOSIS — H33301 Unspecified retinal break, right eye: Secondary | ICD-10-CM | POA: Diagnosis not present

## 2023-02-10 DIAGNOSIS — H43813 Vitreous degeneration, bilateral: Secondary | ICD-10-CM

## 2023-02-18 ENCOUNTER — Ambulatory Visit (HOSPITAL_COMMUNITY)
Admission: RE | Admit: 2023-02-18 | Discharge: 2023-02-18 | Disposition: A | Discharge status: Home / Self Care | Payer: Medicare (Managed Care) | Source: Ambulatory Visit | Attending: Physical Medicine & Rehabilitation | Admitting: Physical Medicine & Rehabilitation

## 2023-02-18 DIAGNOSIS — G8929 Other chronic pain: Secondary | ICD-10-CM | POA: Insufficient documentation

## 2023-02-18 DIAGNOSIS — M11252 Other chondrocalcinosis, left hip: Secondary | ICD-10-CM | POA: Diagnosis not present

## 2023-02-18 DIAGNOSIS — M25552 Pain in left hip: Secondary | ICD-10-CM | POA: Insufficient documentation

## 2023-02-18 DIAGNOSIS — M25562 Pain in left knee: Secondary | ICD-10-CM | POA: Insufficient documentation

## 2023-02-20 ENCOUNTER — Ambulatory Visit: Payer: Medicare (Managed Care) | Attending: Physical Medicine & Rehabilitation

## 2023-02-20 ENCOUNTER — Other Ambulatory Visit: Payer: Self-pay

## 2023-02-20 DIAGNOSIS — M5459 Other low back pain: Secondary | ICD-10-CM | POA: Insufficient documentation

## 2023-02-20 DIAGNOSIS — M6281 Muscle weakness (generalized): Secondary | ICD-10-CM | POA: Insufficient documentation

## 2023-02-20 DIAGNOSIS — M25552 Pain in left hip: Secondary | ICD-10-CM | POA: Diagnosis not present

## 2023-02-20 NOTE — Therapy (Signed)
OUTPATIENT PHYSICAL THERAPY LOWER EXTREMITY EVALUATION   Patient Name: Jimmy Craig MRN: [id number] DOB:01/01/1951, 72 y.o., male Today's Date: 02/20/2023  END OF SESSION:  PT End of Session - 02/20/23 1302     Visit Number 1    Number of Visits 8    Date for PT Re-Evaluation 05/02/23    PT Start Time 1303    PT Stop Time 1336    PT Time Calculation (min) 33 min    Activity Tolerance Patient tolerated treatment well    Behavior During Therapy [facility] for tasks assessed/performed             Past Medical History:  Diagnosis Date   Arthritis    BPH (benign prostatic hyperplasia)    Chronic back pain    GERD (gastroesophageal reflux disease)    Hypertension    Mixed hyperlipidemia    Type 2 diabetes mellitus (HCC)    Past Surgical History:  Procedure Laterality Date   CATARACT EXTRACTION Right    CATARACT EXTRACTION W/PHACO Left 12/20/2013   Procedure: CATARACT EXTRACTION PHACO AND INTRAOCULAR LENS PLACEMENT (IOC);  Surgeon: Susa Simmonds, MD;  Location: [facility];  Service: Ophthalmology;  Laterality: Left;  CDE: 0.97   Patient Active Problem List   Diagnosis Date Noted   Retinal hole or tear, right 04/25/2022   Vitreous hemorrhage, right eye (HCC) 04/25/2022   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) 04/25/2022   Chronic obstructive pulmonary disease (HCC) 10/22/2021   Lumbago with sciatica, left side 03/16/2021   Constipation 06/01/2020   Osteoarthritis 04/26/2019   Hypertension associated with diabetes (HCC) 09/29/2018   Diabetes (HCC) 08/14/2018   Hyperlipidemia associated with type 2 diabetes mellitus (HCC) 08/14/2018   GERD without esophagitis 08/14/2018   BPH (benign prostatic hyperplasia) 08/14/2018   Iron deficiency anemia 08/14/2018   Spinal stenosis of lumbar region 08/08/2017   Dizzy spells 01/20/2015    PCP: Junie Spencer, FNP  REFERRING PROVIDER: Erick Colace, MD   REFERRING DIAG: Left hip  pain   THERAPY DIAG:  Other low back pain  Pain in left hip  Muscle weakness (generalized)  Rationale for Evaluation and Treatment: Rehabilitation  ONSET DATE: 2 years ago   SUBJECTIVE:   SUBJECTIVE STATEMENT: Patient reports that he has been having numbness and pain in his left leg for almost 2 years. He had surgery on his back which helped the numbness in his leg, but it has not helped with the pain. He notes that when his pain gets really bad then he cannot walk. He is also having problems with his right leg, but it is not as bad as the left leg.   PERTINENT HISTORY: Hypertension, diabetes, COPD, diabetes, and osteoarthritis PAIN:  Are you having pain? Yes: NPRS scale: 7-8/10 Pain location: left anterior hip radiating down to his ankle Pain description: intermittent sharp pain Aggravating factors: putting pressure on his left foot, walking, steps Relieving factors: sitting and laying down  PRECAUTIONS: None  RED FLAGS: None   WEIGHT BEARING RESTRICTIONS: No  FALLS:  Has patient fallen in last 6 months?  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  POSTURE: forward head and decreased lumbar lordosis  PALPATION: TTP: L3-5 spinous processes, left hip flexors, TFL, and proximal quadriceps  LUMBAR ROM:   Active  A/PROM  eval  Flexion 50; familiar pain  Extension 10  Right lateral flexion   Left lateral flexion   Right rotation   Left rotation    (Blank  rows = not tested)   LOWER EXTREMITY ROM:     Active  Right eval Left eval  Hip flexion 122; slight right hip pain  110; slight left hip pain   Hip extension    Hip abduction    Hip adduction    Hip internal rotation  WFL (PROM)  Hip external rotation  Familiar pain with PROM   Knee flexion    Knee extension    Ankle dorsiflexion    Ankle plantarflexion    Ankle inversion    Ankle eversion     (Blank rows = not tested)   LOWER EXTREMITY MMT:  MMT Right eval Left eval  Hip flexion 4-/5 4-/5  Hip extension    Hip abduction    Hip adduction    Hip internal rotation    Hip external rotation    Knee flexion 4/5 4/5  Knee extension 5/5 5/5  Ankle dorsiflexion 4/5 4/5  Ankle plantarflexion    Ankle inversion    Ankle eversion     (Blank rows = not tested)  LOWER EXTREMITY SPECIAL TESTS:  Hip special tests: Luisa Hart (FABER) test: positive  and Hip scouring test: positive   GAIT: Assistive device utilized: None Level of assistance: Complete Independence  TODAY'S TREATMENT:                                                                                                                              DATE:     PATIENT EDUCATION:  Education details: Plan of care, prognosis, objective findings, and goals for therapy Person educated: Patient Education method: Explanation Education comprehension: verbalized understanding  HOME EXERCISE PROGRAM:   ASSESSMENT:  CLINICAL IMPRESSION: Patient is a 72 y.o. male who was seen today for physical therapy evaluation and treatment for low back and left hip pain.  He presented with moderate pain severity and irritability with lumbar flexion and palpation to his lumbar spine and hip musculature reproducing his familiar symptoms.  Recommend that he continue with skilled physical therapy to address his impairments to maximize his functional mobility.  OBJECTIVE IMPAIRMENTS: decreased activity tolerance, decreased mobility, difficulty  walking, decreased ROM, decreased strength, impaired tone, and pain.   ACTIVITY LIMITATIONS: carrying, lifting, standing, squatting, and locomotion level  PARTICIPATION LIMITATIONS: meal prep, cleaning, laundry, shopping, community activity, and occupation  PERSONAL FACTORS: Past/current experiences, Time since onset of injury/illness/exacerbation, and 3+ comorbidities: Hypertension, diabetes, COPD, diabetes, and osteoarthritis  are also affecting patient's functional outcome.   REHAB POTENTIAL: Fair    CLINICAL DECISION MAKING: Evolving/moderate complexity  EVALUATION COMPLEXITY: Moderate   GOALS: Goals reviewed with patient? Yes  LONG TERM GOALS: Target date: 03/20/23  Patient will be independent with his HEP. Baseline:  Goal status: INITIAL  2.  Patient will be able to complete his daily activities without his familiar pain exceeding 5/10. Baseline:  Goal status: INITIAL  3.  Patient will improve his hip flexor strength bilaterally to at least 4/5 for improved lower extremity strength. Baseline:  Goal status: INITIAL  4.  Patient will be able to complete his critical job demands without being limited by his familiar symptoms. Baseline:  Goal status: INITIAL  PLAN:  PT FREQUENCY: 2x/week  PT DURATION: 4 weeks  PLANNED INTERVENTIONS: Therapeutic exercises, Therapeutic activity, Neuromuscular re-education, Balance training, Patient/Family education, Self Care, Joint mobilization, Electrical stimulation, Spinal mobilization, Cryotherapy, Moist heat, Manual therapy, and Re-evaluation  PLAN FOR NEXT SESSION: NuStep, lumbar and lower extremity strengthening (bridging, straight leg raise, etc.), balance interventions, and modalities as needed   Granville Lewis, PT 02/20/2023, 6:12 PM

## 2023-02-21 ENCOUNTER — Other Ambulatory Visit (HOSPITAL_COMMUNITY): Payer: Self-pay

## 2023-02-25 ENCOUNTER — Ambulatory Visit: Payer: Medicare (Managed Care)

## 2023-02-25 DIAGNOSIS — M5459 Other low back pain: Secondary | ICD-10-CM

## 2023-02-25 DIAGNOSIS — M25552 Pain in left hip: Secondary | ICD-10-CM

## 2023-02-25 DIAGNOSIS — M6281 Muscle weakness (generalized): Secondary | ICD-10-CM

## 2023-02-25 NOTE — Therapy (Signed)
OUTPATIENT PHYSICAL THERAPY LOWER EXTREMITY TREATMENT   Patient Name: Jimmy Craig MRN: [id number] DOB:02/10/1951, 72 y.o., male Today's Date: 02/25/2023  END OF SESSION:  PT End of Session - 02/25/23 1348     Visit Number 2    Number of Visits 8    Date for PT Re-Evaluation 05/02/23    PT Start Time 1344    PT Stop Time 1426    PT Time Calculation (min) 42 min    Activity Tolerance Patient tolerated treatment well    Behavior During Therapy WFL for tasks assessed/performed              Past Medical History:  Diagnosis Date   Arthritis    BPH (benign prostatic hyperplasia)    Chronic back pain    GERD (gastroesophageal reflux disease)    Hypertension    Mixed hyperlipidemia    Type 2 diabetes mellitus (HCC)    Past Surgical History:  Procedure Laterality Date   CATARACT EXTRACTION Right    CATARACT EXTRACTION W/PHACO Left 12/20/2013   Procedure: CATARACT EXTRACTION PHACO AND INTRAOCULAR LENS PLACEMENT (IOC);  Surgeon: Susa Simmonds, MD;  Location: [facility];  Service: Ophthalmology;  Laterality: Left;  CDE: 0.97   Patient Active Problem List   Diagnosis Date Noted   Retinal hole or tear, right 04/25/2022   Vitreous hemorrhage, right eye (HCC) 04/25/2022   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) 04/25/2022   Chronic obstructive pulmonary disease (HCC) 10/22/2021   Lumbago with sciatica, left side 03/16/2021   Constipation 06/01/2020   Osteoarthritis 04/26/2019   Hypertension associated with diabetes (HCC) 09/29/2018   Diabetes (HCC) 08/14/2018   Hyperlipidemia associated with type 2 diabetes mellitus (HCC) 08/14/2018   GERD without esophagitis 08/14/2018   BPH (benign prostatic hyperplasia) 08/14/2018   Iron deficiency anemia 08/14/2018   Spinal stenosis of lumbar region 08/08/2017   Dizzy spells 01/20/2015    PCP: Junie Spencer, FNP  REFERRING PROVIDER: Erick Colace, MD   REFERRING DIAG: Left hip  pain   THERAPY DIAG:  Other low back pain  Pain in left hip  Muscle weakness (generalized)  Rationale for Evaluation and Treatment: Rehabilitation  ONSET DATE: 2 years ago   SUBJECTIVE:   SUBJECTIVE STATEMENT: Patient reports that he feels about the same today as he did at his last appointment.   PERTINENT HISTORY: Hypertension, diabetes, COPD, diabetes, and osteoarthritis PAIN:  Are you having pain? Yes: NPRS scale: 7-8/10 Pain location: left anterior hip radiating down to his ankle Pain description: intermittent sharp pain Aggravating factors: putting pressure on his left foot, walking, steps Relieving factors: sitting and laying down  PRECAUTIONS: None  RED FLAGS: None   WEIGHT BEARING RESTRICTIONS: No  FALLS:  Has patient fallen in last 6 months?  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  POSTURE: forward head and decreased lumbar lordosis  PALPATION: TTP:  L3-5 spinous processes, left hip flexors, TFL, and proximal quadriceps  LUMBAR ROM:   Active  A/PROM  eval  Flexion 50; familiar pain  Extension 10  Right lateral flexion   Left lateral flexion   Right rotation   Left rotation    (Blank rows = not tested)   LOWER EXTREMITY ROM:     Active  Right eval Left eval  Hip flexion 122; slight right hip pain  110; slight left hip pain   Hip  extension    Hip abduction    Hip adduction    Hip internal rotation  WFL (PROM)  Hip external rotation  Familiar pain with PROM   Knee flexion    Knee extension    Ankle dorsiflexion    Ankle plantarflexion    Ankle inversion    Ankle eversion     (Blank rows = not tested)   LOWER EXTREMITY MMT:  MMT Right eval Left eval  Hip flexion 4-/5 4-/5  Hip extension    Hip abduction    Hip adduction    Hip internal rotation    Hip external rotation    Knee flexion 4/5 4/5  Knee extension 5/5 5/5  Ankle dorsiflexion 4/5 4/5  Ankle plantarflexion    Ankle inversion    Ankle eversion     (Blank rows = not tested)  LOWER EXTREMITY SPECIAL TESTS:  Hip special tests: Luisa Hart (FABER) test: positive  and Hip scouring test: positive   GAIT: Assistive device utilized: None Level of assistance: Complete Independence  TODAY'S TREATMENT:                                                                                                                              DATE:                                     02/25/23 EXERCISE LOG  Exercise Repetitions and Resistance Comments  Nustep L3 x 12 minutes    Standing HS curl  30 reps  LLE only   Lunges onto step  6" step x 2 minutes  LLE only   LAQ 2 minutes LLE only   Standing hip ABD w/ toe tap  2 minutes   Thomas stretch Attempted, but limited due to pain    Supine bent knee fall out  2.5 minutes  Alternating LE   SL hip ABD  30 reps   LLE only   SLR  30 reps  LLE only    Blank cell = exercise not performed today  Manual Therapy Soft Tissue Mobilization: left lateral quadriceps, for reduced pain and tone    PATIENT EDUCATION:  Education details: Plan of care, prognosis, objective findings, and goals for therapy Person educated: Patient Education method: Explanation Education comprehension: verbalized understanding  HOME EXERCISE PROGRAM:   ASSESSMENT:  CLINICAL IMPRESSION: Patient was introduced to multiple  new interventions for  improved lower extremity mobility and reduced pain. He required moderate multimodal cueing with today's new interventions for proper pacing and exercise performance to avoid aggravating his familiar symptoms. Manual therapy focused on soft tissue mobilization to his left quadriceps for reduced pain and improved soft tissue extensibility with minimal effectiveness. He reported feeling "a little better" upon the conclusion of treatment. He continues to require skilled physical therapy to address his remaining impairments to return to his prior level of function.   OBJECTIVE IMPAIRMENTS: decreased activity tolerance, decreased mobility, difficulty walking, decreased ROM, decreased strength, impaired tone, and pain.   ACTIVITY LIMITATIONS: carrying, lifting, standing, squatting, and locomotion level  PARTICIPATION LIMITATIONS: meal prep, cleaning, laundry, shopping, community activity, and occupation  PERSONAL FACTORS: Past/current experiences, Time since onset of injury/illness/exacerbation, and 3+ comorbidities: Hypertension, diabetes, COPD, diabetes, and osteoarthritis  are also affecting patient's functional outcome.   REHAB POTENTIAL: Fair    CLINICAL DECISION MAKING: Evolving/moderate complexity  EVALUATION COMPLEXITY: Moderate   GOALS: Goals reviewed with patient? Yes  LONG TERM GOALS: Target date: 03/20/23  Patient will be independent with his HEP. Baseline:  Goal status: INITIAL  2.  Patient will be able to complete his daily activities without his familiar pain exceeding 5/10. Baseline:  Goal status: INITIAL  3.  Patient will improve his hip flexor strength bilaterally to at least 4/5 for improved lower extremity strength. Baseline:  Goal status: INITIAL  4.  Patient will be able to complete his critical job demands without being limited by his familiar symptoms. Baseline:  Goal status: INITIAL  PLAN:  PT FREQUENCY: 2x/week  PT DURATION: 4 weeks  PLANNED  INTERVENTIONS: Therapeutic exercises, Therapeutic activity, Neuromuscular re-education, Balance training, Patient/Family education, Self Care, Joint mobilization, Electrical stimulation, Spinal mobilization, Cryotherapy, Moist heat, Manual therapy, and Re-evaluation  PLAN FOR NEXT SESSION: NuStep, lumbar and lower extremity strengthening (bridging, straight leg raise, etc.), balance interventions, and modalities as needed   Granville Lewis, PT 02/25/2023, 2:31 PM

## 2023-02-28 ENCOUNTER — Ambulatory Visit: Payer: Medicare (Managed Care)

## 2023-02-28 DIAGNOSIS — M25552 Pain in left hip: Secondary | ICD-10-CM

## 2023-02-28 DIAGNOSIS — M5459 Other low back pain: Secondary | ICD-10-CM

## 2023-02-28 DIAGNOSIS — M6281 Muscle weakness (generalized): Secondary | ICD-10-CM

## 2023-02-28 NOTE — Therapy (Signed)
OUTPATIENT PHYSICAL THERAPY LOWER EXTREMITY TREATMENT   Patient Name: Jimmy Craig MRN: [id number] DOB:[birth year], 72 y.o., male Today's Date: 02/28/2023  END OF SESSION:  PT End of Session - 02/28/23 1155     Visit Number 3    Number of Visits 8    Date for PT Re-Evaluation 05/02/23    PT Start Time 1143    PT Stop Time 1225    PT Time Calculation (min) 42 min    Activity Tolerance Patient tolerated treatment well    Behavior During Therapy WFL for tasks assessed/performed               Past Medical History:  Diagnosis Date   Arthritis    BPH (benign prostatic hyperplasia)    Chronic back pain    GERD (gastroesophageal reflux disease)    Hypertension    Mixed hyperlipidemia    Type 2 diabetes mellitus (HCC)    Past Surgical History:  Procedure Laterality Date   CATARACT EXTRACTION Right    CATARACT EXTRACTION W/PHACO Left 12/20/2013   Procedure: CATARACT EXTRACTION PHACO AND INTRAOCULAR LENS PLACEMENT (IOC);  Surgeon: Susa Simmonds, MD;  Location: [facility];  Service: Ophthalmology;  Laterality: Left;  CDE: 0.97   Patient Active Problem List   Diagnosis Date Noted   Retinal hole or tear, right 04/25/2022   Vitreous hemorrhage, right eye (HCC) 04/25/2022   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) 04/25/2022   Chronic obstructive pulmonary disease (HCC) 10/22/2021   Lumbago with sciatica, left side 03/16/2021   Constipation 06/01/2020   Osteoarthritis 04/26/2019   Hypertension associated with diabetes (HCC) 09/29/2018   Diabetes (HCC) 08/14/2018   Hyperlipidemia associated with type 2 diabetes mellitus (HCC) 08/14/2018   GERD without esophagitis 08/14/2018   BPH (benign prostatic hyperplasia) 08/14/2018   Iron deficiency anemia 08/14/2018   Spinal stenosis of lumbar region 08/08/2017   Dizzy spells 01/20/2015    PCP: Junie Spencer, FNP  REFERRING PROVIDER: Erick Colace, MD   REFERRING DIAG: Left hip  pain   THERAPY DIAG:  Other low back pain  Pain in left hip  Muscle weakness (generalized)  Rationale for Evaluation and Treatment: Rehabilitation  ONSET DATE: 2 years ago   SUBJECTIVE:   SUBJECTIVE STATEMENT: Patient reports that he feels better today. He feels it is about 10-15% better.   PERTINENT HISTORY: Hypertension, diabetes, COPD, diabetes, and osteoarthritis PAIN:  Are you having pain? Yes: NPRS scale: no pain score provided/10 Pain location: left anterior hip radiating down to his ankle Pain description: intermittent sharp pain Aggravating factors: putting pressure on his left foot, walking, steps Relieving factors: sitting and laying down  PRECAUTIONS: None  RED FLAGS: None   WEIGHT BEARING RESTRICTIONS: No  FALLS:  Has patient fallen in last 6 months?  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  POSTURE: forward head and decreased lumbar lordosis  PALPATION: TTP: L3-5 spinous processes, left hip flexors, TFL, and proximal quadriceps  LUMBAR ROM:   Active  A/PROM  eval  Flexion 50; familiar pain  Extension 10  Right lateral flexion   Left lateral flexion   Right rotation   Left rotation    (Blank rows = not tested)   LOWER EXTREMITY ROM:     Active  Right eval Left eval  Hip flexion 122; slight right hip pain  110; slight left hip pain    Hip extension    Hip abduction    Hip adduction    Hip internal rotation  WFL (PROM)  Hip external rotation  Familiar pain with PROM   Knee flexion    Knee extension    Ankle dorsiflexion    Ankle plantarflexion    Ankle inversion    Ankle eversion     (Blank rows = not tested)   LOWER EXTREMITY MMT:  MMT Right eval Left eval  Hip flexion 4-/5 4-/5  Hip extension    Hip abduction    Hip adduction    Hip internal rotation    Hip external rotation    Knee flexion 4/5 4/5  Knee extension 5/5 5/5  Ankle dorsiflexion 4/5 4/5  Ankle plantarflexion    Ankle inversion    Ankle eversion     (Blank rows = not tested)  LOWER EXTREMITY SPECIAL TESTS:  Hip special tests: Luisa Hart (FABER) test: positive  and Hip scouring test: positive   GAIT: Assistive device utilized: None Level of assistance: Complete Independence  TODAY'S TREATMENT:                                                                                                                              DATE:                                     02/28/23 EXERCISE LOG  Exercise Repetitions and Resistance Comments  Nustep  L3 x 15 minutes    Seated HS stretch  10 reps each w/ 10 second hold    LAQ 2# x 20 reps each    Standing hip flexion  2# x 2 minutes  Alternating LE   Standing HS curl  2# x 2 minutes Alternating LE   Standing hip ABD  2# x 2 minutes Alternating LE   Seated hip ADD isometric  3 minutes  w/ 5 second hold    Supine clam 2 minutes   SLR 20 reps each     Blank cell = exercise not performed today                                    02/25/23 EXERCISE LOG  Exercise Repetitions and Resistance Comments  Nustep L3 x 12 minutes    Standing  HS curl  30 reps  LLE only   Lunges onto step  6" step x 2 minutes  LLE only   LAQ 2 minutes LLE only   Standing hip ABD w/ toe tap  2 minutes   Thomas stretch Attempted, but limited due to pain    Supine bent knee fall out  2.5 minutes  Alternating LE   SL hip ABD  30  reps   LLE only   SLR  30 reps  LLE only    Blank cell = exercise not performed today  Manual Therapy Soft Tissue Mobilization: left lateral quadriceps, for reduced pain and tone    PATIENT EDUCATION:  Education details: Plan of care, prognosis, objective findings, and goals for therapy Person educated: Patient Education method: Explanation Education comprehension: verbalized understanding  HOME EXERCISE PROGRAM:   ASSESSMENT:  CLINICAL IMPRESSION: Patient was progressed with multiple new and familiar interventions for improved lower extremity strength. He required minimal cueing with these interventions for proper exercise performance and pacing to avoid compensatory patterns. He experienced increased fatigue and muscle soreness with today's interventions, but no familiar pain. He reported feeling alright upon the conclusion of treatment. He continues to require skilled physical therapy to address his remaining impairments to return to his prior level of function.    OBJECTIVE IMPAIRMENTS: decreased activity tolerance, decreased mobility, difficulty walking, decreased ROM, decreased strength, impaired tone, and pain.   ACTIVITY LIMITATIONS: carrying, lifting, standing, squatting, and locomotion level  PARTICIPATION LIMITATIONS: meal prep, cleaning, laundry, shopping, community activity, and occupation  PERSONAL FACTORS: Past/current experiences, Time since onset of injury/illness/exacerbation, and 3+ comorbidities: Hypertension, diabetes, COPD, diabetes, and osteoarthritis  are also affecting patient's functional outcome.   REHAB POTENTIAL: Fair    CLINICAL DECISION MAKING: Evolving/moderate complexity  EVALUATION COMPLEXITY: Moderate   GOALS: Goals reviewed with patient? Yes  LONG TERM GOALS: Target date: 03/20/23  Patient will be independent with his HEP. Baseline:  Goal status: INITIAL  2.  Patient will be able to complete his daily activities without his familiar pain  exceeding 5/10. Baseline:  Goal status: INITIAL  3.  Patient will improve his hip flexor strength bilaterally to at least 4/5 for improved lower extremity strength. Baseline:  Goal status: INITIAL  4.  Patient will be able to complete his critical job demands without being limited by his familiar symptoms. Baseline:  Goal status: INITIAL  PLAN:  PT FREQUENCY: 2x/week  PT DURATION: 4 weeks  PLANNED INTERVENTIONS: Therapeutic exercises, Therapeutic activity, Neuromuscular re-education, Balance training, Patient/Family education, Self Care, Joint mobilization, Electrical stimulation, Spinal mobilization, Cryotherapy, Moist heat, Manual therapy, and Re-evaluation  PLAN FOR NEXT SESSION: NuStep, lumbar and lower extremity strengthening (bridging, straight leg raise, etc.), balance interventions, and modalities as needed   Granville Lewis, PT 02/28/2023, 12:41 PM

## 2023-03-03 ENCOUNTER — Ambulatory Visit: Payer: Medicare (Managed Care)

## 2023-03-03 DIAGNOSIS — M25552 Pain in left hip: Secondary | ICD-10-CM

## 2023-03-03 DIAGNOSIS — M5459 Other low back pain: Secondary | ICD-10-CM | POA: Diagnosis not present

## 2023-03-03 DIAGNOSIS — M6281 Muscle weakness (generalized): Secondary | ICD-10-CM

## 2023-03-03 NOTE — Therapy (Signed)
OUTPATIENT PHYSICAL THERAPY LOWER EXTREMITY TREATMENT   Patient Name: Jimmy Craig MRN: [id number] DOB:August 31, 1950, 72 y.o., male Today's Date: 03/03/2023  END OF SESSION:  PT End of Session - 03/03/23 1349     Visit Number 4    Number of Visits 8    Date for PT Re-Evaluation 05/02/23    PT Start Time 1345    PT Stop Time 1425    PT Time Calculation (min) 40 min    Activity Tolerance Patient tolerated treatment well    Behavior During Therapy [facility] for tasks assessed/performed                Past Medical History:  Diagnosis Date   Arthritis    BPH (benign prostatic hyperplasia)    Chronic back pain    GERD (gastroesophageal reflux disease)    Hypertension    Mixed hyperlipidemia    Type 2 diabetes mellitus (HCC)    Past Surgical History:  Procedure Laterality Date   CATARACT EXTRACTION Right    CATARACT EXTRACTION W/PHACO Left 12/20/2013   Procedure: CATARACT EXTRACTION PHACO AND INTRAOCULAR LENS PLACEMENT (IOC);  Surgeon: Susa Simmonds, MD;  Location: [facility];  Service: Ophthalmology;  Laterality: Left;  CDE: 0.97   Patient Active Problem List   Diagnosis Date Noted   Retinal hole or tear, right 04/25/2022   Vitreous hemorrhage, right eye (HCC) 04/25/2022   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) 04/25/2022   Chronic obstructive pulmonary disease (HCC) 10/22/2021   Lumbago with sciatica, left side 03/16/2021   Constipation 06/01/2020   Osteoarthritis 04/26/2019   Hypertension associated with diabetes (HCC) 09/29/2018   Diabetes (HCC) 08/14/2018   Hyperlipidemia associated with type 2 diabetes mellitus (HCC) 08/14/2018   GERD without esophagitis 08/14/2018   BPH (benign prostatic hyperplasia) 08/14/2018   Iron deficiency anemia 08/14/2018   Spinal stenosis of lumbar region 08/08/2017   Dizzy spells 01/20/2015    PCP: Junie Spencer, FNP  REFERRING PROVIDER: Erick Colace, MD   REFERRING DIAG: Left  hip pain   THERAPY DIAG:  Other low back pain  Pain in left hip  Muscle weakness (generalized)  Rationale for Evaluation and Treatment: Rehabilitation  ONSET DATE: 2 years ago   SUBJECTIVE:   SUBJECTIVE STATEMENT: Patient reports that he was hurting a lot the past two days. He notes that it was in his left thigh and into his back. He was unable to recall of any reason for this increased pain.   PERTINENT HISTORY: Hypertension, diabetes, COPD, diabetes, and osteoarthritis PAIN:  Are you having pain? Yes: NPRS scale: no pain score provided/10 Pain location: left anterior hip radiating down to his ankle Pain description: intermittent sharp pain Aggravating factors: putting pressure on his left foot, walking, steps Relieving factors: sitting and laying down  PRECAUTIONS: None  RED FLAGS: None   WEIGHT BEARING RESTRICTIONS: No  FALLS:  Has patient fallen in last 6 months?  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  POSTURE: forward head and decreased lumbar lordosis  PALPATION: TTP: L3-5 spinous processes, left hip flexors, TFL, and proximal quadriceps  LUMBAR ROM:   Active  A/PROM  eval  Flexion 50; familiar pain  Extension 10  Right lateral flexion   Left lateral flexion   Right rotation   Left rotation    (Blank rows = not tested)   LOWER EXTREMITY ROM:      Active  Right eval Left eval  Hip flexion 122; slight right hip pain  110; slight left hip pain   Hip extension    Hip abduction    Hip adduction    Hip internal rotation  WFL (PROM)  Hip external rotation  Familiar pain with PROM   Knee flexion    Knee extension    Ankle dorsiflexion    Ankle plantarflexion    Ankle inversion    Ankle eversion     (Blank rows = not tested)   LOWER EXTREMITY MMT:  MMT Right eval Left eval  Hip flexion 4-/5 4-/5  Hip extension    Hip abduction    Hip adduction    Hip internal rotation    Hip external rotation    Knee flexion 4/5 4/5  Knee extension 5/5 5/5  Ankle dorsiflexion 4/5 4/5  Ankle plantarflexion    Ankle inversion    Ankle eversion     (Blank rows = not tested)  LOWER EXTREMITY SPECIAL TESTS:  Hip special tests: Luisa Hart (FABER) test: positive  and Hip scouring test: positive   GAIT: Assistive device utilized: None Level of assistance: Complete Independence  TODAY'S TREATMENT:                                                                                                                              DATE:                                     03/03/23 EXERCISE LOG  Exercise Repetitions and Resistance Comments  Nustep  L3 x 15 minutes   Rocker board  3.5 minutes    Lunges onto step  20 reps each  12" step   LAQ 4# x 20 reps each    Seated marching  4# x 20 reps each    Seated clams  Green t-band x 25 reps   Seated hip ABD Green t-band x 25 reps each    Seated hip ADD isometric  25 reps w/ 3 second hold    Resisted pull down  Blue XTS x 25 reps     Blank cell = exercise not performed today  Manual Therapy Soft Tissue Mobilization: left IT band, for reduced pain  02/28/23 EXERCISE LOG  Exercise Repetitions and Resistance Comments  Nustep  L3 x 15 minutes    Seated HS stretch  10 reps each w/ 10 second hold    LAQ 2# x 20 reps each    Standing hip flexion  2# x 2 minutes   Alternating LE   Standing HS curl  2# x 2 minutes Alternating LE   Standing hip ABD  2# x 2 minutes Alternating LE   Seated hip ADD isometric  3 minutes  w/ 5 second hold    Supine clam 2 minutes   SLR 20 reps each     Blank cell = exercise not performed today                                    02/25/23 EXERCISE LOG  Exercise Repetitions and Resistance Comments  Nustep L3 x 12 minutes    Standing HS curl  30 reps  LLE only   Lunges onto step  6" step x 2 minutes  LLE only   LAQ 2 minutes LLE only   Standing hip ABD w/ toe tap  2 minutes   Thomas stretch Attempted, but limited due to pain    Supine bent knee fall out  2.5 minutes  Alternating LE   SL hip ABD  30 reps   LLE only   SLR  30 reps  LLE only    Blank cell = exercise not performed today  Manual Therapy Soft Tissue Mobilization: left lateral quadriceps, for reduced pain and tone    PATIENT EDUCATION:  Education details: Plan of care, prognosis, objective findings, and goals for therapy Person educated: Patient Education method: Explanation Education comprehension: verbalized understanding  HOME EXERCISE PROGRAM:   ASSESSMENT:  CLINICAL IMPRESSION: Patient was introduced to resisted pull down for improved lumbar stability. He required minimal cueing with today's interventions for proper pacing as he attempted to rush through these interventions.Manual therapy focused on soft tissue mobilization to his left iliotibial band for reduced pain with minimal effectiveness. He reported that he was still hurting "a little" upon the conclusion of treatment. He continues to require skilled physical therapy to address his remaining impairments to return to his prior level of function.   OBJECTIVE IMPAIRMENTS: decreased activity tolerance, decreased mobility, difficulty walking, decreased ROM, decreased strength, impaired tone, and pain.   ACTIVITY LIMITATIONS: carrying, lifting, standing, squatting, and locomotion  level  PARTICIPATION LIMITATIONS: meal prep, cleaning, laundry, shopping, community activity, and occupation  PERSONAL FACTORS: Past/current experiences, Time since onset of injury/illness/exacerbation, and 3+ comorbidities: Hypertension, diabetes, COPD, diabetes, and osteoarthritis  are also affecting patient's functional outcome.   REHAB POTENTIAL: Fair    CLINICAL DECISION MAKING: Evolving/moderate complexity  EVALUATION COMPLEXITY: Moderate   GOALS: Goals reviewed with patient? Yes  LONG TERM GOALS: Target date: 03/20/23  Patient will be independent with his HEP. Baseline:  Goal status: INITIAL  2.  Patient will be able to complete his daily activities without his familiar pain exceeding 5/10. Baseline:  Goal status: INITIAL  3.  Patient will improve his hip flexor strength bilaterally to at least 4/5 for improved lower extremity strength. Baseline:  Goal status: INITIAL  4.  Patient will be able to complete his critical job demands without being limited by his familiar symptoms. Baseline:  Goal status: INITIAL  PLAN:  PT FREQUENCY: 2x/week  PT DURATION: 4 weeks  PLANNED INTERVENTIONS: Therapeutic exercises,  Therapeutic activity, Neuromuscular re-education, Balance training, Patient/Family education, Self Care, Joint mobilization, Electrical stimulation, Spinal mobilization, Cryotherapy, Moist heat, Manual therapy, and Re-evaluation  PLAN FOR NEXT SESSION: NuStep, lumbar and lower extremity strengthening (bridging, straight leg raise, etc.), balance interventions, and modalities as needed   Granville Lewis, PT 03/03/2023, 2:38 PM

## 2023-03-04 ENCOUNTER — Encounter: Payer: Self-pay | Admitting: Physical Medicine & Rehabilitation

## 2023-03-04 ENCOUNTER — Encounter (HOSPITAL_BASED_OUTPATIENT_CLINIC_OR_DEPARTMENT_OTHER): Payer: Medicare (Managed Care) | Admitting: Physical Medicine & Rehabilitation

## 2023-03-04 VITALS — BP 110/68 | HR 78 | Ht 65.0 in | Wt 140.0 lb

## 2023-03-04 DIAGNOSIS — M25552 Pain in left hip: Secondary | ICD-10-CM | POA: Diagnosis not present

## 2023-03-04 DIAGNOSIS — Z5181 Encounter for therapeutic drug level monitoring: Secondary | ICD-10-CM | POA: Diagnosis not present

## 2023-03-04 MED ORDER — TRAMADOL HCL 50 MG PO TABS: 100.0000 mg | ORAL_TABLET | Freq: Two times a day (BID) | ORAL | 0 refills | Status: DC

## 2023-03-04 NOTE — Progress Notes (Signed)
Subjective:    Patient ID: Jimmy Craig, male    DOB: Sep 07, 1950, 72 y.o.   MRN: [id number]  HPI  72 yo male with hx of lumbar spinal stenosis with decompression per Dr Lovell Sheehan, the patient returns today with primary complaints of left-sided low back, buttock, hip and upper thigh pain.  To a lesser extent he has some left knee pain.  We reviewed x-rays of the hips as well as left knee which were unremarkable for any significant osteoarthritis.  He has just started physical therapy and has had a total of 3 treatments.  He states that he is scheduled for additional treatments. He does not feel that the increased dose of Lyrica has been helpful for him nor has he felt any significant benefit of the tramadol at 50 mg twice daily. The patient states he has not seen Dr. Lovell Sheehan since shortly after his surgery.  He thinks his surgery was about 2 years ago.  I reviewed his MRI from 2021 which appeared to be a preoperative study.   72 year old male with low back pain radiating down both legs for 3 months greater on the right. No known injury.   EXAM: MRI LUMBAR SPINE WITHOUT CONTRAST   TECHNIQUE: Multiplanar, multisequence MR imaging of the lumbar spine was performed. No intravenous contrast was administered.   COMPARISON:  Lumbar radiographs 12/07/2019.   FINDINGS: Segmentation:  Normal on the comparison.   Alignment: Preserved lumbar lordosis. Subtle anterolisthesis at L4-L5.   Vertebrae: No marrow edema or evidence of acute osseous abnormality. Chronic degenerative endplate signal changes at L5-S1. Normal background bone marrow signal. Intact visible sacrum and SI joints.   Conus medullaris and cauda equina: Conus extends to the T12-L1 level. No lower spinal cord or conus signal abnormality.   Paraspinal and other soft tissues: Negative.   Disc levels:   T11-T12 through L1-L2: Negative.   L2-L3: Disc space loss with circumferential disc bulge and superimposed broad-based  moderate size central disc extrusion with mild lobulation (series 5, image 8). Mild posterior element hypertrophy and epidural lipomatosis. Moderate spinal and left lateral recess stenosis (left L3 nerve level). Mild left L2 foraminal stenosis.   L3-L4: Circumferential but mostly far lateral disc bulging with mild to moderate posterior element hypertrophy. Mild spinal stenosis. Borderline to mild foraminal stenosis greater on the right.   L4-L5: Bulky circumferential disc bulge or protrusion. Severe posterior element degeneration including ligament flavum hypertrophy. Trace degenerative facet joint fluid on the right. Severe spinal and lateral recess stenosis (bilateral L5 nerve levels). Moderate right L4 foraminal stenosis. Borderline to mild on the left.   L5-S1: Disc space loss with vacuum disc. Circumferential disc osteophyte complex and mild to moderate posterior element hypertrophy. No spinal stenosis. Mild to moderate lateral recess stenosis greater on the right (S1 nerve levels). Moderate left L5 foraminal stenosis.   IMPRESSION: 1. Severe multifactorial spinal and lateral recess stenosis at L4-L5 with moderate right foraminal stenosis. Query L5 radiculitis. 2. Moderate spinal and left lateral recess stenosis at L2-L3 related to central disc herniation. Mild multifactorial spinal stenosis at L3-L4. 3. 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     Electronically Signed   By: Odessa Fleming M.D.   On: 01/20/2020 13:17  Chronic left lateral hip pain, trochanteric area. Decreased internal range of motion.   EXAM: MR OF THE LEFT HIP WITHOUT CONTRAST   TECHNIQUE: Multiplanar, multisequence MR imaging was performed. No intravenous contrast was administered.  COMPARISON:  X-ray hip 07/03/2021.   FINDINGS: Bones: There is no evidence of acute fracture, dislocation or avascular necrosis. No focal bone lesion.  The visualized sacroiliac joints and symphysis pubis appear normal.   Articular cartilage and labrum   Articular cartilage: Mild partial-thickness cartilage loss in both hip joints with small subchondral cysts in both acetabular roofs.   Labrum: Fraying and small tear of the left posterosuperior labrum (series 12, image 13). Probable small tear of the right superior labrum (series 9, image 19). No paralabral abnormality.   Joint or bursal effusion   Joint effusion: No significant hip joint effusion.   Bursae: No focal periarticular fluid collection.   Muscles and tendons   Muscles and tendons: The visualized gluteus, hamstring and iliopsoas tendons appear normal. No muscle edema or atrophy.   Other findings   Miscellaneous: The visualized internal pelvic contents appear unremarkable.   IMPRESSION: 1. No trochanteric bursitis. 2. Mild bilateral hip osteoarthritis. 3. Fraying and small tear of the left posterosuperior labrum. 4. Probable small tear of the right superior labrum.     Electronically Signed   By: Obie Dredge M.D.   On: 09/06/2021 09:32 Pain Inventory Average Pain 7 Pain Right Now 7 My pain is sharp  In the last 24 hours, has pain interfered with the following? General activity 4 Relation with others 0 Enjoyment of life 5 What TIME of day is your pain at its worst? varies Sleep (in general) Fair  Pain is worse with: walking and some activites Pain improves with: medication Relief from Meds: 0  Family History  Problem Relation Age of Onset   Diabetes Mother    Social History   Socioeconomic History   Marital status: Married    Spouse name: Not on file   Number of children: Not on file   Years of education: Not on file   Highest education level: Master's degree (e.g., MA, MS, MEng, MEd, MSW, MBA)  Occupational History   Not on file  Tobacco Use   Smoking status: Never   Smokeless tobacco: Never  Vaping Use   Vaping status: Never Used   Substance and Sexual Activity   Alcohol use: No   Drug use: No   Sexual activity: Yes    Birth control/protection: None  Other Topics Concern   Not on file  Social History Narrative   Not on file   Social Determinants of Health   Financial Resource Strain: Medium Risk (12/03/2022)   Overall Financial Resource Strain (CARDIA)    Difficulty of Paying Living Expenses: Somewhat hard  Food Insecurity: No Food Insecurity (12/03/2022)   Hunger Vital Sign    Worried About Running Out of Food in the Last Year: Never true    Ran Out of Food in the Last Year: Never true  Transportation Needs: No Transportation Needs (12/03/2022)   PRAPARE - Administrator, Civil Service (Medical): No    Lack of Transportation (Non-Medical): No  Physical Activity: Insufficiently Active (12/03/2022)   Exercise Vital Sign    Days of Exercise per Week: 3 days    Minutes of Exercise per Session: 10 min  Stress: Stress Concern Present (12/03/2022)   [employer] of Occupational Health - Occupational Stress Questionnaire    Feeling of Stress : To some extent  Social Connections: Moderately Integrated (12/03/2022)   Social Connection and Isolation Panel [NHANES]    Frequency of Communication with Friends and Family: Three times a week    Frequency of Social  Gatherings with Friends and Family: Three times a week    Attends Religious Services: 1 to 4 times per year    Active Member of Clubs or Organizations: No    Attends Engineer, structural: Not on file    Marital Status: Married   Past Surgical History:  Procedure Laterality Date   CATARACT EXTRACTION Right    CATARACT EXTRACTION W/PHACO Left 12/20/2013   Procedure: CATARACT EXTRACTION PHACO AND INTRAOCULAR LENS PLACEMENT (IOC);  Surgeon: Susa Simmonds, MD;  Location: [facility];  Service: Ophthalmology;  Laterality: Left;  CDE: 0.97   Past Surgical History:  Procedure Laterality Date   CATARACT EXTRACTION Right    CATARACT EXTRACTION  W/PHACO Left 12/20/2013   Procedure: CATARACT EXTRACTION PHACO AND INTRAOCULAR LENS PLACEMENT (IOC);  Surgeon: Susa Simmonds, MD;  Location: [facility];  Service: Ophthalmology;  Laterality: Left;  CDE: 0.97   Past Medical History:  Diagnosis Date   Arthritis    BPH (benign prostatic hyperplasia)    Chronic back pain    GERD (gastroesophageal reflux disease)    Hypertension    Mixed hyperlipidemia    Type 2 diabetes mellitus (HCC)    BP 110/68   Pulse 78   Ht 5\' 5"  (1.651 m)   Wt 140 lb (63.5 kg)   SpO2 96%   BMI 23.30 kg/m   Opioid Risk Score:   Fall Risk Score:  `1  Depression screen PHQ 2/9     12/20/2022    2:30 PM 08/13/2022    1:41 PM 05/13/2022    3:23 PM 01/29/2022   11:05 AM 10/22/2021    1:49 PM 04/23/2021    2:02 PM 01/08/2021    2:48 PM  Depression screen PHQ 2/9  Decreased Interest 1 0 0 0 0 0 0  Down, Depressed, Hopeless 0 0 0 0 0 0 0  PHQ - 2 Score 1 0 0 0 0 0 0  Altered sleeping 1 0  0  0 0  Tired, decreased energy 2 0  2  1 0  Change in appetite 0 0  0  0 0  Feeling bad or failure about yourself  0 0  0  0 0  Trouble concentrating 1 0  1   0  Moving slowly or fidgety/restless 2 0  0  0 0  Suicidal thoughts 0 0  0  0 0  PHQ-9 Score 7 0  3  1 0  Difficult doing work/chores Somewhat difficult Not difficult at all  Somewhat difficult  Not difficult at all Not difficult at all      Review of Systems  Musculoskeletal:  Positive for back pain.       Left thigh, knee pain  All other systems reviewed and are negative.     Objective:   Physical Exam  General no acute distress Mood and affect appropriate There is good range of motion at hip internal/external rotation.  No pain with these maneuvers. The patient has pain with lumbar flexion and extension.  He has negative straight leg raise Normal strength in the lower extremities. Ambulates without assistive device no evidence of toe drag or knee instability.      Assessment & Plan:   1.  Lumbar  postlaminectomy syndrome with chronic lower extremity paresthesias will increase Lyrica to 150 mg 3 times daily and monitor for drowsiness.  He does have foraminal stenosis on the left side graded as moderate at L2-3 that may be a pain generator  for his symptoms.  Consider epidural injection at that level if no improvement from physical therapy. 2.  Left hip/groin pain, pain with range of motion on examination.  The patient has labral tears mild as well as mild OA which may be causing his issues.  If he does not respond to physical therapy may consider intra-articular hip injection under fluoroscopy guidance  3.  Left knee pain difficult to say whether this is radiating from the hip.  His knee exam is fairly unremarkable except for medial and lateral joint line tenderness.  I suspect he may have some meniscal degeneration essentially negative x-ray. Given his activity related pain we will increase tramadol 100 mg twice daily as a trial.  We will need to do urine drug screen as well as controlled substance agreement. He complains of reduced mobility does have some hip joint restriction continue physical therapy.

## 2023-03-04 NOTE — Patient Instructions (Signed)
Will increase dose of tramadol , finish PT and see you next month to see if you have improved

## 2023-03-06 ENCOUNTER — Other Ambulatory Visit: Payer: Self-pay | Admitting: Family

## 2023-03-07 ENCOUNTER — Ambulatory Visit (INDEPENDENT_AMBULATORY_CARE_PROVIDER_SITE_OTHER): Payer: Medicare (Managed Care) | Admitting: Family

## 2023-03-07 ENCOUNTER — Ambulatory Visit: Payer: Medicare (Managed Care) | Attending: Physical Medicine & Rehabilitation

## 2023-03-07 ENCOUNTER — Encounter: Payer: Self-pay | Admitting: Family

## 2023-03-07 VITALS — BP 119/69 | HR 68 | Temp 97.0°F | Ht 65.0 in | Wt 139.2 lb

## 2023-03-07 DIAGNOSIS — M5442 Lumbago with sciatica, left side: Secondary | ICD-10-CM

## 2023-03-07 DIAGNOSIS — M6281 Muscle weakness (generalized): Secondary | ICD-10-CM | POA: Diagnosis not present

## 2023-03-07 DIAGNOSIS — E1169 Type 2 diabetes mellitus with other specified complication: Secondary | ICD-10-CM | POA: Diagnosis not present

## 2023-03-07 DIAGNOSIS — K219 Gastro-esophageal reflux disease without esophagitis: Secondary | ICD-10-CM

## 2023-03-07 DIAGNOSIS — G8929 Other chronic pain: Secondary | ICD-10-CM | POA: Diagnosis not present

## 2023-03-07 DIAGNOSIS — E785 Hyperlipidemia, unspecified: Secondary | ICD-10-CM

## 2023-03-07 DIAGNOSIS — R262 Difficulty in walking, not elsewhere classified: Secondary | ICD-10-CM | POA: Diagnosis not present

## 2023-03-07 DIAGNOSIS — J449 Chronic obstructive pulmonary disease, unspecified: Secondary | ICD-10-CM | POA: Diagnosis not present

## 2023-03-07 DIAGNOSIS — E1159 Type 2 diabetes mellitus with other circulatory complications: Secondary | ICD-10-CM | POA: Diagnosis not present

## 2023-03-07 DIAGNOSIS — E1142 Type 2 diabetes mellitus with diabetic polyneuropathy: Secondary | ICD-10-CM | POA: Diagnosis not present

## 2023-03-07 DIAGNOSIS — M159 Polyosteoarthritis, unspecified: Secondary | ICD-10-CM | POA: Diagnosis not present

## 2023-03-07 DIAGNOSIS — M25552 Pain in left hip: Secondary | ICD-10-CM | POA: Diagnosis not present

## 2023-03-07 DIAGNOSIS — N401 Enlarged prostate with lower urinary tract symptoms: Secondary | ICD-10-CM | POA: Diagnosis not present

## 2023-03-07 DIAGNOSIS — Z794 Long term (current) use of insulin: Secondary | ICD-10-CM

## 2023-03-07 DIAGNOSIS — I152 Hypertension secondary to endocrine disorders: Secondary | ICD-10-CM

## 2023-03-07 DIAGNOSIS — R3916 Straining to void: Secondary | ICD-10-CM | POA: Diagnosis not present

## 2023-03-07 DIAGNOSIS — M5459 Other low back pain: Secondary | ICD-10-CM | POA: Diagnosis not present

## 2023-03-07 DIAGNOSIS — G629 Polyneuropathy, unspecified: Secondary | ICD-10-CM | POA: Diagnosis not present

## 2023-03-07 LAB — BAYER DCA HB A1C WAIVED: HB A1C (BAYER DCA - WAIVED): 7.2 % — ABNORMAL HIGH (ref 4.8–5.6)

## 2023-03-07 MED ORDER — BLOOD GLUCOSE TEST VI STRP
ORAL_STRIP | 3 refills | Status: DC
Start: 2023-03-07 — End: 2023-12-22

## 2023-03-07 MED ORDER — TAMSULOSIN HCL 0.4 MG PO CAPS
0.4000 mg | ORAL_CAPSULE | Freq: Two times a day (BID) | ORAL | 3 refills | Status: DC
Start: 2023-03-07 — End: 2023-05-30

## 2023-03-07 NOTE — Patient Instructions (Signed)

## 2023-03-07 NOTE — Progress Notes (Signed)
Subjective:    Patient ID: Jimmy Craig, male    DOB: 12/01/1950, 72 y.o.   MRN: [id number]  Chief Complaint  Patient presents with   Medical Management of Chronic Issues   Pt presents to the office today for chronic follow up. He has chronic back pain and has seen Neurosurgeon in the past. He had lumbar surgery on  11/22/20.  He had a MRI in 01/2020. He reports his leg pain is a pain of 5 out 10 when walking but resolves when resting. He is doing PT and followed by Pain Management.    He is followed by Ortho and had a steroid injection in left hip that did not help.    He has COPD with intermittent SOB and congestion. States the congestion is worsening and the albuterol is not working.   Hypertension This is a chronic problem. The current episode started more than 1 year ago. The problem has been resolved since onset. The problem is controlled. Pertinent negatives include no blurred vision, malaise/fatigue, peripheral edema or shortness of breath. Risk factors for coronary artery disease include diabetes mellitus, dyslipidemia, male gender and sedentary lifestyle. The current treatment provides moderate improvement.  Gastroesophageal Reflux He complains of belching and heartburn. This is a chronic problem. The current episode started more than 1 year ago. The problem occurs occasionally. He has tried a PPI for the symptoms. The treatment provided moderate relief.  Hyperlipidemia This is a chronic problem. The current episode started more than 1 year ago. The problem is uncontrolled. Recent lipid tests were reviewed and are high. Pertinent negatives include no shortness of breath. Current antihyperlipidemic treatment includes statins. The current treatment provides moderate improvement of lipids. Risk factors for coronary artery disease include dyslipidemia, diabetes mellitus, hypertension, male sex and a sedentary lifestyle.  Diabetes He presents for his follow-up diabetic visit. He has  type 2 diabetes mellitus. Associated symptoms include foot paresthesias. Pertinent negatives for diabetes include no blurred vision. Diabetic complications include peripheral neuropathy. Risk factors for coronary artery disease include dyslipidemia, diabetes mellitus, hypertension, male sex and sedentary lifestyle. He is following a generally healthy diet. His overall blood glucose range is 130-140 mg/dl. Eye exam is current.  Back Pain This is a chronic problem. The current episode started more than 1 year ago. The problem occurs intermittently. The pain is present in the lumbar spine. The quality of the pain is described as aching. The pain is moderate. He has tried home exercises and analgesics for the symptoms. The treatment provided mild relief.  Arthritis Presents for follow-up visit. He complains of pain and stiffness. Affected locations include the right knee and left knee.  Benign Prostatic Hypertrophy This is a chronic problem. The current episode started more than 1 year ago. Irritative symptoms include nocturia.      Review of Systems  Constitutional:  Negative for malaise/fatigue.  Eyes:  Negative for blurred vision.  Respiratory:  Negative for shortness of breath.   Gastrointestinal:  Positive for heartburn.  Genitourinary:  Positive for nocturia.  Musculoskeletal:  Positive for arthritis, back pain and stiffness.  All other systems reviewed and are negative.      Objective:   Physical Exam Vitals reviewed.  Constitutional:      General: He is not in acute distress.    Appearance: He is well-developed.  HENT:     Head: Normocephalic.     Right Ear: Tympanic membrane normal.     Left Ear: Tympanic membrane normal.  Eyes:  General:        Right eye: No discharge.        Left eye: No discharge.     Pupils: Pupils are equal, round, and reactive to light.  Neck:     Thyroid: No thyromegaly.  Cardiovascular:     Rate and Rhythm: Normal rate and regular rhythm.      Heart sounds: Normal heart sounds. No murmur heard. Pulmonary:     Effort: Pulmonary effort is normal. No respiratory distress.     Breath sounds: Normal breath sounds. No wheezing.  Abdominal:     General: Bowel sounds are normal. There is no distension.     Palpations: Abdomen is soft.     Tenderness: There is no abdominal tenderness.  Musculoskeletal:        General: No tenderness. Normal range of motion.     Cervical back: Normal range of motion and neck supple.  Skin:    General: Skin is warm and dry.     Findings: No erythema or rash.  Neurological:     Mental Status: He is alert and oriented to person, place, and time.     Cranial Nerves: No cranial nerve deficit.     Deep Tendon Reflexes: Reflexes are normal and symmetric.  Psychiatric:        Behavior: Behavior normal.        Thought Content: Thought content normal.        Judgment: Judgment normal.     Diabetic Foot Exam - Simple   Simple Foot Form Diabetic Foot exam was performed with the following findings: Yes 03/07/2023  1:43 PM  Visual Inspection No deformities, no ulcerations, no other skin breakdown bilaterally: Yes Sensation Testing Intact to touch and monofilament testing bilaterally: Yes Pulse Check Posterior Tibialis and Dorsalis pulse intact bilaterally: Yes Comments      BP 119/69   Pulse 68   Temp (!) 97 F (36.1 C) (Temporal)   Ht 5\' 5"  (1.651 m)   Wt 139 lb 3.2 oz (63.1 kg)   SpO2 97%   BMI 23.16 kg/m      Assessment & Plan:   Jimmy Craig comes in today with chief complaint of Medical Management of Chronic Issues   Diagnosis and orders addressed:  1. Chronic low back pain with left-sided sciatica, unspecified back pain laterality - CMP14+EGFR  2. Neuropathy - CMP14+EGFR  3. Benign prostatic hyperplasia (BPH) with straining on urination - CMP14+EGFR - tamsulosin (FLOMAX) 0.4 MG CAPS capsule; Take 1 capsule (0.4 mg total) by mouth in the morning and at bedtime.  Dispense: 180  capsule; Refill: 3  4. Chronic obstructive pulmonary disease, unspecified COPD type (HCC) - CMP14+EGFR  5. Type 2 diabetes mellitus with diabetic polyneuropathy, with long-term current use of insulin (HCC) - Microalbumin / creatinine urine ratio - Bayer DCA Hb A1c Waived - CMP14+EGFR - Glucose Blood (BLOOD GLUCOSE TEST STRIPS) STRP; Check BS TID in the morning, at noon and at bedtime Dx E11.9  Dispense: 300 each; Refill: 3  6. GERD without esophagitis - CMP14+EGFR  7. Hyperlipidemia associated with type 2 diabetes mellitus (HCC) - CMP14+EGFR  8. Hypertension associated with diabetes (HCC) - CMP14+EGFR  9. Primary osteoarthritis involving multiple joints - CMP14+EGFR   Labs pending Continue current medications  Keep chronic follow up Health Maintenance reviewed Diet and exercise encouraged  Follow up plan: 3 months    Jannifer Rodney, FNP

## 2023-03-07 NOTE — Therapy (Signed)
OUTPATIENT PHYSICAL THERAPY LOWER EXTREMITY TREATMENT   Patient Name: Jimmy Craig MRN: [id number] DOB:Jul 07, 1951, 72 y.o., male Today's Date: 03/07/2023  END OF SESSION:  PT End of Session - 03/07/23 1151     Visit Number 5    Number of Visits 8    Date for PT Re-Evaluation 05/02/23    PT Start Time 1150    PT Stop Time 1230    PT Time Calculation (min) 40 min    Activity Tolerance Patient tolerated treatment well    Behavior During Therapy [facility] for tasks assessed/performed                 Past Medical History:  Diagnosis Date   Arthritis    BPH (benign prostatic hyperplasia)    Chronic back pain    GERD (gastroesophageal reflux disease)    Hypertension    Mixed hyperlipidemia    Type 2 diabetes mellitus (HCC)    Past Surgical History:  Procedure Laterality Date   CATARACT EXTRACTION Right    CATARACT EXTRACTION W/PHACO Left 12/20/2013   Procedure: CATARACT EXTRACTION PHACO AND INTRAOCULAR LENS PLACEMENT (IOC);  Surgeon: Susa Simmonds, MD;  Location: [facility];  Service: Ophthalmology;  Laterality: Left;  CDE: 0.97   Patient Active Problem List   Diagnosis Date Noted   Retinal hole or tear, right 04/25/2022   Vitreous hemorrhage, right eye (HCC) 04/25/2022   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) 04/25/2022   Chronic obstructive pulmonary disease (HCC) 10/22/2021   Lumbago with sciatica, left side 03/16/2021   Constipation 06/01/2020   Osteoarthritis 04/26/2019   Hypertension associated with diabetes (HCC) 09/29/2018   Diabetes (HCC) 08/14/2018   Hyperlipidemia associated with type 2 diabetes mellitus (HCC) 08/14/2018   GERD without esophagitis 08/14/2018   BPH (benign prostatic hyperplasia) 08/14/2018   Iron deficiency anemia 08/14/2018   Spinal stenosis of lumbar region 08/08/2017   Dizzy spells 01/20/2015    PCP: Junie Spencer, FNP  REFERRING PROVIDER: Erick Colace, MD   REFERRING DIAG: Left  hip pain   THERAPY DIAG:  Other low back pain  Pain in left hip  Muscle weakness (generalized)  Rationale for Evaluation and Treatment: Rehabilitation  ONSET DATE: 2 years ago   SUBJECTIVE:   SUBJECTIVE STATEMENT: Patient reports that his hip and knee is hurting a little bit, but it is better.   PERTINENT HISTORY: Hypertension, diabetes, COPD, diabetes, and osteoarthritis PAIN:  Are you having pain? Yes: NPRS scale: no pain score provided/10 Pain location: left anterior hip radiating down to his ankle Pain description: intermittent sharp pain Aggravating factors: putting pressure on his left foot, walking, steps Relieving factors: sitting and laying down  PRECAUTIONS: None  RED FLAGS: None   WEIGHT BEARING RESTRICTIONS: No  FALLS:  Has patient fallen in last 6 months?  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  POSTURE: forward head and  decreased lumbar lordosis  PALPATION: TTP: L3-5 spinous processes, left hip flexors, TFL, and proximal quadriceps  LUMBAR ROM:   Active  A/PROM  eval  Flexion 50; familiar pain  Extension 10  Right lateral flexion   Left lateral flexion   Right rotation   Left rotation    (Blank rows = not tested)   LOWER EXTREMITY ROM:     Active  Right eval Left eval  Hip flexion 122; slight right hip pain  110; slight left hip  pain   Hip extension    Hip abduction    Hip adduction    Hip internal rotation  WFL (PROM)  Hip external rotation  Familiar pain with PROM   Knee flexion    Knee extension    Ankle dorsiflexion    Ankle plantarflexion    Ankle inversion    Ankle eversion     (Blank rows = not tested)   LOWER EXTREMITY MMT:  MMT Right eval Left eval  Hip flexion 4-/5 4-/5  Hip extension    Hip abduction    Hip adduction    Hip internal rotation    Hip external rotation    Knee flexion 4/5 4/5  Knee extension 5/5 5/5  Ankle dorsiflexion 4/5 4/5  Ankle plantarflexion    Ankle inversion    Ankle eversion     (Blank rows = not tested)  LOWER EXTREMITY SPECIAL TESTS:  Hip special tests: Luisa Hart (FABER) test: positive  and Hip scouring test: positive   GAIT: Assistive device utilized: None Level of assistance: Complete Independence  TODAY'S TREATMENT:                                                                                                                              DATE:                                     03/07/23 EXERCISE LOG  Exercise Repetitions and Resistance Comments  Nustep  L3 x 16 minutes   LAQ 4# x 30 reps each   Standing HS curl  4# x 30 reps each    Marching on foam  30 reps each  BUE support  Standing gastroc stretch 2 minute    Standing hip ABD  25 reps each   Squatting  20 reps    Lateral step up  6" step x 20 reps each    Resisted pull down  Blue XTS x 25 reps     Blank cell = exercise not performed today                                    03/03/23 EXERCISE LOG  Exercise Repetitions and Resistance Comments  Nustep  L3 x 15 minutes   Rocker board  3.5 minutes  Lunges onto step  20 reps each  12" step   LAQ 4# x 20 reps each    Seated marching  4# x 20 reps each    Seated clams  Green t-band x 25 reps   Seated hip ABD Green t-band x 25 reps each    Seated hip ADD isometric  25 reps w/ 3 second hold    Resisted pull down  Blue XTS x 25 reps     Blank  cell = exercise not performed today  Manual Therapy Soft Tissue Mobilization: left IT band, for reduced pain                                     02/28/23 EXERCISE LOG  Exercise Repetitions and Resistance Comments  Nustep  L3 x 15 minutes    Seated HS stretch  10 reps each w/ 10 second hold    LAQ 2# x 20 reps each    Standing hip flexion  2# x 2 minutes  Alternating LE   Standing HS curl  2# x 2 minutes Alternating LE   Standing hip ABD  2# x 2 minutes Alternating LE   Seated hip ADD isometric  3 minutes  w/ 5 second hold    Supine clam 2 minutes   SLR 20 reps each     Blank cell = exercise not performed today   PATIENT EDUCATION:  Education details: Plan of care, prognosis, objective findings, and goals for therapy Person educated: Patient Education method: Explanation Education comprehension: verbalized understanding  HOME EXERCISE PROGRAM:   ASSESSMENT:  CLINICAL IMPRESSION: Patient was progressed with multiple new standing interventions for improved hip strength and lower extremity mobility. He required moderate verbal and visual cueing with squatting for proper biomechanics to facilitate appropriate muscular engagement. He experienced no significant increase in pain or discomfort with any of today's interventions. He reported feeling "alright" upon the conclusion of treatment. He continues to require skilled physical therapy to address his remaining impairments to return to his prior level of function.   OBJECTIVE IMPAIRMENTS: decreased activity tolerance, decreased mobility, difficulty walking, decreased ROM, decreased strength, impaired tone, and pain.   ACTIVITY LIMITATIONS: carrying, lifting, standing, squatting, and locomotion level  PARTICIPATION LIMITATIONS: meal prep, cleaning, laundry, shopping, community activity, and occupation  PERSONAL FACTORS: Past/current experiences, Time since onset of injury/illness/exacerbation, and 3+ comorbidities: Hypertension,  diabetes, COPD, diabetes, and osteoarthritis  are also affecting patient's functional outcome.   REHAB POTENTIAL: Fair    CLINICAL DECISION MAKING: Evolving/moderate complexity  EVALUATION COMPLEXITY: Moderate   GOALS: Goals reviewed with patient? Yes  LONG TERM GOALS: Target date: 03/20/23  Patient will be independent with his HEP. Baseline:  Goal status: INITIAL  2.  Patient will be able to complete his daily activities without his familiar pain exceeding 5/10. Baseline:  Goal status: INITIAL  3.  Patient will improve his hip flexor strength bilaterally to at least 4/5 for improved lower extremity strength. Baseline:  Goal status: INITIAL  4.  Patient will be able to complete his critical job demands without being limited by his familiar symptoms. Baseline:  Goal status: INITIAL  PLAN:  PT FREQUENCY: 2x/week  PT DURATION: 4 weeks  PLANNED INTERVENTIONS: Therapeutic exercises, Therapeutic activity, Neuromuscular re-education, Balance training, Patient/Family education, Self Care, Joint mobilization, Electrical stimulation, Spinal mobilization, Cryotherapy, Moist heat, Manual therapy, and Re-evaluation  PLAN FOR NEXT SESSION: NuStep, lumbar and lower  extremity strengthening (bridging, straight leg raise, etc.), balance interventions, and modalities as needed   Granville Lewis, PT 03/07/2023, 1:12 PM

## 2023-03-10 ENCOUNTER — Encounter (INDEPENDENT_AMBULATORY_CARE_PROVIDER_SITE_OTHER): Payer: Medicare (Managed Care) | Admitting: Ophthalmology

## 2023-03-10 DIAGNOSIS — H43813 Vitreous degeneration, bilateral: Secondary | ICD-10-CM | POA: Diagnosis not present

## 2023-03-10 DIAGNOSIS — E113313 Type 2 diabetes mellitus with moderate nonproliferative diabetic retinopathy with macular edema, bilateral: Secondary | ICD-10-CM | POA: Diagnosis not present

## 2023-03-10 DIAGNOSIS — H33301 Unspecified retinal break, right eye: Secondary | ICD-10-CM

## 2023-03-10 DIAGNOSIS — Z7984 Long term (current) use of oral hypoglycemic drugs: Secondary | ICD-10-CM

## 2023-03-11 ENCOUNTER — Ambulatory Visit: Payer: Medicare (Managed Care)

## 2023-03-11 DIAGNOSIS — M5459 Other low back pain: Secondary | ICD-10-CM | POA: Diagnosis not present

## 2023-03-11 DIAGNOSIS — M25552 Pain in left hip: Secondary | ICD-10-CM

## 2023-03-11 DIAGNOSIS — M6281 Muscle weakness (generalized): Secondary | ICD-10-CM

## 2023-03-11 NOTE — Therapy (Signed)
OUTPATIENT PHYSICAL THERAPY LOWER EXTREMITY TREATMENT   Patient Name: Jimmy Craig MRN: [id number] DOB:07/22/1951, 72 y.o., male Today's Date: 03/11/2023  END OF SESSION:  PT End of Session - 03/11/23 1352     Visit Number 6    Number of Visits 8    Date for PT Re-Evaluation 05/02/23    PT Start Time 1345    PT Stop Time 1416   Patient requested to leave early due to not feeling well.   PT Time Calculation (min) 31 min    Activity Tolerance Patient tolerated treatment well    Behavior During Therapy WFL for tasks assessed/performed                  Past Medical History:  Diagnosis Date   Arthritis    BPH (benign prostatic hyperplasia)    Chronic back pain    GERD (gastroesophageal reflux disease)    Hypertension    Mixed hyperlipidemia    Type 2 diabetes mellitus (HCC)    Past Surgical History:  Procedure Laterality Date   CATARACT EXTRACTION Right    CATARACT EXTRACTION W/PHACO Left 12/20/2013   Procedure: CATARACT EXTRACTION PHACO AND INTRAOCULAR LENS PLACEMENT (IOC);  Surgeon: Susa Simmonds, MD;  Location: [facility];  Service: Ophthalmology;  Laterality: Left;  CDE: 0.97   Patient Active Problem List   Diagnosis Date Noted   Retinal hole or tear, right 04/25/2022   Vitreous hemorrhage, right eye (HCC) 04/25/2022   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) 04/25/2022   Chronic obstructive pulmonary disease (HCC) 10/22/2021   Lumbago with sciatica, left side 03/16/2021   Constipation 06/01/2020   Osteoarthritis 04/26/2019   Hypertension associated with diabetes (HCC) 09/29/2018   Diabetes (HCC) 08/14/2018   Hyperlipidemia associated with type 2 diabetes mellitus (HCC) 08/14/2018   GERD without esophagitis 08/14/2018   BPH (benign prostatic hyperplasia) 08/14/2018   Iron deficiency anemia 08/14/2018   Spinal stenosis of lumbar region 08/08/2017   Dizzy spells 01/20/2015    PCP: Junie Spencer,  FNP  REFERRING PROVIDER: Erick Colace, MD   REFERRING DIAG: Left hip pain   THERAPY DIAG:  Other low back pain  Pain in left hip  Muscle weakness (generalized)  Rationale for Evaluation and Treatment: Rehabilitation  ONSET DATE: 2 years ago   SUBJECTIVE:   SUBJECTIVE STATEMENT: Patient reports that he feels significantly better.   PERTINENT HISTORY: Hypertension, diabetes, COPD, diabetes, and osteoarthritis PAIN:  Are you having pain? Yes: NPRS scale: no pain score provided/10 Pain location: left anterior hip radiating down to his ankle Pain description: intermittent sharp pain Aggravating factors: putting pressure on his left foot, walking, steps Relieving factors: sitting and laying down  PRECAUTIONS: None  RED FLAGS: None   WEIGHT BEARING RESTRICTIONS: No  FALLS:  Has patient fallen in last 6 months?  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  POSTURE:  forward head and decreased lumbar lordosis  PALPATION: TTP: L3-5 spinous processes, left hip flexors, TFL, and proximal quadriceps  LUMBAR ROM:   Active  A/PROM  eval  Flexion 50; familiar pain  Extension 10  Right lateral flexion   Left lateral flexion   Right rotation   Left rotation    (Blank rows = not tested)   LOWER EXTREMITY ROM:     Active  Right eval Left eval  Hip flexion 122;  slight right hip pain  110; slight left hip pain   Hip extension    Hip abduction    Hip adduction    Hip internal rotation  WFL (PROM)  Hip external rotation  Familiar pain with PROM   Knee flexion    Knee extension    Ankle dorsiflexion    Ankle plantarflexion    Ankle inversion    Ankle eversion     (Blank rows = not tested)   LOWER EXTREMITY MMT:  MMT Right eval Left eval  Hip flexion 4-/5 4-/5  Hip extension    Hip abduction    Hip adduction    Hip internal rotation    Hip external rotation    Knee flexion 4/5 4/5  Knee extension 5/5 5/5  Ankle dorsiflexion 4/5 4/5  Ankle plantarflexion    Ankle inversion    Ankle eversion     (Blank rows = not tested)  LOWER EXTREMITY SPECIAL TESTS:  Hip special tests: Luisa Hart (FABER) test: positive  and Hip scouring test: positive   GAIT: Assistive device utilized: None Level of assistance: Complete Independence  TODAY'S TREATMENT:                                                                                                                              DATE:                                     03/11/23 EXERCISE LOG  Exercise Repetitions and Resistance Comments  Nustep  L4 x 16 minutes   Side stepping on foam  5 laps  BUE support   Standing hip flexion  2 minutes LLE only; BUE support  Static stance on foam 1.5 minutes  Intermittent UE support   Standing hip extension 15 reps  LLE only    Blank cell = exercise not performed today                                    03/07/23 EXERCISE LOG  Exercise Repetitions and Resistance Comments  Nustep  L3 x 16 minutes   LAQ 4# x 30 reps each   Standing HS curl  4# x 30 reps each    Marching on foam  30 reps each  BUE support  Standing gastroc stretch 2 minute  Standing hip ABD  25 reps each   Squatting  20 reps    Lateral step up  6" step x 20 reps each    Resisted pull down  Blue XTS x 25 reps     Blank cell = exercise not performed today                                     03/03/23 EXERCISE LOG  Exercise Repetitions and Resistance Comments  Nustep  L3 x 15 minutes   Rocker board  3.5 minutes    Lunges onto step  20 reps each  12" step   LAQ 4# x 20 reps each    Seated marching  4# x 20 reps each    Seated clams  Green t-band x 25 reps   Seated hip ABD Green t-band x 25 reps each    Seated hip ADD isometric  25 reps w/ 3 second hold    Resisted pull down  Blue XTS x 25 reps     Blank cell = exercise not performed today  Manual Therapy Soft Tissue Mobilization: left IT band, for reduced pain    PATIENT EDUCATION:  Education details: Plan of care, prognosis, objective findings, and goals for therapy Person educated: Patient Education method: Explanation Education comprehension: verbalized understanding  HOME EXERCISE PROGRAM:   ASSESSMENT:  CLINICAL IMPRESSION: Patient was introduced to static stance and side stepping on foam for improved lower extremity stability. He required minimal cueing with today's interventions for proper exercise performance. He reported that his left lateral hip was still hurting slightly at the conclusion of treatment. However, he requested to leave treatment early as he did not feel well. Recommend that he continue with skilled physical therapy to address his remaining impairments to return to his prior level of function.   OBJECTIVE IMPAIRMENTS: decreased activity tolerance, decreased mobility, difficulty walking, decreased ROM, decreased strength, impaired tone, and pain.   ACTIVITY LIMITATIONS: carrying, lifting, standing, squatting, and locomotion level  PARTICIPATION LIMITATIONS: meal prep, cleaning, laundry, shopping, community activity, and occupation  PERSONAL FACTORS: Past/current experiences, Time since onset of injury/illness/exacerbation, and 3+ comorbidities: Hypertension, diabetes, COPD, diabetes, and osteoarthritis  are also affecting patient's functional outcome.   REHAB POTENTIAL: Fair    CLINICAL  DECISION MAKING: Evolving/moderate complexity  EVALUATION COMPLEXITY: Moderate   GOALS: Goals reviewed with patient? Yes  LONG TERM GOALS: Target date: 03/20/23  Patient will be independent with his HEP. Baseline:  Goal status: INITIAL  2.  Patient will be able to complete his daily activities without his familiar pain exceeding 5/10. Baseline:  Goal status: INITIAL  3.  Patient will improve his hip flexor strength bilaterally to at least 4/5 for improved lower extremity strength. Baseline:  Goal status: INITIAL  4.  Patient will be able to complete his critical job demands without being limited by his familiar symptoms. Baseline:  Goal status: INITIAL  PLAN:  PT FREQUENCY: 2x/week  PT DURATION: 4 weeks  PLANNED INTERVENTIONS: Therapeutic exercises, Therapeutic activity, Neuromuscular re-education, Balance training, Patient/Family education, Self Care, Joint mobilization, Electrical stimulation, Spinal mobilization, Cryotherapy, Moist heat, Manual therapy, and Re-evaluation  PLAN FOR NEXT SESSION: NuStep, lumbar and lower extremity strengthening (bridging, straight leg raise, etc.), balance interventions, and modalities as needed   Granville Lewis, PT 03/11/2023, 2:23 PM

## 2023-03-13 ENCOUNTER — Other Ambulatory Visit: Payer: Self-pay | Admitting: Family

## 2023-03-13 DIAGNOSIS — E1142 Type 2 diabetes mellitus with diabetic polyneuropathy: Secondary | ICD-10-CM

## 2023-03-16 ENCOUNTER — Other Ambulatory Visit: Payer: Self-pay | Admitting: Family

## 2023-03-16 DIAGNOSIS — J449 Chronic obstructive pulmonary disease, unspecified: Secondary | ICD-10-CM

## 2023-03-18 ENCOUNTER — Ambulatory Visit: Payer: Medicare (Managed Care) | Admitting: *Deleted

## 2023-03-18 ENCOUNTER — Encounter: Payer: Self-pay | Admitting: *Deleted

## 2023-03-18 DIAGNOSIS — M6281 Muscle weakness (generalized): Secondary | ICD-10-CM

## 2023-03-18 DIAGNOSIS — R262 Difficulty in walking, not elsewhere classified: Secondary | ICD-10-CM

## 2023-03-18 DIAGNOSIS — M5459 Other low back pain: Secondary | ICD-10-CM

## 2023-03-18 DIAGNOSIS — M25552 Pain in left hip: Secondary | ICD-10-CM

## 2023-03-18 NOTE — Therapy (Signed)
OUTPATIENT PHYSICAL THERAPY LOWER EXTREMITY TREATMENT   Patient Name: Jimmy Craig MRN: [id number] DOB:23-Feb-1951, 72 y.o., male Today's Date: 03/18/2023  END OF SESSION:  PT End of Session - 03/18/23 1343     Visit Number 7    Number of Visits 8    Date for PT Re-Evaluation 05/02/23    PT Start Time 1343    PT Stop Time 1432    PT Time Calculation (min) 49 min                  Past Medical History:  Diagnosis Date   Arthritis    BPH (benign prostatic hyperplasia)    Chronic back pain    GERD (gastroesophageal reflux disease)    Hypertension    Mixed hyperlipidemia    Type 2 diabetes mellitus (HCC)    Past Surgical History:  Procedure Laterality Date   CATARACT EXTRACTION Right    CATARACT EXTRACTION W/PHACO Left 12/20/2013   Procedure: CATARACT EXTRACTION PHACO AND INTRAOCULAR LENS PLACEMENT (IOC);  Surgeon: Susa Simmonds, MD;  Location: [facility];  Service: Ophthalmology;  Laterality: Left;  CDE: 0.97   Patient Active Problem List   Diagnosis Date Noted   Retinal hole or tear, right 04/25/2022   Vitreous hemorrhage, right eye (HCC) 04/25/2022   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) 04/25/2022   Chronic obstructive pulmonary disease (HCC) 10/22/2021   Lumbago with sciatica, left side 03/16/2021   Constipation 06/01/2020   Osteoarthritis 04/26/2019   Hypertension associated with diabetes (HCC) 09/29/2018   Diabetes (HCC) 08/14/2018   Hyperlipidemia associated with type 2 diabetes mellitus (HCC) 08/14/2018   GERD without esophagitis 08/14/2018   BPH (benign prostatic hyperplasia) 08/14/2018   Iron deficiency anemia 08/14/2018   Spinal stenosis of lumbar region 08/08/2017   Dizzy spells 01/20/2015    PCP: Junie Spencer, FNP  REFERRING PROVIDER: Erick Colace, MD   REFERRING DIAG: Left hip pain   THERAPY DIAG:  Other low back pain  Pain in left hip  Muscle weakness  (generalized)  Difficulty in walking, not elsewhere classified  Rationale for Evaluation and Treatment: Rehabilitation  ONSET DATE: 2 years ago   SUBJECTIVE:   SUBJECTIVE STATEMENT: Patient reports that he has had some mild pain in the hip and LB after walking lately  PERTINENT HISTORY: Hypertension, diabetes, COPD, diabetes, and osteoarthritis PAIN:  Are you having pain? Yes: NPRS scale: no pain score provided/10 Pain location: left anterior hip radiating down to his ankle Pain description: intermittent sharp pain Aggravating factors: putting pressure on his left foot, walking, steps Relieving factors: sitting and laying down  PRECAUTIONS: None  RED FLAGS: None   WEIGHT BEARING RESTRICTIONS: No  FALLS:  Has patient fallen in last 6 months?  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  POSTURE: forward head and decreased lumbar lordosis  PALPATION: TTP: L3-5 spinous processes, left hip  flexors, TFL, and proximal quadriceps  LUMBAR ROM:   Active  A/PROM  eval  Flexion 50; familiar pain  Extension 10  Right lateral flexion   Left lateral flexion   Right rotation   Left rotation    (Blank rows = not tested)   LOWER EXTREMITY ROM:     Active  Right eval Left eval  Hip flexion 122; slight right hip pain  110; slight left hip pain   Hip extension    Hip  abduction    Hip adduction    Hip internal rotation  WFL (PROM)  Hip external rotation  Familiar pain with PROM   Knee flexion    Knee extension    Ankle dorsiflexion    Ankle plantarflexion    Ankle inversion    Ankle eversion     (Blank rows = not tested)   LOWER EXTREMITY MMT:  MMT Right eval Left eval  Hip flexion 4-/5 4-/5  Hip extension    Hip abduction    Hip adduction    Hip internal rotation    Hip external rotation    Knee flexion 4/5 4/5  Knee extension 5/5 5/5  Ankle dorsiflexion 4/5 4/5  Ankle plantarflexion    Ankle inversion    Ankle eversion     (Blank rows = not tested)  LOWER EXTREMITY SPECIAL TESTS:  Hip special tests: Luisa Hart (FABER) test: positive  and Hip scouring test: positive   GAIT: Assistive device utilized: None Level of assistance: Complete Independence  TODAY'S TREATMENT:                                                                                                                              DATE:                                     03/18/23 EXERCISE LOG  Exercise Repetitions and Resistance Comments  Nustep  L4 x 16 minutes   Side stepping on foam   BUE support   Standing hip flexion   LLE only; BUE support  Static stance on foam  Intermittent UE support   Standing hip extension  LLE only   Bridging 3 x15 hold 5 secs   SLR 3x 15  LT LE with RT knee bent   Side lying hip ABD 3x15  LT LE   Side lying Clamshell 3x15    LT LE   Piriformis stretch x 5 hold 30 secs   Standing hip flexor stretch X 5 hold 10 Verbal cues and demonstration needed x 2   Blank cell = exercise not performed today   Discussed sleeping side lying with pillow between his legs to decrease LT hip and back  pain when RT side lying  03/07/23 EXERCISE LOG  Exercise Repetitions and Resistance Comments  Nustep  L3 x 16 minutes   LAQ 4# x 30 reps each   Standing HS curl  4# x 30 reps each    Marching on foam  30 reps each  BUE  support  Standing gastroc stretch 2 minute    Standing hip ABD  25 reps each   Squatting  20 reps    Lateral step up  6" step x 20 reps each    Resisted pull down  Blue XTS x 25 reps     Blank cell = exercise not performed today                                    03/03/23 EXERCISE LOG  Exercise Repetitions and Resistance Comments  Nustep  L3 x 15 minutes   Rocker board  3.5 minutes    Lunges onto step  20 reps each  12" step   LAQ 4# x 20 reps each    Seated marching  4# x 20 reps each    Seated clams  Green t-band x 25 reps   Seated hip ABD Green t-band x 25 reps each    Seated hip ADD isometric  25 reps w/ 3 second hold    Resisted pull down  Blue XTS x 25 reps     Blank cell = exercise not performed today  Manual Therapy Soft Tissue Mobilization: left IT band, for reduced pain    PATIENT EDUCATION:  Education details: Plan of care, prognosis, objective findings, and goals for therapy Person educated: Patient Education method: Explanation Education comprehension: verbalized understanding  HOME EXERCISE PROGRAM:   ASSESSMENT:  CLINICAL IMPRESSION: Pt arrived today doing fairly well , but with mild pain in LT hip and reports some LBP and LT hip pain after walking. Rx focused on Mat exs for lat hip strengthening as well as core strengthening. Pt did well with Exs with mainly mm fatigue and no increase in end of session. Piriformis stretch performed and did well, but minimal progression with standing with standing hip flexor stretch.  OBJECTIVE IMPAIRMENTS: decreased activity tolerance, decreased mobility, difficulty walking, decreased ROM, decreased strength, impaired tone, and pain.   ACTIVITY LIMITATIONS: carrying, lifting, standing, squatting, and locomotion level  PARTICIPATION LIMITATIONS: meal prep, cleaning, laundry, shopping, community activity, and occupation  PERSONAL FACTORS: Past/current experiences, Time since onset of injury/illness/exacerbation, and 3+  comorbidities: Hypertension, diabetes, COPD, diabetes, and osteoarthritis  are also affecting patient's functional outcome.   REHAB POTENTIAL: Fair    CLINICAL DECISION MAKING: Evolving/moderate complexity  EVALUATION COMPLEXITY: Moderate   GOALS: Goals reviewed with patient? Yes  LONG TERM GOALS: Target date: 03/20/23  Patient will be independent with his HEP. Baseline:  Goal status: INITIAL  2.  Patient will be able to complete his daily activities without his familiar pain exceeding 5/10. Baseline:  Goal status: INITIAL  3.  Patient will improve his hip flexor strength bilaterally to at least 4/5 for improved lower extremity strength. Baseline:  Goal status: INITIAL  4.  Patient will be able to complete his critical job demands without being limited by his familiar symptoms. Baseline:  Goal status: INITIAL  PLAN:  PT FREQUENCY: 2x/week  PT DURATION: 4 weeks  PLANNED INTERVENTIONS: Therapeutic exercises, Therapeutic activity, Neuromuscular re-education, Balance training, Patient/Family education, Self Care, Joint mobilization, Electrical stimulation, Spinal mobilization, Cryotherapy, Moist heat, Manual therapy, and  Re-evaluation  PLAN FOR NEXT SESSION: NuStep, lumbar and lower extremity strengthening (bridging, straight leg raise, etc.), balance interventions, and modalities as needed   ,CHRIS, PTA 03/18/2023, 4:51 PM

## 2023-03-19 ENCOUNTER — Other Ambulatory Visit: Payer: Self-pay | Admitting: Family

## 2023-03-19 ENCOUNTER — Telehealth: Payer: Self-pay | Admitting: *Deleted

## 2023-03-19 DIAGNOSIS — Z794 Long term (current) use of insulin: Secondary | ICD-10-CM

## 2023-03-19 NOTE — Telephone Encounter (Signed)
Fax from [facility] RE: Jimmy Craig KwikPen 100 u/ml Note: insurance now prefers [place] Please advise and send new script

## 2023-03-20 ENCOUNTER — Ambulatory Visit: Payer: Medicare (Managed Care)

## 2023-03-20 DIAGNOSIS — M5459 Other low back pain: Secondary | ICD-10-CM

## 2023-03-20 DIAGNOSIS — M6281 Muscle weakness (generalized): Secondary | ICD-10-CM

## 2023-03-20 DIAGNOSIS — M25552 Pain in left hip: Secondary | ICD-10-CM

## 2023-03-20 MED ORDER — TRESIBA FLEXTOUCH 200 UNIT/ML ~~LOC~~ SOPN: 42.0000 [IU] | PEN_INJECTOR | Freq: Every day | SUBCUTANEOUS | 2 refills | Status: DC

## 2023-03-20 NOTE — Addendum Note (Signed)
Addended by: Jannifer Rodney A on: 03/20/2023 03:21 PM   Modules accepted: Orders

## 2023-03-20 NOTE — Telephone Encounter (Signed)
Prescriptions sent

## 2023-03-20 NOTE — Telephone Encounter (Signed)
Prescription sent to pharmacy.

## 2023-03-20 NOTE — Therapy (Signed)
OUTPATIENT PHYSICAL THERAPY LOWER EXTREMITY TREATMENT   Patient Name: Jimmy Craig MRN: [id number] DOB:11/16/1950, 72 y.o., male Today's Date: 03/20/2023  END OF SESSION:  PT End of Session - 03/20/23 1348     Visit Number 8    Number of Visits 12    Date for PT Re-Evaluation 05/02/23    PT Start Time 1345    PT Stop Time 1427    PT Time Calculation (min) 42 min    Activity Tolerance Patient tolerated treatment well    Behavior During Therapy WFL for tasks assessed/performed                   Past Medical History:  Diagnosis Date   Arthritis    BPH (benign prostatic hyperplasia)    Chronic back pain    GERD (gastroesophageal reflux disease)    Hypertension    Mixed hyperlipidemia    Type 2 diabetes mellitus (HCC)    Past Surgical History:  Procedure Laterality Date   CATARACT EXTRACTION Right    CATARACT EXTRACTION W/PHACO Left 12/20/2013   Procedure: CATARACT EXTRACTION PHACO AND INTRAOCULAR LENS PLACEMENT (IOC);  Surgeon: Susa Simmonds, MD;  Location: [facility];  Service: Ophthalmology;  Laterality: Left;  CDE: 0.97   Patient Active Problem List   Diagnosis Date Noted   Retinal hole or tear, right 04/25/2022   Vitreous hemorrhage, right eye (HCC) 04/25/2022   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) 04/25/2022   Chronic obstructive pulmonary disease (HCC) 10/22/2021   Lumbago with sciatica, left side 03/16/2021   Constipation 06/01/2020   Osteoarthritis 04/26/2019   Hypertension associated with diabetes (HCC) 09/29/2018   Diabetes (HCC) 08/14/2018   Hyperlipidemia associated with type 2 diabetes mellitus (HCC) 08/14/2018   GERD without esophagitis 08/14/2018   BPH (benign prostatic hyperplasia) 08/14/2018   Iron deficiency anemia 08/14/2018   Spinal stenosis of lumbar region 08/08/2017   Dizzy spells 01/20/2015    PCP: Junie Spencer, FNP  REFERRING PROVIDER: Erick Colace, MD   REFERRING DIAG:  Left hip pain   THERAPY DIAG:  Other low back pain  Pain in left hip  Muscle weakness (generalized)  Rationale for Evaluation and Treatment: Rehabilitation  ONSET DATE: 2 years ago   SUBJECTIVE:   SUBJECTIVE STATEMENT: Patient reports that he feels better, but it is still not going away. He notes that he can now walk about 10-15 minutes before he begins having pain. However, he still has some pain when sleeping on his right side.   PERTINENT HISTORY: Hypertension, diabetes, COPD, diabetes, and osteoarthritis PAIN:  Are you having pain? Yes: NPRS scale: no pain score provided/10 Pain location: left anterior hip radiating down to his ankle Pain description: intermittent sharp pain Aggravating factors: putting pressure on his left foot, walking, steps Relieving factors: sitting and laying down  PRECAUTIONS: None  RED FLAGS: None   WEIGHT BEARING RESTRICTIONS: No  FALLS:  Has patient fallen in last 6 months?  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  POSTURE: forward head and decreased lumbar lordosis  PALPATION: TTP: L3-5 spinous processes, left hip flexors, TFL, and proximal quadriceps  LUMBAR ROM:   Active  A/PROM  eval  Flexion 50; familiar pain  Extension 10  Right lateral flexion   Left lateral flexion   Right rotation   Left rotation    (Blank rows =  not tested)   LOWER EXTREMITY ROM:     Active  Right eval Left eval  Hip flexion 122; slight right hip pain  110; slight left hip pain   Hip extension    Hip abduction    Hip adduction    Hip internal rotation  WFL (PROM)  Hip external rotation  Familiar pain with PROM   Knee flexion    Knee extension    Ankle dorsiflexion    Ankle plantarflexion    Ankle inversion    Ankle eversion     (Blank rows = not tested)   LOWER EXTREMITY MMT:  MMT Right eval Right 03/20/23 Left eval Left 03/20/23  Hip flexion 4-/5 4/5 4-/5 4-/5  Hip extension      Hip abduction      Hip adduction      Hip internal rotation      Hip external rotation      Knee flexion 4/5  4/5   Knee extension 5/5  5/5   Ankle dorsiflexion 4/5  4/5   Ankle plantarflexion      Ankle inversion      Ankle eversion       (Blank rows = not tested)  LOWER EXTREMITY SPECIAL TESTS:  Hip special tests: Luisa Hart (FABER) test: positive  and Hip scouring test: positive   GAIT: Assistive device utilized: None Level of assistance: Complete Independence  TODAY'S TREATMENT:                                                                                                                              DATE:                                     03/20/23 EXERCISE LOG  Exercise Repetitions and Resistance Comments  Nustep  L4 x 17 minutes   L SLR  2# x 25 reps    Supine hip ABD  25 reps  LLE only   Bent knee fall out  20 reps each    Standing hip extension  20 reps each     Blank cell = exercise not performed today                                    03/18/23 EXERCISE LOG  Exercise Repetitions and Resistance  Comments  Nustep  L4 x 16 minutes   Side stepping on foam   BUE support   Standing hip flexion   LLE only; BUE support  Static stance on foam  Intermittent UE support   Standing hip extension  LLE only   Bridging 3 x15 hold 5 secs   SLR 3x 15  LT LE with RT knee bent   Side lying hip ABD 3x15  LT LE   Side lying  Clamshell 3x15    LT LE   Piriformis stretch x 5 hold 30 secs   Standing hip flexor stretch X 5 hold 10 Verbal cues and demonstration needed x 2   Blank cell = exercise not performed today   Discussed sleeping side lying with pillow between his legs to decrease LT hip and back  pain when RT side lying                                  03/07/23 EXERCISE LOG  Exercise Repetitions and Resistance Comments  Nustep  L3 x 16 minutes   LAQ 4# x 30 reps each   Standing HS curl  4# x 30 reps each    Marching on foam  30 reps each  BUE support  Standing gastroc stretch 2 minute    Standing hip ABD  25 reps each   Squatting  20 reps    Lateral step up  6" step x 20 reps each    Resisted pull down  Blue XTS x 25 reps     Blank cell = exercise not performed today   PATIENT EDUCATION:  Education details: Plan of care, prognosis, objective findings, and goals for therapy Person educated: Patient Education method: Explanation Education comprehension: verbalized understanding  HOME EXERCISE PROGRAM: [id number]  ASSESSMENT:  CLINICAL IMPRESSION: Patient is making good progress with skilled physical therapy as evidenced his subjective reports, objective measures, functional mobility, and progress toward his goal. He was able to meet his goal for reduced left hip and low back pain. However, he continues to exhibited reduced left hip muscular strength compared to the right hip. Today's treatment focused on familiar intervention for improved lower extremity strengthening. He was provided an updated HEP and he was able to properly demonstrate these interventions. He reported feeling comfortable completing these interventions at home. Recommend that he continue with skilled physical therapy for twice a week for two additional weeks to address his remaining impairments.    OBJECTIVE IMPAIRMENTS: decreased activity tolerance, decreased mobility, difficulty walking, decreased ROM, decreased strength, impaired tone,  and pain.   ACTIVITY LIMITATIONS: carrying, lifting, standing, squatting, and locomotion level  PARTICIPATION LIMITATIONS: meal prep, cleaning, laundry, shopping, community activity, and occupation  PERSONAL FACTORS: Past/current experiences, Time since onset of injury/illness/exacerbation, and 3+ comorbidities: Hypertension, diabetes, COPD, diabetes, and osteoarthritis  are also affecting patient's functional outcome.   REHAB POTENTIAL: Fair    CLINICAL DECISION MAKING: Evolving/moderate complexity  EVALUATION COMPLEXITY: Moderate   GOALS: Goals reviewed with patient? Yes  LONG TERM GOALS: Target date: 03/20/23  Patient will be independent with his HEP. Baseline:  Goal status: ON GOING  2.  Patient will be able to complete his daily activities without his familiar pain exceeding 5/10. Baseline: less than 5/10  Goal status: MET  3.  Patient will improve his hip flexor strength bilaterally to at least 4/5 for improved lower extremity strength. Baseline:  Goal status: PARTIALLY MET  4.  Patient will be able to complete his critical job demands without being limited by his familiar symptoms. Baseline:  Goal status: ON GOING  PLAN:  PT FREQUENCY: 2x/week  PT DURATION: 4 weeks  PLANNED INTERVENTIONS: Therapeutic exercises, Therapeutic activity, Neuromuscular re-education, Balance training, Patient/Family education, Self Care, Joint mobilization, Electrical stimulation, Spinal mobilization, Cryotherapy, Moist heat, Manual therapy, and Re-evaluation  PLAN FOR NEXT SESSION: NuStep, lumbar and lower extremity strengthening (bridging, straight leg raise, etc.), balance interventions, and modalities as needed   Granville Lewis, PT 03/20/2023, 3:46 PM

## 2023-03-26 ENCOUNTER — Ambulatory Visit: Payer: Medicare (Managed Care)

## 2023-03-26 DIAGNOSIS — M25552 Pain in left hip: Secondary | ICD-10-CM

## 2023-03-26 DIAGNOSIS — M5459 Other low back pain: Secondary | ICD-10-CM

## 2023-03-26 DIAGNOSIS — M6281 Muscle weakness (generalized): Secondary | ICD-10-CM

## 2023-03-26 DIAGNOSIS — R262 Difficulty in walking, not elsewhere classified: Secondary | ICD-10-CM

## 2023-03-26 NOTE — Therapy (Signed)
OUTPATIENT PHYSICAL THERAPY LOWER EXTREMITY TREATMENT   Patient Name: Jimmy Craig MRN: [id number] DOB:08-26-1950, 72 y.o., male Today's Date: 03/26/2023  END OF SESSION:  PT End of Session - 03/26/23 1347     Visit Number 9    Number of Visits 12    Date for PT Re-Evaluation 05/02/23    PT Start Time 1345    PT Stop Time 1430    PT Time Calculation (min) 45 min    Activity Tolerance Patient tolerated treatment well    Behavior During Therapy [facility] for tasks assessed/performed                   Past Medical History:  Diagnosis Date   Arthritis    BPH (benign prostatic hyperplasia)    Chronic back pain    GERD (gastroesophageal reflux disease)    Hypertension    Mixed hyperlipidemia    Type 2 diabetes mellitus (HCC)    Past Surgical History:  Procedure Laterality Date   CATARACT EXTRACTION Right    CATARACT EXTRACTION W/PHACO Left 12/20/2013   Procedure: CATARACT EXTRACTION PHACO AND INTRAOCULAR LENS PLACEMENT (IOC);  Surgeon: Susa Simmonds, MD;  Location: [facility];  Service: Ophthalmology;  Laterality: Left;  CDE: 0.97   Patient Active Problem List   Diagnosis Date Noted   Retinal hole or tear, right 04/25/2022   Vitreous hemorrhage, right eye (HCC) 04/25/2022   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) 04/25/2022   Chronic obstructive pulmonary disease (HCC) 10/22/2021   Lumbago with sciatica, left side 03/16/2021   Constipation 06/01/2020   Osteoarthritis 04/26/2019   Hypertension associated with diabetes (HCC) 09/29/2018   Diabetes (HCC) 08/14/2018   Hyperlipidemia associated with type 2 diabetes mellitus (HCC) 08/14/2018   GERD without esophagitis 08/14/2018   BPH (benign prostatic hyperplasia) 08/14/2018   Iron deficiency anemia 08/14/2018   Spinal stenosis of lumbar region 08/08/2017   Dizzy spells 01/20/2015    PCP: Junie Spencer, FNP  REFERRING PROVIDER: Erick Colace, MD   REFERRING DIAG:  Left hip pain   THERAPY DIAG:  Other low back pain  Pain in left hip  Muscle weakness (generalized)  Difficulty in walking, not elsewhere classified  Rationale for Evaluation and Treatment: Rehabilitation  ONSET DATE: 2 years ago   SUBJECTIVE:   SUBJECTIVE STATEMENT: Patient reports 4-5/10 left hip and low back pain  PERTINENT HISTORY: Hypertension, diabetes, COPD, diabetes, and osteoarthritis PAIN:  Are you having pain? Yes: NPRS scale: 4-5/10 Pain location: lleft hip and low back Pain description: intermittent sharp pain Aggravating factors: putting pressure on his left foot, walking, steps Relieving factors: sitting and laying down  PRECAUTIONS: None  RED FLAGS: None   WEIGHT BEARING RESTRICTIONS: No  FALLS:  Has patient fallen in last 6 months?  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  POSTURE: forward head and decreased lumbar lordosis  PALPATION:  TTP: L3-5 spinous processes, left hip flexors, TFL, and proximal quadriceps  LUMBAR ROM:   Active  A/PROM  eval  Flexion 50; familiar pain  Extension 10  Right lateral flexion   Left lateral flexion   Right rotation   Left rotation    (Blank rows = not tested)   LOWER EXTREMITY ROM:     Active  Right eval Left eval  Hip flexion 122; slight right hip pain  110; slight left hip pain   Hip  extension    Hip abduction    Hip adduction    Hip internal rotation  WFL (PROM)  Hip external rotation  Familiar pain with PROM   Knee flexion    Knee extension    Ankle dorsiflexion    Ankle plantarflexion    Ankle inversion    Ankle eversion     (Blank rows = not tested)   LOWER EXTREMITY MMT:  MMT Right eval Right 03/20/23 Left eval Left 03/20/23  Hip flexion 4-/5 4/5 4-/5 4-/5  Hip extension      Hip abduction      Hip adduction      Hip internal rotation      Hip external rotation      Knee flexion 4/5  4/5   Knee extension 5/5  5/5   Ankle dorsiflexion 4/5  4/5   Ankle plantarflexion      Ankle inversion      Ankle eversion       (Blank rows = not tested)  LOWER EXTREMITY SPECIAL TESTS:  Hip special tests: Luisa Hart (FABER) test: positive  and Hip scouring test: positive   GAIT: Assistive device utilized: None Level of assistance: Complete Independence  TODAY'S TREATMENT:                                                                                                                              DATE:                                     03/26/23 EXERCISE LOG  Exercise Repetitions and Resistance Comments  Nustep  L4 x 17 minutes   Rockerboard 3 mins   Forward Step Ups 6" box x 20 reps bil   LAQs 2# x 20 reps bil LLE only   Seated Marches 2# x 20 reps bil   Seated Hip Abduction Red x 3 mins   Seated Hip Adduction 3 mins   STS X 15 reps     Blank cell = exercise not performed today  PATIENT EDUCATION:  Education details: Plan of care, prognosis, objective findings, and goals for therapy Person educated: Patient Education method: Explanation Education comprehension: verbalized understanding  HOME EXERCISE PROGRAM: [id number]  ASSESSMENT:  CLINICAL IMPRESSION: Pt arrives for today's treatment session reporting 4-5/10 left low back and hip pain.  Pt reports that he pain usually increases after  15-20 mins of walking.  Pt instructed in standing and seated LE exercises today.  Pt requiring min cues for proper technique and posture.  Pt able to perform all STS reps without use of UE support. Pt reports increased fatigue at completion of today's treatment session, but denies any change in pain.   OBJECTIVE IMPAIRMENTS: decreased activity tolerance, decreased mobility, difficulty walking, decreased ROM, decreased strength, impaired tone, and pain.   ACTIVITY LIMITATIONS: carrying, lifting, standing, squatting, and locomotion level  PARTICIPATION LIMITATIONS: meal prep, cleaning, laundry, shopping, community activity, and occupation  PERSONAL FACTORS: Past/current experiences, Time since onset of injury/illness/exacerbation, and 3+ comorbidities: Hypertension, diabetes, COPD, diabetes, and osteoarthritis  are also affecting patient's functional outcome.   REHAB POTENTIAL: Fair    CLINICAL DECISION MAKING: Evolving/moderate complexity  EVALUATION COMPLEXITY: Moderate   GOALS: Goals reviewed with patient? Yes  LONG TERM GOALS: Target date: 03/20/23  Patient will be independent with his HEP. Baseline:  Goal status: ON GOING  2.  Patient will be able to complete his daily activities without his familiar pain exceeding 5/10. Baseline: less than 5/10  Goal status: MET  3.  Patient will improve his hip flexor strength bilaterally to at least 4/5 for improved lower extremity strength. Baseline:  Goal status: PARTIALLY MET  4.  Patient will be able to complete his critical job demands without being limited by his familiar symptoms. Baseline:  Goal status: ON GOING  PLAN:  PT FREQUENCY: 2x/week  PT DURATION: 4 weeks  PLANNED INTERVENTIONS: Therapeutic exercises, Therapeutic activity, Neuromuscular re-education, Balance training, Patient/Family education, Self Care, Joint mobilization, Electrical stimulation, Spinal mobilization, Cryotherapy, Moist heat, Manual therapy, and  Re-evaluation  PLAN FOR NEXT SESSION: NuStep, lumbar and lower extremity strengthening (bridging, straight leg raise, etc.), balance interventions, and modalities as needed   Newman Pies, PTA 03/26/2023, 2:30 PM

## 2023-03-27 ENCOUNTER — Ambulatory Visit: Payer: Medicare (Managed Care)

## 2023-03-27 DIAGNOSIS — M6281 Muscle weakness (generalized): Secondary | ICD-10-CM

## 2023-03-27 DIAGNOSIS — M25552 Pain in left hip: Secondary | ICD-10-CM

## 2023-03-27 DIAGNOSIS — M5459 Other low back pain: Secondary | ICD-10-CM | POA: Diagnosis not present

## 2023-03-27 DIAGNOSIS — R262 Difficulty in walking, not elsewhere classified: Secondary | ICD-10-CM

## 2023-03-27 NOTE — Therapy (Signed)
OUTPATIENT PHYSICAL THERAPY LOWER EXTREMITY TREATMENT   Patient Name: Jimmy Craig MRN: [id number] DOB:1951-01-05, 72 y.o., male Today's Date: 03/27/2023  END OF SESSION:  PT End of Session - 03/27/23 1652     Visit Number 10    Number of Visits 12    Date for PT Re-Evaluation 05/02/23    PT Start Time 1645    PT Stop Time 1730    PT Time Calculation (min) 45 min    Activity Tolerance Patient tolerated treatment well    Behavior During Therapy [facility] for tasks assessed/performed                   Past Medical History:  Diagnosis Date   Arthritis    BPH (benign prostatic hyperplasia)    Chronic back pain    GERD (gastroesophageal reflux disease)    Hypertension    Mixed hyperlipidemia    Type 2 diabetes mellitus (HCC)    Past Surgical History:  Procedure Laterality Date   CATARACT EXTRACTION Right    CATARACT EXTRACTION W/PHACO Left 12/20/2013   Procedure: CATARACT EXTRACTION PHACO AND INTRAOCULAR LENS PLACEMENT (IOC);  Surgeon: Susa Simmonds, MD;  Location: [facility];  Service: Ophthalmology;  Laterality: Left;  CDE: 0.97   Patient Active Problem List   Diagnosis Date Noted   Retinal hole or tear, right 04/25/2022   Vitreous hemorrhage, right eye (HCC) 04/25/2022   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) 04/25/2022   Chronic obstructive pulmonary disease (HCC) 10/22/2021   Lumbago with sciatica, left side 03/16/2021   Constipation 06/01/2020   Osteoarthritis 04/26/2019   Hypertension associated with diabetes (HCC) 09/29/2018   Diabetes (HCC) 08/14/2018   Hyperlipidemia associated with type 2 diabetes mellitus (HCC) 08/14/2018   GERD without esophagitis 08/14/2018   BPH (benign prostatic hyperplasia) 08/14/2018   Iron deficiency anemia 08/14/2018   Spinal stenosis of lumbar region 08/08/2017   Dizzy spells 01/20/2015    PCP: Junie Spencer, FNP  REFERRING PROVIDER: Erick Colace, MD   REFERRING  DIAG: Left hip pain   THERAPY DIAG:  Other low back pain  Pain in left hip  Muscle weakness (generalized)  Difficulty in walking, not elsewhere classified  Rationale for Evaluation and Treatment: Rehabilitation  ONSET DATE: 2 years ago   SUBJECTIVE:   SUBJECTIVE STATEMENT: Patient reports 4-5/10 left hip pain.  PERTINENT HISTORY: Hypertension, diabetes, COPD, diabetes, and osteoarthritis PAIN:  Are you having pain? Yes: NPRS scale: 4-5/10 Pain location: left hip  Pain description: intermittent sharp pain Aggravating factors: putting pressure on his left foot, walking, steps Relieving factors: sitting and laying down  PRECAUTIONS: None  RED FLAGS: None   WEIGHT BEARING RESTRICTIONS: No  FALLS:  Has patient fallen in last 6 months?  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  POSTURE: forward head and decreased lumbar lordosis  PALPATION: TTP: L3-5 spinous processes, left  hip flexors, TFL, and proximal quadriceps  LUMBAR ROM:   Active  A/PROM  eval  Flexion 50; familiar pain  Extension 10  Right lateral flexion   Left lateral flexion   Right rotation   Left rotation    (Blank rows = not tested)   LOWER EXTREMITY ROM:     Active  Right eval Left eval  Hip flexion 122; slight right hip pain  110; slight left hip pain   Hip extension    Hip abduction     Hip adduction    Hip internal rotation  WFL (PROM)  Hip external rotation  Familiar pain with PROM   Knee flexion    Knee extension    Ankle dorsiflexion    Ankle plantarflexion    Ankle inversion    Ankle eversion     (Blank rows = not tested)   LOWER EXTREMITY MMT:  MMT Right eval Right 03/20/23 Left eval Left 03/20/23  Hip flexion 4-/5 4/5 4-/5 4-/5  Hip extension      Hip abduction      Hip adduction      Hip internal rotation      Hip external rotation      Knee flexion 4/5  4/5   Knee extension 5/5  5/5   Ankle dorsiflexion 4/5  4/5   Ankle plantarflexion      Ankle inversion      Ankle eversion       (Blank rows = not tested)  LOWER EXTREMITY SPECIAL TESTS:  Hip special tests: Luisa Hart (FABER) test: positive  and Hip scouring test: positive   GAIT: Assistive device utilized: None Level of assistance: Complete Independence  TODAY'S TREATMENT:                                                                                                                              DATE:                                     03/27/23 EXERCISE LOG  Exercise Repetitions and Resistance Comments  Nustep  L4 x 17 minutes   Rockerboard 4 mins   Forward Step Ups 6" box x 25 reps bil   Side-stepping Airex x 3 mins LLE only        Blank cell = exercise not performed today                               Manual Therapy Soft Tissue Mobilization: right quad and IT band, IASTW/M to right quad to decrease pain and tone with pt in supine for comfort with wedge under BLEs                                 PATIENT  EDUCATION:  Education details: Plan of care, prognosis, objective findings, and goals for therapy Person educated: Patient Education method: Explanation Education comprehension: verbalized understanding  HOME EXERCISE PROGRAM: [id number]  ASSESSMENT:  CLINICAL IMPRESSION: Pt arrives for today's treatment session reporting 4-5/10 left hip pain.  Pt able to tolerate increased  reps with forward step ups today requiring min cues to avoid pulling with BUEs.  IASTW/M performed to left quad and IT band to decrease pain and tone with good results.  Pt given tennis ball for performance of self STW.  Pt reported 3/10 left hip pain at completion of today's treatment session.   OBJECTIVE IMPAIRMENTS: decreased activity tolerance, decreased mobility, difficulty walking, decreased ROM, decreased strength, impaired tone, and pain.   ACTIVITY LIMITATIONS: carrying, lifting, standing, squatting, and locomotion level  PARTICIPATION LIMITATIONS: meal prep, cleaning, laundry, shopping, community activity, and occupation  PERSONAL FACTORS: Past/current experiences, Time since onset of injury/illness/exacerbation, and 3+ comorbidities: Hypertension, diabetes, COPD, diabetes, and osteoarthritis  are also affecting patient's functional outcome.   REHAB POTENTIAL: Fair    CLINICAL DECISION MAKING: Evolving/moderate complexity  EVALUATION COMPLEXITY: Moderate   GOALS: Goals reviewed with patient? Yes  LONG TERM GOALS: Target date: 03/20/23  Patient will be independent with his HEP. Baseline:  Goal status: ON GOING  2.  Patient will be able to complete his daily activities without his familiar pain exceeding 5/10. Baseline: less than 5/10  Goal status: MET  3.  Patient will improve his hip flexor strength bilaterally to at least 4/5 for improved lower extremity strength. Baseline:  Goal status: PARTIALLY MET  4.  Patient will be able to complete his critical job demands without being limited by his familiar symptoms. Baseline:  Goal status: ON GOING  PLAN:  PT FREQUENCY: 2x/week  PT DURATION: 4 weeks  PLANNED INTERVENTIONS: Therapeutic exercises, Therapeutic activity, Neuromuscular re-education, Balance training, Patient/Family education, Self Care, Joint mobilization, Electrical stimulation, Spinal mobilization, Cryotherapy, Moist heat, Manual therapy, and  Re-evaluation  PLAN FOR NEXT SESSION: NuStep, lumbar and lower extremity strengthening (bridging, straight leg raise, etc.), balance interventions, and modalities as needed   Newman Pies, PTA 03/27/2023, 5:40 PM

## 2023-03-28 ENCOUNTER — Telehealth: Payer: Self-pay

## 2023-03-28 ENCOUNTER — Encounter
Payer: Medicare (Managed Care) | Attending: Physical Medicine & Rehabilitation | Admitting: Physical Medicine & Rehabilitation

## 2023-03-28 ENCOUNTER — Encounter: Payer: Self-pay | Admitting: Physical Medicine & Rehabilitation

## 2023-03-28 VITALS — BP 125/75 | HR 76 | Ht 65.0 in | Wt 140.0 lb

## 2023-03-28 DIAGNOSIS — M48062 Spinal stenosis, lumbar region with neurogenic claudication: Secondary | ICD-10-CM | POA: Insufficient documentation

## 2023-03-28 MED ORDER — PREGABALIN 150 MG PO CAPS: 150.0000 mg | ORAL_CAPSULE | Freq: Three times a day (TID) | ORAL | 1 refills | Status: DC

## 2023-03-28 NOTE — Telephone Encounter (Signed)
Request for Medication Hold/ faxed to [facility] A. Hawks FNP.  Fax sent on 03/28/2023.  Will it be okay to hold Plavix  for 7 days for a Spinal injection?

## 2023-03-28 NOTE — Progress Notes (Signed)
Subjective:    Patient ID: Jimmy Craig, male    DOB: 04/25/1951, 72 y.o.   MRN: [id number]  HPI  Feels PT is helping although he now feels like his hip pain is better however his pain below the knees has increased Left hip pain is doing better  Now has L>R sided leg pain and cramping  Walks a lot at work but not at home  No falls  Pain Inventory Average Pain 5 Pain Right Now 5 My pain is intermittent, burning, and tingling  In the last 24 hours, has pain interfered with the following? General activity 5 Relation with others 0 Enjoyment of life 0 What TIME of day is your pain at its worst? varies Sleep (in general) Good  Pain is worse with: walking, standing, and some activites Pain improves with: rest and therapy/exercise Relief from Meds:  Patient left this blank  Family History  Problem Relation Age of Onset   Diabetes Mother    Social History   Socioeconomic History   Marital status: Married    Spouse name: Not on file   Number of children: Not on file   Years of education: Not on file   Highest education level: Master's degree (e.g., MA, MS, MEng, MEd, MSW, MBA)  Occupational History   Not on file  Tobacco Use   Smoking status: Never   Smokeless tobacco: Never  Vaping Use   Vaping status: Never Used  Substance and Sexual Activity   Alcohol use: No   Drug use: No   Sexual activity: Yes    Birth control/protection: None  Other Topics Concern   Not on file  Social History Narrative   Not on file   Social Determinants of Health   Financial Resource Strain: Medium Risk (12/03/2022)   Overall Financial Resource Strain (CARDIA)    Difficulty of Paying Living Expenses: Somewhat hard  Food Insecurity: No Food Insecurity (12/03/2022)   Hunger Vital Sign    Worried About Running Out of Food in the Last Year: Never true    Ran Out of Food in the Last Year: Never true  Transportation Needs: No Transportation Needs (12/03/2022)   PRAPARE - Therapist, art (Medical): No    Lack of Transportation (Non-Medical): No  Physical Activity: Insufficiently Active (12/03/2022)   Exercise Vital Sign    Days of Exercise per Week: 3 days    Minutes of Exercise per Session: 10 min  Stress: Stress Concern Present (12/03/2022)   [employer] of Occupational Health - Occupational Stress Questionnaire    Feeling of Stress : To some extent  Social Connections: Moderately Integrated (12/03/2022)   Social Connection and Isolation Panel [NHANES]    Frequency of Communication with Friends and Family: Three times a week    Frequency of Social Gatherings with Friends and Family: Three times a week    Attends Religious Services: 1 to 4 times per year    Active Member of Clubs or Organizations: No    Attends Engineer, structural: Not on file    Marital Status: Married   Past Surgical History:  Procedure Laterality Date   CATARACT EXTRACTION Right    CATARACT EXTRACTION W/PHACO Left 12/20/2013   Procedure: CATARACT EXTRACTION PHACO AND INTRAOCULAR LENS PLACEMENT (IOC);  Surgeon: Susa Simmonds, MD;  Location: [facility];  Service: Ophthalmology;  Laterality: Left;  CDE: 0.97   Past Surgical History:  Procedure Laterality Date   CATARACT EXTRACTION Right  CATARACT EXTRACTION W/PHACO Left 12/20/2013   Procedure: CATARACT EXTRACTION PHACO AND INTRAOCULAR LENS PLACEMENT (IOC);  Surgeon: Susa Simmonds, MD;  Location: [facility];  Service: Ophthalmology;  Laterality: Left;  CDE: 0.97   Past Medical History:  Diagnosis Date   Arthritis    BPH (benign prostatic hyperplasia)    Chronic back pain    GERD (gastroesophageal reflux disease)    Hypertension    Mixed hyperlipidemia    Type 2 diabetes mellitus (HCC)    BP 125/75   Pulse 76   Ht 5\' 5"  (1.651 m)   Wt 140 lb (63.5 kg)   SpO2 95%   BMI 23.30 kg/m   Opioid Risk Score:   Fall Risk Score:  `1  Depression screen PHQ 2/9     03/07/2023    1:26 PM 12/20/2022    2:30 PM  08/13/2022    1:41 PM 05/13/2022    3:23 PM 01/29/2022   11:05 AM 10/22/2021    1:49 PM 04/23/2021    2:02 PM  Depression screen PHQ 2/9  Decreased Interest 0 1 0 0 0 0 0  Down, Depressed, Hopeless 0 0 0 0 0 0 0  PHQ - 2 Score 0 1 0 0 0 0 0  Altered sleeping 0 1 0  0  0  Tired, decreased energy 1 2 0  2  1  Change in appetite 0 0 0  0  0  Feeling bad or failure about yourself  0 0 0  0  0  Trouble concentrating 1 1 0  1    Moving slowly or fidgety/restless 0 2 0  0  0  Suicidal thoughts 0 0 0  0  0  PHQ-9 Score 2 7 0  3  1  Difficult doing work/chores Somewhat difficult Somewhat difficult Not difficult at all  Somewhat difficult  Not difficult at all    Review of Systems  Musculoskeletal:        Left knee pain down to left ankle. Left hip pain down to left thigh  All other systems reviewed and are negative.      Objective:   Physical Exam   Gen NAD Mood/Affect appropriate' Lumbar spine range of motion is 50% flexion extension lateral bending and rotation he has pain with extension greater than with flexion of lumbar spine Negative straight leg raise bilaterally Motor strength is 5/5 bilateral hip flexor knee extensor ankle dorsiflexor Ambulates without assistive device no evidence of toe drag or knee instability     Assessment & Plan:   1.  Lumbar postlaminectomy syndrome with chronic lower extremity paresthesias will cont Lyrica to 150 mg 3 times daily and monitor for drowsiness.  He does have foraminal stenosis on the left side graded as moderate at L2-3 that may be a pain generator for his symptoms.  His lower extremity discomfort now appears to be more bilateral ankle and leg.  He is failed Lyrica as well as physical therapy.  Given the bilateral nature of his symptoms will schedule for caudal epidural steroid injection. 2.  Left hip/groin pain, pain with range of motion on examination.  The patient has labral tears mild as well as mild OA which may be causing his issues.  If  he does not respond to physical therapy may consider intra-articular hip injection under fluoroscopy guidance   Continue tramadol this was last prescribed by his primary physician I will not prescribe it UDS was appropriate he complains of reduced mobility does  have some hip joint restriction continue physical therapy.  Return to clinic 4 to 6 weeks

## 2023-03-28 NOTE — Patient Instructions (Signed)
Will need driver for injection

## 2023-04-01 ENCOUNTER — Ambulatory Visit: Payer: Medicare (Managed Care) | Admitting: Physical Therapy

## 2023-04-01 ENCOUNTER — Encounter: Payer: Self-pay | Admitting: Physical Therapy

## 2023-04-01 DIAGNOSIS — M5459 Other low back pain: Secondary | ICD-10-CM | POA: Diagnosis not present

## 2023-04-01 DIAGNOSIS — M25552 Pain in left hip: Secondary | ICD-10-CM

## 2023-04-01 DIAGNOSIS — M6281 Muscle weakness (generalized): Secondary | ICD-10-CM

## 2023-04-01 NOTE — Therapy (Addendum)
OUTPATIENT PHYSICAL THERAPY LOWER EXTREMITY TREATMENT   Patient Name: Jimmy Craig MRN: [id number] DOB:08-Jun-1951, 72 y.o., male Today's Date: 04/01/2023  END OF SESSION:  PT End of Session - 04/01/23 1344     Visit Number 11    Number of Visits 12    Date for PT Re-Evaluation 05/02/23    PT Start Time 1349    PT Stop Time 1428    PT Time Calculation (min) 39 min    Activity Tolerance Patient tolerated treatment well    Behavior During Therapy WFL for tasks assessed/performed            Past Medical History:  Diagnosis Date   Arthritis    BPH (benign prostatic hyperplasia)    Chronic back pain    GERD (gastroesophageal reflux disease)    Hypertension    Mixed hyperlipidemia    Type 2 diabetes mellitus (HCC)    Past Surgical History:  Procedure Laterality Date   CATARACT EXTRACTION Right    CATARACT EXTRACTION W/PHACO Left 12/20/2013   Procedure: CATARACT EXTRACTION PHACO AND INTRAOCULAR LENS PLACEMENT (IOC);  Surgeon: Susa Simmonds, MD;  Location: [facility];  Service: Ophthalmology;  Laterality: Left;  CDE: 0.97   Patient Active Problem List   Diagnosis Date Noted   Retinal hole or tear, right 04/25/2022   Vitreous hemorrhage, right eye (HCC) 04/25/2022   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) 04/25/2022   Chronic obstructive pulmonary disease (HCC) 10/22/2021   Lumbago with sciatica, left side 03/16/2021   Constipation 06/01/2020   Osteoarthritis 04/26/2019   Hypertension associated with diabetes (HCC) 09/29/2018   Diabetes (HCC) 08/14/2018   Hyperlipidemia associated with type 2 diabetes mellitus (HCC) 08/14/2018   GERD without esophagitis 08/14/2018   BPH (benign prostatic hyperplasia) 08/14/2018   Iron deficiency anemia 08/14/2018   Spinal stenosis of lumbar region 08/08/2017   Dizzy spells 01/20/2015   PCP: Junie Spencer, FNP  REFERRING PROVIDER: Erick Colace, MD   REFERRING DIAG: Left hip pain    THERAPY DIAG:  Other low back pain  Pain in left hip  Muscle weakness (generalized)  Rationale for Evaluation and Treatment: Rehabilitation  ONSET DATE: 2 years ago   SUBJECTIVE:   SUBJECTIVE STATEMENT: More pain directly in L hip. Does go to work but after being up for a while, his pain returns.   PERTINENT HISTORY: Hypertension, diabetes, COPD, diabetes, and osteoarthritis  PAIN:  Are you having pain? Yes: NPRS scale: 5-6/10 Pain location: left hip  Pain description: intermittent sharp pain Aggravating factors: putting pressure on his left foot, walking, steps Relieving factors: sitting and laying down  PRECAUTIONS: None  RED FLAGS: None   WEIGHT BEARING RESTRICTIONS: No  FALLS:  Has patient fallen in last 6 months?  No, but has had a near fall when his pain is very bad  PATIENT GOALS: reduced pain and be able to be on his feet more to garden  NEXT MD VISIT: 03/28/23  OBJECTIVE: all objective assessments were assessed at his initial evaluation on 02/20/23 unless otherwise noted COGNITION: Overall cognitive status: Within functional limits for tasks assessed     SENSATION: Patient reports intermittent numbness in his toes after work.  POSTURE: forward head and decreased lumbar lordosis  PALPATION: TTP: L3-5 spinous processes, left hip flexors, TFL, and proximal quadriceps  LUMBAR ROM:   Active  A/PROM  eval  Flexion 50; familiar pain  Extension 10  Right lateral flexion   Left lateral  flexion   Right rotation   Left rotation    (Blank rows = not tested)   LOWER EXTREMITY ROM:     Active  Right eval Left eval  Hip flexion 122; slight right hip pain  110; slight left hip pain   Hip extension    Hip abduction    Hip adduction    Hip internal rotation  WFL (PROM)  Hip external rotation  Familiar pain with PROM   Knee flexion    Knee extension    Ankle dorsiflexion    Ankle plantarflexion    Ankle inversion    Ankle eversion     (Blank  rows = not tested)   LOWER EXTREMITY MMT:  MMT Right eval Right 03/20/23 Left eval Left 03/20/23  Hip flexion 4-/5 4/5 4-/5 4-/5  Hip extension      Hip abduction      Hip adduction      Hip internal rotation      Hip external rotation      Knee flexion 4/5  4/5   Knee extension 5/5  5/5   Ankle dorsiflexion 4/5  4/5   Ankle plantarflexion      Ankle inversion      Ankle eversion       (Blank rows = not tested)  LOWER EXTREMITY SPECIAL TESTS:  Hip special tests: Luisa Hart (FABER) test: positive  and Hip scouring test: positive   GAIT: Assistive device utilized: None Level of assistance: Complete Independence  TODAY'S TREATMENT:                                                                                                                              DATE:  04/01/23 EXERCISE LOG  Exercise Repetitions and Resistance Comments  Nustep L2 x17 min   Hip flexion X20 reps BLE   Hip abduction X20 reps BLE   HS curl with extension X20 reps BLE   Ball squeeze X2 min supine  Supine clam X2 min; Red theraband   Bridge X20 reps 3 sec holds Min discomfort in L hip  SLR X20 reps   SL clam BLE x20 reps   L figure 4 stretch 4 x20 sec   LTR X10 reps 5 sec holds    Blank cell = exercise not performed today                                    03/27/23 EXERCISE LOG  Exercise Repetitions and Resistance Comments  Nustep  L4 x 17 minutes   Rockerboard 4 mins   Forward Step Ups 6" box x 25 reps bil   Side-stepping Airex x 3 mins LLE only        Blank cell = exercise not performed today  PATIENT EDUCATION:  Education details: Plan of care, prognosis, objective findings, and goals for therapy Person educated: Patient Education method: Explanation Education comprehension: verbalized understanding  HOME EXERCISE PROGRAM: [id number]  ASSESSMENT:  CLINICAL IMPRESSION: Patient presented in clinic with a mild increase in L hip since last  visit. Patient states that he has returned to work and does a lot of sitting which can cause stiffness and if he has to walk will increase pain. Patient progress through  various lumbar/hip stretching and strengthening with mild increase in pain intermittently especially with LLE WB and bridge. Patient states that he can lay in SL but L hip can be tender. Patient had no complaints after session.  OBJECTIVE IMPAIRMENTS: decreased activity tolerance, decreased mobility, difficulty walking, decreased ROM, decreased strength, impaired tone, and pain.   ACTIVITY LIMITATIONS: carrying, lifting, standing, squatting, and locomotion level  PARTICIPATION LIMITATIONS: meal prep, cleaning, laundry, shopping, community activity, and occupation  PERSONAL FACTORS: Past/current experiences, Time since onset of injury/illness/exacerbation, and 3+ comorbidities: Hypertension, diabetes, COPD, diabetes, and osteoarthritis  are also affecting patient's functional outcome.   REHAB POTENTIAL: Fair    CLINICAL DECISION MAKING: Evolving/moderate complexity  EVALUATION COMPLEXITY: Moderate  GOALS: Goals reviewed with patient? Yes  LONG TERM GOALS: Target date: 03/20/23  Patient will be independent with his HEP. Baseline:  Goal status: MET  2.  Patient will be able to complete his daily activities without his familiar pain exceeding 5/10. Baseline: less than 5/10  Goal status: MET  3.  Patient will improve his hip flexor strength bilaterally to at least 4/5 for improved lower extremity strength. Baseline:  Goal status: PARTIALLY MET  4.  Patient will be able to complete his critical job demands without being limited by his familiar symptoms. Baseline:  Goal status: ON GOING  PLAN:  PT FREQUENCY: 2x/week  PT DURATION: 4 weeks  PLANNED INTERVENTIONS: Therapeutic exercises, Therapeutic activity, Neuromuscular re-education, Balance training, Patient/Family education, Self Care, Joint mobilization,  Electrical stimulation, Spinal mobilization, Cryotherapy, Moist heat, Manual therapy, and Re-evaluation  PLAN FOR NEXT SESSION: NuStep, lumbar and lower extremity strengthening (bridging, straight leg raise, etc.), balance interventions, and modalities as needed  Marvell Fuller, PTA 04/01/2023, 3:03 PM

## 2023-04-03 ENCOUNTER — Other Ambulatory Visit: Payer: Self-pay | Admitting: Family

## 2023-04-03 ENCOUNTER — Ambulatory Visit: Payer: Medicare (Managed Care)

## 2023-04-03 DIAGNOSIS — M25552 Pain in left hip: Secondary | ICD-10-CM

## 2023-04-03 DIAGNOSIS — M5459 Other low back pain: Secondary | ICD-10-CM

## 2023-04-03 DIAGNOSIS — M6281 Muscle weakness (generalized): Secondary | ICD-10-CM

## 2023-04-03 NOTE — Therapy (Signed)
OUTPATIENT PHYSICAL THERAPY LOWER EXTREMITY TREATMENT   Patient Name: Jimmy Craig MRN: [id number] DOB:09-01-1950, 72 y.o., male Today's Date: 04/03/2023  END OF SESSION:  PT End of Session - 04/03/23 1347     Visit Number 12    Number of Visits 16    Date for PT Re-Evaluation 05/02/23    PT Start Time 1345    PT Stop Time 1429    PT Time Calculation (min) 44 min    Activity Tolerance Patient tolerated treatment well    Behavior During Therapy [facility] for tasks assessed/performed             Past Medical History:  Diagnosis Date   Arthritis    BPH (benign prostatic hyperplasia)    Chronic back pain    GERD (gastroesophageal reflux disease)    Hypertension    Mixed hyperlipidemia    Type 2 diabetes mellitus (HCC)    Past Surgical History:  Procedure Laterality Date   CATARACT EXTRACTION Right    CATARACT EXTRACTION W/PHACO Left 12/20/2013   Procedure: CATARACT EXTRACTION PHACO AND INTRAOCULAR LENS PLACEMENT (IOC);  Surgeon: Susa Simmonds, MD;  Location: [facility];  Service: Ophthalmology;  Laterality: Left;  CDE: 0.97   Patient Active Problem List   Diagnosis Date Noted   Retinal hole or tear, right 04/25/2022   Vitreous hemorrhage, right eye (HCC) 04/25/2022   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) 04/25/2022   Chronic obstructive pulmonary disease (HCC) 10/22/2021   Lumbago with sciatica, left side 03/16/2021   Constipation 06/01/2020   Osteoarthritis 04/26/2019   Hypertension associated with diabetes (HCC) 09/29/2018   Diabetes (HCC) 08/14/2018   Hyperlipidemia associated with type 2 diabetes mellitus (HCC) 08/14/2018   GERD without esophagitis 08/14/2018   BPH (benign prostatic hyperplasia) 08/14/2018   Iron deficiency anemia 08/14/2018   Spinal stenosis of lumbar region 08/08/2017   Dizzy spells 01/20/2015   PCP: Junie Spencer, FNP  REFERRING PROVIDER: Erick Colace, MD   REFERRING DIAG: Left hip  pain   THERAPY DIAG:  Other low back pain  Pain in left hip  Muscle weakness (generalized)  Rationale for Evaluation and Treatment: Rehabilitation  ONSET DATE: 2 years ago   SUBJECTIVE:   SUBJECTIVE STATEMENT: Patient reports that his hips are tight today and it is hard for him to stand upright. He feels that he is about 75-80% better since starting therapy.   PERTINENT HISTORY: Hypertension, diabetes, COPD, diabetes, and osteoarthritis  PAIN:  Are you having pain? Yes: NPRS scale: 5-6/10 Pain location: left hip  Pain description: intermittent sharp pain Aggravating factors: putting pressure on his left foot, walking, steps Relieving factors: sitting and laying down  PRECAUTIONS: None  RED FLAGS: None   WEIGHT BEARING RESTRICTIONS: No  FALLS:  Has patient fallen in last 6 months?  No, but has had a near fall when his pain is very bad  PATIENT GOALS: reduced pain and be able to be on his feet more to garden  NEXT MD VISIT: 03/28/23  OBJECTIVE: all objective assessments were assessed at his initial evaluation on 02/20/23 unless otherwise noted COGNITION: Overall cognitive status: Within functional limits for tasks assessed     SENSATION: Patient reports intermittent numbness in his toes after work.  POSTURE: forward head and decreased lumbar lordosis  PALPATION: TTP: L3-5 spinous processes, left hip flexors, TFL, and proximal quadriceps  LUMBAR ROM:   Active  A/PROM  eval AROM 04/03/23  Flexion 50; familiar pain  56  Extension 10 14; familiar thigh pain   Right lateral flexion    Left lateral flexion    Right rotation    Left rotation     (Blank rows = not tested)   LOWER EXTREMITY ROM:     Active  Right eval Left eval  Hip flexion 122; slight right hip pain  110; slight left hip pain   Hip extension    Hip abduction    Hip adduction    Hip internal rotation  WFL (PROM)  Hip external rotation  Familiar pain with PROM   Knee flexion    Knee  extension    Ankle dorsiflexion    Ankle plantarflexion    Ankle inversion    Ankle eversion     (Blank rows = not tested)   LOWER EXTREMITY MMT:  MMT Right eval Right 03/20/23 Right 04/03/23 Left eval Left 03/20/23 Left 04/03/23  Hip flexion 4-/5 4/5 4/5 4-/5 4-/5 4-/5  Hip extension        Hip abduction        Hip adduction        Hip internal rotation        Hip external rotation        Knee flexion 4/5   4/5    Knee extension 5/5   5/5    Ankle dorsiflexion 4/5   4/5    Ankle plantarflexion        Ankle inversion        Ankle eversion         (Blank rows = not tested)  LOWER EXTREMITY SPECIAL TESTS:  Hip special tests: Luisa Hart (FABER) test: positive  and Hip scouring test: positive   GAIT: Assistive device utilized: None Level of assistance: Complete Independence  TODAY'S TREATMENT:                                                                                                                              DATE:                                   04/03/23 EXERCISE LOG  Exercise Repetitions and Resistance Comments  Nustep  L4 x 18 minutes    Standing gastroc stretch  2.5 minutes   Thomas stretch  2.5 minutes each   SLR  20 reps each   Standing HS curl 25 reps each     Blank cell = exercise not performed today     04/01/23 EXERCISE LOG  Exercise Repetitions and Resistance Comments  Nustep L2 x17 min   Hip flexion X20 reps BLE   Hip abduction X20 reps BLE   HS curl with extension X20 reps BLE   Ball squeeze X2 min supine  Supine clam X2 min; Red theraband   Bridge X20 reps 3 sec holds Min discomfort in L hip  SLR X20 reps  SL clam BLE x20 reps   L figure 4 stretch 4 x20 sec   LTR X10 reps 5 sec holds    Blank cell = exercise not performed today                                    03/27/23 EXERCISE LOG  Exercise Repetitions and Resistance Comments  Nustep  L4 x 17 minutes   Rockerboard 4 mins   Forward Step Ups 6" box x 25 reps bil   Side-stepping Airex x 3  mins LLE only        Blank cell = exercise not performed today                                                        PATIENT EDUCATION:  Education details: Plan of care, prognosis, objective findings, and goals for therapy Person educated: Patient Education method: Explanation Education comprehension: verbalized understanding  HOME EXERCISE PROGRAM: [email or web address]  ASSESSMENT:  CLINICAL IMPRESSION: Patient is making good progress with skilled physical therapy as evidenced by his subjective reports, functional mobility, and progress toward his goals. He feels that he 75-80% better since beginning therapy. However, he continues to experience intermittent pain with functional activities such as walking. Treatment focused on familiar interventions for improved hip mobility. He reported feeling good upon the conclusion of treatment. Recommend that he continue with skilled physical therapy for 4 additional visits at once per week to address his remaining impairments to maximize his functional mobility.   OBJECTIVE IMPAIRMENTS: decreased activity tolerance, decreased mobility, difficulty walking, decreased ROM, decreased strength, impaired tone, and pain.   ACTIVITY LIMITATIONS: carrying, lifting, standing, squatting, and locomotion level  PARTICIPATION LIMITATIONS: meal prep, cleaning, laundry, shopping, community activity, and occupation  PERSONAL FACTORS: Past/current experiences, Time since onset of injury/illness/exacerbation, and 3+ comorbidities: Hypertension, diabetes, COPD, diabetes, and osteoarthritis  are also affecting patient's functional outcome.   REHAB POTENTIAL: Fair    CLINICAL DECISION MAKING: Evolving/moderate complexity  EVALUATION COMPLEXITY: Moderate  GOALS: Goals reviewed with patient? Yes  LONG TERM GOALS: Target date: 03/20/23  Patient will be independent with his HEP. Baseline:  Goal status: MET  2.  Patient will be able to complete his daily activities without  his familiar pain exceeding 5/10. Baseline: less than 5/10  Goal status: MET  3.  Patient will improve his hip flexor strength bilaterally to at least 4/5 for improved lower extremity strength. Baseline:  Goal status: PARTIALLY MET  4.  Patient will be able to complete his critical job demands without being limited by his familiar symptoms. Baseline:  Goal status: ON GOING  PLAN:  PT FREQUENCY: 1x/week  PT DURATION: 4 weeks  PLANNED INTERVENTIONS: Therapeutic exercises, Therapeutic activity, Neuromuscular re-education, Balance training, Patient/Family education, Self Care, Joint mobilization, Electrical stimulation, Spinal mobilization, Cryotherapy, Moist heat, Manual therapy, and Re-evaluation  PLAN FOR NEXT SESSION: NuStep, lumbar and lower extremity strengthening (bridging, straight leg raise, etc.), balance interventions, and modalities as needed  Granville Lewis, PT 04/03/2023, 3:09 PM

## 2023-04-04 NOTE — Telephone Encounter (Signed)
Request to hold form has been faxed back with the approval to hold Plavix 75 MG for 7 days. Hold 7 day before the 05/13/2023 Epidural injection. Patient has been notified to do so. He understood.

## 2023-04-07 ENCOUNTER — Other Ambulatory Visit: Payer: Self-pay | Admitting: Family

## 2023-04-07 DIAGNOSIS — M25559 Pain in unspecified hip: Secondary | ICD-10-CM

## 2023-04-07 DIAGNOSIS — M159 Polyosteoarthritis, unspecified: Secondary | ICD-10-CM

## 2023-04-08 ENCOUNTER — Ambulatory Visit: Payer: Medicare (Managed Care) | Attending: Physical Medicine & Rehabilitation

## 2023-04-08 DIAGNOSIS — M6281 Muscle weakness (generalized): Secondary | ICD-10-CM | POA: Diagnosis present

## 2023-04-08 DIAGNOSIS — R262 Difficulty in walking, not elsewhere classified: Secondary | ICD-10-CM | POA: Insufficient documentation

## 2023-04-08 DIAGNOSIS — M25552 Pain in left hip: Secondary | ICD-10-CM | POA: Insufficient documentation

## 2023-04-08 DIAGNOSIS — M5459 Other low back pain: Secondary | ICD-10-CM | POA: Insufficient documentation

## 2023-04-08 NOTE — Therapy (Signed)
OUTPATIENT PHYSICAL THERAPY LOWER EXTREMITY TREATMENT   Patient Name: Jimmy Craig MRN: [id number] DOB:1951-02-12, 72 y.o., male Today's Date: 04/08/2023  END OF SESSION:  PT End of Session - 04/08/23 1435     Visit Number 13    Number of Visits 16    Date for PT Re-Evaluation 05/02/23    PT Start Time 1430    PT Stop Time 1515    PT Time Calculation (min) 45 min    Activity Tolerance Patient tolerated treatment well    Behavior During Therapy WFL for tasks assessed/performed             Past Medical History:  Diagnosis Date   Arthritis    BPH (benign prostatic hyperplasia)    Chronic back pain    GERD (gastroesophageal reflux disease)    Hypertension    Mixed hyperlipidemia    Type 2 diabetes mellitus (HCC)    Past Surgical History:  Procedure Laterality Date   CATARACT EXTRACTION Right    CATARACT EXTRACTION W/PHACO Left 12/20/2013   Procedure: CATARACT EXTRACTION PHACO AND INTRAOCULAR LENS PLACEMENT (IOC);  Surgeon: Susa Simmonds, MD;  Location: [facility];  Service: Ophthalmology;  Laterality: Left;  CDE: 0.97   Patient Active Problem List   Diagnosis Date Noted   Retinal hole or tear, right 04/25/2022   Vitreous hemorrhage, right eye (HCC) 04/25/2022   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) 04/25/2022   Chronic obstructive pulmonary disease (HCC) 10/22/2021   Lumbago with sciatica, left side 03/16/2021   Constipation 06/01/2020   Osteoarthritis 04/26/2019   Hypertension associated with diabetes (HCC) 09/29/2018   Diabetes (HCC) 08/14/2018   Hyperlipidemia associated with type 2 diabetes mellitus (HCC) 08/14/2018   GERD without esophagitis 08/14/2018   BPH (benign prostatic hyperplasia) 08/14/2018   Iron deficiency anemia 08/14/2018   Spinal stenosis of lumbar region 08/08/2017   Dizzy spells 01/20/2015   PCP: Junie Spencer, FNP  REFERRING PROVIDER: Erick Colace, MD   REFERRING DIAG: Left hip pain    THERAPY DIAG:  Other low back pain  Pain in left hip  Muscle weakness (generalized)  Difficulty in walking, not elsewhere classified  Rationale for Evaluation and Treatment: Rehabilitation  ONSET DATE: 2 years ago   SUBJECTIVE:   SUBJECTIVE STATEMENT: Patient reports 5/10 left hip pain today.  Pt reports that pain is more focused at the joint today.   PERTINENT HISTORY: Hypertension, diabetes, COPD, diabetes, and osteoarthritis  PAIN:  Are you having pain? Yes: NPRS scale: 5/10 Pain location: left hip  Pain description: intermittent sharp pain Aggravating factors: putting pressure on his left foot, walking, steps Relieving factors: sitting and laying down  PRECAUTIONS: None  RED FLAGS: None   WEIGHT BEARING RESTRICTIONS: No  FALLS:  Has patient fallen in last 6 months?  No, but has had a near fall when his pain is very bad  PATIENT GOALS: reduced pain and be able to be on his feet more to garden  NEXT MD VISIT: 03/28/23  OBJECTIVE: all objective assessments were assessed at his initial evaluation on 02/20/23 unless otherwise noted COGNITION: Overall cognitive status: Within functional limits for tasks assessed     SENSATION: Patient reports intermittent numbness in his toes after work.  POSTURE: forward head and decreased lumbar lordosis  PALPATION: TTP: L3-5 spinous processes, left hip flexors, TFL, and proximal quadriceps  LUMBAR ROM:   Active  A/PROM  eval AROM 04/03/23  Flexion 50; familiar pain 56  Extension 10 14; familiar thigh pain   Right lateral flexion    Left lateral flexion    Right rotation    Left rotation     (Blank rows = not tested)   LOWER EXTREMITY ROM:     Active  Right eval Left eval  Hip flexion 122; slight right hip pain  110; slight left hip pain   Hip extension    Hip abduction    Hip adduction    Hip internal rotation  WFL (PROM)  Hip external rotation  Familiar pain with PROM   Knee flexion    Knee extension     Ankle dorsiflexion    Ankle plantarflexion    Ankle inversion    Ankle eversion     (Blank rows = not tested)   LOWER EXTREMITY MMT:  MMT Right eval Right 03/20/23 Right 04/03/23 Left eval Left 03/20/23 Left 04/03/23  Hip flexion 4-/5 4/5 4/5 4-/5 4-/5 4-/5  Hip extension        Hip abduction        Hip adduction        Hip internal rotation        Hip external rotation        Knee flexion 4/5   4/5    Knee extension 5/5   5/5    Ankle dorsiflexion 4/5   4/5    Ankle plantarflexion        Ankle inversion        Ankle eversion         (Blank rows = not tested)  LOWER EXTREMITY SPECIAL TESTS:  Hip special tests: Luisa Hart (FABER) test: positive  and Hip scouring test: positive   GAIT: Assistive device utilized: None Level of assistance: Complete Independence  TODAY'S TREATMENT:                                                                                                                              DATE:                                    04/08/23 EXERCISE LOG  Exercise Repetitions and Resistance Comments  Nustep L4 x17 min   Hip flexion X25 reps BLE   Hip abduction X25 reps BLE   HS curl with extension X25 reps BLE   Standing Marches    Rockerboard X3 mins   Ball squeeze X3 min supine  LAQs 3# x25 reps bil   Seated Marches 3# x 25 reps   Seated clam X3 min; Red theraband   Bridge  Min discomfort in L hip  SLR    SL clam    L figure 4 stretch    LTR     Blank cell = exercise not performed today  03/27/23 EXERCISE LOG  Exercise Repetitions and Resistance Comments  Nustep  L4 x 17 minutes   Rockerboard 4 mins   Forward Step Ups 6" box x 25 reps bil   Side-stepping Airex x 3 mins LLE only        Blank cell = exercise not performed today                                                        PATIENT EDUCATION:  Education details: Plan of care, prognosis, objective findings, and goals for therapy Person educated:  Patient Education method: Explanation Education comprehension: verbalized understanding  HOME EXERCISE PROGRAM: [id number]  ASSESSMENT:  CLINICAL IMPRESSION: Pt arrives for today's treatment session reporting 5/10 left hip pain.  Pt reports pain is more concentrated in his hip joint at this time and no longer radiates down into his RLE. Pt able to tolerate increased reps or time with all exercises today.  Pt reports increased fatigue today due to push mowing his yard earlier today.  Pt reports that he is happy to have a "little bit of pain" and be able to walk in comparison to the pain level he was experiencing prior to therapy.  Pt denies any change in pain level at completion of today's treatment session.  OBJECTIVE IMPAIRMENTS: decreased activity tolerance, decreased mobility, difficulty walking, decreased ROM, decreased strength, impaired tone, and pain.   ACTIVITY LIMITATIONS: carrying, lifting, standing, squatting, and locomotion level  PARTICIPATION LIMITATIONS: meal prep, cleaning, laundry, shopping, community activity, and occupation  PERSONAL FACTORS: Past/current experiences, Time since onset of injury/illness/exacerbation, and 3+ comorbidities: Hypertension, diabetes, COPD, diabetes, and osteoarthritis  are also affecting patient's functional outcome.   REHAB POTENTIAL: Fair    CLINICAL DECISION MAKING: Evolving/moderate complexity  EVALUATION COMPLEXITY: Moderate  GOALS: Goals reviewed with patient? Yes  LONG TERM GOALS: Target date: 03/20/23  Patient will be independent with his HEP. Baseline:  Goal status: MET  2.  Patient will be able to complete his daily activities without his familiar pain exceeding 5/10. Baseline: less than 5/10  Goal status: MET  3.  Patient will improve his hip flexor strength bilaterally to at least 4/5 for improved lower extremity strength. Baseline:  Goal status: PARTIALLY MET  4.  Patient will be able to complete his critical job  demands without being limited by his familiar symptoms. Baseline:  Goal status: ON GOING  PLAN:  PT FREQUENCY: 1x/week  PT DURATION: 4 weeks  PLANNED INTERVENTIONS: Therapeutic exercises, Therapeutic activity, Neuromuscular re-education, Balance training, Patient/Family education, Self Care, Joint mobilization, Electrical stimulation, Spinal mobilization, Cryotherapy, Moist heat, Manual therapy, and Re-evaluation  PLAN FOR NEXT SESSION: NuStep, lumbar and lower extremity strengthening (bridging, straight leg raise, etc.), balance interventions, and modalities as needed  Newman Pies, PTA 04/08/2023, 3:27 PM

## 2023-04-09 ENCOUNTER — Encounter (INDEPENDENT_AMBULATORY_CARE_PROVIDER_SITE_OTHER): Payer: Medicare (Managed Care) | Admitting: Ophthalmology

## 2023-04-09 DIAGNOSIS — H43813 Vitreous degeneration, bilateral: Secondary | ICD-10-CM | POA: Diagnosis not present

## 2023-04-09 DIAGNOSIS — Z7984 Long term (current) use of oral hypoglycemic drugs: Secondary | ICD-10-CM

## 2023-04-09 DIAGNOSIS — E113313 Type 2 diabetes mellitus with moderate nonproliferative diabetic retinopathy with macular edema, bilateral: Secondary | ICD-10-CM | POA: Diagnosis not present

## 2023-04-09 DIAGNOSIS — H33301 Unspecified retinal break, right eye: Secondary | ICD-10-CM | POA: Diagnosis not present

## 2023-04-15 ENCOUNTER — Ambulatory Visit: Payer: Medicare (Managed Care)

## 2023-04-15 DIAGNOSIS — M6281 Muscle weakness (generalized): Secondary | ICD-10-CM

## 2023-04-15 DIAGNOSIS — R262 Difficulty in walking, not elsewhere classified: Secondary | ICD-10-CM

## 2023-04-15 DIAGNOSIS — M5459 Other low back pain: Secondary | ICD-10-CM | POA: Diagnosis not present

## 2023-04-15 DIAGNOSIS — M25552 Pain in left hip: Secondary | ICD-10-CM

## 2023-04-15 NOTE — Therapy (Signed)
OUTPATIENT PHYSICAL THERAPY LOWER EXTREMITY TREATMENT   Patient Name: Jimmy Craig MRN: [id number] DOB:[birth year], 72 y.o., male Today's Date: 04/15/2023  END OF SESSION:  PT End of Session - 04/15/23 1434     Visit Number 14    Number of Visits 16    Date for PT Re-Evaluation 05/02/23    PT Start Time 1430    PT Stop Time 1515    PT Time Calculation (min) 45 min    Activity Tolerance Patient tolerated treatment well    Behavior During Therapy WFL for tasks assessed/performed             Past Medical History:  Diagnosis Date   Arthritis    BPH (benign prostatic hyperplasia)    Chronic back pain    GERD (gastroesophageal reflux disease)    Hypertension    Mixed hyperlipidemia    Type 2 diabetes mellitus (HCC)    Past Surgical History:  Procedure Laterality Date   CATARACT EXTRACTION Right    CATARACT EXTRACTION W/PHACO Left 12/20/2013   Procedure: CATARACT EXTRACTION PHACO AND INTRAOCULAR LENS PLACEMENT (IOC);  Surgeon: Susa Simmonds, MD;  Location: [facility];  Service: Ophthalmology;  Laterality: Left;  CDE: 0.97   Patient Active Problem List   Diagnosis Date Noted   Retinal hole or tear, right 04/25/2022   Vitreous hemorrhage, right eye (HCC) 04/25/2022   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) 04/25/2022   Chronic obstructive pulmonary disease (HCC) 10/22/2021   Lumbago with sciatica, left side 03/16/2021   Constipation 06/01/2020   Osteoarthritis 04/26/2019   Hypertension associated with diabetes (HCC) 09/29/2018   Diabetes (HCC) 08/14/2018   Hyperlipidemia associated with type 2 diabetes mellitus (HCC) 08/14/2018   GERD without esophagitis 08/14/2018   BPH (benign prostatic hyperplasia) 08/14/2018   Iron deficiency anemia 08/14/2018   Spinal stenosis of lumbar region 08/08/2017   Dizzy spells 01/20/2015   PCP: Junie Spencer, FNP  REFERRING PROVIDER: Erick Colace, MD   REFERRING DIAG: Left hip  pain   THERAPY DIAG:  Other low back pain  Pain in left hip  Muscle weakness (generalized)  Difficulty in walking, not elsewhere classified  Rationale for Evaluation and Treatment: Rehabilitation  ONSET DATE: 2 years ago   SUBJECTIVE:   SUBJECTIVE STATEMENT: Patient reports 4/10 left hip pain today.   PERTINENT HISTORY: Hypertension, diabetes, COPD, diabetes, and osteoarthritis  PAIN:  Are you having pain? Yes: NPRS scale: 4/10 Pain location: left hip  Pain description: intermittent sharp pain Aggravating factors: putting pressure on his left foot, walking, steps Relieving factors: sitting and laying down  PRECAUTIONS: None  RED FLAGS: None   WEIGHT BEARING RESTRICTIONS: No  FALLS:  Has patient fallen in last 6 months?  No, but has had a near fall when his pain is very bad  PATIENT GOALS: reduced pain and be able to be on his feet more to garden  NEXT MD VISIT: 03/28/23  OBJECTIVE: all objective assessments were assessed at his initial evaluation on 02/20/23 unless otherwise noted COGNITION: Overall cognitive status: Within functional limits for tasks assessed     SENSATION: Patient reports intermittent numbness in his toes after work.  POSTURE: forward head and decreased lumbar lordosis  PALPATION: TTP: L3-5 spinous processes, left hip flexors, TFL, and proximal quadriceps  LUMBAR ROM:   Active  A/PROM  eval AROM 04/03/23  Flexion 50; familiar pain 56  Extension 10 14; familiar thigh pain   Right lateral flexion  Left lateral flexion    Right rotation    Left rotation     (Blank rows = not tested)   LOWER EXTREMITY ROM:     Active  Right eval Left eval  Hip flexion 122; slight right hip pain  110; slight left hip pain   Hip extension    Hip abduction    Hip adduction    Hip internal rotation  WFL (PROM)  Hip external rotation  Familiar pain with PROM   Knee flexion    Knee extension    Ankle dorsiflexion    Ankle plantarflexion     Ankle inversion    Ankle eversion     (Blank rows = not tested)   LOWER EXTREMITY MMT:  MMT Right eval Right 03/20/23 Right 04/03/23 Left eval Left 03/20/23 Left 04/03/23  Hip flexion 4-/5 4/5 4/5 4-/5 4-/5 4-/5  Hip extension        Hip abduction        Hip adduction        Hip internal rotation        Hip external rotation        Knee flexion 4/5   4/5    Knee extension 5/5   5/5    Ankle dorsiflexion 4/5   4/5    Ankle plantarflexion        Ankle inversion        Ankle eversion         (Blank rows = not tested)  LOWER EXTREMITY SPECIAL TESTS:  Hip special tests: Luisa Hart (FABER) test: positive  and Hip scouring test: positive   GAIT: Assistive device utilized: None Level of assistance: Complete Independence  TODAY'S TREATMENT:                                                                                                                              DATE:                                    04/15/23 EXERCISE LOG  Exercise Repetitions and Resistance Comments  Nustep L4 x17 min   Hip flexion 2# x20 reps BLE   Hip abduction 2# x20 reps BLE   HS curl with extension 2# x20  reps BLE   Standing Marches    Rockerboard x4 mins   Ball squeeze x3 min supine  LAQs 3# x25 reps bil   Seated Marches 3# x 25 reps   Seated clam X3 min; Green [employer] discomfort in L hip  SLR    SL clam    L figure 4 stretch    LTR     Blank cell = exercise not performed today  PATIENT EDUCATION:  Education details: Plan of care, prognosis, objective findings, and goals for therapy Person educated: Patient Education method: Explanation Education comprehension: verbalized understanding  HOME EXERCISE PROGRAM: [email or web address]  ASSESSMENT:  CLINICAL IMPRESSION: Pt arrives for today's treatment session 4/10 left hip pain.  Pt able to tolerate addition of 2 pound weight to standing exercises today with minimal fatigue reported.  Pt able to  tolerate increased resistance or time with all seated exercises today without issue.  Pt reports contacting his MD about possible injections in lumbar spine after completion of PT.  Pt reported 3/10 left hip pain at completion of today's treatment session.  OBJECTIVE IMPAIRMENTS: decreased activity tolerance, decreased mobility, difficulty walking, decreased ROM, decreased strength, impaired tone, and pain.   ACTIVITY LIMITATIONS: carrying, lifting, standing, squatting, and locomotion level  PARTICIPATION LIMITATIONS: meal prep, cleaning, laundry, shopping, community activity, and occupation  PERSONAL FACTORS: Past/current experiences, Time since onset of injury/illness/exacerbation, and 3+ comorbidities: Hypertension, diabetes, COPD, diabetes, and osteoarthritis  are also affecting patient's functional outcome.   REHAB POTENTIAL: Fair    CLINICAL DECISION MAKING: Evolving/moderate complexity  EVALUATION COMPLEXITY: Moderate  GOALS: Goals reviewed with patient? Yes  LONG TERM GOALS: Target date: 03/20/23  Patient will be independent with his HEP. Baseline:  Goal status: MET  2.  Patient will be able to complete his daily activities without his familiar pain exceeding 5/10. Baseline: less than 5/10  Goal status: MET  3.  Patient will improve his hip flexor strength bilaterally to at least 4/5 for improved lower extremity strength. Baseline:  Goal status: PARTIALLY MET  4.  Patient will be able to complete his critical job demands without being limited by his familiar symptoms. Baseline:  Goal status: ON GOING  PLAN:  PT FREQUENCY: 1x/week  PT DURATION: 4 weeks  PLANNED INTERVENTIONS: Therapeutic exercises, Therapeutic activity, Neuromuscular re-education, Balance training, Patient/Family education, Self Care, Joint mobilization, Electrical stimulation, Spinal mobilization, Cryotherapy, Moist heat, Manual therapy, and Re-evaluation  PLAN FOR NEXT SESSION: NuStep, lumbar and  lower extremity strengthening (bridging, straight leg raise, etc.), balance interventions, and modalities as needed  Newman Pies, PTA 04/15/2023, 3:30 PM

## 2023-04-17 ENCOUNTER — Other Ambulatory Visit: Payer: Self-pay | Admitting: Family

## 2023-04-23 ENCOUNTER — Ambulatory Visit: Payer: Medicare (Managed Care)

## 2023-04-23 DIAGNOSIS — M5459 Other low back pain: Secondary | ICD-10-CM | POA: Diagnosis not present

## 2023-04-23 DIAGNOSIS — M6281 Muscle weakness (generalized): Secondary | ICD-10-CM

## 2023-04-23 DIAGNOSIS — M25552 Pain in left hip: Secondary | ICD-10-CM

## 2023-04-23 NOTE — Therapy (Signed)
OUTPATIENT PHYSICAL THERAPY LOWER EXTREMITY TREATMENT   Patient Name: Jimmy Craig MRN: [id number] DOB:06-19-1951, 72 y.o., male Today's Date: 04/23/2023  END OF SESSION:  PT End of Session - 04/23/23 1348     Visit Number 15    Number of Visits 16    Date for PT Re-Evaluation 05/02/23    PT Start Time 1345    PT Stop Time 1427    PT Time Calculation (min) 42 min    Activity Tolerance Patient tolerated treatment well    Behavior During Therapy WFL for tasks assessed/performed             Past Medical History:  Diagnosis Date   Arthritis    BPH (benign prostatic hyperplasia)    Chronic back pain    GERD (gastroesophageal reflux disease)    Hypertension    Mixed hyperlipidemia    Type 2 diabetes mellitus (HCC)    Past Surgical History:  Procedure Laterality Date   CATARACT EXTRACTION Right    CATARACT EXTRACTION W/PHACO Left 12/20/2013   Procedure: CATARACT EXTRACTION PHACO AND INTRAOCULAR LENS PLACEMENT (IOC);  Surgeon: Susa Simmonds, MD;  Location: [facility];  Service: Ophthalmology;  Laterality: Left;  CDE: 0.97   Patient Active Problem List   Diagnosis Date Noted   Retinal hole or tear, right 04/25/2022   Vitreous hemorrhage, right eye (HCC) 04/25/2022   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) 04/25/2022   Chronic obstructive pulmonary disease (HCC) 10/22/2021   Lumbago with sciatica, left side 03/16/2021   Constipation 06/01/2020   Osteoarthritis 04/26/2019   Hypertension associated with diabetes (HCC) 09/29/2018   Diabetes (HCC) 08/14/2018   Hyperlipidemia associated with type 2 diabetes mellitus (HCC) 08/14/2018   GERD without esophagitis 08/14/2018   BPH (benign prostatic hyperplasia) 08/14/2018   Iron deficiency anemia 08/14/2018   Spinal stenosis of lumbar region 08/08/2017   Dizzy spells 01/20/2015   PCP: Junie Spencer, FNP  REFERRING PROVIDER: Erick Colace, MD   REFERRING DIAG: Left hip  pain   THERAPY DIAG:  Other low back pain  Pain in left hip  Muscle weakness (generalized)  Rationale for Evaluation and Treatment: Rehabilitation  ONSET DATE: 2 years ago   SUBJECTIVE:   SUBJECTIVE STATEMENT: Patient reports 7/10 left hip pain today.  Pt unable to identify cause of increase in pain.   PERTINENT HISTORY: Hypertension, diabetes, COPD, diabetes, and osteoarthritis  PAIN:  Are you having pain? Yes: NPRS scale: 7/10 Pain location: left hip  Pain description: intermittent sharp pain Aggravating factors: putting pressure on his left foot, walking, steps Relieving factors: sitting and laying down  PRECAUTIONS: None  RED FLAGS: None   WEIGHT BEARING RESTRICTIONS: No  FALLS:  Has patient fallen in last 6 months?  No, but has had a near fall when his pain is very bad  PATIENT GOALS: reduced pain and be able to be on his feet more to garden  NEXT MD VISIT: 03/28/23  OBJECTIVE: all objective assessments were assessed at his initial evaluation on 02/20/23 unless otherwise noted COGNITION: Overall cognitive status: Within functional limits for tasks assessed     SENSATION: Patient reports intermittent numbness in his toes after work.  POSTURE: forward head and decreased lumbar lordosis  PALPATION: TTP: L3-5 spinous processes, left hip flexors, TFL, and proximal quadriceps  LUMBAR ROM:   Active  A/PROM  eval AROM 04/03/23  Flexion 50; familiar pain 56  Extension 10 14; familiar thigh pain   Right  lateral flexion    Left lateral flexion    Right rotation    Left rotation     (Blank rows = not tested)   LOWER EXTREMITY ROM:     Active  Right eval Left eval  Hip flexion 122; slight right hip pain  110; slight left hip pain   Hip extension    Hip abduction    Hip adduction    Hip internal rotation  WFL (PROM)  Hip external rotation  Familiar pain with PROM   Knee flexion    Knee extension    Ankle dorsiflexion    Ankle plantarflexion     Ankle inversion    Ankle eversion     (Blank rows = not tested)   LOWER EXTREMITY MMT:  MMT Right eval Right 03/20/23 Right 04/03/23 Left eval Left 03/20/23 Left 04/03/23  Hip flexion 4-/5 4/5 4/5 4-/5 4-/5 4-/5  Hip extension        Hip abduction        Hip adduction        Hip internal rotation        Hip external rotation        Knee flexion 4/5   4/5    Knee extension 5/5   5/5    Ankle dorsiflexion 4/5   4/5    Ankle plantarflexion        Ankle inversion        Ankle eversion         (Blank rows = not tested)  LOWER EXTREMITY SPECIAL TESTS:  Hip special tests: Luisa Hart (FABER) test: positive  and Hip scouring test: positive   GAIT: Assistive device utilized: None Level of assistance: Complete Independence  TODAY'S TREATMENT:                                                                                                                              DATE:                                    04/23/23 EXERCISE LOG  Exercise Repetitions and Resistance Comments  Nustep L4 x17 min   Hip flexion X25 reps bil   Hip abduction X25 reps bil   HS curl with extension X25 reps bil   Standing Marches    Rockerboard x4 mins   Ball squeeze x3 min supine  LAQs Red x25 reps bil   Seated Marches Red x 25 reps bil   Seated clam X3 min; Red theraband   Bridge  Min discomfort in L hip  SLR    SL clam    L figure 4 stretch    LTR     Blank cell = exercise not performed today  PATIENT EDUCATION:  Education details: Plan of care, prognosis, objective findings, and goals for therapy Person educated: Patient Education method: Explanation Education comprehension: verbalized understanding  HOME EXERCISE PROGRAM: [email or web address] (updated) 9/18  ASSESSMENT:  CLINICAL IMPRESSION: Pt arrives for today's treatment session reporting 7/10 left hip pain.  Pt unable to identify the cause of the increase in pain.  Pt given printed HEP of all seated and  standing exercises performed today.  Reviewed all exercises with pt with cues for proper technique, posture, and pacing.  Pt able to demonstrate and verbalize understanding of all concepts.  Pt has MD appointment on 10/2 with possibility of injections.  Pt will be ready to discharge at next treatment session.  Pt denied any change in pain at completion of today's treatment session.  OBJECTIVE IMPAIRMENTS: decreased activity tolerance, decreased mobility, difficulty walking, decreased ROM, decreased strength, impaired tone, and pain.   ACTIVITY LIMITATIONS: carrying, lifting, standing, squatting, and locomotion level  PARTICIPATION LIMITATIONS: meal prep, cleaning, laundry, shopping, community activity, and occupation  PERSONAL FACTORS: Past/current experiences, Time since onset of injury/illness/exacerbation, and 3+ comorbidities: Hypertension, diabetes, COPD, diabetes, and osteoarthritis  are also affecting patient's functional outcome.   REHAB POTENTIAL: Fair    CLINICAL DECISION MAKING: Evolving/moderate complexity  EVALUATION COMPLEXITY: Moderate  GOALS: Goals reviewed with patient? Yes  LONG TERM GOALS: Target date: 03/20/23  Patient will be independent with his HEP. Baseline:  Goal status: MET  2.  Patient will be able to complete his daily activities without his familiar pain exceeding 5/10. Baseline: less than 5/10  Goal status: MET  3.  Patient will improve his hip flexor strength bilaterally to at least 4/5 for improved lower extremity strength. Baseline:  Goal status: PARTIALLY MET  4.  Patient will be able to complete his critical job demands without being limited by his familiar symptoms. Baseline:  Goal status: ON GOING  PLAN:  PT FREQUENCY: 1x/week  PT DURATION: 4 weeks  PLANNED INTERVENTIONS: Therapeutic exercises, Therapeutic activity, Neuromuscular re-education, Balance training, Patient/Family education, Self Care, Joint mobilization, Electrical  stimulation, Spinal mobilization, Cryotherapy, Moist heat, Manual therapy, and Re-evaluation  PLAN FOR NEXT SESSION: NuStep, lumbar and lower extremity strengthening (bridging, straight leg raise, etc.), balance interventions, and modalities as needed  Newman Pies, PTA 04/23/2023, 2:28 PM

## 2023-04-24 ENCOUNTER — Other Ambulatory Visit: Payer: Self-pay | Admitting: Family Medicine

## 2023-04-24 DIAGNOSIS — J189 Pneumonia, unspecified organism: Secondary | ICD-10-CM

## 2023-04-29 ENCOUNTER — Other Ambulatory Visit: Payer: Self-pay | Admitting: Family

## 2023-04-29 ENCOUNTER — Ambulatory Visit: Payer: Medicare (Managed Care)

## 2023-04-29 DIAGNOSIS — E1142 Type 2 diabetes mellitus with diabetic polyneuropathy: Secondary | ICD-10-CM

## 2023-04-29 DIAGNOSIS — M5459 Other low back pain: Secondary | ICD-10-CM

## 2023-04-29 DIAGNOSIS — M25552 Pain in left hip: Secondary | ICD-10-CM

## 2023-04-29 DIAGNOSIS — M6281 Muscle weakness (generalized): Secondary | ICD-10-CM

## 2023-04-29 DIAGNOSIS — R262 Difficulty in walking, not elsewhere classified: Secondary | ICD-10-CM

## 2023-04-29 NOTE — Therapy (Addendum)
OUTPATIENT PHYSICAL THERAPY LOWER EXTREMITY TREATMENT   Patient Name: Jimmy Craig MRN: [id number] DOB:1950-08-23, 72 y.o., male Today's Date: 04/29/2023  END OF SESSION:  PT End of Session - 04/29/23 1438     Visit Number 16    Number of Visits 16    Date for PT Re-Evaluation 05/02/23    PT Start Time 1430    PT Stop Time 1515    PT Time Calculation (min) 45 min    Activity Tolerance Patient tolerated treatment well    Behavior During Therapy [facility] for tasks assessed/performed             Past Medical History:  Diagnosis Date   Arthritis    BPH (benign prostatic hyperplasia)    Chronic back pain    GERD (gastroesophageal reflux disease)    Hypertension    Mixed hyperlipidemia    Type 2 diabetes mellitus (HCC)    Past Surgical History:  Procedure Laterality Date   CATARACT EXTRACTION Right    CATARACT EXTRACTION W/PHACO Left 12/20/2013   Procedure: CATARACT EXTRACTION PHACO AND INTRAOCULAR LENS PLACEMENT (IOC);  Surgeon: Susa Simmonds, MD;  Location: [facility];  Service: Ophthalmology;  Laterality: Left;  CDE: 0.97   Patient Active Problem List   Diagnosis Date Noted   Retinal hole or tear, right 04/25/2022   Vitreous hemorrhage, right eye (HCC) 04/25/2022   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) 04/25/2022   Chronic obstructive pulmonary disease (HCC) 10/22/2021   Lumbago with sciatica, left side 03/16/2021   Constipation 06/01/2020   Osteoarthritis 04/26/2019   Hypertension associated with diabetes (HCC) 09/29/2018   Diabetes (HCC) 08/14/2018   Hyperlipidemia associated with type 2 diabetes mellitus (HCC) 08/14/2018   GERD without esophagitis 08/14/2018   BPH (benign prostatic hyperplasia) 08/14/2018   Iron deficiency anemia 08/14/2018   Spinal stenosis of lumbar region 08/08/2017   Dizzy spells 01/20/2015   PCP: Junie Spencer, FNP  REFERRING PROVIDER: Erick Colace, MD   REFERRING DIAG: Left hip  pain   THERAPY DIAG:  Other low back pain  Pain in left hip  Muscle weakness (generalized)  Difficulty in walking, not elsewhere classified  Rationale for Evaluation and Treatment: Rehabilitation  ONSET DATE: 2 years ago   SUBJECTIVE:   SUBJECTIVE STATEMENT: Patient reports minimal left hip pain today.  Ready for discharge.  PERTINENT HISTORY: Hypertension, diabetes, COPD, diabetes, and osteoarthritis  PAIN:  Are you having pain? Yes: NPRS scale: no number given/10 Pain location: left hip  Pain description: intermittent sharp pain Aggravating factors: putting pressure on his left foot, walking, steps Relieving factors: sitting and laying down  PRECAUTIONS: None  RED FLAGS: None   WEIGHT BEARING RESTRICTIONS: No  FALLS:  Has patient fallen in last 6 months?  No, but has had a near fall when his pain is very bad  PATIENT GOALS: reduced pain and be able to be on his feet more to garden  NEXT MD VISIT: 03/28/23  OBJECTIVE: all objective assessments were assessed at his initial evaluation on 02/20/23 unless otherwise noted COGNITION: Overall cognitive status: Within functional limits for tasks assessed     SENSATION: Patient reports intermittent numbness in his toes after work.  POSTURE: forward head and decreased lumbar lordosis  PALPATION: TTP: L3-5 spinous processes, left hip flexors, TFL, and proximal quadriceps  LUMBAR ROM:   Active  A/PROM  eval AROM 04/03/23  Flexion 50; familiar pain 56  Extension 10 14; familiar thigh pain  Right lateral flexion    Left lateral flexion    Right rotation    Left rotation     (Blank rows = not tested)   LOWER EXTREMITY ROM:     Active  Right eval Left eval  Hip flexion 122; slight right hip pain  110; slight left hip pain   Hip extension    Hip abduction    Hip adduction    Hip internal rotation  WFL (PROM)  Hip external rotation  Familiar pain with PROM   Knee flexion    Knee extension    Ankle  dorsiflexion    Ankle plantarflexion    Ankle inversion    Ankle eversion     (Blank rows = not tested)   LOWER EXTREMITY MMT:  MMT Right eval Right 03/20/23 Right 04/03/23 Left eval Left 03/20/23 Left 04/03/23  Hip flexion 4-/5 4/5 4/5 4-/5 4-/5 4-/5  Hip extension        Hip abduction        Hip adduction        Hip internal rotation        Hip external rotation        Knee flexion 4/5   4/5    Knee extension 5/5   5/5    Ankle dorsiflexion 4/5   4/5    Ankle plantarflexion        Ankle inversion        Ankle eversion         (Blank rows = not tested)  LOWER EXTREMITY SPECIAL TESTS:  Hip special tests: Luisa Hart (FABER) test: positive  and Hip scouring test: positive   GAIT: Assistive device utilized: None Level of assistance: Complete Independence  TODAY'S TREATMENT:                                                                                                                              DATE:                                    04/29/23 EXERCISE LOG  Exercise Repetitions and Resistance Comments  Nustep L4 x17 min   Hip flexion x30 reps bil   Hip abduction x30 reps bil   HS curl with extension x30 reps bil   Standing Marches    Rockerboard X4.5 mins   Ball squeeze x3 min supine  LAQs 3# x30 reps bil   Seated Marches 3# x30 reps bil   Seated clam X3 min; Green theraband   Bridge  Min discomfort in L hip  SLR    SL clam    L figure 4 stretch    LTR     Blank cell = exercise not performed today  PATIENT EDUCATION:  Education details: Plan of care, prognosis, objective findings, and goals for therapy Person educated: Patient Education method: Explanation Education comprehension: verbalized understanding  HOME EXERCISE PROGRAM: [email or web address] (updated) 9/18  ASSESSMENT:  CLINICAL IMPRESSION: Pt arrives for today's treatment session reporting minimal left hip pain.  Pt continues to exhibit difficult and left hip pain with job  requirements, with that goal being the only goal he has not met at this time.  Pt reports performing exercises at home without difficulty.  Pt bale to tolerate increase in reps with all exercises today without issue.  Pt has a MD appointment on 10/8 with hopes of getting an injection.  Pt denies any change in pain at completion of today's treatment session.  Pt ready for discharge at this time.   PHYSICAL THERAPY DISCHARGE SUMMARY  Visits from Start of Care: 16  Current functional level related to goals / functional outcomes: Patient was able to meet most of his goals for skilled physical therapy. He reported feeling comfortable being discharged at this time.    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 Patient goals were partially met. Patient is being discharged due to being pleased with the current functional level.  Candi Leash, PT, DPT    OBJECTIVE IMPAIRMENTS: decreased activity tolerance, decreased mobility, difficulty walking, decreased ROM, decreased strength, impaired tone, and pain.   ACTIVITY LIMITATIONS: carrying, lifting, standing, squatting, and locomotion level  PARTICIPATION LIMITATIONS: meal prep, cleaning, laundry, shopping, community activity, and occupation  PERSONAL FACTORS: Past/current experiences, Time since onset of injury/illness/exacerbation, and 3+ comorbidities: Hypertension, diabetes, COPD, diabetes, and osteoarthritis  are also affecting patient's functional outcome.   REHAB POTENTIAL: Fair    CLINICAL DECISION MAKING: Evolving/moderate complexity  EVALUATION COMPLEXITY: Moderate  GOALS: Goals reviewed with patient? Yes  LONG TERM GOALS: Target date: 03/20/23  Patient will be independent with his HEP. Baseline:  Goal status: MET  2.  Patient will be able to complete his daily activities without his familiar pain exceeding 5/10. Baseline: less than 5/10  Goal status:  MET  3.  Patient will improve his hip flexor strength bilaterally to at least 4/5 for improved lower extremity strength. Baseline:  Goal status: MET  4.  Patient will be able to complete his critical job demands without being limited by his familiar symptoms. Baseline:  Goal status: ON GOING  PLAN:  PT FREQUENCY: 1x/week  PT DURATION: 4 weeks  PLANNED INTERVENTIONS: Therapeutic exercises, Therapeutic activity, Neuromuscular re-education, Balance training, Patient/Family education, Self Care, Joint mobilization, Electrical stimulation, Spinal mobilization, Cryotherapy, Moist heat, Manual therapy, and Re-evaluation  PLAN FOR NEXT SESSION: NuStep, lumbar and lower extremity strengthening (bridging, straight leg raise, etc.), balance interventions, and modalities as needed  Newman Pies, PTA 04/29/2023, 4:07 PM

## 2023-05-07 ENCOUNTER — Encounter (INDEPENDENT_AMBULATORY_CARE_PROVIDER_SITE_OTHER): Payer: Medicare (Managed Care) | Admitting: Ophthalmology

## 2023-05-07 DIAGNOSIS — Z7984 Long term (current) use of oral hypoglycemic drugs: Secondary | ICD-10-CM

## 2023-05-07 DIAGNOSIS — H43813 Vitreous degeneration, bilateral: Secondary | ICD-10-CM | POA: Diagnosis not present

## 2023-05-07 DIAGNOSIS — H33301 Unspecified retinal break, right eye: Secondary | ICD-10-CM

## 2023-05-07 DIAGNOSIS — E113313 Type 2 diabetes mellitus with moderate nonproliferative diabetic retinopathy with macular edema, bilateral: Secondary | ICD-10-CM | POA: Diagnosis not present

## 2023-05-13 ENCOUNTER — Encounter: Payer: Self-pay | Admitting: Physical Medicine & Rehabilitation

## 2023-05-13 ENCOUNTER — Encounter
Payer: Medicare (Managed Care) | Attending: Physical Medicine & Rehabilitation | Admitting: Physical Medicine & Rehabilitation

## 2023-05-13 VITALS — BP 158/77 | HR 76 | Ht 65.0 in | Wt 137.0 lb

## 2023-05-13 DIAGNOSIS — M48062 Spinal stenosis, lumbar region with neurogenic claudication: Secondary | ICD-10-CM | POA: Insufficient documentation

## 2023-05-13 MED ORDER — IOHEXOL 180 MG/ML  SOLN
3.0000 mL | Freq: Once | INTRAMUSCULAR | Status: AC
  Administered 2023-05-13: 3 mL

## 2023-05-13 MED ORDER — LIDOCAINE HCL 1 % IJ SOLN
5.0000 mL | Freq: Once | INTRAMUSCULAR | Status: AC
Start: 2023-05-13 — End: 2023-05-13
  Administered 2023-05-13: 5 mL

## 2023-05-13 MED ORDER — BETAMETHASONE SOD PHOS & ACET 6 (3-3) MG/ML IJ SUSP
12.0000 mg | Freq: Once | INTRAMUSCULAR | Status: AC
Start: 2023-05-13 — End: 2023-05-13
  Administered 2023-05-13: 12 mg via INTRAMUSCULAR

## 2023-05-13 MED ORDER — LIDOCAINE HCL (PF) 1 % IJ SOLN
2.0000 mL | Freq: Once | INTRAMUSCULAR | Status: AC
Start: 2023-05-13 — End: 2023-05-13
  Administered 2023-05-13: 2 mL

## 2023-05-13 NOTE — Patient Instructions (Addendum)
You received an epidural steroid injection under fluoroscopic guidance. This is the most accurate way to perform an epidural injection. This injection was performed to relieve thigh or leg or foot pain that may be related to a pinched nerve in the lumbar spine. The local anesthetic injected today may cause numbness in your leg for a couple hours. If it is severe we may need to observe you for 30-60 minutes after the injection. The cortisone medicine injected today may take several days to take full effect. This medicine can also cause facial flushing or feeling of being warm.  This injection may last for days weeks or months. It can be repeated if needed. If it is not effective, another spinal level may need to be injected. Other treatments include medication management as well as physical therapy. In some cases surgery may be an option.   Resume Plavix tomorrow

## 2023-05-13 NOTE — Progress Notes (Signed)
Caudal epidural injection under fluoro guidance  Informed consent was obtained after describing risks and benefits of the procedure the patient is include bleeding bruising and infection she elects to proceed and has given written consent. Patient placed prone on fluoroscopy table Betadine prep Sterile drape 25-gauge 1.5 inch needle was used to anesthetize the skin and subcutaneous tissue with 1% lidocaine x 5ml after identifying the sacral hiatus on fluoroscopic AP views Then a 22-gauge 3.5 inch needle was inserted into the sacral hiatus. Lateral views were utilized to maneuver the needle into the sacral canal Under AP views 3 cc of Omnipaque 180 injected  demonstrating appropriate spread of injectate Then a solution containing 12 mg of Celestone and 3 cc of 1% MPF of lidocaine were injected. Patient tolerated procedure well.  AP and lateral views saved  Post procedure instructions given

## 2023-05-13 NOTE — Progress Notes (Signed)
PROCEDURE RECORD [facility]   Name: Timothee Sisney DOB:04/01/1951 MRN: [id number]  Date:05/13/2023  Physician: Claudette Laws, MD    Nurse/CMA: Tiera Royal  Allergies: No Known Allergies  Consent Signed: Yes.    Is patient diabetic? Yes.    CBG today? 70  Pregnant: No. LMP: No LMP for male patient. (age 70-55)  Anticoagulants: no Anti-inflammatory: no Antibiotics:  no  Procedure: Epidural Steroid Injection  Position: Prone Start Time: 1:05PM  End Time: 1:09PM  Fluoro Time: 20  RN/CMA Dayvion Sans MA Royal RMA     Time 12:37 pm 1:12 pm    BP 158/77 136/68    Pulse 76 89    Respirations 16 16    O2 Sat 96 98    S/S 6 6    Pain Level 5/10 2/10     D/C home with Son, patient A & O X 3, D/C instructions reviewed, and sits independently.

## 2023-05-17 ENCOUNTER — Other Ambulatory Visit: Payer: Self-pay | Admitting: Family

## 2023-05-17 ENCOUNTER — Other Ambulatory Visit: Payer: Self-pay | Admitting: Physical Medicine & Rehabilitation

## 2023-05-17 DIAGNOSIS — J449 Chronic obstructive pulmonary disease, unspecified: Secondary | ICD-10-CM

## 2023-05-17 DIAGNOSIS — N401 Enlarged prostate with lower urinary tract symptoms: Secondary | ICD-10-CM

## 2023-05-17 DIAGNOSIS — J189 Pneumonia, unspecified organism: Secondary | ICD-10-CM

## 2023-05-30 ENCOUNTER — Ambulatory Visit: Payer: Medicare Other | Admitting: Family

## 2023-05-30 ENCOUNTER — Other Ambulatory Visit: Payer: Self-pay | Admitting: Family

## 2023-05-30 DIAGNOSIS — M150 Primary generalized (osteo)arthritis: Secondary | ICD-10-CM

## 2023-05-30 DIAGNOSIS — E1142 Type 2 diabetes mellitus with diabetic polyneuropathy: Secondary | ICD-10-CM

## 2023-05-30 DIAGNOSIS — J189 Pneumonia, unspecified organism: Secondary | ICD-10-CM

## 2023-05-30 DIAGNOSIS — M25559 Pain in unspecified hip: Secondary | ICD-10-CM

## 2023-05-30 DIAGNOSIS — J449 Chronic obstructive pulmonary disease, unspecified: Secondary | ICD-10-CM

## 2023-05-30 DIAGNOSIS — E1169 Type 2 diabetes mellitus with other specified complication: Secondary | ICD-10-CM

## 2023-05-30 DIAGNOSIS — N401 Enlarged prostate with lower urinary tract symptoms: Secondary | ICD-10-CM

## 2023-05-30 DIAGNOSIS — E1159 Type 2 diabetes mellitus with other circulatory complications: Secondary | ICD-10-CM

## 2023-05-30 MED ORDER — METFORMIN HCL 1000 MG PO TABS: 1000.0000 mg | ORAL_TABLET | Freq: Two times a day (BID) | ORAL | 0 refills | Status: DC

## 2023-05-30 MED ORDER — LISINOPRIL 10 MG PO TABS: 10.0000 mg | ORAL_TABLET | Freq: Every day | ORAL | 3 refills | Status: DC

## 2023-05-30 MED ORDER — TAMSULOSIN HCL 0.4 MG PO CAPS: 0.4000 mg | ORAL_CAPSULE | Freq: Two times a day (BID) | ORAL | 3 refills | Status: DC

## 2023-05-30 MED ORDER — DAPAGLIFLOZIN PROPANEDIOL 10 MG PO TABS
10.0000 mg | ORAL_TABLET | Freq: Every day | ORAL | 1 refills | Status: DC
Start: 2023-05-30 — End: 2024-01-02

## 2023-05-30 MED ORDER — CELECOXIB 200 MG PO CAPS
200.0000 mg | ORAL_CAPSULE | Freq: Every day | ORAL | 0 refills | Status: DC
Start: 2023-05-30 — End: 2023-11-03

## 2023-05-30 MED ORDER — ALBUTEROL SULFATE HFA 108 (90 BASE) MCG/ACT IN AERS
2.0000 | INHALATION_SPRAY | Freq: Four times a day (QID) | RESPIRATORY_TRACT | 2 refills | Status: DC | PRN
Start: 2023-05-30 — End: 2024-06-24

## 2023-05-30 MED ORDER — BACLOFEN 10 MG PO TABS: 10.0000 mg | ORAL_TABLET | Freq: Three times a day (TID) | ORAL | 0 refills | Status: DC

## 2023-05-30 MED ORDER — FLUTICASONE-SALMETEROL 100-50 MCG/ACT IN AEPB: 1.0000 | INHALATION_SPRAY | Freq: Two times a day (BID) | RESPIRATORY_TRACT | 2 refills | Status: DC

## 2023-05-30 MED ORDER — PREGABALIN 150 MG PO CAPS: 150.0000 mg | ORAL_CAPSULE | Freq: Three times a day (TID) | ORAL | 1 refills | Status: DC

## 2023-05-30 MED ORDER — TRESIBA FLEXTOUCH 200 UNIT/ML ~~LOC~~ SOPN: 42.0000 [IU] | PEN_INJECTOR | Freq: Every day | SUBCUTANEOUS | 2 refills | Status: DC

## 2023-05-30 MED ORDER — PANTOPRAZOLE SODIUM 40 MG PO TBEC: 40.0000 mg | DELAYED_RELEASE_TABLET | Freq: Every day | ORAL | 0 refills | Status: DC

## 2023-05-30 MED ORDER — TRAMADOL HCL 50 MG PO TABS: 100.0000 mg | ORAL_TABLET | Freq: Two times a day (BID) | ORAL | 0 refills | Status: DC

## 2023-05-30 MED ORDER — TRULICITY 4.5 MG/0.5ML ~~LOC~~ SOAJ: 4.5000 mg | SUBCUTANEOUS | 2 refills | Status: DC

## 2023-05-30 MED ORDER — ATORVASTATIN CALCIUM 40 MG PO TABS: 40.0000 mg | ORAL_TABLET | Freq: Every day | ORAL | 1 refills | Status: DC

## 2023-05-30 MED ORDER — TRESIBA FLEXTOUCH 100 UNIT/ML ~~LOC~~ SOPN: 42.0000 [IU] | PEN_INJECTOR | Freq: Every day | SUBCUTANEOUS | 2 refills | Status: DC

## 2023-05-30 MED ORDER — CETIRIZINE HCL 10 MG PO TABS: 10.0000 mg | ORAL_TABLET | Freq: Every day | ORAL | 2 refills | Status: AC

## 2023-05-30 NOTE — Progress Notes (Signed)
Pt had appointment today, but he dose not currently have insurance. [place] is [place] country this [place] for one [place]. I will refill all his medications today.   Jannifer Rodney, FNP

## 2023-07-07 ENCOUNTER — Encounter (INDEPENDENT_AMBULATORY_CARE_PROVIDER_SITE_OTHER): Payer: Self-pay | Admitting: Ophthalmology

## 2023-07-10 ENCOUNTER — Ambulatory Visit: Payer: Medicare Other

## 2023-07-15 ENCOUNTER — Encounter: Payer: Self-pay | Admitting: Physical Medicine & Rehabilitation

## 2023-07-18 ENCOUNTER — Ambulatory Visit: Payer: Medicare Other | Admitting: Family Medicine

## 2023-08-19 ENCOUNTER — Other Ambulatory Visit: Payer: Self-pay | Admitting: Family

## 2023-08-21 ENCOUNTER — Other Ambulatory Visit: Payer: Self-pay | Admitting: Family

## 2023-08-21 DIAGNOSIS — J301 Allergic rhinitis due to pollen: Secondary | ICD-10-CM

## 2023-08-28 ENCOUNTER — Other Ambulatory Visit: Payer: Self-pay | Admitting: Family

## 2023-08-28 DIAGNOSIS — J301 Allergic rhinitis due to pollen: Secondary | ICD-10-CM

## 2023-09-18 ENCOUNTER — Other Ambulatory Visit: Payer: Self-pay | Admitting: Family

## 2023-09-22 ENCOUNTER — Ambulatory Visit: Payer: Medicare Other | Admitting: Family Medicine

## 2023-09-23 ENCOUNTER — Telehealth: Payer: Self-pay | Admitting: *Deleted

## 2023-09-23 NOTE — Telephone Encounter (Signed)
 Fax from [employer] RE: Flutic/Salmeter 100/50 mcg AER Note: Ins now prefers Advair HFA, Jerral Ralph or Breo Elipta Please advise

## 2023-09-25 ENCOUNTER — Ambulatory Visit (INDEPENDENT_AMBULATORY_CARE_PROVIDER_SITE_OTHER): Payer: Medicare Other | Admitting: Family

## 2023-09-25 ENCOUNTER — Encounter: Payer: Self-pay | Admitting: Family

## 2023-09-25 VITALS — BP 112/60 | HR 100 | Ht 65.0 in | Wt 145.2 lb

## 2023-09-25 DIAGNOSIS — G8929 Other chronic pain: Secondary | ICD-10-CM

## 2023-09-25 DIAGNOSIS — R143 Flatulence: Secondary | ICD-10-CM

## 2023-09-25 DIAGNOSIS — E785 Hyperlipidemia, unspecified: Secondary | ICD-10-CM

## 2023-09-25 DIAGNOSIS — J449 Chronic obstructive pulmonary disease, unspecified: Secondary | ICD-10-CM

## 2023-09-25 DIAGNOSIS — N401 Enlarged prostate with lower urinary tract symptoms: Secondary | ICD-10-CM

## 2023-09-25 DIAGNOSIS — M5442 Lumbago with sciatica, left side: Secondary | ICD-10-CM

## 2023-09-25 DIAGNOSIS — R351 Nocturia: Secondary | ICD-10-CM

## 2023-09-25 DIAGNOSIS — K219 Gastro-esophageal reflux disease without esophagitis: Secondary | ICD-10-CM | POA: Diagnosis not present

## 2023-09-25 DIAGNOSIS — E1142 Type 2 diabetes mellitus with diabetic polyneuropathy: Secondary | ICD-10-CM

## 2023-09-25 DIAGNOSIS — E1159 Type 2 diabetes mellitus with other circulatory complications: Secondary | ICD-10-CM

## 2023-09-25 DIAGNOSIS — Z794 Long term (current) use of insulin: Secondary | ICD-10-CM

## 2023-09-25 DIAGNOSIS — E1169 Type 2 diabetes mellitus with other specified complication: Secondary | ICD-10-CM

## 2023-09-25 DIAGNOSIS — M150 Primary generalized (osteo)arthritis: Secondary | ICD-10-CM

## 2023-09-25 MED ORDER — SIMETHICONE 125 MG PO CHEW: 125.0000 mg | CHEWABLE_TABLET | Freq: Four times a day (QID) | ORAL | 2 refills | Status: AC | PRN

## 2023-09-25 MED ORDER — TRULICITY 4.5 MG/0.5ML ~~LOC~~ SOAJ: 4.5000 mg | SUBCUTANEOUS | 2 refills | Status: DC

## 2023-09-25 MED ORDER — FLUTICASONE FUROATE-VILANTEROL 200-25 MCG/ACT IN AEPB: 1.0000 | INHALATION_SPRAY | Freq: Every day | RESPIRATORY_TRACT | 2 refills | Status: DC

## 2023-09-25 MED ORDER — TRAMADOL HCL 50 MG PO TABS: 100.0000 mg | ORAL_TABLET | Freq: Two times a day (BID) | ORAL | 2 refills | Status: DC

## 2023-09-25 NOTE — Patient Instructions (Addendum)
 Simethicone Chewable Tablets What is this medication? SIMETHICONE (sye METH i kone) treats the symptoms of gas, such as fullness, pressure, and bloating. It works by making it easier to pass gas through your digestive tract and exit your body. This medicine may be used for other purposes; ask your health care provider or pharmacist if you have questions. COMMON BRAND NAME(S): Gas Relief, Gas-X, Gas-X Extra Strength, Genasyme, Mylanta Gas, Mytab Gas, Phazyme What should I tell my care team before I take this medication? They need to know if you have any of these conditions: Phenylketonuria An unusual or allergic reaction to simethicone, other medications, foods, dyes, or preservatives Pregnant or trying to get pregnant Breast-feeding How should I use this medication? Take this medication by mouth. Follow the directions on the prescription or product label. Chew or crush it completely before swallowing. Do not take your medication more often than directed. Talk to your care team about the use of this medication in children. While this medication may be used in children as young as 12 years for selected conditions, precautions do apply. Overdosage: If you think you have taken too much of this medicine contact a poison control center or emergency room at once. NOTE: This medicine is only for you. Do not share this medicine with others. What if I miss a dose? This does not apply. You will only use this medication as needed for gas pain. Do not use double or extra doses. What may interact with this medication? Interactions are not expected. This list may not describe all possible interactions. Give your health care provider a list of all the medicines, herbs, non-prescription drugs, or dietary supplements you use. Also tell them if you smoke, drink alcohol, or use illegal drugs. Some items may interact with your medicine. What should I watch for while using this medication? Tell your care team if  your symptoms get worse, or if you have severe pain, diarrhea, constipation, or blood in your stool. These could be signs of a more serious condition. What side effects may I notice from receiving this medication? Side effects that you should report to your care team as soon as possible: Allergic reactions--skin rash, itching, hives, swelling of the face, lips, tongue, or throat This list may not describe all possible side effects. Call your doctor for medical advice about side effects. You may report side effects to FDA at 1-800-FDA-1088. Where should I keep my medication? Keep out of the reach of children. Store at room temperature between 15 and 30 degrees C (59 and 86 degrees F). Keep container tightly closed. Throw away any unused medication after the expiration date. NOTE: This sheet is a summary. It may not cover all possible information. If you have questions about this medicine, talk to your doctor, pharmacist, or health care provider.  2024 Elsevier/Gold Standard (2021-06-22 00:00:00)

## 2023-09-25 NOTE — Telephone Encounter (Signed)
 Inhaler changed to [employer] per insurance.

## 2023-09-25 NOTE — Addendum Note (Signed)
 Addended by: Jannifer Rodney A on: 09/25/2023 01:10 PM   Modules accepted: Orders

## 2023-09-25 NOTE — Progress Notes (Signed)
 Subjective:    Patient ID: Jimmy Craig, male    DOB: 01/30/1951, 73 y.o.   MRN: [id number]  Chief Complaint  Patient presents with   Medical Management of Chronic Issues    6 mos ckup   Pt presents to the office today for chronic follow up. He has chronic back pain and has seen Neurosurgeon in the past. He had lumbar surgery on  11/22/20.  He had a MRI in 01/2020. He reports his leg pain is a pain of 3 out 10 when walking but resolves when resting. He is doing PT and followed by Pain Management.    He is followed by Ortho and had a steroid injection in left hip that did not help.    He has COPD with intermittent SOB and congestion. He recently got back from [place] and has a lot of smoke and fog that caused increased SOB.   Hypertension This is a chronic problem. The current episode started more than 1 year ago. The problem has been resolved since onset. The problem is controlled. Associated symptoms include blurred vision. Pertinent negatives include no malaise/fatigue, peripheral edema or shortness of breath. Risk factors for coronary artery disease include diabetes mellitus, dyslipidemia, male gender and sedentary lifestyle. The current treatment provides moderate improvement.  Gastroesophageal Reflux He complains of belching and heartburn. This is a chronic problem. The current episode started more than 1 year ago. The problem occurs occasionally. He has tried a PPI for the symptoms. The treatment provided moderate relief.  Hyperlipidemia This is a chronic problem. The current episode started more than 1 year ago. The problem is uncontrolled. Recent lipid tests were reviewed and are high. Pertinent negatives include no shortness of breath. Current antihyperlipidemic treatment includes statins. The current treatment provides moderate improvement of lipids. Risk factors for coronary artery disease include dyslipidemia, diabetes mellitus, hypertension, male sex and a sedentary lifestyle.   Diabetes He presents for his follow-up diabetic visit. He has type 2 diabetes mellitus. Associated symptoms include blurred vision and foot paresthesias. Diabetic complications include peripheral neuropathy. Risk factors for coronary artery disease include dyslipidemia, diabetes mellitus, hypertension, male sex and sedentary lifestyle. He is following a generally healthy diet. His overall blood glucose range is 130-140 mg/dl. Eye exam is current.  Back Pain This is a chronic problem. The current episode started more than 1 year ago. The problem occurs intermittently. The pain is present in the lumbar spine. The quality of the pain is described as aching. The pain is at a severity of 3/10. The pain is moderate. The symptoms are aggravated by standing and twisting. He has tried home exercises and analgesics for the symptoms. The treatment provided mild relief.  Arthritis Presents for follow-up visit. He complains of pain and stiffness. Affected locations include the right knee, left knee and left hip. His pain is at a severity of 3/10.  Benign Prostatic Hypertrophy This is a chronic problem. The current episode started more than 1 year ago. Irritative symptoms include nocturia (2-3). Past treatments include tamsulosin. The treatment provided mild relief.      Review of Systems  Constitutional:  Negative for malaise/fatigue.  Eyes:  Positive for blurred vision.  Respiratory:  Negative for shortness of breath.   Gastrointestinal:  Positive for heartburn.  Genitourinary:  Positive for nocturia (2-3).  Musculoskeletal:  Positive for back pain and stiffness.  All other systems reviewed and are negative.      Objective:   Physical Exam Vitals reviewed.  Constitutional:  General: He is not in acute distress.    Appearance: He is well-developed.  HENT:     Head: Normocephalic.     Right Ear: Tympanic membrane normal.     Left Ear: Tympanic membrane normal.  Eyes:     General:         Right eye: No discharge.        Left eye: No discharge.     Pupils: Pupils are equal, round, and reactive to light.  Neck:     Thyroid: No thyromegaly.  Cardiovascular:     Rate and Rhythm: Normal rate and regular rhythm.     Heart sounds: Normal heart sounds. No murmur heard. Pulmonary:     Effort: Pulmonary effort is normal. No respiratory distress.     Breath sounds: Wheezing present.  Abdominal:     General: Bowel sounds are normal. There is no distension.     Palpations: Abdomen is soft.     Tenderness: There is no abdominal tenderness.  Musculoskeletal:        General: No tenderness. Normal range of motion.     Cervical back: Normal range of motion and neck supple.  Skin:    General: Skin is warm and dry.     Findings: No erythema or rash.  Neurological:     Mental Status: He is alert and oriented to person, place, and time.     Cranial Nerves: No cranial nerve deficit.     Deep Tendon Reflexes: Reflexes are normal and symmetric.  Psychiatric:        Behavior: Behavior normal.        Thought Content: Thought content normal.        Judgment: Judgment normal.       BP 112/60   Pulse 100   Ht 5\' 5"  (1.651 m)   Wt 145 lb 3.2 oz (65.9 kg)   SpO2 97%   BMI 24.16 kg/m      Assessment & Plan:   Jimmy Craig comes in today with chief complaint of Medical Management of Chronic Issues (6 mos ckup)   Diagnosis and orders addressed:  1. Benign prostatic hyperplasia with nocturia - CMP14+EGFR  2. Chronic obstructive pulmonary disease, unspecified COPD type (HCC) - CMP14+EGFR  3. Type 2 diabetes mellitus with diabetic polyneuropathy, with long-term current use of insulin (HCC) (Primary) - Bayer DCA Hb A1c Waived - CMP14+EGFR - Dulaglutide (TRULICITY) 4.5 MG/0.5ML SOAJ; Inject 4.5 mg into the skin once a week.  Dispense: 4 mL; Refill: 2  4. GERD without esophagitis - CMP14+EGFR  5. Hyperlipidemia associated with type 2 diabetes mellitus (HCC) -  CMP14+EGFR  6. Hypertension associated with diabetes (HCC) - CMP14+EGFR  7. Chronic low back pain with left-sided sciatica, unspecified back pain laterality - CMP14+EGFR - traMADol (ULTRAM) 50 MG tablet; Take 2 tablets (100 mg total) by mouth 2 (two) times daily.  Dispense: 120 tablet; Refill: 2  8. Primary osteoarthritis involving multiple joints - CMP14+EGFR - traMADol (ULTRAM) 50 MG tablet; Take 2 tablets (100 mg total) by mouth 2 (two) times daily.  Dispense: 120 tablet; Refill: 2    Labs pending Continue current medications  Keep chronic follow up Health Maintenance reviewed Diet and exercise encouraged  Follow up plan: 3 months    Jannifer Rodney, FNP

## 2023-09-26 LAB — BAYER DCA HB A1C WAIVED: HB A1C (BAYER DCA - WAIVED): 7.4 % — ABNORMAL HIGH (ref 4.8–5.6)

## 2023-09-26 LAB — CMP14+EGFR
ALT: 30 [IU]/L (ref 0–44)
AST: 29 [IU]/L (ref 0–40)
Albumin: 4.2 g/dL (ref 3.9–4.9)
Alkaline Phosphatase: 109 [IU]/L (ref 44–121)
BUN/Creatinine Ratio: 28 — ABNORMAL HIGH (ref 10–24)
BUN: 23 mg/dL (ref 8–27)
Bilirubin Total: <0.2 mg/dL (ref 0.0–1.2)
CO2: 26 mmol/L (ref 20–29)
Calcium: 9.4 mg/dL (ref 8.6–10.2)
Chloride: 104 mmol/L (ref 96–106)
Creatinine, Ser: 0.81 mg/dL (ref 0.76–1.27)
Globulin, Total: 2.9 g/dL (ref 1.5–4.5)
Glucose: 117 mg/dL — ABNORMAL HIGH (ref 70–99)
Potassium: 5 mmol/L (ref 3.5–5.2)
Sodium: 141 mmol/L (ref 134–144)
Total Protein: 7.1 g/dL (ref 6.0–8.5)
eGFR: 95 mL/min/{1.73_m2} (ref >59)

## 2023-10-01 ENCOUNTER — Encounter: Payer: Self-pay | Admitting: Family Medicine

## 2023-10-29 ENCOUNTER — Other Ambulatory Visit: Payer: Self-pay | Admitting: Family

## 2023-10-29 DIAGNOSIS — E1142 Type 2 diabetes mellitus with diabetic polyneuropathy: Secondary | ICD-10-CM

## 2023-11-01 ENCOUNTER — Other Ambulatory Visit: Payer: Self-pay | Admitting: Family

## 2023-11-01 DIAGNOSIS — M150 Primary generalized (osteo)arthritis: Secondary | ICD-10-CM

## 2023-11-01 DIAGNOSIS — M25559 Pain in unspecified hip: Secondary | ICD-10-CM

## 2023-11-07 ENCOUNTER — Encounter: Payer: Self-pay | Admitting: Family

## 2023-11-07 ENCOUNTER — Ambulatory Visit (INDEPENDENT_AMBULATORY_CARE_PROVIDER_SITE_OTHER): Admitting: Family

## 2023-11-07 VITALS — BP 138/75 | HR 84 | Temp 97.5°F | Ht 65.0 in | Wt 138.0 lb

## 2023-11-07 DIAGNOSIS — E1121 Type 2 diabetes mellitus with diabetic nephropathy: Secondary | ICD-10-CM | POA: Insufficient documentation

## 2023-11-07 DIAGNOSIS — Z794 Long term (current) use of insulin: Secondary | ICD-10-CM

## 2023-11-07 DIAGNOSIS — E1142 Type 2 diabetes mellitus with diabetic polyneuropathy: Secondary | ICD-10-CM | POA: Diagnosis not present

## 2023-11-07 DIAGNOSIS — G8929 Other chronic pain: Secondary | ICD-10-CM

## 2023-11-07 DIAGNOSIS — M5442 Lumbago with sciatica, left side: Secondary | ICD-10-CM | POA: Diagnosis not present

## 2023-11-07 DIAGNOSIS — J209 Acute bronchitis, unspecified: Secondary | ICD-10-CM

## 2023-11-07 DIAGNOSIS — K219 Gastro-esophageal reflux disease without esophagitis: Secondary | ICD-10-CM

## 2023-11-07 MED ORDER — LANCET DEVICE MISC: 1.0000 | Freq: Three times a day (TID) | 0 refills | Status: AC

## 2023-11-07 MED ORDER — LANCETS MISC. MISC: 1.0000 | Freq: Three times a day (TID) | 0 refills | Status: AC

## 2023-11-07 MED ORDER — PREDNISONE 20 MG PO TABS: 40.0000 mg | ORAL_TABLET | Freq: Every day | ORAL | 0 refills | Status: AC

## 2023-11-07 MED ORDER — PANTOPRAZOLE SODIUM 40 MG PO TBEC: 40.0000 mg | DELAYED_RELEASE_TABLET | Freq: Every day | ORAL | 0 refills | Status: DC

## 2023-11-07 MED ORDER — BLOOD GLUCOSE MONITORING SUPPL DEVI: 1.0000 | Freq: Three times a day (TID) | 0 refills | Status: AC

## 2023-11-07 MED ORDER — PANTOPRAZOLE SODIUM 40 MG PO TBEC: 40.0000 mg | DELAYED_RELEASE_TABLET | Freq: Every day | ORAL | 1 refills | Status: DC

## 2023-11-07 MED ORDER — PREGABALIN 150 MG PO CAPS: 150.0000 mg | ORAL_CAPSULE | Freq: Two times a day (BID) | ORAL | 2 refills | Status: DC

## 2023-11-07 MED ORDER — PANTOPRAZOLE SODIUM 40 MG PO TBEC
40.0000 mg | DELAYED_RELEASE_TABLET | Freq: Two times a day (BID) | ORAL | 1 refills | Status: DC
Start: 2023-11-07 — End: 2023-12-23

## 2023-11-07 MED ORDER — BLOOD GLUCOSE TEST VI STRP: 1.0000 | ORAL_STRIP | Freq: Three times a day (TID) | 0 refills | Status: AC

## 2023-11-07 NOTE — Patient Instructions (Signed)
 GERD in Adults: What to Know  Gastroesophageal reflux (GER) is when acid from your stomach flows up into your esophagus. Your esophagus is the part of your body that moves food from your mouth to your stomach. Normally, food goes down and stays in your stomach to be digested. But with GER, food and stomach acid may go back up. You may have a disease called gastroesophageal reflux disease (GERD) if the reflux: Happens often. Causes very bad symptoms. Makes your esophagus sore and swollen. Over time, GERD can make small holes called ulcers in the lining of your esophagus. What are the causes? GERD is caused by a problem with the muscle between your esophagus and stomach. This muscle is called the lower esophageal sphincter (LES). When it's weak or not normal, it doesn't close like it should. This means food and stomach acid can go back up into your esophagus. The muscle can be weak if: You smoke or use products with tobacco in them. You're pregnant. You have a type of hernia called a hiatal hernia. You eat certain foods and drinks. These include: Alcohol. Coffee. Chocolate. Onions. Peppermint. What increases the risk? Being overweight. Having a disease that affects your connective tissue. Taking NSAIDs, such as ibuprofen. What are the signs or symptoms? Heartburn. Trouble swallowing. Pain when you swallow. The feeling of having a lump in your throat. A bitter taste in your mouth. Bad breath. Having an upset or bloated stomach. Burping. Chest pain. Other conditions can also cause chest pain. Make sure you see your health care provider if you have chest pain. Wheezing. This is when you make high-pitched whistling sounds when you breathe, most often when you breathe out. A long-term cough or a cough at night. How is this diagnosed? GERD may be diagnosed based on your medical history and a physical exam. You may also have tests. These may include: An endoscopy. This test looks at your  stomach and esophagus with a small camera. A barium swallow test. This shows the shape and size of your esophagus and how well it's working. Tests of your esophagus to check for: Acid levels. Pressure. How is this treated? Treatment may depend on how bad your symptoms are. It may include: Changes to your diet and daily life. Medicines. Surgery. Follow these instructions at home: Eating and drinking Follow an eating plan as told by your provider. You may need to avoid certain foods and drinks. These may include: Coffee and tea, with or without caffeine. Alcohol. Energy drinks and sports drinks. Fizzy drinks or sodas. Chocolate and cocoa. Peppermint and mint flavorings. Garlic and onions. Horseradish. Spicy and acidic foods. These include: Peppers. Chili powder and curry powder. Vinegar. Hot sauces and BBQ sauce. Citrus fruits and juices. These include: Oranges. Lemons. Limes. Tomato-based foods. These include: Red sauce and pizza with red sauce. Chili. Salsa. Fried and fatty foods. These include: Donuts. [place] fries. Potato chips. High-fat dressings. High-fat meats. These include: Hot dogs and sausage. Rib eye steak. Ham and bacon. High-fat dairy items. These include: Whole milk. Butter. Cream cheese. Eat small meals often. Avoid eating big meals. Avoid drinking lots of liquid with your meals. Try not to eat meals during the 2-3 hours before bedtime. Try not to lie down right after you eat. Do not exercise right after you eat. Lifestyle  If you're overweight, lose an amount of weight that's healthy for you. Ask your provider about a safe weight loss goal. Do not smoke, vape, or use nicotine or tobacco. Wear  loose clothes. Do not wear things that are tight around your waist. When you sleep, try: Raising the head of your bed about 6 inches (15 cm). You can use a wedge to do this. Lying down on your left side. Try to lower your stress. If you need help doing  this, ask your provider. General instructions Take your medicines only as told. Do not take aspirin or ibuprofen unless you're told to. Watch for any changes in your symptoms. Do not bend over if it makes your symptoms worse. Contact a health care provider if: You have new symptoms. You have trouble: Drinking. Swallowing. Eating. It hurts to swallow. You have wheezing. You have a cough that won't go away. Your voice is hoarse. Your symptoms don't get better with treatment. Get help right away if: You have pain all of a sudden in your: Arm. Neck. Jaw. Teeth. Back. You feel sweaty, dizzy, or light-headed all of a sudden. You faint. You have chest pain or shortness of breath. You vomit and the vomit is: Green, yellow, or black. Looks like blood or coffee grounds. Your poop is red, bloody, or black. These symptoms may be an emergency. Call 911 right away. Do not wait to see if the symptoms will go away. Do not drive yourself to the hospital. This information is not intended to replace advice given to you by your health care provider. Make sure you discuss any questions you have with your health care provider.   Acute Bronchitis, Adult  Acute bronchitis is sudden inflammation of the main airways (bronchi) that come off the windpipe (trachea) in the lungs. The swelling causes the airways to get smaller and make more mucus than normal. This can make it hard to breathe and can cause coughing or noisy breathing (wheezing). Acute bronchitis may last several weeks. The cough may last longer. Allergies, asthma, and exposure to smoke may make the condition worse. What are the causes? This condition can be caused by germs and by substances that irritate the lungs, including: Cold and flu viruses. The most common cause of this condition is the virus that causes the common cold. Bacteria. This is less common. Breathing in substances that irritate the lungs, including: Smoke from  cigarettes and other forms of tobacco. Dust and pollen. Fumes from household cleaning products, gases, or burned fuel. Indoor or outdoor air pollution. What increases the risk? The following factors may make you more likely to develop this condition: A weak body's defense system, also called the immune system. A condition that affects your lungs and breathing, such as asthma. What are the signs or symptoms? Common symptoms of this condition include: Coughing. This may bring up clear, yellow, or green mucus from your lungs (sputum). Wheezing. Runny or stuffy nose. Having too much mucus in your lungs (chest congestion). Shortness of breath. Aches and pains, including sore throat or chest. How is this diagnosed? This condition is usually diagnosed based on: Your symptoms and medical history. A physical exam. You may also have other tests, including tests to rule out other conditions, such as pneumonia. These tests include: A test of lung function. Test of a mucus sample to look for the presence of bacteria. Tests to check the oxygen level in your blood. Blood tests. Chest X-ray. How is this treated? Most cases of acute bronchitis clear up over time without treatment. Your health care provider may recommend: Drinking more fluids to help thin your mucus so it is easier to cough up. Taking inhaled medicine (  inhaler) to improve air flow in and out of your lungs. Using a vaporizer or a humidifier. These are machines that add water to the air to help you breathe better. Taking a medicine that thins mucus and clears congestion (expectorant). Taking a medicine that prevents or stops coughing (cough suppressant). It is not common to take an antibiotic medicine for this condition. Follow these instructions at home:  Take over-the-counter and prescription medicines only as told by your health care provider. Use an inhaler, vaporizer, or humidifier as told by your health care provider. Take  two teaspoons (10 mL) of honey at bedtime to lessen coughing at night. Drink enough fluid to keep your urine pale yellow. Do not use any products that contain nicotine or tobacco. These products include cigarettes, chewing tobacco, and vaping devices, such as e-cigarettes. If you need help quitting, ask your health care provider. Get plenty of rest. Return to your normal activities as told by your health care provider. Ask your health care provider what activities are safe for you. Keep all follow-up visits. This is important. How is this prevented? To lower your risk of getting this condition again: Wash your hands often with soap and water for at least 20 seconds. If soap and water are not available, use hand sanitizer. Avoid contact with people who have cold symptoms. Try not to touch your mouth, nose, or eyes with your hands. Avoid breathing in smoke or chemical fumes. Breathing smoke or chemical fumes will make your condition worse. Get the flu shot every year. Contact a health care provider if: Your symptoms do not improve after 2 weeks. You have trouble coughing up the mucus. Your cough keeps you awake at night. You have a fever. Get help right away if you: Cough up blood. Feel pain in your chest. Have severe shortness of breath. Faint or keep feeling like you are going to faint. Have a severe headache. Have a fever or chills that get worse. These symptoms may represent a serious problem that is an emergency. Do not wait to see if the symptoms will go away. Get medical help right away. Call your local emergency services (911 in the [place] Do not drive yourself to the hospital. Summary Acute bronchitis is inflammation of the main airways (bronchi) that come off the windpipe (trachea) in the lungs. The swelling causes the airways to get smaller and make more mucus than normal. Drinking more fluids can help thin your mucus so it is easier to cough up. Take over-the-counter and  prescription medicines only as told by your health care provider. Do not use any products that contain nicotine or tobacco. These products include cigarettes, chewing tobacco, and vaping devices, such as e-cigarettes. If you need help quitting, ask your health care provider. Contact a health care provider if your symptoms do not improve after 2 weeks. This information is not intended to replace advice given to you by your health care provider. Make sure you discuss any questions you have with your health care provider. Document Revised: 11/01/2021 Document Reviewed: 11/22/2020 Elsevier Patient Education  2024 [employer] Inc. Document Revised: 06/03/2023 Document Reviewed: 12/18/2022 Elsevier Patient Education  2024 [employer].

## 2023-11-07 NOTE — Progress Notes (Signed)
 Subjective:    Patient ID: Jimmy Craig, male    DOB: 1951-03-11, 73 y.o.   MRN: [id number]  Chief Complaint  Patient presents with   Cough    Cought x 2 weeks. Runny nose has been going on for a long time    Leg Pain    Long time    PT presents to the office today with cough for the last two weeks.   He is complaining of bilateral leg pain that is chronic. He takes Lyrica 150 mg daily that helps. Reports his pain is a 7 out 10 without medications, when he is taking Lyrica his pain is a 2-3 out 10.  Cough This is a new problem. The current episode started 1 to 4 weeks ago. The problem has been gradually worsening. The problem occurs every few minutes. The cough is Productive of sputum. Associated symptoms include heartburn, myalgias, nasal congestion, a sore throat and shortness of breath. Pertinent negatives include no chills, ear congestion, ear pain, fever or headaches.  Leg Pain   Gastroesophageal Reflux He complains of belching, choking, coughing, dysphagia, early satiety, globus sensation, heartburn and a sore throat. This is a chronic problem. The current episode started more than 1 year ago. The problem occurs occasionally.  Diabetes He presents for his follow-up diabetic visit. He has type 2 diabetes mellitus. Pertinent negatives for hypoglycemia include no headaches. Pertinent negatives for diabetes include no blurred vision and no foot paresthesias.      Review of Systems  Constitutional:  Negative for chills and fever.  HENT:  Positive for sore throat. Negative for ear pain.   Eyes:  Negative for blurred vision.  Respiratory:  Positive for cough, choking and shortness of breath.   Gastrointestinal:  Positive for dysphagia and heartburn.  Musculoskeletal:  Positive for myalgias.  Neurological:  Negative for headaches.  All other systems reviewed and are negative.   Social History   Socioeconomic History   Marital status: Married    Spouse name: Not on file    Number of children: Not on file   Years of education: Not on file   Highest education level: Master's degree (e.g., MA, MS, MEng, MEd, MSW, MBA)  Occupational History   Not on file  Tobacco Use   Smoking status: Never   Smokeless tobacco: Never  Vaping Use   Vaping status: Never Used  Substance and Sexual Activity   Alcohol use: No   Drug use: No   Sexual activity: Yes    Birth control/protection: None  Other Topics Concern   Not on file  Social History Narrative   Not on file   Social Drivers of Health   Financial Resource Strain: Medium Risk (12/03/2022)   Overall Financial Resource Strain (CARDIA)    Difficulty of Paying Living Expenses: Somewhat hard  Food Insecurity: No Food Insecurity (12/03/2022)   Hunger Vital Sign    Worried About Running Out of Food in the Last Year: Never true    Ran Out of Food in the Last Year: Never true  Transportation Needs: No Transportation Needs (12/03/2022)   PRAPARE - Administrator, Civil Service (Medical): No    Lack of Transportation (Non-Medical): No  Physical Activity: Insufficiently Active (12/03/2022)   Exercise Vital Sign    Days of Exercise per Week: 3 days    Minutes of Exercise per Session: 10 min  Stress: Stress Concern Present (12/03/2022)   [employer] of Occupational Health - Occupational Stress Questionnaire  Feeling of Stress : To some extent  Social Connections: Moderately Integrated (12/03/2022)   Social Connection and Isolation Panel [NHANES]    Frequency of Communication with Friends and Family: Three times a week    Frequency of Social Gatherings with Friends and Family: Three times a week    Attends Religious Services: 1 to 4 times per year    Active Member of Clubs or Organizations: No    Attends Engineer, structural: Not on file    Marital Status: Married   Family History  Problem Relation Age of Onset   Diabetes Mother         Objective:   Physical Exam Vitals reviewed.   Constitutional:      General: He is not in acute distress.    Appearance: He is well-developed.  HENT:     Head: Normocephalic.     Right Ear: Tympanic membrane and external ear normal.     Left Ear: Tympanic membrane and external ear normal.  Eyes:     General:        Right eye: No discharge.        Left eye: No discharge.     Pupils: Pupils are equal, round, and reactive to light.  Neck:     Thyroid: No thyromegaly.  Cardiovascular:     Rate and Rhythm: Normal rate and regular rhythm.     Heart sounds: Normal heart sounds. No murmur heard. Pulmonary:     Effort: Pulmonary effort is normal. No respiratory distress.     Breath sounds: Normal breath sounds. No wheezing.     Comments: Dry nonproductive cough Abdominal:     General: Bowel sounds are normal. There is no distension.     Palpations: Abdomen is soft.     Tenderness: There is no abdominal tenderness.  Musculoskeletal:        General: No tenderness. Normal range of motion.     Cervical back: Normal range of motion and neck supple.  Skin:    General: Skin is warm and dry.     Findings: No erythema or rash.  Neurological:     Mental Status: He is alert and oriented to person, place, and time.     Cranial Nerves: No cranial nerve deficit.     Deep Tendon Reflexes: Reflexes are normal and symmetric.  Psychiatric:        Behavior: Behavior normal.        Thought Content: Thought content normal.        Judgment: Judgment normal.       BP 138/75   Pulse 84   Temp (!) 97.5 F (36.4 C) (Temporal)   Ht 5\' 5"  (1.651 m)   Wt 138 lb (62.6 kg)   BMI 22.96 kg/m      Assessment & Plan:  Jimmy Craig comes in today with chief complaint of Cough (Cought x 2 weeks. Runny nose has been going on for a long time ) and Leg Pain (Long time )   Diagnosis and orders addressed:  1. GERD without esophagitis (Primary) Will increase Protonix to 40 mg BID Referral to GI pending  -Diet discussed- Avoid fried, spicy, citrus  foods, caffeine and alcohol -Do not eat 2-3 hours before bedtime -Encouraged small frequent meals -Avoid NSAID's - Ambulatory referral to Gastroenterology - pantoprazole (PROTONIX) 40 MG tablet; Take 1 tablet (40 mg total) by mouth 2 (two) times daily.  Dispense: 180 tablet; Refill: 1  2. Chronic low back pain with  left-sided sciatica, unspecified back pain laterality - pregabalin (LYRICA) 150 MG capsule; Take 1 capsule (150 mg total) by mouth 2 (two) times daily.  Dispense: 180 capsule; Refill: 2  3. Diabetic nephropathy associated with type 2 diabetes mellitus (HCC) - pregabalin (LYRICA) 150 MG capsule; Take 1 capsule (150 mg total) by mouth 2 (two) times daily.  Dispense: 180 capsule; Refill: 2  4. Type 2 diabetes mellitus with diabetic polyneuropathy, with long-term current use of insulin (HCC)  - Blood Glucose Monitoring Suppl DEVI; 1 each by Does not apply route in the morning, at noon, and at bedtime. May substitute to any manufacturer covered by patient's insurance.  Dispense: 1 each; Refill: 0 - Glucose Blood (BLOOD GLUCOSE TEST STRIPS) STRP; 1 each by In Vitro route in the morning, at noon, and at bedtime. May substitute to any manufacturer covered by patient's insurance.  Dispense: 100 strip; Refill: 0 - Lancet Device MISC; 1 each by Does not apply route in the morning, at noon, and at bedtime. May substitute to any manufacturer covered by patient's insurance.  Dispense: 1 each; Refill: 0 - Lancets Misc. MISC; 1 each by Does not apply route in the morning, at noon, and at bedtime. May substitute to any manufacturer covered by patient's insurance.  Dispense: 100 each; Refill: 0  5. Acute bronchitis, unspecified organism - Take meds as prescribed - Use a cool mist humidifier  -Use saline nose sprays frequently -Force fluids -For any cough or congestion  Use plain Mucinex- regular strength or max strength is fine -For fever or aces or pains- take tylenol or ibuprofen. -Throat  lozenges if help - predniSONE (DELTASONE) 20 MG tablet; Take 2 tablets (40 mg total) by mouth daily with breakfast for 5 days.  Dispense: 10 tablet; Refill: 0   Will increase Protonix to 40 mg BID from daily GI referral pending  Glucose meter rx ordered Prednisone given today, strict low carb diet Continue current medications  Keep follow up   Jannifer Rodney, FNP

## 2023-11-26 ENCOUNTER — Encounter (INDEPENDENT_AMBULATORY_CARE_PROVIDER_SITE_OTHER): Payer: Self-pay | Admitting: *Deleted

## 2023-12-02 ENCOUNTER — Encounter (INDEPENDENT_AMBULATORY_CARE_PROVIDER_SITE_OTHER): Payer: Self-pay | Admitting: *Deleted

## 2023-12-03 ENCOUNTER — Other Ambulatory Visit: Payer: Self-pay | Admitting: Family

## 2023-12-20 ENCOUNTER — Other Ambulatory Visit: Payer: Self-pay | Admitting: Family

## 2023-12-20 DIAGNOSIS — E1142 Type 2 diabetes mellitus with diabetic polyneuropathy: Secondary | ICD-10-CM

## 2023-12-23 ENCOUNTER — Ambulatory Visit (INDEPENDENT_AMBULATORY_CARE_PROVIDER_SITE_OTHER): Payer: Medicare Other | Admitting: Family

## 2023-12-23 ENCOUNTER — Encounter: Payer: Self-pay | Admitting: Family

## 2023-12-23 VITALS — BP 136/77 | HR 95 | Temp 97.5°F | Ht 65.0 in | Wt 145.2 lb

## 2023-12-23 DIAGNOSIS — M5442 Lumbago with sciatica, left side: Secondary | ICD-10-CM

## 2023-12-23 DIAGNOSIS — E1121 Type 2 diabetes mellitus with diabetic nephropathy: Secondary | ICD-10-CM

## 2023-12-23 DIAGNOSIS — N401 Enlarged prostate with lower urinary tract symptoms: Secondary | ICD-10-CM

## 2023-12-23 DIAGNOSIS — M48061 Spinal stenosis, lumbar region without neurogenic claudication: Secondary | ICD-10-CM

## 2023-12-23 DIAGNOSIS — K219 Gastro-esophageal reflux disease without esophagitis: Secondary | ICD-10-CM

## 2023-12-23 DIAGNOSIS — M150 Primary generalized (osteo)arthritis: Secondary | ICD-10-CM | POA: Diagnosis not present

## 2023-12-23 DIAGNOSIS — E1169 Type 2 diabetes mellitus with other specified complication: Secondary | ICD-10-CM

## 2023-12-23 DIAGNOSIS — J449 Chronic obstructive pulmonary disease, unspecified: Secondary | ICD-10-CM

## 2023-12-23 DIAGNOSIS — G8929 Other chronic pain: Secondary | ICD-10-CM

## 2023-12-23 DIAGNOSIS — E1142 Type 2 diabetes mellitus with diabetic polyneuropathy: Secondary | ICD-10-CM | POA: Diagnosis not present

## 2023-12-23 DIAGNOSIS — Z0001 Encounter for general adult medical examination with abnormal findings: Secondary | ICD-10-CM

## 2023-12-23 DIAGNOSIS — Z794 Long term (current) use of insulin: Secondary | ICD-10-CM

## 2023-12-23 DIAGNOSIS — E1159 Type 2 diabetes mellitus with other circulatory complications: Secondary | ICD-10-CM

## 2023-12-23 DIAGNOSIS — Z0000 Encounter for general adult medical examination without abnormal findings: Secondary | ICD-10-CM

## 2023-12-23 LAB — BAYER DCA HB A1C WAIVED: HB A1C (BAYER DCA - WAIVED): 7.9 % — ABNORMAL HIGH (ref 4.8–5.6)

## 2023-12-23 MED ORDER — PREGABALIN 150 MG PO CAPS
150.0000 mg | ORAL_CAPSULE | Freq: Two times a day (BID) | ORAL | 2 refills | Status: DC
Start: 2023-12-23 — End: 2024-03-26

## 2023-12-23 MED ORDER — PANTOPRAZOLE SODIUM 40 MG PO TBEC: 40.0000 mg | DELAYED_RELEASE_TABLET | Freq: Two times a day (BID) | ORAL | 1 refills | Status: DC

## 2023-12-23 MED ORDER — TRAMADOL HCL 50 MG PO TABS: 100.0000 mg | ORAL_TABLET | Freq: Two times a day (BID) | ORAL | 2 refills | Status: DC

## 2023-12-23 MED ORDER — ATORVASTATIN CALCIUM 40 MG PO TABS: 40.0000 mg | ORAL_TABLET | Freq: Every day | ORAL | 1 refills | Status: DC

## 2023-12-23 NOTE — Progress Notes (Signed)
 Subjective:    Patient ID: Jimmy Craig, male    DOB: 1950-10-10, 73 y.o.   MRN: [id number]  Chief Complaint  Patient presents with   Medical Management of Chronic Issues   Foot Swelling    Wife states she sees it in face as well. Been going 3-4 days. Been doing heavy lifting with work at the house    Nasal Congestion    The oill you gave helped, but know it does. With a smell to it    Leg Pain    Been going on    Pt presents to the office today for chronic follow up.   He has chronic back pain and has seen Neurosurgeon in the past. He had lumbar surgery on  11/22/20.  He had a MRI in 01/2020. He reports his leg pain is a pain of 3-6 out 10 when walking but resolves when resting. He completed PT. He takes Lyrica  150 mg BID.    He is followed by Ortho and had a steroid injection in left hip that did not help.    He has COPD with intermittent SOB and congestion. He recently got back from [place] and has a lot of smoke and fog that caused increased SOB.   Hypertension This is a chronic problem. The current episode started more than 1 year ago. The problem has been resolved since onset. The problem is controlled. Associated symptoms include blurred vision, malaise/fatigue and peripheral edema. Pertinent negatives include no shortness of breath. Risk factors for coronary artery disease include diabetes mellitus, dyslipidemia, male gender and sedentary lifestyle. The current treatment provides moderate improvement.  Gastroesophageal Reflux He complains of belching and heartburn. This is a chronic problem. The current episode started more than 1 year ago. The problem occurs occasionally. The symptoms are aggravated by certain foods. He has tried a PPI for the symptoms. The treatment provided moderate relief.  Hyperlipidemia This is a chronic problem. The current episode started more than 1 year ago. The problem is uncontrolled. Recent lipid tests were reviewed and are high. Pertinent  negatives include no shortness of breath. Current antihyperlipidemic treatment includes statins. The current treatment provides moderate improvement of lipids. Risk factors for coronary artery disease include dyslipidemia, diabetes mellitus, hypertension, male sex and a sedentary lifestyle.  Diabetes He presents for his follow-up diabetic visit. He has type 2 diabetes mellitus. Associated symptoms include blurred vision and foot paresthesias. Diabetic complications include peripheral neuropathy. Risk factors for coronary artery disease include dyslipidemia, diabetes mellitus, hypertension, male sex and sedentary lifestyle. He is following a generally healthy diet. His overall blood glucose range is 110-130 mg/dl. Eye exam is current.  Back Pain This is a chronic problem. The current episode started more than 1 year ago. The problem occurs intermittently. The pain is present in the lumbar spine. The quality of the pain is described as aching. The pain is at a severity of 3/10. The pain is moderate. The symptoms are aggravated by standing and twisting. He has tried home exercises and analgesics for the symptoms. The treatment provided mild relief.  Arthritis Presents for follow-up visit. He complains of pain and stiffness. Affected locations include the right knee, left knee and left hip. His pain is at a severity of 5/10 (when walking).  Benign Prostatic Hypertrophy This is a chronic problem. The current episode started more than 1 year ago. Irritative symptoms include nocturia (2-3). Past treatments include tamsulosin . The treatment provided mild relief.    Current opioids rx- Ultram   50 mg and Lyrica  # meds rx- 120, 60 Effectiveness of current meds-stable Adverse reactions from pain meds-none Morphine equivalent- 20  Pill count performed-No Last drug screen - 03/04/23 ( high risk q30m, moderate risk q67m, low risk yearly ) Urine drug screen today- Yes Was the [facility] reviewed- yes  If yes were  their any concerning findings? - none  Pain contract signed on:03/04/23    Review of Systems  Constitutional:  Positive for malaise/fatigue.  Eyes:  Positive for blurred vision.  Respiratory:  Negative for shortness of breath.   Gastrointestinal:  Positive for heartburn.  Genitourinary:  Positive for nocturia (2-3).  Musculoskeletal:  Positive for back pain and stiffness.  All other systems reviewed and are negative.  Family History  Problem Relation Age of Onset   Diabetes Mother    Social History   Socioeconomic History   Marital status: Married    Spouse name: Not on file   Number of children: Not on file   Years of education: Not on file   Highest education level: Master's degree (e.g., MA, MS, MEng, MEd, MSW, MBA)  Occupational History   Not on file  Tobacco Use   Smoking status: Never   Smokeless tobacco: Never  Vaping Use   Vaping status: Never Used  Substance and Sexual Activity   Alcohol use: No   Drug use: No   Sexual activity: Yes    Birth control/protection: None  Other Topics Concern   Not on file  Social History Narrative   Not on file   Social Drivers of Health   Financial Resource Strain: Medium Risk (12/03/2022)   Overall Financial Resource Strain (CARDIA)    Difficulty of Paying Living Expenses: Somewhat hard  Food Insecurity: No Food Insecurity (12/03/2022)   Hunger Vital Sign    Worried About Running Out of Food in the Last Year: Never true    Ran Out of Food in the Last Year: Never true  Transportation Needs: No Transportation Needs (12/03/2022)   PRAPARE - Administrator, Civil Service (Medical): No    Lack of Transportation (Non-Medical): No  Physical Activity: Insufficiently Active (12/03/2022)   Exercise Vital Sign    Days of Exercise per Week: 3 days    Minutes of Exercise per Session: 10 min  Stress: Stress Concern Present (12/03/2022)   [employer] of Occupational Health - Occupational Stress Questionnaire     Feeling of Stress : To some extent  Social Connections: Moderately Integrated (12/03/2022)   Social Connection and Isolation Panel [NHANES]    Frequency of Communication with Friends and Family: Three times a week    Frequency of Social Gatherings with Friends and Family: Three times a week    Attends Religious Services: 1 to 4 times per year    Active Member of Clubs or Organizations: No    Attends Banker Meetings: Not on file    Marital Status: Married       Objective:   Physical Exam Vitals reviewed.  Constitutional:      General: He is not in acute distress.    Appearance: He is well-developed.  HENT:     Head: Normocephalic.     Right Ear: Tympanic membrane normal.     Left Ear: Tympanic membrane normal.  Eyes:     General:        Right eye: No discharge.        Left eye: No discharge.     Pupils: Pupils are  equal, round, and reactive to light.  Neck:     Thyroid: No thyromegaly.  Cardiovascular:     Rate and Rhythm: Normal rate and regular rhythm.     Heart sounds: Normal heart sounds. No murmur heard. Pulmonary:     Effort: Pulmonary effort is normal. No respiratory distress.     Breath sounds: No wheezing.  Abdominal:     General: Bowel sounds are normal. There is no distension.     Palpations: Abdomen is soft.     Tenderness: There is no abdominal tenderness.  Musculoskeletal:        General: No tenderness. Normal range of motion.     Cervical back: Normal range of motion and neck supple.     Right lower leg: Edema (trace) present.  Skin:    General: Skin is warm and dry.     Findings: No erythema or rash.  Neurological:     Mental Status: He is alert and oriented to person, place, and time.     Cranial Nerves: No cranial nerve deficit.     Deep Tendon Reflexes: Reflexes are normal and symmetric.  Psychiatric:        Behavior: Behavior normal.        Thought Content: Thought content normal.        Judgment: Judgment normal.       BP  136/77   Pulse 95   Temp (!) 97.5 F (36.4 C) (Temporal)   Ht 5\' 5"  (1.651 m)   Wt 145 lb 3.2 oz (65.9 kg)   SpO2 94%   BMI 24.16 kg/m      Assessment & Plan:   Husayn Reim comes in today with chief complaint of Medical Management of Chronic Issues, Foot Swelling (Wife states she sees it in face as well. Been going 3-4 days. Been doing heavy lifting with work at the house ), Nasal Congestion (The oill you gave helped, but know it does. With a smell to it ), and Leg Pain (Been going on )   Diagnosis and orders addressed:  1. Chronic low back pain with left-sided sciatica, unspecified back pain laterality - traMADol  (ULTRAM ) 50 MG tablet; Take 2 tablets (100 mg total) by mouth 2 (two) times daily.  Dispense: 120 tablet; Refill: 2 - CMP14+EGFR - CBC with Differential/Platelet - pregabalin  (LYRICA ) 150 MG capsule; Take 1 capsule (150 mg total) by mouth 2 (two) times daily.  Dispense: 180 capsule; Refill: 2  2. Primary osteoarthritis involving multiple joints - traMADol  (ULTRAM ) 50 MG tablet; Take 2 tablets (100 mg total) by mouth 2 (two) times daily.  Dispense: 120 tablet; Refill: 2 - CMP14+EGFR - CBC with Differential/Platelet  3. Type 2 diabetes mellitus with diabetic polyneuropathy, with long-term current use of insulin  (HCC) - CMP14+EGFR - CBC with Differential/Platelet - Bayer DCA Hb A1c Waived - Vitamin B12 - TSH  4. Hyperlipidemia associated with type 2 diabetes mellitus (HCC) - CMP14+EGFR - CBC with Differential/Platelet - TSH - atorvastatin  (LIPITOR) 40 MG tablet; Take 1 tablet (40 mg total) by mouth daily.  Dispense: 90 tablet; Refill: 1  5. GERD without esophagitis - CMP14+EGFR - CBC with Differential/Platelet - pantoprazole  (PROTONIX ) 40 MG tablet; Take 1 tablet (40 mg total) by mouth 2 (two) times daily.  Dispense: 180 tablet; Refill: 1  6. Hypertension associated with diabetes (HCC) - CMP14+EGFR - CBC with Differential/Platelet - TSH  7. Spinal  stenosis of lumbar region, unspecified whether neurogenic claudication present  - CMP14+EGFR - CBC with Differential/Platelet  8. Chronic obstructive pulmonary disease, unspecified COPD type (HCC)  - CMP14+EGFR - CBC with Differential/Platelet  9. Diabetic nephropathy associated with type 2 diabetes mellitus (HCC) - CMP14+EGFR - CBC with Differential/Platelet - Bayer DCA Hb A1c Waived - pregabalin  (LYRICA ) 150 MG capsule; Take 1 capsule (150 mg total) by mouth 2 (two) times daily.  Dispense: 180 capsule; Refill: 2  10. Annual physical exam (Primary) - CMP14+EGFR - CBC with Differential/Platelet - Lipid panel - Bayer DCA Hb A1c Waived - PSA, total and free - Vitamin B12 - TSH  11. Benign prostatic hyperplasia with nocturia - PSA, total and free   Labs pending Patient reviewed in [place] controlled database, no flags noted. Contract and drug screen are up to date.  Continue current medications  Health Maintenance reviewed Diet and exercise encouraged  Follow up plan: 3 months    Tommas Fragmin, FNP

## 2023-12-23 NOTE — Patient Instructions (Addendum)
 Peripheral Edema  Peripheral edema is swelling that is caused by a buildup of fluid. Peripheral edema most often affects the lower legs, ankles, and feet. It can also develop in the arms, hands, and face. The area of the body that has peripheral edema will look swollen. It may also feel heavy or warm. Your clothes may start to feel tight. Pressing on the area may make a temporary dent in your skin (pitting edema). You may not be able to move your swollen arm or leg as much as usual. There are many causes of peripheral edema. It can happen because of a complication of other conditions such as heart failure, kidney disease, or a problem with your circulation. It also can be a side effect of certain medicines or happen because of an infection. It often happens to women during pregnancy. Sometimes, the cause is not known. Follow these instructions at home: Managing pain, stiffness, and swelling  Raise (elevate) your legs while you are sitting or lying down. Move around often to prevent stiffness and to reduce swelling. Do not sit or stand for long periods of time. Do not wear tight clothing. Do not wear garters on your upper legs. Exercise your legs to get your circulation going. This helps to move the fluid back into your blood vessels, and it may help the swelling go down. Wear compression stockings as told by your health care provider. These stockings help to prevent blood clots and reduce swelling in your legs. It is important that these are the correct size. These stockings should be prescribed by your doctor to prevent possible injuries. If elastic bandages or wraps are recommended, use them as told by your health care provider. Medicines Take over-the-counter and prescription medicines only as told by your health care provider. Your health care provider may prescribe medicine to help your body get rid of excess water (diuretic). Take this medicine if you are told to take it. General  instructions Eat a low-salt (low-sodium) diet as told by your health care provider. Sometimes, eating less salt may reduce swelling. Pay attention to any changes in your symptoms. Moisturize your skin daily to help prevent skin from cracking and draining. Keep all follow-up visits. This is important. Contact a health care provider if: You have a fever. You have swelling in only one leg. You have increased swelling, redness, or pain in one or both of your legs. You have drainage or sores at the area where you have edema. Get help right away if: You have edema that starts suddenly or is getting worse, especially if you are pregnant or have a medical condition. You develop shortness of breath, especially when you are lying down. You have pain in your chest or abdomen. You feel weak. You feel like you will faint. These symptoms may be an emergency. Get help right away. Call 911. Do not wait to see if the symptoms will go away. Do not drive yourself to the hospital. Summary Peripheral edema is swelling that is caused by a buildup of fluid. Peripheral edema most often affects the lower legs, ankles, and feet. Move around often to prevent stiffness and to reduce swelling. Do not sit or stand for long periods of time. Pay attention to any changes in your symptoms. Contact a health care provider if you have edema that starts suddenly or is getting worse, especially if you are pregnant or have a medical condition. Get help right away if you develop shortness of breath, especially when lying down.  This information is not intended to replace advice given to you by your health care provider. Make sure you discuss any questions you have with your health care provider. Document Revised: 03/26/2021 Document Reviewed: 03/26/2021 Elsevier Patient Education  2024 [employer].

## 2023-12-24 ENCOUNTER — Encounter: Payer: Self-pay | Admitting: Family Medicine

## 2023-12-24 LAB — TSH: TSH: 2.52 u[IU]/mL (ref 0.450–4.500)

## 2023-12-24 LAB — CBC WITH DIFFERENTIAL/PLATELET
Basophils Absolute: 0.1 10*3/uL (ref 0.0–0.2)
Basos: 1 %
EOS (ABSOLUTE): 0.1 10*3/uL (ref 0.0–0.4)
Eos: 2 %
Hematocrit: 40.1 % (ref 37.5–51.0)
Hemoglobin: 12.7 g/dL — ABNORMAL LOW (ref 13.0–17.7)
Immature Grans (Abs): 0.1 10*3/uL (ref 0.0–0.1)
Immature Granulocytes: 1 %
Lymphocytes Absolute: 1.7 10*3/uL (ref 0.7–3.1)
Lymphs: 26 %
MCH: 28.2 pg (ref 26.6–33.0)
MCHC: 31.7 g/dL (ref 31.5–35.7)
MCV: 89 fL (ref 79–97)
Monocytes Absolute: 0.6 10*3/uL (ref 0.1–0.9)
Monocytes: 8 %
Neutrophils Absolute: 4.2 10*3/uL (ref 1.4–7.0)
Neutrophils: 62 %
Platelets: 215 10*3/uL (ref 150–450)
RBC: 4.51 x10E6/uL (ref 4.14–5.80)
RDW: 13.7 % (ref 11.6–15.4)
WBC: 6.8 10*3/uL (ref 3.4–10.8)

## 2023-12-24 LAB — CMP14+EGFR
ALT: 37 IU/L (ref 0–44)
AST: 31 IU/L (ref 0–40)
Albumin: 3.9 g/dL (ref 3.9–4.9)
Alkaline Phosphatase: 101 IU/L (ref 44–121)
BUN/Creatinine Ratio: 19 (ref 10–24)
BUN: 16 mg/dL (ref 8–27)
Bilirubin Total: <0.2 mg/dL (ref 0.0–1.2)
CO2: 21 mmol/L (ref 20–29)
Calcium: 9.4 mg/dL (ref 8.6–10.2)
Chloride: 103 mmol/L (ref 96–106)
Creatinine, Ser: 0.83 mg/dL (ref 0.76–1.27)
Globulin, Total: 2.6 g/dL (ref 1.5–4.5)
Glucose: 122 mg/dL — ABNORMAL HIGH (ref 70–99)
Potassium: 4.9 mmol/L (ref 3.5–5.2)
Sodium: 141 mmol/L (ref 134–144)
Total Protein: 6.5 g/dL (ref 6.0–8.5)
eGFR: 94 mL/min/{1.73_m2} (ref >59)

## 2023-12-24 LAB — LIPID PANEL
Chol/HDL Ratio: 6.4 ratio — ABNORMAL HIGH (ref 0.0–5.0)
Cholesterol, Total: 210 mg/dL — ABNORMAL HIGH (ref 100–199)
HDL: 33 mg/dL — ABNORMAL LOW (ref >39)
LDL Chol Calc (NIH): 93 mg/dL (ref 0–99)
Triglycerides: 503 mg/dL — ABNORMAL HIGH (ref 0–149)
VLDL Cholesterol Cal: 84 mg/dL — ABNORMAL HIGH (ref 5–40)

## 2023-12-24 LAB — PSA, TOTAL AND FREE
PSA, Free Pct: 40 %
PSA, Free: 0.12 ng/mL
Prostate Specific Ag, Serum: 0.3 ng/mL (ref 0.0–4.0)

## 2023-12-24 LAB — VITAMIN B12: Vitamin B-12: 1923 pg/mL — ABNORMAL HIGH (ref 232–1245)

## 2023-12-25 ENCOUNTER — Other Ambulatory Visit: Payer: Self-pay | Admitting: Family

## 2023-12-25 ENCOUNTER — Ambulatory Visit: Payer: Self-pay | Admitting: Family

## 2023-12-31 ENCOUNTER — Ambulatory Visit (INDEPENDENT_AMBULATORY_CARE_PROVIDER_SITE_OTHER): Payer: Self-pay | Admitting: Gastroenterology

## 2023-12-31 ENCOUNTER — Encounter (INDEPENDENT_AMBULATORY_CARE_PROVIDER_SITE_OTHER): Payer: Self-pay | Admitting: Gastroenterology

## 2023-12-31 VITALS — BP 127/68 | HR 77 | Temp 98.0°F | Ht 65.0 in | Wt 141.9 lb

## 2023-12-31 DIAGNOSIS — K219 Gastro-esophageal reflux disease without esophagitis: Secondary | ICD-10-CM | POA: Insufficient documentation

## 2023-12-31 DIAGNOSIS — R1310 Dysphagia, unspecified: Secondary | ICD-10-CM

## 2023-12-31 DIAGNOSIS — Z1211 Encounter for screening for malignant neoplasm of colon: Secondary | ICD-10-CM | POA: Insufficient documentation

## 2023-12-31 DIAGNOSIS — R1319 Other dysphagia: Secondary | ICD-10-CM | POA: Insufficient documentation

## 2023-12-31 DIAGNOSIS — K5900 Constipation, unspecified: Secondary | ICD-10-CM | POA: Diagnosis not present

## 2023-12-31 DIAGNOSIS — R143 Flatulence: Secondary | ICD-10-CM | POA: Diagnosis not present

## 2023-12-31 DIAGNOSIS — K5904 Chronic idiopathic constipation: Secondary | ICD-10-CM

## 2023-12-31 MED ORDER — POLYETHYLENE GLYCOL 3350 17 G PO PACK: 17.0000 g | PACK | Freq: Two times a day (BID) | ORAL | 2 refills | Status: AC

## 2023-12-31 MED ORDER — PANTOPRAZOLE SODIUM 40 MG PO TBEC: 40.0000 mg | DELAYED_RELEASE_TABLET | Freq: Every day | ORAL | 3 refills | Status: DC

## 2023-12-31 NOTE — Progress Notes (Signed)
 Amour Cutrone Faizan Williette Loewe , M.D. Gastroenterology & Hepatology [facility] [address] [place], [place] [address] Primary Care Physician: Yevette Hem, FNP [address] [place] [address]  Chief Complaint: Dysphagia, GERD, colon cancer screening, intermittent constipation  History of Present Illness: Jimmy Craig is a 73 y.o. male with hypertension, GERD, hyperlipidemia, diabetes, chronic back pain, BPH, COPD who presents for evaluation of Dysphagia, GERD, colon cancer screening and intermittent constipation.  Patient reports that for past few months he has been having solid food dysphagia as if food is getting getting stuck in the upper chest mostly with rice and chicken.  Denies any history of food impactions  Patient reports Bristol stool scale type II-III bowel movement every other day with occasional straining and sense of incomplete evacuation.  This is accompanied by excessive flatulence  The patient denies having any nausea, vomiting, fever, chills, hematochezia, melena, hematemesis, abdominal pain, diarrhea, jaundice, pruritus or weight loss.  Last WGN:FAOZ Last Colonoscopy: 2016 negative colonoscopy suggest repeat 10 years  FHx: neg for any gastrointestinal/liver disease, no malignancies Social: neg smoking, alcohol or illicit drug use Surgical: no abdominal surgeries  Past Medical History: Past Medical History:  Diagnosis Date   Arthritis    BPH (benign prostatic hyperplasia)    Chronic back pain    GERD (gastroesophageal reflux disease)    Hypertension    Mixed hyperlipidemia    Type 2 diabetes mellitus (HCC)     Past Surgical History: Past Surgical History:  Procedure Laterality Date   BACK SURGERY  2023   CATARACT EXTRACTION Right    CATARACT EXTRACTION W/PHACO Left 12/20/2013   Procedure: CATARACT EXTRACTION PHACO AND INTRAOCULAR LENS PLACEMENT (IOC);  Surgeon: Clay Cummins, MD;  Location: [facility];   Service: Ophthalmology;  Laterality: Left;  CDE: 0.97    Family History: Family History  Problem Relation Age of Onset   Diabetes Mother     Social History: Social History   Tobacco Use  Smoking Status Never  Smokeless Tobacco Never   Social History   Substance and Sexual Activity  Alcohol Use No   Social History   Substance and Sexual Activity  Drug Use No    Allergies: No Known Allergies  Medications: Current Outpatient Medications  Medication Sig Dispense Refill   albuterol  (VENTOLIN  HFA) 108 (90 Base) MCG/ACT inhaler Inhale 2 puffs into the lungs every 6 (six) hours as needed for wheezing or shortness of breath. 9 g 2   atorvastatin  (LIPITOR) 40 MG tablet Take 1 tablet (40 mg total) by mouth daily. 90 tablet 1   azelastine  (OPTIVAR ) 0.05 % ophthalmic solution INSTILL 1 DROP INTO EACH EYE TWICE DAILY 6 mL 2   baclofen  (LIORESAL ) 10 MG tablet TAKE 1 TABLET BY MOUTH THREE TIMES DAILY 180 tablet 2   blood glucose meter kit and supplies Dispense based on patient and insurance preference. Use up to four times daily as directed. (FOR ICD-10 E10.9, E11.9). 1 each 0   Blood Glucose Monitoring Suppl DEVI 1 each by Does not apply route in the morning, at noon, and at bedtime. May substitute to any manufacturer covered by patient's insurance. 1 each 0   Blood Glucose Monitoring Suppl DEVI 1 each by Does not apply route in the morning, at noon, and at bedtime. May substitute to any manufacturer covered by patient's insurance. 1 each 0   celecoxib  (CELEBREX ) 200 MG capsule Take 1 capsule by mouth once daily 90 capsule 0  cetirizine  (ZYRTEC ) 10 MG tablet Take 1 tablet (10 mg total) by mouth daily. 90 tablet 2   clopidogrel  (PLAVIX ) 75 MG tablet Take 1 tablet (75 mg total) by mouth daily. 90 tablet 1   dapagliflozin  propanediol (FARXIGA ) 10 MG TABS tablet Take 1 tablet (10 mg total) by mouth daily. 90 tablet 1   Dulaglutide  (TRULICITY ) 4.5 MG/0.5ML SOAJ Inject 4.5 mg into the skin  once a week. 4 mL 2   Fish Oil-Cholecalciferol (OMEGA-3 + VITAMIN D3 PO) Take by mouth.     fluticasone  (FLONASE ) 50 MCG/ACT nasal spray Use 2 spray(s) in each nostril once daily 48 g 1   fluticasone  furoate-vilanterol (BREO ELLIPTA ) 200-25 MCG/ACT AEPB Inhale 1 puff into the lungs daily. 1 each 2   glucose blood (ACCU-CHEK GUIDE TEST) test strip Check BS TID in the morning, at noon and at bedtime Dx E11.9 300 each 3   Insulin  Pen Needle (PEN NEEDLES) 32G X 4 MM MISC 1 application by Does not apply route daily. 100 each 11   Lancets (ONETOUCH DELICA PLUS LANCET33G) MISC Apply 1 each topically 4 (four) times daily.     levocetirizine (XYZAL ) 5 MG tablet TAKE 1 TABLET BY MOUTH ONCE DAILY IN THE EVENING 90 tablet 1   metFORMIN  (GLUCOPHAGE ) 1000 MG tablet TAKE 1 TABLET BY MOUTH TWICE DAILY WITH A MEAL 180 tablet 0   Multiple Vitamin (MULTIVITAMIN WITH MINERALS) TABS tablet Take 1 tablet by mouth daily.     olopatadine  (PATADAY ) 0.1 % ophthalmic solution Place 1 drop into both eyes 2 (two) times daily. 5 mL 12   Omega-3 Fatty Acids (FISH OIL PO) Take by mouth.     pantoprazole  (PROTONIX ) 40 MG tablet Take 1 tablet (40 mg total) by mouth daily. 60 tablet 3   polyethylene glycol (MIRALAX  / GLYCOLAX ) 17 g packet Take 17 g by mouth 2 (two) times daily. 60 packet 2   pregabalin  (LYRICA ) 150 MG capsule Take 1 capsule (150 mg total) by mouth 2 (two) times daily. 180 capsule 2   simethicone  (MYLICON) 125 MG chewable tablet Chew 1 tablet (125 mg total) by mouth every 6 (six) hours as needed for flatulence. 90 tablet 2   tamsulosin  (FLOMAX ) 0.4 MG CAPS capsule Take 1 capsule (0.4 mg total) by mouth in the morning and at bedtime. 180 capsule 3   traMADol  (ULTRAM ) 50 MG tablet Take 2 tablets (100 mg total) by mouth 2 (two) times daily. 120 tablet 2   TRESIBA  FLEXTOUCH 100 UNIT/ML FlexTouch Pen INJECT 42 UNITS SUBCUTANEOUSLY ONCE DAILY 15 mL 0   lisinopril  (ZESTRIL ) 10 MG tablet Take 1 tablet (10 mg total) by mouth  daily. (Patient not taking: Reported on 12/31/2023) 90 tablet 3   trimethoprim-polymyxin b (POLYTRIM) ophthalmic solution SMARTSIG:In Eye(s) (Patient not taking: Reported on 12/31/2023)     No current facility-administered medications for this visit.    Review of Systems: GENERAL: negative for malaise, night sweats HEENT: No changes in hearing or vision, no nose bleeds or other nasal problems. NECK: Negative for lumps, goiter, pain and significant neck swelling RESPIRATORY: Negative for cough, wheezing CARDIOVASCULAR: Negative for chest pain, leg swelling, palpitations, orthopnea GI: SEE HPI MUSCULOSKELETAL: Negative for joint pain or swelling, back pain, and muscle pain. SKIN: Negative for lesions, rash HEMATOLOGY Negative for prolonged bleeding, bruising easily, and swollen nodes. ENDOCRINE: Negative for cold or heat intolerance, polyuria, polydipsia and goiter. NEURO: negative for tremor, gait imbalance, syncope and seizures. The remainder of the review of systems is  noncontributory.   Physical Exam: BP 127/68   Pulse 77   Temp 98 F (36.7 C)   Ht 5\' 5"  (1.651 m)   Wt 141 lb 14.4 oz (64.4 kg)   BMI 23.61 kg/m  GENERAL: The patient is AO x3, in no acute distress. HEENT: Head is normocephalic and atraumatic. EOMI are intact. Mouth is well hydrated and without lesions. NECK: Supple. No masses LUNGS: Clear to auscultation. No presence of rhonchi/wheezing/rales. Adequate chest expansion HEART: RRR, normal s1 and s2. ABDOMEN: Soft, nontender, no guarding, no peritoneal signs, and nondistended. BS +. No masses.  Imaging/Labs: as above     Latest Ref Rng & Units 12/23/2023    2:50 PM 12/05/2022    3:03 PM 05/13/2022    3:12 PM  CBC  WBC 3.4 - 10.8 x10E3/uL 6.8  5.5  5.6   Hemoglobin 13.0 - 17.7 g/dL 96.0  45.4  09.8   Hematocrit 37.5 - 51.0 % 40.1  41.0  37.6   Platelets 150 - 450 x10E3/uL 215  274  327    Lab Results  Component Value Date   IRON 26 (L) 05/13/2022   TIBC  376 05/13/2022   FERRITIN 75 08/14/2018    I personally reviewed and interpreted the available labs, imaging and endoscopic files.  Impression and Plan:  Jimmy Craig is a 73 y.o. male with hypertension, GERD, hyperlipidemia, diabetes, chronic back pain, BPH, COPD who presents for evaluation of Dysphagia, GERD, colon cancer screening and intermittent constipation.  #Dysphagia #Chronic GERD   Patient has progressive solid food dysphagia and in setting of chronic GERD This could be esophageal web, ring and stricture  Pantoprazole   40mg   daily - advised to take 30 min before breakfast  GERD recommendations:  1) Avoid coffee, tea, cola beverages, carbonated beverages, spicy foods, greasy foods, foods high in acid content (e.g. tomatoes and citrus fruits), chocolate, and peppermint 2) Eat small meals and keep weight within normal range 3) Avoid recumbent posture for 3 hours post-prandially 4) Elevate head of bed  Will plan on upper endoscopy with biopsies/dilation  #Intermittent constipation #Excessive flatulence  Excessive flatulence in setting of chronic intermittent constipation.  Diet is low in fluid and fiber  Ensure adequate fluid intake: Aim for 8 glasses of water daily. Follow a high fiber diet: Include foods such as dates, prunes, pears, and kiwi. Take Miralax  twice a day for the first week, then reduce to once daily thereafter.  Low FODMAP diet   BEANO as needed If flatulence continues despite controlling constipation may need SIBO testing which  #Colon cancer screening   The patient was counseled regarding the importance of colorectal cancer screening,  The benefits of screening include early detection of colorectal cancer and precancerous polyps, which can improve treatment outcomes and reduce mortality. Risks associated with screening, particularly colonoscopy, include potential complications such as bleeding and perforation. After deciding different modalities  for screening for colon cancer , patient has opted to pursue Colonoscopy    All questions were answered.      Graelyn Bihl Faizan Silvester Reierson, MD Gastroenterology and Hepatology [facility]   This chart has been completed using [employer] Dictation software, and while attempts have been made to ensure accuracy , certain words and phrases may not be transcribed as intended

## 2023-12-31 NOTE — H&P (View-Only) (Signed)
 Amour Cutrone Faizan Williette Loewe , M.D. Gastroenterology & Hepatology [facility] [address] [place], [place] [address] Primary Care Physician: Yevette Hem, FNP [address] [place] [address]  Chief Complaint: Dysphagia, GERD, colon cancer screening, intermittent constipation  History of Present Illness: Jimmy Craig is a 73 y.o. male with hypertension, GERD, hyperlipidemia, diabetes, chronic back pain, BPH, COPD who presents for evaluation of Dysphagia, GERD, colon cancer screening and intermittent constipation.  Patient reports that for past few months he has been having solid food dysphagia as if food is getting getting stuck in the upper chest mostly with rice and chicken.  Denies any history of food impactions  Patient reports Bristol stool scale type II-III bowel movement every other day with occasional straining and sense of incomplete evacuation.  This is accompanied by excessive flatulence  The patient denies having any nausea, vomiting, fever, chills, hematochezia, melena, hematemesis, abdominal pain, diarrhea, jaundice, pruritus or weight loss.  Last WGN:FAOZ Last Colonoscopy: 2016 negative colonoscopy suggest repeat 10 years  FHx: neg for any gastrointestinal/liver disease, no malignancies Social: neg smoking, alcohol or illicit drug use Surgical: no abdominal surgeries  Past Medical History: Past Medical History:  Diagnosis Date   Arthritis    BPH (benign prostatic hyperplasia)    Chronic back pain    GERD (gastroesophageal reflux disease)    Hypertension    Mixed hyperlipidemia    Type 2 diabetes mellitus (HCC)     Past Surgical History: Past Surgical History:  Procedure Laterality Date   BACK SURGERY  2023   CATARACT EXTRACTION Right    CATARACT EXTRACTION W/PHACO Left 12/20/2013   Procedure: CATARACT EXTRACTION PHACO AND INTRAOCULAR LENS PLACEMENT (IOC);  Surgeon: Clay Cummins, MD;  Location: [facility];   Service: Ophthalmology;  Laterality: Left;  CDE: 0.97    Family History: Family History  Problem Relation Age of Onset   Diabetes Mother     Social History: Social History   Tobacco Use  Smoking Status Never  Smokeless Tobacco Never   Social History   Substance and Sexual Activity  Alcohol Use No   Social History   Substance and Sexual Activity  Drug Use No    Allergies: No Known Allergies  Medications: Current Outpatient Medications  Medication Sig Dispense Refill   albuterol  (VENTOLIN  HFA) 108 (90 Base) MCG/ACT inhaler Inhale 2 puffs into the lungs every 6 (six) hours as needed for wheezing or shortness of breath. 9 g 2   atorvastatin  (LIPITOR) 40 MG tablet Take 1 tablet (40 mg total) by mouth daily. 90 tablet 1   azelastine  (OPTIVAR ) 0.05 % ophthalmic solution INSTILL 1 DROP INTO EACH EYE TWICE DAILY 6 mL 2   baclofen  (LIORESAL ) 10 MG tablet TAKE 1 TABLET BY MOUTH THREE TIMES DAILY 180 tablet 2   blood glucose meter kit and supplies Dispense based on patient and insurance preference. Use up to four times daily as directed. (FOR ICD-10 E10.9, E11.9). 1 each 0   Blood Glucose Monitoring Suppl DEVI 1 each by Does not apply route in the morning, at noon, and at bedtime. May substitute to any manufacturer covered by patient's insurance. 1 each 0   Blood Glucose Monitoring Suppl DEVI 1 each by Does not apply route in the morning, at noon, and at bedtime. May substitute to any manufacturer covered by patient's insurance. 1 each 0   celecoxib  (CELEBREX ) 200 MG capsule Take 1 capsule by mouth once daily 90 capsule 0  cetirizine  (ZYRTEC ) 10 MG tablet Take 1 tablet (10 mg total) by mouth daily. 90 tablet 2   clopidogrel  (PLAVIX ) 75 MG tablet Take 1 tablet (75 mg total) by mouth daily. 90 tablet 1   dapagliflozin  propanediol (FARXIGA ) 10 MG TABS tablet Take 1 tablet (10 mg total) by mouth daily. 90 tablet 1   Dulaglutide  (TRULICITY ) 4.5 MG/0.5ML SOAJ Inject 4.5 mg into the skin  once a week. 4 mL 2   Fish Oil-Cholecalciferol (OMEGA-3 + VITAMIN D3 PO) Take by mouth.     fluticasone  (FLONASE ) 50 MCG/ACT nasal spray Use 2 spray(s) in each nostril once daily 48 g 1   fluticasone  furoate-vilanterol (BREO ELLIPTA ) 200-25 MCG/ACT AEPB Inhale 1 puff into the lungs daily. 1 each 2   glucose blood (ACCU-CHEK GUIDE TEST) test strip Check BS TID in the morning, at noon and at bedtime Dx E11.9 300 each 3   Insulin  Pen Needle (PEN NEEDLES) 32G X 4 MM MISC 1 application by Does not apply route daily. 100 each 11   Lancets (ONETOUCH DELICA PLUS LANCET33G) MISC Apply 1 each topically 4 (four) times daily.     levocetirizine (XYZAL ) 5 MG tablet TAKE 1 TABLET BY MOUTH ONCE DAILY IN THE EVENING 90 tablet 1   metFORMIN  (GLUCOPHAGE ) 1000 MG tablet TAKE 1 TABLET BY MOUTH TWICE DAILY WITH A MEAL 180 tablet 0   Multiple Vitamin (MULTIVITAMIN WITH MINERALS) TABS tablet Take 1 tablet by mouth daily.     olopatadine  (PATADAY ) 0.1 % ophthalmic solution Place 1 drop into both eyes 2 (two) times daily. 5 mL 12   Omega-3 Fatty Acids (FISH OIL PO) Take by mouth.     pantoprazole  (PROTONIX ) 40 MG tablet Take 1 tablet (40 mg total) by mouth daily. 60 tablet 3   polyethylene glycol (MIRALAX  / GLYCOLAX ) 17 g packet Take 17 g by mouth 2 (two) times daily. 60 packet 2   pregabalin  (LYRICA ) 150 MG capsule Take 1 capsule (150 mg total) by mouth 2 (two) times daily. 180 capsule 2   simethicone  (MYLICON) 125 MG chewable tablet Chew 1 tablet (125 mg total) by mouth every 6 (six) hours as needed for flatulence. 90 tablet 2   tamsulosin  (FLOMAX ) 0.4 MG CAPS capsule Take 1 capsule (0.4 mg total) by mouth in the morning and at bedtime. 180 capsule 3   traMADol  (ULTRAM ) 50 MG tablet Take 2 tablets (100 mg total) by mouth 2 (two) times daily. 120 tablet 2   TRESIBA  FLEXTOUCH 100 UNIT/ML FlexTouch Pen INJECT 42 UNITS SUBCUTANEOUSLY ONCE DAILY 15 mL 0   lisinopril  (ZESTRIL ) 10 MG tablet Take 1 tablet (10 mg total) by mouth  daily. (Patient not taking: Reported on 12/31/2023) 90 tablet 3   trimethoprim-polymyxin b (POLYTRIM) ophthalmic solution SMARTSIG:In Eye(s) (Patient not taking: Reported on 12/31/2023)     No current facility-administered medications for this visit.    Review of Systems: GENERAL: negative for malaise, night sweats HEENT: No changes in hearing or vision, no nose bleeds or other nasal problems. NECK: Negative for lumps, goiter, pain and significant neck swelling RESPIRATORY: Negative for cough, wheezing CARDIOVASCULAR: Negative for chest pain, leg swelling, palpitations, orthopnea GI: SEE HPI MUSCULOSKELETAL: Negative for joint pain or swelling, back pain, and muscle pain. SKIN: Negative for lesions, rash HEMATOLOGY Negative for prolonged bleeding, bruising easily, and swollen nodes. ENDOCRINE: Negative for cold or heat intolerance, polyuria, polydipsia and goiter. NEURO: negative for tremor, gait imbalance, syncope and seizures. The remainder of the review of systems is  noncontributory.   Physical Exam: BP 127/68   Pulse 77   Temp 98 F (36.7 C)   Ht 5\' 5"  (1.651 m)   Wt 141 lb 14.4 oz (64.4 kg)   BMI 23.61 kg/m  GENERAL: The patient is AO x3, in no acute distress. HEENT: Head is normocephalic and atraumatic. EOMI are intact. Mouth is well hydrated and without lesions. NECK: Supple. No masses LUNGS: Clear to auscultation. No presence of rhonchi/wheezing/rales. Adequate chest expansion HEART: RRR, normal s1 and s2. ABDOMEN: Soft, nontender, no guarding, no peritoneal signs, and nondistended. BS +. No masses.  Imaging/Labs: as above     Latest Ref Rng & Units 12/23/2023    2:50 PM 12/05/2022    3:03 PM 05/13/2022    3:12 PM  CBC  WBC 3.4 - 10.8 x10E3/uL 6.8  5.5  5.6   Hemoglobin 13.0 - 17.7 g/dL 96.0  45.4  09.8   Hematocrit 37.5 - 51.0 % 40.1  41.0  37.6   Platelets 150 - 450 x10E3/uL 215  274  327    Lab Results  Component Value Date   IRON 26 (L) 05/13/2022   TIBC  376 05/13/2022   FERRITIN 75 08/14/2018    I personally reviewed and interpreted the available labs, imaging and endoscopic files.  Impression and Plan:  Jimmy Craig is a 73 y.o. male with hypertension, GERD, hyperlipidemia, diabetes, chronic back pain, BPH, COPD who presents for evaluation of Dysphagia, GERD, colon cancer screening and intermittent constipation.  #Dysphagia #Chronic GERD   Patient has progressive solid food dysphagia and in setting of chronic GERD This could be esophageal web, ring and stricture  Pantoprazole   40mg   daily - advised to take 30 min before breakfast  GERD recommendations:  1) Avoid coffee, tea, cola beverages, carbonated beverages, spicy foods, greasy foods, foods high in acid content (e.g. tomatoes and citrus fruits), chocolate, and peppermint 2) Eat small meals and keep weight within normal range 3) Avoid recumbent posture for 3 hours post-prandially 4) Elevate head of bed  Will plan on upper endoscopy with biopsies/dilation  #Intermittent constipation #Excessive flatulence  Excessive flatulence in setting of chronic intermittent constipation.  Diet is low in fluid and fiber  Ensure adequate fluid intake: Aim for 8 glasses of water daily. Follow a high fiber diet: Include foods such as dates, prunes, pears, and kiwi. Take Miralax  twice a day for the first week, then reduce to once daily thereafter.  Low FODMAP diet   BEANO as needed If flatulence continues despite controlling constipation may need SIBO testing which  #Colon cancer screening   The patient was counseled regarding the importance of colorectal cancer screening,  The benefits of screening include early detection of colorectal cancer and precancerous polyps, which can improve treatment outcomes and reduce mortality. Risks associated with screening, particularly colonoscopy, include potential complications such as bleeding and perforation. After deciding different modalities  for screening for colon cancer , patient has opted to pursue Colonoscopy    All questions were answered.      Graelyn Bihl Faizan Silvester Reierson, MD Gastroenterology and Hepatology [facility]   This chart has been completed using [employer] Dict[employer] software, and while attempts have been made to ensure accuracy , certain words and phrases may not be transcribed as intended

## 2023-12-31 NOTE — Patient Instructions (Addendum)
 It was very nice to meet you today, as dicussed with will plan for the following :  A) upper endoscopy and colonoscopy   B)   Pantoprazole   40mg   daily - advised to take 30 min before breakfast  GERD recommendations:  1) Avoid coffee, tea, cola beverages, carbonated beverages, spicy foods, greasy foods, foods high in acid content (e.g. tomatoes and citrus fruits), chocolate, and peppermint 2) Eat small meals and keep weight within normal range 3) Avoid recumbent posture for 3 hours post-prandially 4) Elevate head of bed   C) Ensure adequate fluid intake: Aim for 8 glasses of water daily. Follow a high fiber diet: Include foods such as dates, prunes, pears, and kiwi. Take Miralax  twice a day for the first week, then reduce to once daily thereafter.  D) Jimmy Craig

## 2024-01-01 ENCOUNTER — Telehealth (INDEPENDENT_AMBULATORY_CARE_PROVIDER_SITE_OTHER): Payer: Self-pay | Admitting: *Deleted

## 2024-01-01 ENCOUNTER — Encounter: Payer: Self-pay | Admitting: *Deleted

## 2024-01-01 ENCOUNTER — Other Ambulatory Visit (INDEPENDENT_AMBULATORY_CARE_PROVIDER_SITE_OTHER): Payer: Self-pay | Admitting: *Deleted

## 2024-01-01 MED ORDER — PEG 3350-KCL-NA BICARB-NACL 420 G PO SOLR: 4000.0000 mL | Freq: Once | ORAL | 0 refills | Status: AC

## 2024-01-01 NOTE — Telephone Encounter (Signed)
 FYI

## 2024-01-01 NOTE — Telephone Encounter (Signed)
  Request for patient to stop medication prior to procedure or is needing cleareance  01/01/24  Jimmy Craig [birth year]  What type of surgery is being performed? Colonoscopy/EGD  When is surgery scheduled? 01/22/24  What type of clearance is required (medical or pharmacy to hold medication or both? medication  Are there any medications that need to be held prior to surgery and how long? Plavix  x 5 days  Name of physician performing surgery?  Dr.Ahmed Lannie [facility] Phone: [phone] Fax: [phone]  Anethesia type (none, local, MAC, general)? MAC

## 2024-01-01 NOTE — Telephone Encounter (Signed)
 Pt is scheduled for 01/22/24. Instructions mailed and prep sent to the pharmacy

## 2024-01-01 NOTE — Telephone Encounter (Signed)
 Thanks, Can schedule procedure

## 2024-01-01 NOTE — Telephone Encounter (Signed)
Ok to hold Plavix for 5 days.

## 2024-01-02 ENCOUNTER — Other Ambulatory Visit: Payer: Self-pay | Admitting: Family

## 2024-01-02 DIAGNOSIS — M25559 Pain in unspecified hip: Secondary | ICD-10-CM

## 2024-01-02 DIAGNOSIS — M150 Primary generalized (osteo)arthritis: Secondary | ICD-10-CM

## 2024-01-02 DIAGNOSIS — E1142 Type 2 diabetes mellitus with diabetic polyneuropathy: Secondary | ICD-10-CM

## 2024-01-12 ENCOUNTER — Ambulatory Visit: Payer: Self-pay

## 2024-01-12 ENCOUNTER — Other Ambulatory Visit: Payer: Self-pay | Admitting: Family

## 2024-01-12 MED ORDER — AMOXICILLIN-POT CLAVULANATE 875-125 MG PO TABS: 1.0000 | ORAL_TABLET | Freq: Two times a day (BID) | ORAL | 0 refills | Status: DC

## 2024-01-12 NOTE — Telephone Encounter (Signed)
 PATIENT WENT TO [facility]

## 2024-01-12 NOTE — Telephone Encounter (Addendum)
 FYI Only or Action Required?: FYI only for provider  Patient was last seen in primary care on 12/23/2023 by Yevette Hem, FNP. Called Nurse Triage reporting Facial Pain. Symptoms began about a month ago. Interventions attempted: Nothing. Symptoms are: rapidly worsening.  Triage Disposition: See Physician Within 24 Hours  Patient/caregiver understands and will follow disposition?: Unsure     Copied from CRM [id number]. Topic: Clinical - Red Word Triage >> Jan 12, 2024  2:21 PM Rosamond Comes wrote: Red Word that prompted transfer to Nurse Triage: patient calling right side of face in pain, teeth pain, bad order in right nostril Reason for Disposition  [1] MODERATE pain (e.g., interferes with normal activities) AND [2] constant AND [3] present > 24 hours  Answer Assessment - Initial Assessment Questions 1. ONSET: "When did the pain start?" (e.g., minutes, hours, days)     4 weeks  2. ONSET: "Does the pain come and go, or has it been constant since it started?" (e.g., constant, intermittent, fleeting)     Constant  3. SEVERITY: "How bad is the pain?"   (Scale 1-10; mild, moderate or severe)   - MILD (1-3): doesn't interfere with normal activities    - MODERATE (4-7): interferes with normal activities or awakens from sleep    - SEVERE (8-10): excruciating pain, unable to do any normal activities      4/10 4. LOCATION: "Where does it hurt?"      Right side of face, right eye, mouth pain  5. RASH: "Is there any redness, rash, or swelling of the face?"     no 6. FEVER: "Do you have a fever?" If Yes, ask: "What is it, how was it measured, and when did it start?"      no 7. OTHER SYMPTOMS: "Do you have any other symptoms?" (e.g., fever, toothache, nasal discharge, nasal congestion, clicking sensation in jaw joint)     Bad odor from nose (greenish) teeth pain  Protocols used: Face Pain-A-AH Pt reluctant to go to UC or ED- wanting appt. Pt green nasal secretions.

## 2024-01-12 NOTE — Addendum Note (Signed)
 Addended by: Tommas Fragmin A on: 01/12/2024 04:41 PM   Modules accepted: Orders

## 2024-01-12 NOTE — Telephone Encounter (Signed)
Augmentin Prescription sent to pharmacy   

## 2024-01-12 NOTE — Telephone Encounter (Signed)
LMTCB Jimmy Craig

## 2024-01-19 NOTE — Patient Instructions (Signed)
 Jimmy Craig  01/19/2024     @PREFPERIOPPHARMACY @   Your procedure is scheduled on  01/22/2024.   Report to [facility] at  1200  P.M.   Call this number if you have problems the morning of surgery:  [phone]  If you experience any cold or flu symptoms such as cough, fever, chills, shortness of breath, etc. between now and your scheduled surgery, please notify us  at the above number.   Remember:        Your last dose of trulicity  should be on 01/14/2024.        Your last dose of plavix  should be on 01/16/2024.        Your last dose of farxiga  should be on 01/18/2024.       Take 1/2 of your usual night time insulin  dosage the night before your procedure.         DO NOT take any medications for diabetes the morning of your procedure.        Use your inhalers before you come and bring your rescue inhaler with you.    Follow the diet and prep instructions given to you by the office.    You may drink clear liquids until 1000 am on 01/22/2024.      Clear liquids allowed are:                    Water, Juice (No red color; non-citric and without pulp; diabetics please choose diet or no sugar options), Carbonated beverages (diabetics please choose diet or no sugar options), Clear Tea (No creamer, milk, or cream, including half & half and powdered creamer), Black Coffee Only (No creamer, milk or cream, including half & half and powdered creamer), and Clear Sports drink (No red color; diabetics please choose diet or no sugar options)     Take these medicines the morning of surgery with A SIP OF WATER                    baclofen , pantoprazole , pregabalin , tramadol .    Do not wear jewelry, make-up or nail polish, including gel polish,  artificial nails, or any other type of covering on natural nails (fingers and  toes).  Do not wear lotions, powders, or perfumes, or deodorant.  Do not shave 48 hours prior to surgery.  Men may shave face and neck.  Do not bring  valuables to the hospital.  [facility] is not responsible for any belongings or valuables.  Contacts, dentures or bridgework may not be worn into surgery.  Leave your suitcase in the car.  After surgery it may be brought to your room.  For patients admitted to the hospital, discharge time will be determined by your treatment team.  Patients discharged the day of surgery will not be allowed to drive home and must have someone with them for 24 hours.    Special instructions:   DO NOT smoke tobacco or vape for 24 hours before your procedure.  Please read over the following fact sheets that you were given. Anesthesia Post-op Instructions and Care and Recovery After Surgery      Upper Endoscopy, Adult, Care After After the procedure, it is common to have a sore throat. It is also common to have: Mild stomach pain or discomfort. Bloating. Nausea. Follow these instructions at home: The instructions below may help you care for yourself at home. Your health care provider may give you  more instructions. If you have questions, ask your health care provider. If you were given a sedative during the procedure, it can affect you for several hours. Do not drive or operate machinery until your health care provider says that it is safe. If you will be going home right after the procedure, plan to have a responsible adult: Take you home from the hospital or clinic. You will not be allowed to drive. Care for you for the time you are told. Follow instructions from your health care provider about what you may eat and drink. Return to your normal activities as told by your health care provider. Ask your health care provider what activities are safe for you. Take over-the-counter and prescription medicines only as told by your health care provider. Contact a health care provider if you: Have a sore throat that lasts longer than one day. Have trouble swallowing. Have a fever. Get help right away if  you: Vomit blood or your vomit looks like coffee grounds. Have bloody, black, or tarry stools. Have a very bad sore throat or you cannot swallow. Have difficulty breathing or very bad pain in your chest or abdomen. These symptoms may be an emergency. Get help right away. Call 911. Do not wait to see if the symptoms will go away. Do not drive yourself to the hospital. Summary After the procedure, it is common to have a sore throat, mild stomach discomfort, bloating, and nausea. If you were given a sedative during the procedure, it can affect you for several hours. Do not drive until your health care provider says that it is safe. Follow instructions from your health care provider about what you may eat and drink. Return to your normal activities as told by your health care provider. This information is not intended to replace advice given to you by your health care provider. Make sure you discuss any questions you have with your health care provider. Document Revised: 10/31/2021 Document Reviewed: 10/31/2021 Elsevier Patient Education  2024 [employer].Colonoscopy, Adult, Care After The following information offers guidance on how to care for yourself after your procedure. Your health care provider may also give you more specific instructions. If you have problems or questions, contact your health care provider. What can I expect after the procedure? After the procedure, it is common to have: A small amount of blood in your stool for 24 hours after the procedure. Some gas. Mild cramping or bloating of your abdomen. Follow these instructions at home: Eating and drinking  Drink enough fluid to keep your urine pale yellow. Follow instructions from your health care provider about eating or drinking restrictions. Resume your normal diet as told by your health care provider. Avoid heavy or fried foods that are hard to digest. Activity Rest as told by your health care provider. Avoid sitting  for a long time without moving. Get up to take short walks every 1-2 hours. This is important to improve blood flow and breathing. Ask for help if you feel weak or unsteady. Return to your normal activities as told by your health care provider. Ask your health care provider what activities are safe for you. Managing cramping and bloating  Try walking around when you have cramps or feel bloated. If directed, apply heat to your abdomen as told by your health care provider. Use the heat source that your health care provider recommends, such as a moist heat pack or a heating pad. Place a towel between your skin and the heat source.  Leave the heat on for 20-30 minutes. Remove the heat if your skin turns bright red. This is especially important if you are unable to feel pain, heat, or cold. You have a greater risk of getting burned. General instructions If you were given a sedative during the procedure, it can affect you for several hours. Do not drive or operate machinery until your health care provider says that it is safe. For the first 24 hours after the procedure: Do not sign important documents. Do not drink alcohol. Do your regular daily activities at a slower pace than normal. Eat soft foods that are easy to digest. Take over-the-counter and prescription medicines only as told by your health care provider. Keep all follow-up visits. This is important. Contact a health care provider if: You have blood in your stool 2-3 days after the procedure. Get help right away if: You have more than a small spotting of blood in your stool. You have large blood clots in your stool. You have swelling of your abdomen. You have nausea or vomiting. You have a fever. You have increasing pain in your abdomen that is not relieved with medicine. These symptoms may be an emergency. Get help right away. Call 911. Do not wait to see if the symptoms will go away. Do not drive yourself to the  hospital. Summary After the procedure, it is common to have a small amount of blood in your stool. You may also have mild cramping and bloating of your abdomen. If you were given a sedative during the procedure, it can affect you for several hours. Do not drive or operate machinery until your health care provider says that it is safe. Get help right away if you have a lot of blood in your stool, nausea or vomiting, a fever, or increased pain in your abdomen. This information is not intended to replace advice given to you by your health care provider. Make sure you discuss any questions you have with your health care provider. Document Revised: 09/03/2022 Document Reviewed: 03/14/2021 Elsevier Patient Education  2024 Elsevier Inc.General Anesthesia, Adult, Care After The following information offers guidance on how to care for yourself after your procedure. Your health care provider may also give you more specific instructions. If you have problems or questions, contact your health care provider. What can I expect after the procedure? After the procedure, it is common for people to: Have pain or discomfort at the IV site. Have nausea or vomiting. Have a sore throat or hoarseness. Have trouble concentrating. Feel cold or chills. Feel weak, sleepy, or tired (fatigue). Have soreness and body aches. These can affect parts of the body that were not involved in surgery. Follow these instructions at home: For the time period you were told by your health care provider:  Rest. Do not participate in activities where you could fall or become injured. Do not drive or use machinery. Do not drink alcohol. Do not take sleeping pills or medicines that cause drowsiness. Do not make important decisions or sign legal documents. Do not take care of children on your own. General instructions Drink enough fluid to keep your urine pale yellow. If you have sleep apnea, surgery and certain medicines can  increase your risk for breathing problems. Follow instructions from your health care provider about wearing your sleep device: Anytime you are sleeping, including during daytime naps. While taking prescription pain medicines, sleeping medicines, or medicines that make you drowsy. Return to your normal activities as told by your  health care provider. Ask your health care provider what activities are safe for you. Take over-the-counter and prescription medicines only as told by your health care provider. Do not use any products that contain nicotine or tobacco. These products include cigarettes, chewing tobacco, and vaping devices, such as e-cigarettes. These can delay incision healing after surgery. If you need help quitting, ask your health care provider. Contact a health care provider if: You have nausea or vomiting that does not get better with medicine. You vomit every time you eat or drink. You have pain that does not get better with medicine. You cannot urinate or have bloody urine. You develop a skin rash. You have a fever. Get help right away if: You have trouble breathing. You have chest pain. You vomit blood. These symptoms may be an emergency. Get help right away. Call 911. Do not wait to see if the symptoms will go away. Do not drive yourself to the hospital. Summary After the procedure, it is common to have a sore throat, hoarseness, nausea, vomiting, or to feel weak, sleepy, or fatigue. For the time period you were told by your health care provider, do not drive or use machinery. Get help right away if you have difficulty breathing, have chest pain, or vomit blood. These symptoms may be an emergency. This information is not intended to replace advice given to you by your health care provider. Make sure you discuss any questions you have with your health care provider. Document Revised: 10/19/2021 Document Reviewed: 10/19/2021 Elsevier Patient Education  2024 [employer].

## 2024-01-20 ENCOUNTER — Encounter (HOSPITAL_COMMUNITY)
Admission: RE | Admit: 2024-01-20 | Discharge: 2024-01-20 | Disposition: A | Discharge status: Home / Self Care | Source: Ambulatory Visit | Attending: Gastroenterology | Admitting: Gastroenterology

## 2024-01-20 ENCOUNTER — Encounter (HOSPITAL_COMMUNITY): Payer: Self-pay

## 2024-01-20 ENCOUNTER — Other Ambulatory Visit: Payer: Self-pay

## 2024-01-20 VITALS — BP 153/59 | HR 71 | Resp 18 | Ht 65.0 in | Wt 141.9 lb

## 2024-01-20 DIAGNOSIS — Z01810 Encounter for preprocedural cardiovascular examination: Secondary | ICD-10-CM | POA: Insufficient documentation

## 2024-01-20 DIAGNOSIS — E1159 Type 2 diabetes mellitus with other circulatory complications: Secondary | ICD-10-CM | POA: Insufficient documentation

## 2024-01-20 DIAGNOSIS — E1142 Type 2 diabetes mellitus with diabetic polyneuropathy: Secondary | ICD-10-CM | POA: Diagnosis not present

## 2024-01-20 DIAGNOSIS — I152 Hypertension secondary to endocrine disorders: Secondary | ICD-10-CM | POA: Insufficient documentation

## 2024-01-20 DIAGNOSIS — Z794 Long term (current) use of insulin: Secondary | ICD-10-CM | POA: Insufficient documentation

## 2024-01-21 ENCOUNTER — Telehealth: Payer: Self-pay | Admitting: Family

## 2024-01-21 NOTE — Telephone Encounter (Signed)
Left message to schedule diabetic eye exam 

## 2024-01-22 ENCOUNTER — Ambulatory Visit (HOSPITAL_COMMUNITY): Admitting: Anesthesiology

## 2024-01-22 ENCOUNTER — Encounter (HOSPITAL_COMMUNITY)
Admission: RE | Disposition: A | Discharge status: Home / Self Care | Payer: Self-pay | Source: Home / Self Care | Attending: Gastroenterology

## 2024-01-22 ENCOUNTER — Ambulatory Visit (HOSPITAL_COMMUNITY)
Admission: RE | Admit: 2024-01-22 | Discharge: 2024-01-22 | Disposition: A | Discharge status: Home / Self Care | Attending: Gastroenterology | Admitting: Gastroenterology

## 2024-01-22 ENCOUNTER — Other Ambulatory Visit: Payer: Self-pay

## 2024-01-22 ENCOUNTER — Encounter (HOSPITAL_COMMUNITY): Payer: Self-pay | Admitting: Gastroenterology

## 2024-01-22 DIAGNOSIS — K648 Other hemorrhoids: Secondary | ICD-10-CM | POA: Insufficient documentation

## 2024-01-22 DIAGNOSIS — J449 Chronic obstructive pulmonary disease, unspecified: Secondary | ICD-10-CM | POA: Diagnosis not present

## 2024-01-22 DIAGNOSIS — D122 Benign neoplasm of ascending colon: Secondary | ICD-10-CM

## 2024-01-22 DIAGNOSIS — R1310 Dysphagia, unspecified: Secondary | ICD-10-CM | POA: Diagnosis present

## 2024-01-22 DIAGNOSIS — K3189 Other diseases of stomach and duodenum: Secondary | ICD-10-CM | POA: Diagnosis not present

## 2024-01-22 DIAGNOSIS — N400 Enlarged prostate without lower urinary tract symptoms: Secondary | ICD-10-CM | POA: Diagnosis not present

## 2024-01-22 DIAGNOSIS — D120 Benign neoplasm of cecum: Secondary | ICD-10-CM | POA: Diagnosis not present

## 2024-01-22 DIAGNOSIS — K2970 Gastritis, unspecified, without bleeding: Secondary | ICD-10-CM | POA: Diagnosis not present

## 2024-01-22 DIAGNOSIS — Z1211 Encounter for screening for malignant neoplasm of colon: Secondary | ICD-10-CM

## 2024-01-22 DIAGNOSIS — Z7985 Long-term (current) use of injectable non-insulin antidiabetic drugs: Secondary | ICD-10-CM | POA: Insufficient documentation

## 2024-01-22 DIAGNOSIS — K222 Esophageal obstruction: Secondary | ICD-10-CM | POA: Diagnosis not present

## 2024-01-22 DIAGNOSIS — Z794 Long term (current) use of insulin: Secondary | ICD-10-CM | POA: Insufficient documentation

## 2024-01-22 DIAGNOSIS — D123 Benign neoplasm of transverse colon: Secondary | ICD-10-CM

## 2024-01-22 DIAGNOSIS — I10 Essential (primary) hypertension: Secondary | ICD-10-CM | POA: Diagnosis not present

## 2024-01-22 DIAGNOSIS — K5900 Constipation, unspecified: Secondary | ICD-10-CM | POA: Insufficient documentation

## 2024-01-22 DIAGNOSIS — K635 Polyp of colon: Secondary | ICD-10-CM

## 2024-01-22 DIAGNOSIS — G8929 Other chronic pain: Secondary | ICD-10-CM | POA: Diagnosis not present

## 2024-01-22 DIAGNOSIS — K219 Gastro-esophageal reflux disease without esophagitis: Secondary | ICD-10-CM | POA: Diagnosis not present

## 2024-01-22 DIAGNOSIS — E782 Mixed hyperlipidemia: Secondary | ICD-10-CM | POA: Diagnosis not present

## 2024-01-22 DIAGNOSIS — E119 Type 2 diabetes mellitus without complications: Secondary | ICD-10-CM | POA: Insufficient documentation

## 2024-01-22 DIAGNOSIS — Z7984 Long term (current) use of oral hypoglycemic drugs: Secondary | ICD-10-CM | POA: Insufficient documentation

## 2024-01-22 HISTORY — PX: ESOPHAGOGASTRODUODENOSCOPY: SHX5428

## 2024-01-22 HISTORY — PX: COLONOSCOPY: SHX5424

## 2024-01-22 LAB — GLUCOSE, CAPILLARY: Glucose-Capillary: 85 mg/dL (ref 70–99)

## 2024-01-22 SURGERY — COLONOSCOPY
Anesthesia: General

## 2024-01-22 MED ORDER — PHENYLEPHRINE 80 MCG/ML (10ML) SYRINGE FOR IV PUSH (FOR BLOOD PRESSURE SUPPORT)
PREFILLED_SYRINGE | INTRAVENOUS | Status: DC | PRN
  Administered 2024-01-22: 160 ug via INTRAVENOUS
  Administered 2024-01-22: 80 ug via INTRAVENOUS
  Administered 2024-01-22 (×2): 160 ug via INTRAVENOUS

## 2024-01-22 MED ORDER — PROPOFOL 10 MG/ML IV BOLUS
INTRAVENOUS | Status: DC | PRN
  Administered 2024-01-22: 50 mg via INTRAVENOUS
  Administered 2024-01-22: 100 mg via INTRAVENOUS

## 2024-01-22 MED ORDER — LIDOCAINE 2% (20 MG/ML) 5 ML SYRINGE
INTRAMUSCULAR | Status: DC | PRN
  Administered 2024-01-22: 100 mg via INTRAVENOUS

## 2024-01-22 MED ORDER — LACTATED RINGERS IV SOLN: INTRAVENOUS | Status: DC

## 2024-01-22 MED ORDER — PROPOFOL 500 MG/50ML IV EMUL
INTRAVENOUS | Status: DC | PRN
  Administered 2024-01-22: 150 ug/kg/min via INTRAVENOUS

## 2024-01-22 MED ORDER — PHENYLEPHRINE 80 MCG/ML (10ML) SYRINGE FOR IV PUSH (FOR BLOOD PRESSURE SUPPORT)
PREFILLED_SYRINGE | INTRAVENOUS | Status: AC
  Filled 2024-01-22: qty 10

## 2024-01-22 NOTE — Interval H&P Note (Signed)
 History and Physical Interval Note:  01/22/2024 12:32 PM  Jimmy Craig  has presented today for surgery, with the diagnosis of screening,dysphagia.  The various methods of treatment have been discussed with the patient and family. After consideration of risks, benefits and other options for treatment, the patient has consented to  Procedure(s) with comments: COLONOSCOPY (N/A) - 2:00 pm, asa 3 EGD (ESOPHAGOGASTRODUODENOSCOPY) (N/A) as a surgical intervention.  The patient's history has been reviewed, patient examined, no change in status, stable for surgery.  I have reviewed the patient's chart and labs.  Questions were answered to the patient's satisfaction.    EGD with dilation and Colonoscopy   Hargis Lias

## 2024-01-22 NOTE — Discharge Instructions (Signed)

## 2024-01-22 NOTE — OR Nursing (Signed)
 Jimmy Craig was at [facility] on 01/22/24 and cannot return to work until [employer] on 01/23/24.

## 2024-01-22 NOTE — Anesthesia Preprocedure Evaluation (Addendum)
 Anesthesia Evaluation  Patient identified by MRN, date of birth, ID band Patient awake    Reviewed: Allergy & Precautions, H&P , NPO status , Patient's Chart, lab work & pertinent test results, reviewed documented beta blocker date and time   Airway Mallampati: II  TM Distance: >3 FB Neck ROM: full    Dental no notable dental hx. (+) Caps, Chipped,    Pulmonary neg pulmonary ROS, COPD   Pulmonary exam normal breath sounds clear to auscultation       Cardiovascular Exercise Tolerance: Good hypertension,  Rhythm:regular Rate:Normal     Neuro/Psych  Neuromuscular disease negative neurological ROS  negative psych ROS   GI/Hepatic negative GI ROS, Neg liver ROS,GERD  ,,  Endo/Other  diabetes    Renal/GU Renal diseasenegative Renal ROS  negative genitourinary   Musculoskeletal   Abdominal   Peds  Hematology negative hematology ROS (+) Blood dyscrasia, anemia   Anesthesia Other Findings Lower front teeth caps appear worn, ? Due to grinding teeth.  Reproductive/Obstetrics negative OB ROS                             Anesthesia Physical Anesthesia Plan  ASA: 3  Anesthesia Plan: General   Post-op Pain Management:    Induction:   PONV Risk Score and Plan: Propofol infusion  Airway Management Planned:   Additional Equipment:   Intra-op Plan:   Post-operative Plan:   Informed Consent: I have reviewed the patients History and Physical, chart, labs and discussed the procedure including the risks, benefits and alternatives for the proposed anesthesia with the patient or authorized representative who has indicated his/her understanding and acceptance.     Dental Advisory Given  Plan Discussed with: CRNA  Anesthesia Plan Comments:        Anesthesia Quick Evaluation

## 2024-01-22 NOTE — Transfer of Care (Signed)
 Immediate Anesthesia Transfer of Care Note  Patient: Jimmy Craig  Procedure(s) Performed: COLONOSCOPY EGD (ESOPHAGOGASTRODUODENOSCOPY)  Patient Location: Short Stay  Anesthesia Type:General  Level of Consciousness: drowsy  Airway & Oxygen Therapy: Patient Spontanous Breathing  Post-op Assessment: Report given to RN and Post -op Vital signs reviewed and stable  Post vital signs: Reviewed and stable  Last Vitals:  Vitals Value Taken Time  BP    Temp    Pulse    Resp    SpO2      Last Pain:  Vitals:   01/22/24 1259  TempSrc:   PainSc: 5       Patients Stated Pain Goal: 8 (01/22/24 1248)  Complications: No notable events documented.

## 2024-01-22 NOTE — Anesthesia Procedure Notes (Signed)
 Date/Time: 01/22/2024 12:56 PM  Performed by: Sherwin Donate, CRNAPre-anesthesia Checklist: Emergency Drugs available, Patient identified, Suction available and Patient being monitored Patient Re-evaluated:Patient Re-evaluated prior to induction Oxygen Delivery Method: Nasal cannula Induction Type: IV induction Placement Confirmation: positive ETCO2 Comments: Optiflow High Flow [place] O2 used.

## 2024-01-22 NOTE — Op Note (Signed)
 [facility] Patient Name: Jimmy Craig Procedure Date: 01/22/2024 12:33 PM MRN: [id number] Date of Birth: [birth year] Attending MD: Terril Fetters , MD, [id number] CSN: [id number] Age: 73 Admit Type: Outpatient Procedure:                Colonoscopy Indications:              Screening for colorectal malignant neoplasm Providers:                Terril Fetters, MD, Willena Harp, Theola Fitch Referring MD:              Medicines:                Monitored Anesthesia Care Complications:            No immediate complications. Estimated Blood Loss:     Estimated blood loss: none. Procedure:                Pre-Anesthesia Assessment:                           - Prior to the procedure, a History and Physical                            was performed, and patient medications and                            allergies were reviewed. The patient's tolerance of                            previous anesthesia was also reviewed. The risks                            and benefits of the procedure and the sedation                            options and risks were discussed with the patient.                            All questions were answered, and informed consent                            was obtained. Prior Anticoagulants: The patient has                            taken Plavix  (clopidogrel ), last dose was 3 days                            prior to procedure. ASA Grade Assessment: II - A                            patient with mild systemic disease. After reviewing                            the risks and benefits, the patient was deemed in  satisfactory condition to undergo the procedure.                           After obtaining informed consent, the colonoscope                            was passed under direct vision. Throughout the                            procedure, the patient's blood pressure, pulse, and                            oxygen saturations were  monitored continuously. The                            [id number]) scope was introduced through the                            anus and advanced to the the cecum, identified by                            appendiceal orifice and ileocecal valve. The                            colonoscopy was performed without difficulty. The                            patient tolerated the procedure well. The quality                            of the bowel preparation was evaluated using the                            BBPS [facility] Bowel Preparation Scale) with scores                            of: Right Colon = 3, Transverse Colon = 3 and Left                            Colon = 3 (entire mucosa seen well with no residual                            staining, small fragments of stool or opaque                            liquid). The total BBPS score equals 9. The                            ileocecal valve, appendiceal orifice, and rectum                            were photographed. Scope In: 1:13:02 PM Scope Out: 1:29:54 PM Scope Withdrawal Time: 0 hours 13 minutes 38  seconds  Total Procedure Duration: 0 hours 16 minutes 52 seconds  Findings:      Four sessile polyps were found in the hepatic flexure, ascending colon       and cecum. The polyps were 3 to 5 mm in size. These polyps were removed       with a cold snare. Resection and retrieval were complete.      Non-bleeding internal hemorrhoids were found during retroflexion. The       hemorrhoids were small. Impression:               - Four 3 to 5 mm polyps at the hepatic flexure, in                            the ascending colon and in the cecum, removed with                            a cold snare. Resected and retrieved.                           - Non-bleeding internal hemorrhoids. Moderate Sedation:      Per Anesthesia Care Recommendation:           - Patient has a contact number available for                            emergencies. The  signs and symptoms of potential                            delayed complications were discussed with the                            patient. Return to normal activities tomorrow.                            Written discharge instructions were provided to the                            patient.                           - Resume previous diet.                           - Continue present medications.                           - Await pathology results.                           - Repeat colonoscopy in 5-10 years for surveillance                            based on pathology results.                           - Return to primary care physician as previously  scheduled. Procedure Code(s):        --- Professional ---                           [phone], Colonoscopy, flexible; with removal of                            tumor(s), polyp(s), or other lesion(s) by snare                            technique Diagnosis Code(s):        --- Professional ---                           Z12.11, Encounter for screening for malignant                            neoplasm of colon                           D12.3, Benign neoplasm of transverse colon (hepatic                            flexure or splenic flexure)                           D12.2, Benign neoplasm of ascending colon                           D12.0, Benign neoplasm of cecum                           K64.8, Other hemorrhoids CPT copyright 2022 American Medical Association. All rights reserved. The codes documented in this report are preliminary and upon coder review may  be revised to meet current compliance requirements. Terril Fetters, MD Terril Fetters, MD 01/22/2024 1:41:41 PM This report has been signed electronically. Number of Addenda: 0

## 2024-01-22 NOTE — Op Note (Signed)
 [facility] Patient Name: Jimmy Craig Procedure Date: 01/22/2024 12:34 PM MRN: [id number] Date of Birth: 1950-10-08 Attending MD: Terril Fetters , MD, [id number] CSN: [id number] Age: 73 Admit Type: Outpatient Procedure:                Upper GI endoscopy Indications:              Dysphagia Providers:                Terril Fetters, MD, Willena Harp, Theola Fitch Referring MD:              Medicines:                Monitored Anesthesia Care Complications:            No immediate complications. Estimated Blood Loss:     Estimated blood loss was minimal. Procedure:                Pre-Anesthesia Assessment:                           - Prior to the procedure, a History and Physical                            was performed, and patient medications and                            allergies were reviewed. The patient's tolerance of                            previous anesthesia was also reviewed. The risks                            and benefits of the procedure and the sedation                            options and risks were discussed with the patient.                            All questions were answered, and informed consent                            was obtained. Prior Anticoagulants: The patient has                            taken Plavix  (clopidogrel ), last dose was 3 days                            prior to procedure. ASA Grade Assessment: II - A                            patient with mild systemic disease. After reviewing                            the risks and benefits, the patient was deemed in  satisfactory condition to undergo the procedure.                           After obtaining informed consent, the endoscope was                            passed under direct vision. Throughout the                            procedure, the patient's blood pressure, pulse, and                            oxygen saturations were monitored continuously.  The                            GIF-H190 ([id number]) scope was introduced through the                            mouth, and advanced to the second part of duodenum.                            The upper GI endoscopy was accomplished without                            difficulty. The patient tolerated the procedure                            well. Scope In: 1:02:26 PM Scope Out: 1:08:27 PM Total Procedure Duration: 0 hours 6 minutes 1 second  Findings:      A non-obstructing Schatzki ring was found in the lower third of the       esophagus. A TTS dilator was passed through the scope. Dilation with a       15-16.5-18 mm balloon dilator was performed to 18 mm. The dilation site       was examined and showed mild mucosal disruption. Biopsies were obtained       from the proximal and distal esophagus with cold forceps for histology       of suspected eosinophilic esophagitis.      Mildly erythematous mucosa without bleeding was found in the stomach.       This was biopsied with a cold forceps for histology.      The duodenal bulb and second portion of the duodenum were normal. Impression:               - Non-obstructing Schatzki ring. Dilated.                           - Erythematous mucosa in the stomach. Biopsied.                           - Normal duodenal bulb and second portion of the                            duodenum.                           -  Biopsies were taken with a cold forceps for                            evaluation of eosinophilic esophagitis. Moderate Sedation:      Per Anesthesia Care Recommendation:           - Patient has a contact number available for                            emergencies. The signs and symptoms of potential                            delayed complications were discussed with the                            patient. Return to normal activities tomorrow.                            Written discharge instructions were provided to the                             patient.                           - Resume previous diet.                           - Continue present medications.                           - Await pathology results. Procedure Code(s):        --- Professional ---                           [phone], Esophagogastroduodenoscopy, flexible,                            transoral; with transendoscopic balloon dilation of                            esophagus (less than 30 mm diameter)                           43239, 59, Esophagogastroduodenoscopy, flexible,                            transoral; with biopsy, single or multiple Diagnosis Code(s):        --- Professional ---                           K22.2, Esophageal obstruction                           K31.89, Other diseases of stomach and duodenum                           R13.10, Dysphagia, unspecified CPT copyright 2022 American Medical Association.  All rights reserved. The codes documented in this report are preliminary and upon coder review may  be revised to meet current compliance requirements. Terril Fetters, MD Terril Fetters, MD 01/22/2024 1:38:53 PM This report has been signed electronically. Number of Addenda: 0

## 2024-01-23 ENCOUNTER — Encounter (HOSPITAL_COMMUNITY): Payer: Self-pay | Admitting: Gastroenterology

## 2024-01-24 NOTE — Anesthesia Postprocedure Evaluation (Signed)
 Anesthesia Post Note  Patient: Jimmy Craig  Procedure(s) Performed: COLONOSCOPY EGD (ESOPHAGOGASTRODUODENOSCOPY)  Patient location during evaluation: Phase II Anesthesia Type: General Level of consciousness: awake Pain management: pain level controlled Vital Signs Assessment: post-procedure vital signs reviewed and stable Respiratory status: spontaneous breathing and respiratory function stable Cardiovascular status: blood pressure returned to baseline and stable Postop Assessment: no headache and no apparent nausea or vomiting Anesthetic complications: no Comments: Late entry   No notable events documented.   Last Vitals:  Vitals:   01/22/24 1248 01/22/24 1334  BP: (!) 159/72 (!) 110/45  Pulse: 75 (!) 57  Resp: 17 15  Temp: 36.8 C 36.5 C  SpO2: 100% 100%    Last Pain:  Vitals:   01/23/24 1352  TempSrc:   PainSc: 0-No pain                 Yvonna JINNY Bosworth

## 2024-01-26 ENCOUNTER — Other Ambulatory Visit: Payer: Self-pay | Admitting: Family

## 2024-01-26 DIAGNOSIS — Z794 Long term (current) use of insulin: Secondary | ICD-10-CM

## 2024-01-26 LAB — SURGICAL PATHOLOGY

## 2024-01-27 ENCOUNTER — Ambulatory Visit (INDEPENDENT_AMBULATORY_CARE_PROVIDER_SITE_OTHER): Payer: Self-pay | Admitting: Gastroenterology

## 2024-01-27 NOTE — Progress Notes (Signed)
 5 yr TCS noted in recall Patient result letter mailed Patient's PCP is on EPIC

## 2024-03-04 ENCOUNTER — Encounter (INDEPENDENT_AMBULATORY_CARE_PROVIDER_SITE_OTHER): Payer: Self-pay | Admitting: Gastroenterology

## 2024-03-26 ENCOUNTER — Encounter: Payer: Self-pay | Admitting: Family

## 2024-03-26 ENCOUNTER — Ambulatory Visit: Admitting: Family

## 2024-03-26 ENCOUNTER — Encounter: Payer: Self-pay | Admitting: Radiology

## 2024-03-26 VITALS — BP 137/70 | HR 74 | Temp 97.9°F | Ht 65.0 in | Wt 143.6 lb

## 2024-03-26 DIAGNOSIS — M150 Primary generalized (osteo)arthritis: Secondary | ICD-10-CM

## 2024-03-26 DIAGNOSIS — G8929 Other chronic pain: Secondary | ICD-10-CM

## 2024-03-26 DIAGNOSIS — E1169 Type 2 diabetes mellitus with other specified complication: Secondary | ICD-10-CM | POA: Diagnosis not present

## 2024-03-26 DIAGNOSIS — J301 Allergic rhinitis due to pollen: Secondary | ICD-10-CM

## 2024-03-26 DIAGNOSIS — E1142 Type 2 diabetes mellitus with diabetic polyneuropathy: Secondary | ICD-10-CM

## 2024-03-26 DIAGNOSIS — J449 Chronic obstructive pulmonary disease, unspecified: Secondary | ICD-10-CM

## 2024-03-26 DIAGNOSIS — M5442 Lumbago with sciatica, left side: Secondary | ICD-10-CM

## 2024-03-26 DIAGNOSIS — E1159 Type 2 diabetes mellitus with other circulatory complications: Secondary | ICD-10-CM

## 2024-03-26 DIAGNOSIS — J0101 Acute recurrent maxillary sinusitis: Secondary | ICD-10-CM

## 2024-03-26 DIAGNOSIS — M25552 Pain in left hip: Secondary | ICD-10-CM

## 2024-03-26 DIAGNOSIS — N401 Enlarged prostate with lower urinary tract symptoms: Secondary | ICD-10-CM

## 2024-03-26 DIAGNOSIS — Z79899 Other long term (current) drug therapy: Secondary | ICD-10-CM

## 2024-03-26 DIAGNOSIS — Z794 Long term (current) use of insulin: Secondary | ICD-10-CM

## 2024-03-26 DIAGNOSIS — E1121 Type 2 diabetes mellitus with diabetic nephropathy: Secondary | ICD-10-CM

## 2024-03-26 DIAGNOSIS — I152 Hypertension secondary to endocrine disorders: Secondary | ICD-10-CM

## 2024-03-26 DIAGNOSIS — K219 Gastro-esophageal reflux disease without esophagitis: Secondary | ICD-10-CM

## 2024-03-26 LAB — BAYER DCA HB A1C WAIVED: HB A1C (BAYER DCA - WAIVED): 7.5 % — ABNORMAL HIGH (ref 4.8–5.6)

## 2024-03-26 MED ORDER — AMOXICILLIN-POT CLAVULANATE 875-125 MG PO TABS: 1.0000 | ORAL_TABLET | Freq: Two times a day (BID) | ORAL | 0 refills | Status: DC

## 2024-03-26 MED ORDER — TRAMADOL HCL 50 MG PO TABS: 100.0000 mg | ORAL_TABLET | Freq: Two times a day (BID) | ORAL | 2 refills | Status: DC

## 2024-03-26 MED ORDER — CLOPIDOGREL BISULFATE 75 MG PO TABS: 75.0000 mg | ORAL_TABLET | Freq: Every day | ORAL | 1 refills | Status: AC

## 2024-03-26 MED ORDER — BACLOFEN 10 MG PO TABS: 10.0000 mg | ORAL_TABLET | Freq: Three times a day (TID) | ORAL | 2 refills | Status: DC

## 2024-03-26 MED ORDER — FLUTICASONE PROPIONATE 50 MCG/ACT NA SUSP
2.0000 | Freq: Every day | NASAL | 1 refills | Status: AC
Start: 2024-03-26 — End: ?

## 2024-03-26 MED ORDER — PREGABALIN 150 MG PO CAPS: 150.0000 mg | ORAL_CAPSULE | Freq: Two times a day (BID) | ORAL | 2 refills | Status: DC

## 2024-03-26 MED ORDER — PANTOPRAZOLE SODIUM 40 MG PO TBEC: 40.0000 mg | DELAYED_RELEASE_TABLET | Freq: Every day | ORAL | 3 refills | Status: DC

## 2024-03-26 MED ORDER — ATORVASTATIN CALCIUM 40 MG PO TABS
40.0000 mg | ORAL_TABLET | Freq: Every day | ORAL | 1 refills | Status: AC
Start: 2024-03-26 — End: ?

## 2024-03-26 MED ORDER — FARXIGA 10 MG PO TABS: 10.0000 mg | ORAL_TABLET | Freq: Every day | ORAL | 0 refills | Status: DC

## 2024-03-26 MED ORDER — METFORMIN HCL 1000 MG PO TABS: 1000.0000 mg | ORAL_TABLET | Freq: Two times a day (BID) | ORAL | 0 refills | Status: DC

## 2024-03-26 MED ORDER — CELECOXIB 200 MG PO CAPS: 200.0000 mg | ORAL_CAPSULE | Freq: Every day | ORAL | 0 refills | Status: DC

## 2024-03-26 MED ORDER — TAMSULOSIN HCL 0.4 MG PO CAPS: 0.4000 mg | ORAL_CAPSULE | Freq: Two times a day (BID) | ORAL | 3 refills | Status: AC

## 2024-03-26 NOTE — Patient Instructions (Signed)

## 2024-03-26 NOTE — Progress Notes (Signed)
 Subjective:    Patient ID: Jimmy Craig, male    DOB: [birth year], 73 y.o.   MRN: [id number]  Chief Complaint  Patient presents with   Medical Management of Chronic Issues   Pt presents to the office today for chronic follow up.   He has chronic back pain and has seen Neurosurgeon in the past. He had lumbar surgery on  11/22/20.  He had a MRI in 01/2020. He reports his leg pain is a pain of 4 out 10 when walking but resolves when resting. He completed PT. He takes Lyrica  150 mg BID.    He is followed by Ortho and had a steroid injection in left hip that did not help.    He has COPD with intermittent SOB and congestion. He recently got back from [place] and has a lot of smoke and fog that caused increased SOB.   Hypertension This is a chronic problem. The current episode started more than 1 year ago. The problem has been resolved since onset. The problem is controlled. Associated symptoms include blurred vision, headaches, malaise/fatigue and peripheral edema. Pertinent negatives include no shortness of breath. Risk factors for coronary artery disease include diabetes mellitus, dyslipidemia, male gender and sedentary lifestyle. The current treatment provides moderate improvement.  Gastroesophageal Reflux He complains of belching, heartburn and a hoarse voice. He reports no sore throat. This is a chronic problem. The current episode started more than 1 year ago. The problem occurs occasionally. The symptoms are aggravated by certain foods. He has tried a PPI for the symptoms. The treatment provided moderate relief.  Hyperlipidemia This is a chronic problem. The current episode started more than 1 year ago. The problem is uncontrolled. Recent lipid tests were reviewed and are high. Pertinent negatives include no shortness of breath. Current antihyperlipidemic treatment includes statins. The current treatment provides moderate improvement of lipids. Risk factors for coronary artery disease  include dyslipidemia, diabetes mellitus, hypertension, male sex and a sedentary lifestyle.  Diabetes He presents for his follow-up diabetic visit. He has type 2 diabetes mellitus. Hypoglycemia symptoms include headaches. Associated symptoms include blurred vision and foot paresthesias. Diabetic complications include peripheral neuropathy. Risk factors for coronary artery disease include dyslipidemia, diabetes mellitus, hypertension, male sex and sedentary lifestyle. He is following a generally healthy diet. His overall blood glucose range is 110-130 mg/dl. Eye exam is current.  Back Pain This is a chronic problem. The current episode started more than 1 year ago. The problem occurs intermittently. The pain is present in the lumbar spine. The quality of the pain is described as aching. The pain is at a severity of 5/10. The pain is moderate. The symptoms are aggravated by standing and twisting. Associated symptoms include headaches, numbness and tingling. He has tried home exercises and analgesics for the symptoms. The treatment provided mild relief.  Arthritis Presents for follow-up visit. He complains of pain and stiffness. Affected locations include the right knee, left knee and left hip. His pain is at a severity of 3/10 (when walking).  Benign Prostatic Hypertrophy This is a chronic problem. The current episode started more than 1 year ago. Irritative symptoms include nocturia (2-3). Past treatments include tamsulosin . The treatment provided mild relief.  Sinusitis This is a recurrent problem. The current episode started 1 to 4 weeks ago. The problem has been gradually worsening since onset. There has been no fever. His pain is at a severity of 5/10. The pain is mild. Associated symptoms include congestion, ear pain (right), headaches, a  hoarse voice and sinus pressure. Pertinent negatives include no shortness of breath or sore throat. Past treatments include oral decongestants. The treatment provided  mild relief.    Current opioids rx- Ultram  50 mg and Lyrica  # meds rx- 120, 60 Effectiveness of current meds-stable Adverse reactions from pain meds-none Morphine equivalent- 20  Pill count performed-No Last drug screen - 03/26/24 ( high risk q34m, moderate risk q29m, low risk yearly ) Urine drug screen today- Yes Was the [facility] reviewed- yes  If yes were their any concerning findings? - none  Pain contract signed on:03/26/24    Review of Systems  Constitutional:  Positive for malaise/fatigue.  HENT:  Positive for congestion, ear pain (right), hoarse voice and sinus pressure. Negative for sore throat.   Eyes:  Positive for blurred vision.  Respiratory:  Negative for shortness of breath.   Gastrointestinal:  Positive for heartburn.  Genitourinary:  Positive for nocturia (2-3).  Musculoskeletal:  Positive for back pain and stiffness.  Neurological:  Positive for tingling, numbness and headaches.  All other systems reviewed and are negative.  Family History  Problem Relation Age of Onset   Diabetes Mother    Social History   Socioeconomic History   Marital status: Married    Spouse name: Not on file   Number of children: Not on file   Years of education: Not on file   Highest education level: Master's degree (e.g., MA, MS, MEng, MEd, MSW, MBA)  Occupational History   Not on file  Tobacco Use   Smoking status: Never   Smokeless tobacco: Never  Vaping Use   Vaping status: Never Used  Substance and Sexual Activity   Alcohol use: No   Drug use: No   Sexual activity: Yes    Birth control/protection: None  Other Topics Concern   Not on file  Social History Narrative   Not on file   Social Drivers of Health   Financial Resource Strain: Medium Risk (12/03/2022)   Overall Financial Resource Strain (CARDIA)    Difficulty of Paying Living Expenses: Somewhat hard  Food Insecurity: No Food Insecurity (12/03/2022)   Hunger Vital Sign    Worried About Running Out of Food  in the Last Year: Never true    Ran Out of Food in the Last Year: Never true  Transportation Needs: No Transportation Needs (12/03/2022)   PRAPARE - Administrator, Civil Service (Medical): No    Lack of Transportation (Non-Medical): No  Physical Activity: Insufficiently Active (12/03/2022)   Exercise Vital Sign    Days of Exercise per Week: 3 days    Minutes of Exercise per Session: 10 min  Stress: Stress Concern Present (12/03/2022)   [employer] of Occupational Health - Occupational Stress Questionnaire    Feeling of Stress : To some extent  Social Connections: Moderately Integrated (12/03/2022)   Social Connection and Isolation Panel    Frequency of Communication with Friends and Family: Three times a week    Frequency of Social Gatherings with Friends and Family: Three times a week    Attends Religious Services: 1 to 4 times per year    Active Member of Clubs or Organizations: No    Attends Banker Meetings: Not on file    Marital Status: Married       Objective:   Physical Exam Vitals reviewed.  Constitutional:      General: He is not in acute distress.    Appearance: He is well-developed.  HENT:  Head: Normocephalic.     Right Ear: Tympanic membrane normal.     Left Ear: Tympanic membrane normal.     Nose:     Right Sinus: Maxillary sinus tenderness and frontal sinus tenderness present.  Eyes:     General:        Right eye: No discharge.        Left eye: No discharge.     Pupils: Pupils are equal, round, and reactive to light.  Neck:     Thyroid: No thyromegaly.  Cardiovascular:     Rate and Rhythm: Normal rate and regular rhythm.     Heart sounds: Normal heart sounds. No murmur heard. Pulmonary:     Effort: Pulmonary effort is normal. No respiratory distress.     Breath sounds: Wheezing present.  Abdominal:     General: Bowel sounds are normal. There is no distension.     Palpations: Abdomen is soft.     Tenderness: There is no  abdominal tenderness.  Musculoskeletal:        General: No tenderness.     Cervical back: Normal range of motion and neck supple.     Right lower leg: No edema.     Comments: Pain in lumbar with flexion and extension  Skin:    General: Skin is warm and dry.     Findings: No erythema or rash.  Neurological:     Mental Status: He is alert and oriented to person, place, and time.     Cranial Nerves: No cranial nerve deficit.     Deep Tendon Reflexes: Reflexes are normal and symmetric.  Psychiatric:        Behavior: Behavior normal.        Thought Content: Thought content normal.        Judgment: Judgment normal.       BP 137/70   Pulse 74   Temp 97.9 F (36.6 C) (Temporal)   Ht 5' 5 (1.651 m)   Wt 143 lb 9.6 oz (65.1 kg)   BMI 23.90 kg/m      Assessment & Plan:   Jimmy Craig comes in today with chief complaint of Medical Management of Chronic Issues   Diagnosis and orders addressed:  1. Hyperlipidemia associated with type 2 diabetes mellitus (HCC) - atorvastatin  (LIPITOR) 40 MG tablet; Take 1 tablet (40 mg total) by mouth daily.  Dispense: 90 tablet; Refill: 1 - CMP14+EGFR  2. Primary osteoarthritis involving multiple joints - celecoxib  (CELEBREX ) 200 MG capsule; Take 1 capsule (200 mg total) by mouth daily.  Dispense: 90 capsule; Refill: 0 - traMADol  (ULTRAM ) 50 MG tablet; Take 2 tablets (100 mg total) by mouth 2 (two) times daily.  Dispense: 120 tablet; Refill: 2 - CMP14+EGFR  3. Hip pain - baclofen  (LIORESAL ) 10 MG tablet; Take 1 tablet (10 mg total) by mouth 3 (three) times daily.  Dispense: 180 tablet; Refill: 2 - celecoxib  (CELEBREX ) 200 MG capsule; Take 1 capsule (200 mg total) by mouth daily.  Dispense: 90 capsule; Refill: 0 - CMP14+EGFR  4. Type 2 diabetes mellitus with diabetic polyneuropathy, with long-term current use of insulin  (HCC) (Primary) - FARXIGA  10 MG TABS tablet; Take 1 tablet (10 mg total) by mouth daily.  Dispense: 90 tablet; Refill:  0 - metFORMIN  (GLUCOPHAGE ) 1000 MG tablet; Take 1 tablet (1,000 mg total) by mouth 2 (two) times daily with a meal.  Dispense: 180 tablet; Refill: 0 - CMP14+EGFR - Bayer DCA Hb A1c Waived - Microalbumin / creatinine urine ratio  5. Allergic rhinitis due to pollen, unspecified seasonality - fluticasone  (FLONASE ) 50 MCG/ACT nasal spray; Place 2 sprays into both nostrils daily.  Dispense: 48 g; Refill: 1 - CMP14+EGFR  6. Chronic low back pain with left-sided sciatica, unspecified back pain laterality - pregabalin  (LYRICA ) 150 MG capsule; Take 1 capsule (150 mg total) by mouth 2 (two) times daily.  Dispense: 180 capsule; Refill: 2 - traMADol  (ULTRAM ) 50 MG tablet; Take 2 tablets (100 mg total) by mouth 2 (two) times daily.  Dispense: 120 tablet; Refill: 2 - CMP14+EGFR - ToxASSURE Select 13 (MW), Urine  7. Diabetic nephropathy associated with type 2 diabetes mellitus (HCC) - pregabalin  (LYRICA ) 150 MG capsule; Take 1 capsule (150 mg total) by mouth 2 (two) times daily.  Dispense: 180 capsule; Refill: 2 - CMP14+EGFR  8. Benign prostatic hyperplasia (BPH) with straining on urination - tamsulosin  (FLOMAX ) 0.4 MG CAPS capsule; Take 1 capsule (0.4 mg total) by mouth in the morning and at bedtime.  Dispense: 180 capsule; Refill: 3 - CMP14+EGFR  9. Hypertension associated with diabetes (HCC) - CMP14+EGFR  10. Acute recurrent maxillary sinusitis - CMP14+EGFR  11. GERD without esophagitis - pantoprazole  (PROTONIX ) 40 MG tablet; Take 1 tablet (40 mg total) by mouth daily.  Dispense: 60 tablet; Refill: 3 - CMP14+EGFR  12. Chronic obstructive pulmonary disease, unspecified COPD type (HCC) - CMP14+EGFR  13. Controlled substance agreement signed - ToxASSURE Select 13 (MW), Urine   Labs pending Patient reviewed in [place] controlled database, no flags noted. Contract and drug screen are up to date.  Will given Augmentin , continue zyrtec  and flonase  for sinusitis  Continue current medications   Health Maintenance reviewed Diet and exercise encouraged  Follow up plan: 3 months    Bari Learn, FNP

## 2024-03-26 NOTE — Addendum Note (Signed)
 Addended by: MICHELINE ROSINA FALCON on: 03/26/2024 02:56 PM   Modules accepted: Orders

## 2024-03-27 LAB — CMP14+EGFR
ALT: 30 IU/L (ref 0–44)
AST: 32 IU/L (ref 0–40)
Albumin: 4.1 g/dL (ref 3.8–4.8)
Alkaline Phosphatase: 103 IU/L (ref 44–121)
BUN/Creatinine Ratio: 29 — ABNORMAL HIGH (ref 10–24)
BUN: 24 mg/dL (ref 8–27)
Bilirubin Total: <0.2 mg/dL (ref 0.0–1.2)
CO2: 21 mmol/L (ref 20–29)
Calcium: 9.5 mg/dL (ref 8.6–10.2)
Chloride: 102 mmol/L (ref 96–106)
Creatinine, Ser: 0.83 mg/dL (ref 0.76–1.27)
Globulin, Total: 2.9 g/dL (ref 1.5–4.5)
Glucose: 179 mg/dL — ABNORMAL HIGH (ref 70–99)
Potassium: 4.6 mmol/L (ref 3.5–5.2)
Sodium: 139 mmol/L (ref 134–144)
Total Protein: 7 g/dL (ref 6.0–8.5)
eGFR: 94 mL/min/1.73 (ref >59)

## 2024-03-27 LAB — MICROALBUMIN / CREATININE URINE RATIO
Creatinine, Urine: 44.7 mg/dL
Microalb/Creat Ratio: 19 mg/g{creat} (ref 0–29)
Microalbumin, Urine: 8.7 ug/mL

## 2024-03-28 ENCOUNTER — Other Ambulatory Visit: Payer: Self-pay | Admitting: Family

## 2024-03-29 ENCOUNTER — Ambulatory Visit: Payer: Self-pay | Admitting: Family

## 2024-03-30 LAB — TOXASSURE SELECT 13 (MW), URINE

## 2024-03-31 ENCOUNTER — Telehealth: Payer: Self-pay

## 2024-03-31 DIAGNOSIS — K219 Gastro-esophageal reflux disease without esophagitis: Secondary | ICD-10-CM

## 2024-03-31 NOTE — Telephone Encounter (Signed)
 Please clarify whether he is to take protonix  once daily or twice daily you sent #[phone] over but the sig is once daily?

## 2024-03-31 NOTE — Telephone Encounter (Signed)
 Copied from CRM #[id number]. Topic: Clinical - Medication Question >> Mar 31, 2024 11:38 AM Willma SAUNDERS wrote: Reason for CRM: Patient states at his last appointment NP [facility] advised him to take his prescription for pantoprazole  (PROTONIX ) 40 MG tablet twice daily, once in the morning and once in the evening. When he picked his medication up the instructions state to take once daily. Patient would like to confirm how many times daily he should be taking this medication.  Patient can be reached at [phone]

## 2024-04-01 MED ORDER — PANTOPRAZOLE SODIUM 40 MG PO TBEC: 40.0000 mg | DELAYED_RELEASE_TABLET | Freq: Two times a day (BID) | ORAL | 3 refills | Status: AC

## 2024-04-01 NOTE — Telephone Encounter (Signed)
 Patient aware and verbalized understanding.

## 2024-04-01 NOTE — Addendum Note (Signed)
 Addended by: LAVELL LYE A on: 04/01/2024 01:35 PM   Modules accepted: Orders

## 2024-04-01 NOTE — Telephone Encounter (Signed)
 Protonix  BID Prescription sent to pharmacy

## 2024-04-22 ENCOUNTER — Other Ambulatory Visit: Payer: Self-pay | Admitting: Family

## 2024-05-20 ENCOUNTER — Other Ambulatory Visit: Payer: Self-pay | Admitting: Family

## 2024-05-20 DIAGNOSIS — E1142 Type 2 diabetes mellitus with diabetic polyneuropathy: Secondary | ICD-10-CM

## 2024-06-07 ENCOUNTER — Encounter: Payer: Self-pay | Admitting: Radiology

## 2024-06-24 ENCOUNTER — Other Ambulatory Visit: Payer: Self-pay | Admitting: Family

## 2024-06-24 DIAGNOSIS — G8929 Other chronic pain: Secondary | ICD-10-CM

## 2024-06-24 DIAGNOSIS — E1142 Type 2 diabetes mellitus with diabetic polyneuropathy: Secondary | ICD-10-CM

## 2024-06-24 DIAGNOSIS — J189 Pneumonia, unspecified organism: Secondary | ICD-10-CM

## 2024-06-24 DIAGNOSIS — J449 Chronic obstructive pulmonary disease, unspecified: Secondary | ICD-10-CM

## 2024-06-24 DIAGNOSIS — M150 Primary generalized (osteo)arthritis: Secondary | ICD-10-CM

## 2024-06-26 ENCOUNTER — Other Ambulatory Visit: Payer: Self-pay | Admitting: Family

## 2024-06-26 DIAGNOSIS — M150 Primary generalized (osteo)arthritis: Secondary | ICD-10-CM

## 2024-06-26 DIAGNOSIS — G8929 Other chronic pain: Secondary | ICD-10-CM

## 2024-06-28 ENCOUNTER — Ambulatory Visit

## 2024-06-28 ENCOUNTER — Encounter: Payer: Self-pay | Admitting: Family

## 2024-06-28 ENCOUNTER — Ambulatory Visit: Payer: Self-pay | Admitting: Family

## 2024-06-28 VITALS — BP 134/72 | HR 82 | Temp 97.5°F | Ht 65.0 in | Wt 144.8 lb

## 2024-06-28 DIAGNOSIS — R252 Cramp and spasm: Secondary | ICD-10-CM

## 2024-06-28 DIAGNOSIS — E1159 Type 2 diabetes mellitus with other circulatory complications: Secondary | ICD-10-CM

## 2024-06-28 DIAGNOSIS — E1142 Type 2 diabetes mellitus with diabetic polyneuropathy: Secondary | ICD-10-CM

## 2024-06-28 DIAGNOSIS — I152 Hypertension secondary to endocrine disorders: Secondary | ICD-10-CM

## 2024-06-28 DIAGNOSIS — D509 Iron deficiency anemia, unspecified: Secondary | ICD-10-CM

## 2024-06-28 DIAGNOSIS — R051 Acute cough: Secondary | ICD-10-CM

## 2024-06-28 DIAGNOSIS — M5442 Lumbago with sciatica, left side: Secondary | ICD-10-CM

## 2024-06-28 DIAGNOSIS — J449 Chronic obstructive pulmonary disease, unspecified: Secondary | ICD-10-CM

## 2024-06-28 DIAGNOSIS — E1169 Type 2 diabetes mellitus with other specified complication: Secondary | ICD-10-CM

## 2024-06-28 DIAGNOSIS — K219 Gastro-esophageal reflux disease without esophagitis: Secondary | ICD-10-CM

## 2024-06-28 DIAGNOSIS — G8929 Other chronic pain: Secondary | ICD-10-CM

## 2024-06-28 DIAGNOSIS — M25552 Pain in left hip: Secondary | ICD-10-CM

## 2024-06-28 DIAGNOSIS — M150 Primary generalized (osteo)arthritis: Secondary | ICD-10-CM

## 2024-06-28 DIAGNOSIS — Z794 Long term (current) use of insulin: Secondary | ICD-10-CM

## 2024-06-28 DIAGNOSIS — M48061 Spinal stenosis, lumbar region without neurogenic claudication: Secondary | ICD-10-CM

## 2024-06-28 DIAGNOSIS — E1121 Type 2 diabetes mellitus with diabetic nephropathy: Secondary | ICD-10-CM

## 2024-06-28 LAB — BAYER DCA HB A1C WAIVED: HB A1C (BAYER DCA - WAIVED): 7 % — ABNORMAL HIGH (ref 4.8–5.6)

## 2024-06-28 MED ORDER — FLUTICASONE-SALMETEROL 100-50 MCG/ACT IN AEPB: 1.0000 | INHALATION_SPRAY | Freq: Two times a day (BID) | RESPIRATORY_TRACT | 3 refills | Status: AC

## 2024-06-28 MED ORDER — PREGABALIN 150 MG PO CAPS: 150.0000 mg | ORAL_CAPSULE | Freq: Two times a day (BID) | ORAL | 2 refills | Status: AC

## 2024-06-28 MED ORDER — LISINOPRIL 10 MG PO TABS: 10.0000 mg | ORAL_TABLET | Freq: Every day | ORAL | 3 refills | Status: AC

## 2024-06-28 MED ORDER — DOXYCYCLINE MONOHYDRATE 100 MG PO TABS: 100.0000 mg | ORAL_TABLET | Freq: Two times a day (BID) | ORAL | 0 refills | Status: AC

## 2024-06-28 MED ORDER — BP MONITOR/MULTI-USER/ARM DEVI: 1.0000 | Freq: Every day | 0 refills | Status: AC

## 2024-06-28 MED ORDER — METFORMIN HCL 1000 MG PO TABS: 1000.0000 mg | ORAL_TABLET | Freq: Two times a day (BID) | ORAL | 0 refills | Status: AC

## 2024-06-28 MED ORDER — TRAMADOL HCL 50 MG PO TABS: 100.0000 mg | ORAL_TABLET | Freq: Two times a day (BID) | ORAL | 2 refills | Status: AC

## 2024-06-28 MED ORDER — BACLOFEN 10 MG PO TABS: 10.0000 mg | ORAL_TABLET | Freq: Three times a day (TID) | ORAL | 2 refills | Status: AC | PRN

## 2024-06-28 NOTE — Progress Notes (Signed)
 Subjective:    Patient ID: Jimmy Craig, male    DOB: 1951-05-15, 73 y.o.   MRN: [id number]  Chief Complaint  Patient presents with   Medical Management of Chronic Issues    Right nostril stopped up, mouth dry knots in both calfs, wants BP monitor, mucus with cough    Pt presents to the office today for chronic follow up.   He has chronic back pain and has seen Neurosurgeon in the past. He had lumbar surgery on  11/22/20.  He had a MRI in 01/2020. He reports his leg pain is a pain of 6 out 10 when walking but resolves when resting. He completed PT. He takes Lyrica  150 mg BID.   Complaining of bilateral leg cramps that come and go. Reports this pain can be a 9 out 10.   He is followed by Ortho and had a steroid injection in left hip that did not help.    He has COPD with intermittent SOB when walking and congestion. Using Advair with mild relief.   Hypertension This is a chronic problem. The current episode started more than 1 year ago. The problem has been resolved since onset. The problem is controlled. Associated symptoms include blurred vision, malaise/fatigue, peripheral edema (slightly) and shortness of breath. Risk factors for coronary artery disease include diabetes mellitus, dyslipidemia, male gender and sedentary lifestyle. The current treatment provides moderate improvement.  Gastroesophageal Reflux He complains of belching, coughing and heartburn. This is a chronic problem. The current episode started more than 1 year ago. The problem occurs occasionally. The symptoms are aggravated by certain foods. He has tried a PPI for the symptoms. The treatment provided moderate relief.  Hyperlipidemia This is a chronic problem. The current episode started more than 1 year ago. The problem is uncontrolled (LDL improved). Recent lipid tests were reviewed and are high. Associated symptoms include shortness of breath. Current antihyperlipidemic treatment includes statins. The current  treatment provides moderate improvement of lipids. Risk factors for coronary artery disease include dyslipidemia, diabetes mellitus, hypertension, male sex and a sedentary lifestyle.  Diabetes He presents for his follow-up diabetic visit. He has type 2 diabetes mellitus. Associated symptoms include blurred vision and foot paresthesias. Diabetic complications include peripheral neuropathy. Risk factors for coronary artery disease include dyslipidemia, diabetes mellitus, hypertension, male sex and sedentary lifestyle. He is following a generally healthy diet. His overall blood glucose range is 110-130 mg/dl. Eye exam is current.  Back Pain This is a chronic problem. The current episode started more than 1 year ago. The problem occurs intermittently. The pain is present in the lumbar spine. The quality of the pain is described as aching. The pain is at a severity of 6/10. The pain is moderate. The symptoms are aggravated by standing and twisting. Associated symptoms include numbness and tingling. He has tried home exercises and analgesics for the symptoms. The treatment provided mild relief.  Arthritis Presents for follow-up visit. He complains of pain and stiffness. Affected locations include the right knee, left knee, left hip and right hip. His pain is at a severity of 4/10 (when walking).  Benign Prostatic Hypertrophy This is a chronic problem. The current episode started more than 1 year ago. Irritative symptoms include nocturia (2-3). Past treatments include tamsulosin . The treatment provided mild relief.  Cough This is a recurrent problem. The current episode started more than 1 month ago. The problem has been waxing and waning. The problem occurs every few minutes. The cough is Productive of purulent  sputum and productive of brown sputum. Associated symptoms include heartburn, nasal congestion and shortness of breath.    Current opioids rx- Ultram  50 mg and Lyrica  # meds rx- 120, 60 Effectiveness  of current meds-stable Adverse reactions from pain meds-none Morphine equivalent- 20  Pill count performed-No Last drug screen - 03/26/24 ( high risk q20m, moderate risk q73m, low risk yearly ) Urine drug screen today- Yes Was the [facility] reviewed- yes  If yes were their any concerning findings? - none  Pain contract signed on:03/26/24    Review of Systems  Constitutional:  Positive for malaise/fatigue.  Eyes:  Positive for blurred vision.  Respiratory:  Positive for cough and shortness of breath.   Gastrointestinal:  Positive for heartburn.  Genitourinary:  Positive for nocturia (2-3).  Musculoskeletal:  Positive for back pain and stiffness.  Neurological:  Positive for tingling and numbness.   Family History  Problem Relation Age of Onset   Diabetes Mother    Social History   Socioeconomic History   Marital status: Married    Spouse name: Not on file   Number of children: Not on file   Years of education: Not on file   Highest education level: Master's degree (e.g., MA, MS, MEng, MEd, MSW, MBA)  Occupational History   Not on file  Tobacco Use   Smoking status: Never   Smokeless tobacco: Never  Vaping Use   Vaping status: Never Used  Substance and Sexual Activity   Alcohol use: No   Drug use: No   Sexual activity: Yes    Birth control/protection: None  Other Topics Concern   Not on file  Social History Narrative   Not on file   Social Drivers of Health   Financial Resource Strain: Medium Risk (12/03/2022)   Overall Financial Resource Strain (CARDIA)    Difficulty of Paying Living Expenses: Somewhat hard  Food Insecurity: No Food Insecurity (12/03/2022)   Hunger Vital Sign    Worried About Running Out of Food in the Last Year: Never true    Ran Out of Food in the Last Year: Never true  Transportation Needs: No Transportation Needs (12/03/2022)   PRAPARE - Administrator, Civil Service (Medical): No    Lack of Transportation (Non-Medical): No   Physical Activity: Insufficiently Active (12/03/2022)   Exercise Vital Sign    Days of Exercise per Week: 3 days    Minutes of Exercise per Session: 10 min  Stress: Stress Concern Present (12/03/2022)   [employer] of Occupational Health - Occupational Stress Questionnaire    Feeling of Stress : To some extent  Social Connections: Moderately Integrated (12/03/2022)   Social Connection and Isolation Panel    Frequency of Communication with Friends and Family: Three times a week    Frequency of Social Gatherings with Friends and Family: Three times a week    Attends Religious Services: 1 to 4 times per year    Active Member of Clubs or Organizations: No    Attends Banker Meetings: Not on file    Marital Status: Married       Objective:   Physical Exam Vitals reviewed.  Constitutional:      General: He is not in acute distress.    Appearance: He is well-developed.  HENT:     Head: Normocephalic.     Right Ear: Tympanic membrane normal.     Left Ear: Tympanic membrane normal.     Nose:     Right Turbinates:  Swollen.     Right Sinus: Maxillary sinus tenderness present. No frontal sinus tenderness.  Eyes:     General:        Right eye: No discharge.        Left eye: No discharge.     Pupils: Pupils are equal, round, and reactive to light.  Neck:     Thyroid: No thyromegaly.  Cardiovascular:     Rate and Rhythm: Normal rate and regular rhythm.     Heart sounds: Normal heart sounds. No murmur heard. Pulmonary:     Effort: Pulmonary effort is normal. No respiratory distress.     Breath sounds: No wheezing.  Abdominal:     General: Bowel sounds are normal. There is no distension.     Palpations: Abdomen is soft.     Tenderness: There is no abdominal tenderness.  Musculoskeletal:        General: No tenderness.     Cervical back: Normal range of motion and neck supple.     Right lower leg: No edema.     Comments: Pain in lumbar with flexion and extension   Skin:    General: Skin is warm and dry.     Findings: No erythema or rash.  Neurological:     Mental Status: He is alert and oriented to person, place, and time.     Cranial Nerves: No cranial nerve deficit.     Deep Tendon Reflexes: Reflexes are normal and symmetric.  Psychiatric:        Behavior: Behavior normal.        Thought Content: Thought content normal.        Judgment: Judgment normal.       BP 134/72   Pulse 82   Temp (!) 97.5 F (36.4 C) (Temporal)   Ht 5' 5 (1.651 m)   Wt 144 lb 12.8 oz (65.7 kg)   SpO2 96%   BMI 24.10 kg/m      Assessment & Plan:   Jimmy Craig comes in today with chief complaint of Medical Management of Chronic Issues (Right nostril stopped up, mouth dry knots in both calfs, wants BP monitor, mucus with cough )   Diagnosis and orders addressed:  1. Hypertension associated with diabetes (HCC) - lisinopril  (ZESTRIL ) 10 MG tablet; Take 1 tablet (10 mg total) by mouth daily.  Dispense: 90 tablet; Refill: 3 - Blood Pressure Monitoring (BP MONITOR/MULTI-USER/ARM) DEVI; 1 Device by Does not apply route daily.  Dispense: 1 each; Refill: 0 - CMP14+EGFR - CBC with Differential/Platelet  2. Type 2 diabetes mellitus with diabetic polyneuropathy, with long-term current use of insulin  (HCC) - metFORMIN  (GLUCOPHAGE ) 1000 MG tablet; Take 1 tablet (1,000 mg total) by mouth 2 (two) times daily with a meal.  Dispense: 180 tablet; Refill: 0 - CMP14+EGFR - CBC with Differential/Platelet - Bayer DCA Hb A1c Waived  3. Chronic obstructive pulmonary disease, unspecified COPD type (HCC) (Primary) - CMP14+EGFR - CBC with Differential/Platelet - DG Chest 2 View; Future - doxycycline  (ADOXA) 100 MG tablet; Take 1 tablet (100 mg total) by mouth 2 (two) times daily.  Dispense: 20 tablet; Refill: 0  4. GERD without esophagitis - CMP14+EGFR - CBC with Differential/Platelet  5. Hyperlipidemia associated with type 2 diabetes mellitus (HCC)  - CMP14+EGFR -  CBC with Differential/Platelet  6. Iron deficiency anemia, unspecified iron deficiency anemia type - CMP14+EGFR - CBC with Differential/Platelet  7. Chronic low back pain with left-sided sciatica, unspecified back pain laterality  - pregabalin  (LYRICA ) 150  MG capsule; Take 1 capsule (150 mg total) by mouth 2 (two) times daily.  Dispense: 180 capsule; Refill: 2 - traMADol  (ULTRAM ) 50 MG tablet; Take 2 tablets (100 mg total) by mouth 2 (two) times daily.  Dispense: 120 tablet; Refill: 2 - CMP14+EGFR - CBC with Differential/Platelet  8. Spinal stenosis of lumbar region, unspecified whether neurogenic claudication present - CMP14+EGFR - CBC with Differential/Platelet  9. Pain of left hip  - baclofen  (LIORESAL ) 10 MG tablet; Take 1 tablet (10 mg total) by mouth 3 (three) times daily as needed for muscle spasms.  Dispense: 180 tablet; Refill: 2 - CMP14+EGFR - CBC with Differential/Platelet  10. Leg cramp - baclofen  (LIORESAL ) 10 MG tablet; Take 1 tablet (10 mg total) by mouth 3 (three) times daily as needed for muscle spasms.  Dispense: 180 tablet; Refill: 2 - CMP14+EGFR - CBC with Differential/Platelet - Magnesium   11. Diabetic nephropathy associated with type 2 diabetes mellitus (HCC) - pregabalin  (LYRICA ) 150 MG capsule; Take 1 capsule (150 mg total) by mouth 2 (two) times daily.  Dispense: 180 capsule; Refill: 2 - CMP14+EGFR - CBC with Differential/Platelet  12. Primary osteoarthritis involving multiple joints - traMADol  (ULTRAM ) 50 MG tablet; Take 2 tablets (100 mg total) by mouth 2 (two) times daily.  Dispense: 120 tablet; Refill: 2 - CMP14+EGFR - CBC with Differential/Platelet  13. Acute cough - Take meds as prescribed - Use a cool mist humidifier  -Use saline nose sprays frequently -Force fluids -For any cough or congestion  Use plain Mucinex- regular strength or max strength is fine -For fever or aces or pains- take tylenol  or ibuprofen. -Throat lozenges if  help -Start doxycycline   - DG Chest 2 View; Future - doxycycline  (ADOXA) 100 MG tablet; Take 1 tablet (100 mg total) by mouth 2 (two) times daily.  Dispense: 20 tablet; Refill: 0   Labs pending Patient reviewed in [place] controlled database, no flags noted. Contract and drug screen are up to date.  Will given doxycyline, continue zyrtec  and flonase   Continue current medications  Health Maintenance reviewed Diet and exercise encouraged  Follow up plan: 3 months    Bari Learn, FNP

## 2024-06-28 NOTE — Patient Instructions (Signed)
 Leg Cramps: What They Mean Leg cramps happen when one or more muscles tighten and there's no control over it. They can happen during exercise or when you're resting. Leg cramps are painful and can last for a few seconds to minutes. They can also come back many times before stopping. Usually, leg cramps aren't caused by a serious medical problem. Often, the cause isn't known. Some common causes include: Problems with moving or not moving the body, like: Working your muscles too hard, such as during intense exercise. Doing the same motion over and over. Not warming up or stretching before playing sports or doing activities. Using the wrong technique or form when playing sports or doing activities. Staying in one position for a long time. Water or electrolyte balance issues, like: Not drinking enough fluids or being dehydrated. Getting sick from too much heat. Having low levels of minerals called electrolytes in your blood, like potassium and calcium . This can happen from: Pregnancy. Taking medicines that make you pee more, also called diuretic medicines. Not getting enough nutrients from your diet. Side effects of some medicines. Follow these instructions at home: Eating and drinking Eat and drink as told. Eat a healthy diet that includes plenty of nutrients to help your muscles work well. A healthy diet includes fruits and vegetables, lean protein, whole grains, and low-fat or nonfat dairy products. Drink enough fluids to keep your pee pale yellow. Drinking more water may help prevent cramps. Managing pain and muscle cramping     Massage, stretch, and relax the cramped muscle. Do this for several minutes at a time. Use ice or an ice pack as told. Place a towel between your skin and the ice. Leave the ice on for 20 minutes, 2-3 times a day. Use heat as told. Use the heat source that your provider recommends, such as a moist heat pack or a heating pad. Do this as often as told. Place a  towel between your skin and the heat source. Leave the heat on for 20-30 minutes. If your skin turns red, take off the ice or heat right away to prevent skin damage. The risk of damage is higher if you can't feel pain, heat, or cold. Take hot showers or baths to help relax tight muscles. General instructions If you're having a lot of leg cramps, avoid hard workouts for several days. Take supplements and medicines only as told. Contact a health care provider if: Your leg cramps get worse or happen more often. Your leg cramps don't get better over time. Your foot becomes cold, numb, or blue. This information is not intended to replace advice given to you by your health care provider. Make sure you discuss any questions you have with your health care provider. Document Revised: 07/11/2023 Document Reviewed: 04/02/2023 Elsevier Patient Education  2025 [employer].

## 2024-06-29 ENCOUNTER — Ambulatory Visit: Payer: Self-pay | Admitting: Family

## 2024-06-29 LAB — MAGNESIUM: Magnesium: 1.9 mg/dL (ref 1.6–2.3)

## 2024-06-29 LAB — CMP14+EGFR
ALT: 27 IU/L (ref 0–44)
AST: 21 IU/L (ref 0–40)
Albumin: 4.1 g/dL (ref 3.8–4.8)
Alkaline Phosphatase: 103 IU/L (ref 47–123)
BUN/Creatinine Ratio: 13 (ref 10–24)
BUN: 15 mg/dL (ref 8–27)
Bilirubin Total: 0.2 mg/dL (ref 0.0–1.2)
CO2: 22 mmol/L (ref 20–29)
Calcium: 9.4 mg/dL (ref 8.6–10.2)
Chloride: 104 mmol/L (ref 96–106)
Creatinine, Ser: 1.12 mg/dL (ref 0.76–1.27)
Globulin, Total: 3.1 g/dL (ref 1.5–4.5)
Glucose: 209 mg/dL — AB (ref 70–99)
Potassium: 4.6 mmol/L (ref 3.5–5.2)
Sodium: 141 mmol/L (ref 134–144)
Total Protein: 7.2 g/dL (ref 6.0–8.5)
eGFR: 70 mL/min/1.73 (ref >59)

## 2024-06-29 LAB — CBC WITH DIFFERENTIAL/PLATELET
Basophils Absolute: 0.1 x10E3/uL (ref 0.0–0.2)
Basos: 1 %
EOS (ABSOLUTE): 0.1 x10E3/uL (ref 0.0–0.4)
Eos: 3 %
Hematocrit: 37.8 % (ref 37.5–51.0)
Hemoglobin: 11.5 g/dL — ABNORMAL LOW (ref 13.0–17.7)
Immature Grans (Abs): 0 x10E3/uL (ref 0.0–0.1)
Immature Granulocytes: 0 %
Lymphocytes Absolute: 1 x10E3/uL (ref 0.7–3.1)
Lymphs: 22 %
MCH: 25.4 pg — ABNORMAL LOW (ref 26.6–33.0)
MCHC: 30.4 g/dL — ABNORMAL LOW (ref 31.5–35.7)
MCV: 84 fL (ref 79–97)
Monocytes Absolute: 0.5 x10E3/uL (ref 0.1–0.9)
Monocytes: 11 %
Neutrophils Absolute: 3 x10E3/uL (ref 1.4–7.0)
Neutrophils: 63 %
Platelets: 272 x10E3/uL (ref 150–450)
RBC: 4.52 x10E6/uL (ref 4.14–5.80)
RDW: 16.1 % — ABNORMAL HIGH (ref 11.6–15.4)
WBC: 4.8 x10E3/uL (ref 3.4–10.8)

## 2024-07-01 LAB — SPECIMEN STATUS REPORT

## 2024-07-01 LAB — VITAMIN B12: Vitamin B-12: 699 pg/mL (ref 232–1245)

## 2024-07-01 LAB — IRON: Iron: 31 ug/dL — AB (ref 38–169)

## 2024-07-07 ENCOUNTER — Other Ambulatory Visit: Payer: Self-pay | Admitting: Family

## 2024-07-07 DIAGNOSIS — R051 Acute cough: Secondary | ICD-10-CM

## 2024-07-07 DIAGNOSIS — J449 Chronic obstructive pulmonary disease, unspecified: Secondary | ICD-10-CM

## 2024-07-08 ENCOUNTER — Other Ambulatory Visit: Payer: Self-pay | Admitting: Family

## 2024-07-08 DIAGNOSIS — J449 Chronic obstructive pulmonary disease, unspecified: Secondary | ICD-10-CM

## 2024-07-08 DIAGNOSIS — R051 Acute cough: Secondary | ICD-10-CM

## 2024-07-14 ENCOUNTER — Other Ambulatory Visit: Payer: Self-pay | Admitting: Family

## 2024-07-14 DIAGNOSIS — M150 Primary generalized (osteo)arthritis: Secondary | ICD-10-CM

## 2024-07-14 DIAGNOSIS — J449 Chronic obstructive pulmonary disease, unspecified: Secondary | ICD-10-CM

## 2024-07-14 DIAGNOSIS — M25552 Pain in left hip: Secondary | ICD-10-CM

## 2024-07-14 DIAGNOSIS — R051 Acute cough: Secondary | ICD-10-CM

## 2024-07-22 ENCOUNTER — Other Ambulatory Visit: Payer: Self-pay | Admitting: Family

## 2024-08-07 ENCOUNTER — Other Ambulatory Visit: Payer: Self-pay | Admitting: Family

## 2024-08-19 ENCOUNTER — Other Ambulatory Visit: Payer: Self-pay | Admitting: Family

## 2024-08-19 DIAGNOSIS — J189 Pneumonia, unspecified organism: Secondary | ICD-10-CM

## 2024-09-06 NOTE — ED Triage Notes (Signed)
 C/o throat and body-ache (4/10) since this am.  SOB with walking.

## 2024-09-08 NOTE — ED Triage Notes (Addendum)
 Pt BIB EMS c/o weakness x 1 week. EMS reports syncopal episode. Family reports to EMS that pt passed out. EMS reports that on scene pt was unresponsive to painful stimuli. At time of triage, pt alert and able to answer questions appropriately. Denies head injury.   Pt reports that he slipped on his porch and fell down onto his buttock. Pt states that his walking stick slipped on the snow causing him to loose his balance and fall. Denies pain at time of triage.

## 2024-09-08 NOTE — ED Notes (Signed)
 Bed: 09 Expected date: 09/08/24 Expected time: 10:27 PM Means of arrival:  Comments: EMS

## 2024-09-08 NOTE — ED Notes (Signed)
 Patient states dizziness with position change during orthostatic vitals

## 2024-09-09 ENCOUNTER — Encounter (HOSPITAL_COMMUNITY): Payer: Self-pay

## 2024-10-01 ENCOUNTER — Ambulatory Visit: Admitting: Family
